# Patient Record
Sex: Male | Born: 1990 | Race: White | Hispanic: No | Marital: Single | Smoking: Former smoker
Health system: Southern US, Community
[De-identification: ages and names within clinical notes are randomized; demographics above are authoritative.]

## PROBLEM LIST (undated history)

## (undated) DIAGNOSIS — F32A Depression, unspecified: Secondary | ICD-10-CM

## (undated) DIAGNOSIS — F1990 Other psychoactive substance use, unspecified, uncomplicated: Secondary | ICD-10-CM

## (undated) DIAGNOSIS — F419 Anxiety disorder, unspecified: Secondary | ICD-10-CM

## (undated) HISTORY — DX: Anxiety disorder, unspecified: F41.9

## (undated) HISTORY — DX: Depression, unspecified: F32.A

## (undated) HISTORY — PX: SHOULDER SURGERY: SHX246

---

## 2018-06-29 ENCOUNTER — Encounter (HOSPITAL_COMMUNITY): Payer: Self-pay | Admitting: Emergency Medicine

## 2018-06-29 ENCOUNTER — Emergency Department (HOSPITAL_COMMUNITY)
Admission: EM | Admit: 2018-06-29 | Discharge: 2018-06-29 | Disposition: A | Discharge status: Home / Self Care | Payer: Self-pay | Attending: Emergency Medicine | Admitting: Emergency Medicine

## 2018-06-29 ENCOUNTER — Emergency Department (HOSPITAL_COMMUNITY): Payer: Self-pay

## 2018-06-29 DIAGNOSIS — R69 Illness, unspecified: Secondary | ICD-10-CM

## 2018-06-29 DIAGNOSIS — M7918 Myalgia, other site: Secondary | ICD-10-CM | POA: Insufficient documentation

## 2018-06-29 DIAGNOSIS — B9789 Other viral agents as the cause of diseases classified elsewhere: Secondary | ICD-10-CM

## 2018-06-29 DIAGNOSIS — R509 Fever, unspecified: Secondary | ICD-10-CM | POA: Insufficient documentation

## 2018-06-29 DIAGNOSIS — J111 Influenza due to unidentified influenza virus with other respiratory manifestations: Secondary | ICD-10-CM | POA: Insufficient documentation

## 2018-06-29 DIAGNOSIS — J069 Acute upper respiratory infection, unspecified: Secondary | ICD-10-CM | POA: Insufficient documentation

## 2018-06-29 MED ORDER — ACETAMINOPHEN 325 MG PO TABS
650.0000 mg | ORAL_TABLET | Freq: Once | ORAL | Status: AC
  Administered 2018-06-29: 650 mg via ORAL
  Filled 2018-06-29: qty 2

## 2018-06-29 MED ORDER — BENZONATATE 100 MG PO CAPS: 100.0000 mg | ORAL_CAPSULE | Freq: Three times a day (TID) | ORAL | 0 refills | Status: DC

## 2018-06-29 NOTE — Discharge Instructions (Signed)
Drink plenty of fluids. Take tylenol or motrin for pain or fever. Take tessalon for cough. Follow up with family doctor if not improving or return if worsening in 3-5 days.

## 2018-06-29 NOTE — ED Triage Notes (Signed)
Pt reports cough x1 week with cold sweats and body aches. Denies any N/V.

## 2018-06-29 NOTE — ED Notes (Signed)
Patient verbalizes understanding of discharge instructions. Opportunity for questioning and answers were provided. Armband removed by staff, pt discharged from ED ambulatory.   

## 2018-06-29 NOTE — ED Provider Notes (Signed)
[facility] Provider Note   CSN: [id number] Arrival date & time: 06/29/18  1429     History   Chief Complaint Chief Complaint  Patient presents with  . Cough    HPI Sheryn BisonGiancarlo Jayme CloudGonzalez is a 27 y.o. male.  HPI Sheryn BisonGiancarlo Jayme CloudGonzalez is a 27 y.o. male presents to emergency department with complaint of cough, body aches, chills.  Patient states that his symptoms began 1 week ago with mild cough.  He states gradually his cough has worsened and now he is having body aches, chills, sweats.  No recent travel.  Did not get his flu shot.  He states he has been taking Robitussin which has not helped.  He states that he does have chest tightness and some shortness of breath.  He states cough is nonproductive.  Denies any nasal congestion or sore throat.  No nausea, vomiting, diarrhea.  History reviewed. No pertinent past medical history.  There are no active problems to display for this patient.   History reviewed. No pertinent surgical history.      Home Medications    Prior to Admission medications   Not on File    Family History No family history on file.  Social History Social History   Tobacco Use  . Smoking status: Not on file  Substance Use Topics  . Alcohol use: Not on file  . Drug use: Not on file     Allergies   Patient has no allergy information on record.   Review of Systems Review of Systems  Constitutional: Positive for chills and fever.  HENT: Negative for congestion, rhinorrhea and sore throat.   Respiratory: Positive for cough and shortness of breath.   Cardiovascular: Negative for chest pain.  Gastrointestinal: Negative for abdominal pain, diarrhea, nausea and vomiting.  Skin: Negative for rash.     Physical Exam Updated Vital Signs BP (!) 147/105 (BP Location: Right Arm)   Pulse 94   Temp 99.9 F (37.7 C) (Oral)   Resp 17   SpO2 100%   Physical Exam Vitals signs and nursing note reviewed.    Constitutional:      General: He is not in acute distress.    Appearance: He is well-developed.  HENT:     Head: Normocephalic and atraumatic.  Eyes:     Conjunctiva/sclera: Conjunctivae normal.  Neck:     Musculoskeletal: Neck supple.  Cardiovascular:     Rate and Rhythm: Normal rate and regular rhythm.     Heart sounds: Normal heart sounds.  Pulmonary:     Effort: Pulmonary effort is normal. No respiratory distress.     Breath sounds: No wheezing or rales.  Abdominal:     General: Bowel sounds are normal. There is no distension.     Palpations: Abdomen is soft.     Tenderness: There is no abdominal tenderness. There is no rebound.  Skin:    General: Skin is warm and dry.  Neurological:     Mental Status: He is alert.      ED Treatments / Results  Labs (all labs ordered are listed, but only abnormal results are displayed) Labs Reviewed - No data to display  EKG None  Radiology No results found.  Procedures Procedures (including critical care time)  Medications Ordered in ED Medications - No data to display   Initial Impression / Assessment and Plan / ED Course  I have reviewed the triage vital signs and the nursing notes.  Pertinent labs & imaging  results that were available during my care of the patient were reviewed by me and considered in my medical decision making (see chart for details).     Patient in emergency department with cough, fever, chills, myalgias.  Did not get his flu shot this year.  I suspect patient may have influenza.  His symptoms have been going on for a week and are getting worse.  At this point in time I will get a chest x-ray to rule out pneumonia.  We will treat his low-grade fever with Tylenol.  3:13 PM Chest x-ray is negative.  Patient has flulike symptoms, discussed possibility of this being influenza versus a viral upper respiratory tract infection.  Discussed treatment as to symptomatic especially because patient is out of the  window for Tamiflu.  Discussed Tylenol and Motrin around-the-clock to keep his fever down.  Encouraged to drink plenty of fluids.  I will give him prescription for some Tessalon to take for cough.  Follow-up with family doctor return in 3 to 5 days.  Vitals:   06/29/18 1435  BP: (!) 147/105  Pulse: 94  Resp: 17  Temp: 99.9 F (37.7 C)  TempSrc: Oral  SpO2: 100%       Final Clinical Impressions(s) / ED Diagnoses   Final diagnoses:  Viral URI with cough  Influenza-like illness    ED Discharge Orders    None       Jaynie CrumbleKirichenko, Damacio Weisgerber, PA-C 06/29/18 1609    Tilden Fossaees, Elizabeth, MD 06/29/18 1729

## 2018-06-29 NOTE — ED Notes (Signed)
Patient transported to X-ray 

## 2018-07-06 HISTORY — PX: SHOULDER SURGERY: SHX246

## 2018-07-23 ENCOUNTER — Other Ambulatory Visit: Payer: Self-pay

## 2018-07-23 ENCOUNTER — Encounter (HOSPITAL_COMMUNITY): Payer: Self-pay

## 2018-07-23 ENCOUNTER — Emergency Department (HOSPITAL_COMMUNITY)
Admission: EM | Admit: 2018-07-23 | Discharge: 2018-07-23 | Disposition: A | Discharge status: Home / Self Care | Payer: Self-pay | Attending: Emergency Medicine | Admitting: Emergency Medicine

## 2018-07-23 DIAGNOSIS — Z23 Encounter for immunization: Secondary | ICD-10-CM | POA: Insufficient documentation

## 2018-07-23 DIAGNOSIS — Y999 Unspecified external cause status: Secondary | ICD-10-CM | POA: Insufficient documentation

## 2018-07-23 DIAGNOSIS — S61411A Laceration without foreign body of right hand, initial encounter: Secondary | ICD-10-CM | POA: Insufficient documentation

## 2018-07-23 DIAGNOSIS — Y93G1 Activity, food preparation and clean up: Secondary | ICD-10-CM | POA: Insufficient documentation

## 2018-07-23 DIAGNOSIS — W260XXA Contact with knife, initial encounter: Secondary | ICD-10-CM | POA: Insufficient documentation

## 2018-07-23 DIAGNOSIS — Y929 Unspecified place or not applicable: Secondary | ICD-10-CM | POA: Insufficient documentation

## 2018-07-23 MED ORDER — LIDOCAINE HCL (PF) 1 % IJ SOLN
10.0000 mL | Freq: Once | INTRAMUSCULAR | Status: AC
  Administered 2018-07-23: 10 mL via INTRADERMAL
  Filled 2018-07-23: qty 30

## 2018-07-23 MED ORDER — HYDROCODONE-ACETAMINOPHEN 5-325 MG PO TABS
1.0000 | ORAL_TABLET | Freq: Once | ORAL | Status: AC
  Administered 2018-07-23: 1 via ORAL
  Filled 2018-07-23: qty 1

## 2018-07-23 MED ORDER — TETANUS-DIPHTH-ACELL PERTUSSIS 5-2.5-18.5 LF-MCG/0.5 IM SUSP
0.5000 mL | Freq: Once | INTRAMUSCULAR | Status: AC
  Administered 2018-07-23: 0.5 mL via INTRAMUSCULAR
  Filled 2018-07-23: qty 0.5

## 2018-07-23 MED ORDER — CEPHALEXIN 500 MG PO CAPS: 500.0000 mg | ORAL_CAPSULE | Freq: Two times a day (BID) | ORAL | 0 refills | Status: AC

## 2018-07-23 MED ORDER — BACITRACIN ZINC 500 UNIT/GM EX OINT
TOPICAL_OINTMENT | Freq: Once | CUTANEOUS | Status: DC
  Filled 2018-07-23: qty 1.8

## 2018-07-23 NOTE — Discharge Instructions (Signed)
Keep the wound clean and dry for the first 24 hours. After that you may gently clean the wound with soap and water. Make sure to pat dry the wound before covering it with any dressing. You can use topical antibiotic ointment and bandage. Ice and elevate for pain relief.  ° °You can take Tylenol or Ibuprofen as directed for pain. You can alternate Tylenol and Ibuprofen every 4 hours for additional pain relief.  ° °Take antibiotics as directed. Please take all of your antibiotics until finished. ° °Return to the Emergency Department, your primary care doctor, or the [facility] in 5-7 days for suture removal.  ° °Monitor closely for any signs of infection. Return to the Emergency Department for any worsening redness/swelling of the area that begins to spread, drainage from the site, worsening pain, fever or any other worsening or concerning symptoms.  ° ° °

## 2018-07-23 NOTE — ED Provider Notes (Signed)
[facility]-EMERGENCY DEPT Provider Note   CSN: [id number] Arrival date & time: 07/23/18  0731     History   Chief Complaint Chief Complaint  Patient presents with  . Extremity Laceration    HPI Jared Jordan is a 28 y.o. male who presents for evaluation of laceration to right wrist that occurred last night at approximately 11 PM.  Patient reports that he was cleaning dishes and was cleaning a kitchen knife when it slipped, causing him to have a laceration noted to the volar aspect of his right wrist.  Patient reports that he does not want know when his last tetanus shot was.  Patient denies any numbness/weakness. Patient denies any SI.    The history is provided by the patient.    History reviewed. No pertinent past medical history.  There are no active problems to display for this patient.   Past Surgical History:  Procedure Laterality Date  . SHOULDER SURGERY          Home Medications    Prior to Admission medications   Medication Sig Start Date End Date Taking? Authorizing Provider  benzonatate (TESSALON) 100 MG capsule Take 1 capsule (100 mg total) by mouth every 8 (eight) hours. 06/29/18   Kirichenko, Tatyana, PA-C  cephALEXin (KEFLEX) 500 MG capsule Take 1 capsule (500 mg total) by mouth 2 (two) times daily for 7 days. 07/23/18 07/30/18  Maxwell Caul, PA-C    Family History History reviewed. No pertinent family history.  Social History Social History   Tobacco Use  . Smoking status: Never Smoker  . Smokeless tobacco: Never Used  Substance Use Topics  . Alcohol use: Yes    Comment: social  . Drug use: Never     Allergies   Patient has no known allergies.   Review of Systems Review of Systems  Skin: Positive for wound.  Neurological: Negative for weakness and numbness.  All other systems reviewed and are negative.    Physical Exam Updated Vital Signs BP (!) 153/95   Pulse 88   Temp 97.9 F (36.6 C) (Oral)    Resp 16   Ht 5\' 9"  (1.753 m)   Wt 86.2 kg   SpO2 98%   BMI 28.06 kg/m   Physical Exam Vitals signs and nursing note reviewed.  Constitutional:      Appearance: He is well-developed.  HENT:     Head: Normocephalic and atraumatic.  Eyes:     General: No scleral icterus.       Right eye: No discharge.        Left eye: No discharge.     Conjunctiva/sclera: Conjunctivae normal.  Cardiovascular:     Pulses:          Radial pulses are 2+ on the right side and 2+ on the left side.  Pulmonary:     Effort: Pulmonary effort is normal.  Musculoskeletal:     Comments: FROM of all 5 digits of right hand without any difficulty.  Skin:    General: Skin is warm and dry.     Capillary Refill: Capillary refill takes less than 2 seconds.     Comments: 2 cm jagged laceration noted to volar aspect of right wrist towards the ulnar aspect. Good distal cap refill. RUE is not dusky in appearance or cool to touch.  Neurological:     Mental Status: He is alert.  Psychiatric:        Speech: Speech normal.  Behavior: Behavior normal.      ED Treatments / Results  Labs (all labs ordered are listed, but only abnormal results are displayed) Labs Reviewed - No data to display  EKG None  Radiology No results found.  Procedures .Marland KitchenLaceration Repair Date/Time: 07/23/2018 8:58 AM Performed by: Maxwell Caul, PA-C Authorized by: Maxwell Caul, PA-C   Consent:    Consent obtained:  Verbal   Consent given by:  Patient   Risks discussed:  Infection, need for additional repair, pain, poor cosmetic result and poor wound healing   Alternatives discussed:  No treatment and delayed treatment Universal protocol:    Procedure explained and questions answered to patient or proxy's satisfaction: yes     Relevant documents present and verified: yes     Test results available and properly labeled: yes     Imaging studies available: yes     Required blood products, implants, devices, and  special equipment available: yes     Site/side marked: yes     Immediately prior to procedure, a time out was called: yes     Patient identity confirmed:  Verbally with patient Anesthesia (see MAR for exact dosages):    Anesthesia method:  Local infiltration   Local anesthetic:  Lidocaine 1% w/o epi Laceration details:    Location:  Hand   Hand location:  R palm   Length (cm):  2 Repair type:    Repair type:  Simple Pre-procedure details:    Preparation:  Patient was prepped and draped in usual sterile fashion Exploration:    Hemostasis achieved with:  Direct pressure   Wound extent: no tendon damage noted   Treatment:    Area cleansed with:  Betadine   Amount of cleaning:  Extensive   Irrigation solution:  Sterile saline   Irrigation method:  Syringe   Visualized foreign bodies/material removed: no   Skin repair:    Repair method:  Sutures   Suture size:  4-0   Suture material:  Nylon   Suture technique:  Simple interrupted   Number of sutures:  4 Post-procedure details:    Dressing:  Antibiotic ointment   Patient tolerance of procedure:  Tolerated well, no immediate complications Comments:     Once the wound was anesthetized, was thoroughly and extensively irrigated with sterile saline.  No evidence of foreign body, tendon damage.    (including critical care time)  Medications Ordered in ED Medications  bacitracin ointment (has no administration in time range)  Tdap (BOOSTRIX) injection 0.5 mL (0.5 mLs Intramuscular Given 07/23/18 0820)  lidocaine (PF) (XYLOCAINE) 1 % injection 10 mL (10 mLs Intradermal Given 07/23/18 0819)  HYDROcodone-acetaminophen (NORCO/VICODIN) 5-325 MG per tablet 1 tablet (1 tablet Oral Given 07/23/18 0819)     Initial Impression / Assessment and Plan / ED Course  I have reviewed the triage vital signs and the nursing notes.  Pertinent labs & imaging results that were available during my care of the patient were reviewed by me and considered in  my medical decision making (see chart for details).     28 year old male who presents for evaluation of laceration to right hand that occurred last night approxi-11 PM.  Patient reports he was washing dishes when the knife slipped.  Tetanus is not up-to-date. Patient is afebrile, non-toxic appearing, sitting comfortably on examination table. Vital signs reviewed and stable.  Patient is neurovascularly intact.  On exam, he has a 2 cm jagged laceration noted to the volar aspect of the  right wrist towards the ulnar aspect.  In for wound care update tetanus.  Laceration repaired as documented above.  Patient tolerated procedure well. Given that the wound has been open for almost 10 hours, will plan to start on abx.  Patient instructed on wound care. Patient had ample opportunity for questions and discussion. All patient's questions were answered with full understanding. Strict return precautions discussed. Patient expresses understanding and agreement to plan.    Portions of this note were generated with Scientist, clinical (histocompatibility and immunogenetics)Dragon dictation software. Dictation errors may occur despite best attempts at proofreading.   Final Clinical Impressions(s) / ED Diagnoses   Final diagnoses:  Laceration of right hand without foreign body, initial encounter    ED Discharge Orders         Ordered    cephALEXin (KEFLEX) 500 MG capsule  2 times daily     07/23/18 0857           Maxwell CaulLayden, Bret Vanessen A, PA-C 07/23/18 1059    Shaune PollackIsaacs, Cameron, MD 07/25/18 1103

## 2018-07-23 NOTE — ED Triage Notes (Signed)
Pt cut wrist last night at 11pm while washing dishes.  Cut on knife.  Continues to throb.  Bleeding controlled at this time.

## 2018-07-23 NOTE — ED Notes (Signed)
Bed: WTR6 Expected date:  Expected time:  Means of arrival:  Comments: 

## 2019-04-22 ENCOUNTER — Emergency Department (HOSPITAL_COMMUNITY)
Admission: EM | Admit: 2019-04-22 | Discharge: 2019-04-22 | Disposition: A | Discharge status: Home / Self Care | Payer: Self-pay | Attending: Emergency Medicine | Admitting: Emergency Medicine

## 2019-04-22 ENCOUNTER — Other Ambulatory Visit: Payer: Self-pay

## 2019-04-22 ENCOUNTER — Encounter (HOSPITAL_COMMUNITY): Payer: Self-pay | Admitting: Emergency Medicine

## 2019-04-22 DIAGNOSIS — Z79899 Other long term (current) drug therapy: Secondary | ICD-10-CM | POA: Insufficient documentation

## 2019-04-22 DIAGNOSIS — R002 Palpitations: Secondary | ICD-10-CM | POA: Insufficient documentation

## 2019-04-22 DIAGNOSIS — F1910 Other psychoactive substance abuse, uncomplicated: Secondary | ICD-10-CM | POA: Insufficient documentation

## 2019-04-22 NOTE — Discharge Instructions (Addendum)
Please return to the ED with any new or worsening symptoms.

## 2019-04-22 NOTE — ED Triage Notes (Addendum)
Patient states he relapsed on Heroin and methamphetamine around 9 pm. Patient states that he started feeling a little funny and took narcan. Patient coming to the ED to see if he is ok.

## 2019-04-22 NOTE — ED Provider Notes (Signed)
[place] DEPT Provider Note   CSN: [id number] Arrival date & time: 04/22/19  0232     History   Chief Complaint Chief Complaint  Patient presents with  . Drug Overdose    HPI Jared Jordan is a 28 y.o. male.     Patient to ED after using heroin and ICE IV around 9:00 pm with symptoms of heart racing, and anxiety. He states he has been using heroin over the last 2-3 days after 6 years of sobriety. Tonight was the first time he used crystal meth. He tried using Narcan after injecting without change in symptoms. He reports his symptoms have improved significantly since onset but he was worried and wanted reassurance he was going to be ok.   The history is provided by the patient. No language interpreter was used.  Drug Overdose Pertinent negatives include no chest pain, no headaches and no shortness of breath.    History reviewed. No pertinent past medical history.  There are no active problems to display for this patient.   Past Surgical History:  Procedure Laterality Date  . SHOULDER SURGERY          Home Medications    Prior to Admission medications   Medication Sig Start Date End Date Taking? Authorizing Provider  benzonatate (TESSALON) 100 MG capsule Take 1 capsule (100 mg total) by mouth every 8 (eight) hours. 06/29/18   Jeannett Senior, PA-C    Family History History reviewed. No pertinent family history.  Social History Social History   Tobacco Use  . Smoking status: Never Smoker  . Smokeless tobacco: Never Used  Substance Use Topics  . Alcohol use: Yes    Comment: social  . Drug use: Never     Allergies   Patient has no known allergies.   Review of Systems Review of Systems  Constitutional: Negative for chills and fever.  HENT: Negative.   Respiratory: Negative.  Negative for shortness of breath.   Cardiovascular: Positive for palpitations. Negative for chest pain.  Gastrointestinal: Positive for  nausea. Negative for vomiting.  Musculoskeletal: Negative.   Skin: Negative.   Neurological: Negative.  Negative for syncope, weakness and headaches.  Psychiatric/Behavioral: The patient is nervous/anxious.      Physical Exam Updated Vital Signs BP (!) 135/94 (BP Location: Left Arm)   Pulse 90   Temp 97.9 F (36.6 C) (Oral)   Resp 18   Ht 5\' 10"  (1.778 m)   Wt 79.4 kg   SpO2 96%   BMI 25.11 kg/m   Physical Exam Constitutional:      Appearance: He is well-developed.  HENT:     Head: Normocephalic.  Eyes:     Comments: Pupils equally dilated bilaterally, normally reactive.   Neck:     Musculoskeletal: Normal range of motion and neck supple.  Cardiovascular:     Rate and Rhythm: Normal rate and regular rhythm.  Pulmonary:     Effort: Pulmonary effort is normal.     Breath sounds: Normal breath sounds.  Abdominal:     General: Bowel sounds are normal.     Palpations: Abdomen is soft.     Tenderness: There is no abdominal tenderness. There is no guarding or rebound.  Musculoskeletal: Normal range of motion.  Skin:    General: Skin is warm and dry.     Findings: No rash.  Neurological:     Mental Status: He is alert and oriented to person, place, and time.  ED Treatments / Results  Labs (all labs ordered are listed, but only abnormal results are displayed) Labs Reviewed - No data to display  EKG None  Radiology No results found.  Procedures Procedures (including critical care time)  Medications Ordered in ED Medications - No data to display   Initial Impression / Assessment and Plan / ED Course  I have reviewed the triage vital signs and the nursing notes.  Pertinent labs & imaging results that were available during my care of the patient were reviewed by me and considered in my medical decision making (see chart for details).        Patient is overall well appearing after injecting methamphetamine around 9:00 pm. Feeling better now - less  anxiety and less palpations.   Injection sites are unremarkable, without evidence infection. Will observe over a one hour period to insure continued improvement.   On re-evaluation the patient continues to feel improved. No further symptoms. He is felt appropriate for discharge home.  Final Clinical Impressions(s) / ED Diagnoses   Final diagnoses:  None   1. polysubstance abuse 2. Palpitations   ED Discharge Orders    None       Elpidio Anis, PA-C 04/22/19 0440    Mesner, Barbara Cower, MD 04/22/19 [phone]

## 2019-04-22 NOTE — ED Notes (Signed)
Pt was verbalized discharge instructions. Pt had no further questions at this time. NAD. 

## 2019-06-14 ENCOUNTER — Emergency Department (HOSPITAL_BASED_OUTPATIENT_CLINIC_OR_DEPARTMENT_OTHER)
Admission: EM | Admit: 2019-06-14 | Discharge: 2019-06-14 | Disposition: A | Discharge status: Home / Self Care | Payer: Self-pay | Attending: Emergency Medicine | Admitting: Emergency Medicine

## 2019-06-14 ENCOUNTER — Other Ambulatory Visit: Payer: Self-pay

## 2019-06-14 ENCOUNTER — Encounter (HOSPITAL_BASED_OUTPATIENT_CLINIC_OR_DEPARTMENT_OTHER): Payer: Self-pay | Admitting: Emergency Medicine

## 2019-06-14 DIAGNOSIS — F1120 Opioid dependence, uncomplicated: Secondary | ICD-10-CM | POA: Insufficient documentation

## 2019-06-14 DIAGNOSIS — F17200 Nicotine dependence, unspecified, uncomplicated: Secondary | ICD-10-CM | POA: Insufficient documentation

## 2019-06-14 HISTORY — DX: Other psychoactive substance use, unspecified, uncomplicated: F19.90

## 2019-06-14 LAB — COMPREHENSIVE METABOLIC PANEL
ALT: 43 U/L (ref 0–44)
AST: 37 U/L (ref 15–41)
Albumin: 4.3 g/dL (ref 3.5–5.0)
Alkaline Phosphatase: 68 U/L (ref 38–126)
Anion gap: 7 (ref 5–15)
BUN: 13 mg/dL (ref 6–20)
CO2: 30 mmol/L (ref 22–32)
Calcium: 9.6 mg/dL (ref 8.9–10.3)
Chloride: 100 mmol/L (ref 98–111)
Creatinine, Ser: 0.79 mg/dL (ref 0.61–1.24)
GFR calc Af Amer: >60 mL/min (ref >60)
GFR calc non Af Amer: >60 mL/min (ref >60)
Glucose, Bld: 115 mg/dL — ABNORMAL HIGH (ref 70–99)
Potassium: 4.1 mmol/L (ref 3.5–5.1)
Sodium: 137 mmol/L (ref 135–145)
Total Bilirubin: 0.9 mg/dL (ref 0.3–1.2)
Total Protein: 7.2 g/dL (ref 6.5–8.1)

## 2019-06-14 LAB — CBC WITH DIFFERENTIAL/PLATELET
Abs Immature Granulocytes: 0.01 10*3/uL (ref 0.00–0.07)
Basophils Absolute: 0 10*3/uL (ref 0.0–0.1)
Basophils Relative: 1 %
Eosinophils Absolute: 0.1 10*3/uL (ref 0.0–0.5)
Eosinophils Relative: 1 %
HCT: 45 % (ref 39.0–52.0)
Hemoglobin: 14.7 g/dL (ref 13.0–17.0)
Immature Granulocytes: 0 %
Lymphocytes Relative: 25 %
Lymphs Abs: 2.1 10*3/uL (ref 0.7–4.0)
MCH: 29.2 pg (ref 26.0–34.0)
MCHC: 32.7 g/dL (ref 30.0–36.0)
MCV: 89.5 fL (ref 80.0–100.0)
Monocytes Absolute: 0.5 10*3/uL (ref 0.1–1.0)
Monocytes Relative: 6 %
Neutro Abs: 5.6 10*3/uL (ref 1.7–7.7)
Neutrophils Relative %: 67 %
Platelets: 217 10*3/uL (ref 150–400)
RBC: 5.03 MIL/uL (ref 4.22–5.81)
RDW: 13.1 % (ref 11.5–15.5)
WBC: 8.3 10*3/uL (ref 4.0–10.5)
nRBC: 0 % (ref 0.0–0.2)

## 2019-06-14 LAB — RAPID URINE DRUG SCREEN, HOSP PERFORMED
Amphetamines: POSITIVE — AB
Barbiturates: NOT DETECTED
Benzodiazepines: NOT DETECTED
Cocaine: NOT DETECTED
Opiates: NOT DETECTED
Tetrahydrocannabinol: NOT DETECTED

## 2019-06-14 LAB — ETHANOL: Alcohol, Ethyl (B): <10 mg/dL (ref <10)

## 2019-06-14 NOTE — ED Triage Notes (Signed)
PT states wants help with detox from IV heroin. Last used midnight.

## 2019-06-14 NOTE — ED Provider Notes (Signed)
[facility] Provider Note: Jared Dell, MD, FACEP  CSN: [id number] MRN: [id number] ARRIVAL: 06/14/19 at 0143 ROOM: [facility]09/[facility]09   CHIEF COMPLAINT  Addiction Problem   HISTORY OF PRESENT ILLNESS  06/14/19 1:57 AM Jared Jordan is a 28 y.o. male with a remote history of heroin abuse.  He started using again 2 months ago after his father died.  He states he is up to about a gram a day of heroin.  He injects IV.  He is here wanting help getting off heroin.  He denies any suicidal or homicidal ideation.  He denies any acute somatic complaints.  He last used 2 hours ago.  He sometimes mixes it with methamphetamine.   Past Medical History:  Diagnosis Date  . Drug use     Past Surgical History:  Procedure Laterality Date  . SHOULDER SURGERY      No family history on file.  Social History   Tobacco Use  . Smoking status: Current Every Day Smoker  . Smokeless tobacco: Never Used  Substance Use Topics  . Alcohol use: Yes    Alcohol/week: 1.0 standard drinks    Types: 1 Cans of beer per week    Comment: social  . Drug use: Yes    Types: IV, Methamphetamines    Comment: using 1gm everyday heroin    Prior to Admission medications   Medication Sig Start Date End Date Taking? Authorizing Provider  benzonatate (TESSALON) 100 MG capsule Take 1 capsule (100 mg total) by mouth every 8 (eight) hours. Patient not taking: Reported on 04/22/2019 06/29/18   Jaynie Crumble, PA-C    Allergies Patient has no known allergies.   REVIEW OF SYSTEMS  Negative except as noted here or in the History of Present Illness.   PHYSICAL EXAMINATION  Initial Vital Signs Blood pressure (!) 145/89, pulse (!) 107, temperature 98.2 F (36.8 C), temperature source Oral, resp. rate (!) 22, height 5\' 10"  (1.778 m), weight 65.8 kg, SpO2 100 %.  Examination General: Well-developed, well-nourished male in no acute distress; appearance consistent with age of record HENT:  normocephalic; atraumatic Eyes: pupils equal, round and reactive to light; extraocular muscles intact Neck: supple Heart: regular rate and rhythm; tachycardia Lungs: clear to auscultation bilaterally Abdomen: soft; nondistended; nontender; bowel sounds present Extremities: No deformity; full range of motion; pulses normal Neurologic: Awake, alert and oriented; motor function intact in all extremities and symmetric; no facial droop Skin: Warm and dry; track marks right antecubital region Psychiatric: Normal mood and affect   RESULTS  Summary of this visit's results, reviewed and interpreted by myself:   EKG Interpretation  Date/Time:  Wednesday June 14 2019 01:53:47 EST Ventricular Rate:  110 PR Interval:    QRS Duration: 79 QT Interval:  318 QTC Calculation: 431 R Axis:   86 Text Interpretation: Sinus tachycardia Right atrial enlargement No previous ECGs available Confirmed by Huxley Shurley (05-22-1997) on 06/14/2019 2:05:15 AM      Laboratory Studies: Results for orders placed or performed during the hospital encounter of 06/14/19 (from the past 24 hour(s))  Ethanol     Status: None   Collection Time: 06/14/19  2:08 AM  Result Value Ref Range   Alcohol, Ethyl (B) <10 <10 mg/dL  Comprehensive metabolic panel     Status: Abnormal   Collection Time: 06/14/19  2:08 AM  Result Value Ref Range   Sodium 137 135 - 145 mmol/L   Potassium 4.1 3.5 - 5.1 mmol/L   Chloride 100 98 -  111 mmol/L   CO2 30 22 - 32 mmol/L   Glucose, Bld 115 (H) 70 - 99 mg/dL   BUN 13 6 - 20 mg/dL   Creatinine, Ser 0.79 0.61 - 1.24 mg/dL   Calcium 9.6 8.9 - 10.3 mg/dL   Total Protein 7.2 6.5 - 8.1 g/dL   Albumin 4.3 3.5 - 5.0 g/dL   AST 37 15 - 41 U/L   ALT 43 0 - 44 U/L   Alkaline Phosphatase 68 38 - 126 U/L   Total Bilirubin 0.9 0.3 - 1.2 mg/dL   GFR calc non Af Amer >60 >60 mL/min   GFR calc Af Amer >60 >60 mL/min   Anion gap 7 5 - 15  CBC with Differential     Status: None   Collection Time:  06/14/19  2:08 AM  Result Value Ref Range   WBC 8.3 4.0 - 10.5 K/uL   RBC 5.03 4.22 - 5.81 MIL/uL   Hemoglobin 14.7 13.0 - 17.0 g/dL   HCT 45.0 39.0 - 52.0 %   MCV 89.5 80.0 - 100.0 fL   MCH 29.2 26.0 - 34.0 pg   MCHC 32.7 30.0 - 36.0 g/dL   RDW 13.1 11.5 - 15.5 %   Platelets 217 150 - 400 K/uL   nRBC 0.0 0.0 - 0.2 %   Neutrophils Relative % 67 %   Neutro Abs 5.6 1.7 - 7.7 K/uL   Lymphocytes Relative 25 %   Lymphs Abs 2.1 0.7 - 4.0 K/uL   Monocytes Relative 6 %   Monocytes Absolute 0.5 0.1 - 1.0 K/uL   Eosinophils Relative 1 %   Eosinophils Absolute 0.1 0.0 - 0.5 K/uL   Basophils Relative 1 %   Basophils Absolute 0.0 0.0 - 0.1 K/uL   Immature Granulocytes 0 %   Abs Immature Granulocytes 0.01 0.00 - 0.07 K/uL  Rapid urine drug screen (hospital performed)     Status: Abnormal   Collection Time: 06/14/19  3:50 AM  Result Value Ref Range   Opiates NONE DETECTED NONE DETECTED   Cocaine NONE DETECTED NONE DETECTED   Benzodiazepines NONE DETECTED NONE DETECTED   Amphetamines POSITIVE (A) NONE DETECTED   Tetrahydrocannabinol NONE DETECTED NONE DETECTED   Barbiturates NONE DETECTED NONE DETECTED   Imaging Studies: No results found.  ED COURSE and MDM  Nursing notes, initial and subsequent vitals signs, including pulse oximetry, reviewed and interpreted by myself.  Vitals:   06/14/19 0153  BP: (!) 145/89  Pulse: (!) 107  Resp: (!) 22  Temp: 98.2 F (36.8 C)  TempSrc: Oral  SpO2: 100%  Weight: 65.8 kg  Height: 5\' 10"  (1.778 m)   Medications - No data to display  We will provide outpatient resources to patient.  PROCEDURES  Procedures   ED DIAGNOSES     ICD-10-CM   1. Heroin dependence ([place])  F11.20        Avika Carbine, Jenny Reichmann, MD 06/14/19 [phone]

## 2019-08-28 ENCOUNTER — Encounter (HOSPITAL_COMMUNITY): Payer: Self-pay | Admitting: Emergency Medicine

## 2019-08-28 ENCOUNTER — Other Ambulatory Visit: Payer: Self-pay

## 2019-08-28 ENCOUNTER — Emergency Department (HOSPITAL_COMMUNITY)
Admission: EM | Admit: 2019-08-28 | Discharge: 2019-08-29 | Disposition: A | Discharge status: Home / Self Care | Payer: Self-pay | Attending: Emergency Medicine | Admitting: Emergency Medicine

## 2019-08-28 DIAGNOSIS — Z20822 Contact with and (suspected) exposure to covid-19: Secondary | ICD-10-CM | POA: Insufficient documentation

## 2019-08-28 DIAGNOSIS — F17200 Nicotine dependence, unspecified, uncomplicated: Secondary | ICD-10-CM | POA: Insufficient documentation

## 2019-08-28 DIAGNOSIS — F1914 Other psychoactive substance abuse with psychoactive substance-induced mood disorder: Secondary | ICD-10-CM | POA: Insufficient documentation

## 2019-08-28 DIAGNOSIS — F1910 Other psychoactive substance abuse, uncomplicated: Secondary | ICD-10-CM

## 2019-08-28 DIAGNOSIS — Z79899 Other long term (current) drug therapy: Secondary | ICD-10-CM | POA: Insufficient documentation

## 2019-08-28 DIAGNOSIS — R45851 Suicidal ideations: Secondary | ICD-10-CM | POA: Insufficient documentation

## 2019-08-28 DIAGNOSIS — F1110 Opioid abuse, uncomplicated: Secondary | ICD-10-CM | POA: Insufficient documentation

## 2019-08-28 LAB — COMPREHENSIVE METABOLIC PANEL
ALT: 57 U/L — ABNORMAL HIGH (ref 0–44)
AST: 62 U/L — ABNORMAL HIGH (ref 15–41)
Albumin: 4 g/dL (ref 3.5–5.0)
Alkaline Phosphatase: 78 U/L (ref 38–126)
Anion gap: 11 (ref 5–15)
BUN: 11 mg/dL (ref 6–20)
CO2: 26 mmol/L (ref 22–32)
Calcium: 9.3 mg/dL (ref 8.9–10.3)
Chloride: 101 mmol/L (ref 98–111)
Creatinine, Ser: 0.93 mg/dL (ref 0.61–1.24)
GFR calc Af Amer: >60 mL/min (ref >60)
GFR calc non Af Amer: >60 mL/min (ref >60)
Glucose, Bld: 120 mg/dL — ABNORMAL HIGH (ref 70–99)
Potassium: 3.9 mmol/L (ref 3.5–5.1)
Sodium: 138 mmol/L (ref 135–145)
Total Bilirubin: 0.8 mg/dL (ref 0.3–1.2)
Total Protein: 7 g/dL (ref 6.5–8.1)

## 2019-08-28 LAB — CBC
HCT: 40.8 % (ref 39.0–52.0)
Hemoglobin: 13.7 g/dL (ref 13.0–17.0)
MCH: 30 pg (ref 26.0–34.0)
MCHC: 33.6 g/dL (ref 30.0–36.0)
MCV: 89.5 fL (ref 80.0–100.0)
Platelets: 231 10*3/uL (ref 150–400)
RBC: 4.56 MIL/uL (ref 4.22–5.81)
RDW: 13 % (ref 11.5–15.5)
WBC: 5.9 10*3/uL (ref 4.0–10.5)
nRBC: 0 % (ref 0.0–0.2)

## 2019-08-28 LAB — ETHANOL: Alcohol, Ethyl (B): <10 mg/dL (ref <10)

## 2019-08-28 LAB — RAPID URINE DRUG SCREEN, HOSP PERFORMED
Amphetamines: POSITIVE — AB
Barbiturates: NOT DETECTED
Benzodiazepines: NOT DETECTED
Cocaine: NOT DETECTED
Opiates: POSITIVE — AB
Tetrahydrocannabinol: NOT DETECTED

## 2019-08-28 LAB — RESPIRATORY PANEL BY RT PCR (FLU A&B, COVID)
Influenza A by PCR: NEGATIVE
Influenza B by PCR: NEGATIVE
SARS Coronavirus 2 by RT PCR: NEGATIVE

## 2019-08-28 LAB — ACETAMINOPHEN LEVEL: Acetaminophen (Tylenol), Serum: <10 ug/mL — ABNORMAL LOW (ref 10–30)

## 2019-08-28 LAB — SALICYLATE LEVEL: Salicylate Lvl: <7 mg/dL — ABNORMAL LOW (ref 7.0–30.0)

## 2019-08-28 MED ORDER — ONDANSETRON 4 MG PO TBDP: 4.0000 mg | ORAL_TABLET | Freq: Four times a day (QID) | ORAL | Status: DC | PRN

## 2019-08-28 MED ORDER — LOPERAMIDE HCL 2 MG PO CAPS: 2.0000 mg | ORAL_CAPSULE | ORAL | Status: DC | PRN

## 2019-08-28 MED ORDER — BUPRENORPHINE HCL-NALOXONE HCL 8-2 MG SL SUBL
1.0000 | SUBLINGUAL_TABLET | Freq: Every day | SUBLINGUAL | Status: DC
  Administered 2019-08-28 – 2019-08-29 (×2): 1 via SUBLINGUAL
  Filled 2019-08-28 (×2): qty 1

## 2019-08-28 MED ORDER — CLONIDINE HCL 0.2 MG PO TABS: 0.1000 mg | ORAL_TABLET | Freq: Every day | ORAL | Status: DC

## 2019-08-28 MED ORDER — CLONIDINE HCL 0.2 MG PO TABS
0.1000 mg | ORAL_TABLET | Freq: Four times a day (QID) | ORAL | Status: DC
  Administered 2019-08-28 – 2019-08-29 (×3): 0.1 mg via ORAL
  Filled 2019-08-28 (×3): qty 1

## 2019-08-28 MED ORDER — HYDROXYZINE HCL 50 MG PO TABS
50.0000 mg | ORAL_TABLET | Freq: Two times a day (BID) | ORAL | Status: DC
  Administered 2019-08-28 – 2019-08-29 (×2): 50 mg via ORAL
  Filled 2019-08-28 (×2): qty 1

## 2019-08-28 MED ORDER — NAPROXEN 250 MG PO TABS: 500.0000 mg | ORAL_TABLET | Freq: Two times a day (BID) | ORAL | Status: DC | PRN

## 2019-08-28 MED ORDER — DICYCLOMINE HCL 20 MG PO TABS: 20.0000 mg | ORAL_TABLET | Freq: Four times a day (QID) | ORAL | Status: DC | PRN

## 2019-08-28 MED ORDER — HYDROXYZINE HCL 25 MG PO TABS
25.0000 mg | ORAL_TABLET | Freq: Four times a day (QID) | ORAL | Status: DC | PRN
  Administered 2019-08-28: 18:00:00 25 mg via ORAL
  Filled 2019-08-28: qty 1

## 2019-08-28 MED ORDER — CLONIDINE HCL 0.2 MG PO TABS: 0.1000 mg | ORAL_TABLET | Freq: Two times a day (BID) | ORAL | Status: DC

## 2019-08-28 MED ORDER — TRAZODONE HCL 50 MG PO TABS
50.0000 mg | ORAL_TABLET | Freq: Every day | ORAL | Status: DC
  Administered 2019-08-28: 50 mg via ORAL
  Filled 2019-08-28: qty 1

## 2019-08-28 MED ORDER — METHOCARBAMOL 500 MG PO TABS: 500.0000 mg | ORAL_TABLET | Freq: Three times a day (TID) | ORAL | Status: DC | PRN

## 2019-08-28 NOTE — ED Provider Notes (Signed)
[facility] EMERGENCY DEPARTMENT Provider Note   CSN: [id number] Arrival date & time: 08/28/19  1418     History Chief Complaint  Patient presents with  . Suicidal    Jared Jordan is a 29 y.o. male.  HPI    Patient presents to the ED for evaluation of depression and suicidal ideation.  Patient states he has had a lot of stressors in his life over the past year.  His father passed away.  He lost his job.  He had a bad break-up.  He also stopped going to school.  Patient has been abusing alcohol.  He also started using heroin.  He has been using a gram a day.  His last use was yesterday.  Patient has had increasing thoughts of suicidality.  Patient needs some help and wants to stop using drugs.  Past Medical History:  Diagnosis Date  . Drug use     There are no problems to display for this patient.   Past Surgical History:  Procedure Laterality Date  . SHOULDER SURGERY         History reviewed. No pertinent family history.  Social History   Tobacco Use  . Smoking status: Current Every Day Smoker  . Smokeless tobacco: Never Used  Substance Use Topics  . Alcohol use: Yes    Alcohol/week: 1.0 standard drinks    Types: 1 Cans of beer per week    Comment: social  . Drug use: Yes    Types: IV, Methamphetamines    Comment: using 1gm everyday heroin    Home Medications Prior to Admission medications   Medication Sig Start Date End Date Taking? Authorizing Provider  buprenorphine-naloxone (SUBOXONE) 8-2 mg SUBL SL tablet Place 1 tablet under the tongue daily.   Yes [provider]  hydrOXYzine (ATARAX/VISTARIL) 50 MG tablet Take 50 mg by mouth in the morning and at bedtime.   Yes [provider]  traZODone (DESYREL) 50 MG tablet Take 50 mg by mouth at bedtime.   Yes [provider]    Allergies    Patient has no known allergies.  Review of Systems   Review of Systems  All other systems reviewed and are  negative.   Physical Exam Updated Vital Signs BP 97/60 (BP Location: Left Arm)   Pulse (!) 54   Temp 97.9 F (36.6 C) (Oral)   Resp 18   Ht 1.778 m (5\' 10" )   Wt 77.1 kg   SpO2 99%   BMI 24.39 kg/m   Physical Exam Vitals and nursing note reviewed.  Constitutional:      General: He is not in acute distress.    Appearance: He is well-developed.  HENT:     Head: Normocephalic and atraumatic.     Right Ear: External ear normal.     Left Ear: External ear normal.  Eyes:     General: No scleral icterus.       Right eye: No discharge.        Left eye: No discharge.     Conjunctiva/sclera: Conjunctivae normal.  Neck:     Trachea: No tracheal deviation.  Cardiovascular:     Rate and Rhythm: Normal rate and regular rhythm.  Pulmonary:     Effort: Pulmonary effort is normal. No respiratory distress.     Breath sounds: Normal breath sounds. No stridor. No wheezing or rales.  Abdominal:     General: Bowel sounds are normal. There is no distension.  Palpations: Abdomen is soft.     Tenderness: There is no abdominal tenderness. There is no guarding or rebound.  Musculoskeletal:        General: No tenderness.     Cervical back: Neck supple.  Skin:    General: Skin is warm and dry.     Findings: No rash.  Neurological:     Mental Status: He is alert.     Cranial Nerves: No cranial nerve deficit (no facial droop, extraocular movements intact, no slurred speech).     Sensory: No sensory deficit.     Motor: No abnormal muscle tone or seizure activity.     Coordination: Coordination normal.  Psychiatric:        Mood and Affect: Mood is depressed.        Speech: Speech is not delayed or tangential.        Behavior: Behavior is withdrawn.        Thought Content: Thought content includes suicidal ideation.     ED Results / Procedures / Treatments   Labs (all labs ordered are listed, but only abnormal results are displayed) Labs Reviewed  COMPREHENSIVE METABOLIC PANEL -  Abnormal; Notable for the following components:      Result Value   Glucose, Bld 120 (*)    AST 62 (*)    ALT 57 (*)    All other components within normal limits  SALICYLATE LEVEL - Abnormal; Notable for the following components:   Salicylate Lvl <5.6 (*)    All other components within normal limits  ACETAMINOPHEN LEVEL - Abnormal; Notable for the following components:   Acetaminophen (Tylenol), Serum <10 (*)    All other components within normal limits  RAPID URINE DRUG SCREEN, HOSP PERFORMED - Abnormal; Notable for the following components:   Opiates POSITIVE (*)    Amphetamines POSITIVE (*)    All other components within normal limits  RESPIRATORY PANEL BY RT PCR (FLU A&B, COVID)  ETHANOL  CBC    EKG None  Radiology No results found.  Procedures Procedures (including critical care time)  Medications Ordered in ED Medications  cloNIDine (CATAPRES) tablet 0.1 mg (0.1 mg Oral Given 08/29/19 0925)    Followed by  cloNIDine (CATAPRES) tablet 0.1 mg (has no administration in time range)    Followed by  cloNIDine (CATAPRES) tablet 0.1 mg (has no administration in time range)  dicyclomine (BENTYL) tablet 20 mg (has no administration in time range)  hydrOXYzine (ATARAX/VISTARIL) tablet 25 mg (25 mg Oral Given 08/28/19 1807)  loperamide (IMODIUM) capsule 2-4 mg (has no administration in time range)  methocarbamol (ROBAXIN) tablet 500 mg (has no administration in time range)  naproxen (NAPROSYN) tablet 500 mg (has no administration in time range)  ondansetron (ZOFRAN-ODT) disintegrating tablet 4 mg (has no administration in time range)  buprenorphine-naloxone (SUBOXONE) 8-2 mg per SL tablet 1 tablet (1 tablet Sublingual Given 08/29/19 0925)  hydrOXYzine (ATARAX/VISTARIL) tablet 50 mg (50 mg Oral Given 08/29/19 0926)  traZODone (DESYREL) tablet 50 mg (50 mg Oral Given 08/28/19 2312)    ED Course  I have reviewed the triage vital signs and the nursing notes.  Pertinent labs &  imaging results that were available during my care of the patient were reviewed by me and considered in my medical decision making (see chart for details).  The patient has been placed in psychiatric observation due to the need to provide a safe environment for the patient while obtaining psychiatric consultation and evaluation, as well as ongoing  medical and medication management to treat the patient's condition.  The patient has not been placed under full IVC at this time. Clinical Course as of Aug 28 1244  Mon Aug 28, 2019  1517 Pt is concerned about possible withdrawal sx.  Catapres detox protocol ordered.   [JK]    Clinical Course User Index [JK] Linwood Dibbles, MD   MDM Rules/Calculators/A&P                      Pt presents for SI, substance abuse.  Medically cleared for psych evaluation. Final Clinical Impression(s) / ED Diagnoses Final diagnoses:  Polysubstance abuse (HCC)  Suicidal thoughts    Rx / DC Orders ED Discharge Orders    None       Linwood Dibbles, MD 08/29/19 1249

## 2019-08-28 NOTE — ED Notes (Signed)
Pt requesting Suboxone. Informed pt pharmacy needs to verify first and then I will be able to administer.

## 2019-08-28 NOTE — ED Notes (Signed)
Pt placed in burgundy scrubs, belongings in 4 bags

## 2019-08-28 NOTE — ED Notes (Signed)
Does admit to 2 weeks clean off drugs with recent relapse.  TTS in room at this time.
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
## 2019-08-28 NOTE — ED Triage Notes (Addendum)
Pt reports suicidal thoughts over the last year. Pt reports loss of job, bad breakup, stopping school as stressful activating events. Reports several attempts to harm self over the last year via jumping off flight of stairs. Denies A/V hallucinations. Calm, cooperative at present. Denies plan at present. VSS. Using heroin. Last use yesterday

## 2019-08-29 NOTE — ED Notes (Signed)
Lunch has arrived ?

## 2019-08-29 NOTE — ED Notes (Signed)
Lunch Tray Ordered @ 1021. 

## 2019-08-29 NOTE — ED Notes (Addendum)
Pt is endorsing SI at the time of discharge. EDP informed. PT's dispo status remains unchanged. RN offered [name] in [place] with uber ride per SW. RN explained that per their intake person, he could only stay 23 hours. He declined stating "it was just a temporary solution and he did not want to be stuck in [place] tomorrow." RN advised he can use that time to get a plan. RN pointed out the resources we have given him and his peer support counselor's card. SW, Okey Regal, at bedside to also reinforce this. He left at this time without resources and states he is heading to [place].

## 2019-08-29 NOTE — Progress Notes (Signed)
Patient ID: Jared Jordan, male   DOB: [birth year], 29 y.o.   MRN: [id number]   Psychiatric reassessment   In brief; Jared Jordan is a single 29 y.o. male who presented voluntarily to [facility]. Pt is reported opioid abuse, & symptoms of depression with suicidal ideation  During this evaluation, he is alert and oriented x4, calm and cooperative. He he reports that he has been struggling with heroin abuse since the age of 89. Stated at one point, he maintained sobriety for 5 years although he recently relapsed after being clean for 3 weeks Reports his last use was the day prior to his ED admission. He added that he uses crystal meth occasionally. He states that he has lost jobs along with relationships due to his poor decisions and drug use. States he is now homeless after he was accused of using drugs at one halfway house, was switched to another one, and decided to leave because it wasn't a good environment. He reports passive SI without plan or intent. He denies HI or AVH. Reports no prior psychiatric hospitalizations, psychotropic medications or counseling. Reports he is prescribed r suboxone & vistaril. Reports multiple suicide attempts int he past with most recent a few days ago where he made an attempt to jump off second story balcony. He is requesting a long-term placement for his mental health, substance abuse, and housing needs.   Disposition: Patient endorses passive SI. He denies current plan or intent.  His main stressors are substance abuse and homelessness and he is requesting long-term placement for his mental health, substance abuse, and housing needs. He does not meet criteria for inpatient psychiatric hospitalization as such, he is psychiatrically cleared. CSW will fax over resources to met the above needs including resources for [employer].   ED updated on current disposition

## 2019-08-29 NOTE — ED Notes (Signed)
Voluntary- SI Breakfast ordered

## 2019-08-29 NOTE — Patient Outreach (Signed)
CPSS met with the patient in order to provide substance use recovery support and provide information for substance use recovery resources. Patient reports a history of daily heroin use. Patient relapsed on Friday 08/25/19 after three weeks of consecutive sobriety. Patient was living in a sober living house, but patient decided to leave this residence due to this recovery house not being helpful for his substance use recovery process. Patient is interested in getting connected to a residential substance use treatment center. CPSS talked to the patient about [facility] and [name] as possible options for residential substance use treatment and provided follow up information for both these residential substance use treatment centers as well as follow up information for other residential substance use treatment centers across [place]. CPSS also provided the patient with information for several additional substance use recovery resources including detox treatment center list, outpatient substance use treatment center list, [place] online/in-person NA meeting list, [place] [facility] vacancy list/flier detailing [facility] services, and CPSS contact information. CPSS strongly encouraged the patient to follow up with CPSS if needed for further help with getting connected to substance use recovery resources or any other help with [facility]PSS substance use recovery outreach services.  

## 2019-08-29 NOTE — Progress Notes (Signed)
CSW received call from RN stating this patient was psychiatrically and medically cleared for discharge. Patient was offered assistance in getting to [facility] in [place] for temporary services at [facility] by his nurse Lanora Manis. CSW spoke with patient at bedside to facilitate discharge to the [facility] but patient refused to go. Patient endorsed suicidal thoughts, EDP was made aware. Patient reported his desire for residential mental health treatment and stated that he was not getting the help he needed at [facility]. Patient stated he wanted to go to the ED in [facility]. CSW arranged for transportation via the Transportation Department to [facility] in [place].  Edwin Dada, MSW, LCSW-A Transitions of Care  Clinical Social Worker  [facility] Emergency Departments  Medical ICU [phone] \

## 2019-08-29 NOTE — Discharge Instructions (Signed)
You can follow-up with the resources for long-term residential options for your substance abuse and homelessness.

## 2019-08-29 NOTE — ED Provider Notes (Signed)
Patient seen by psychiatry this morning.  He does not meet psychiatric arteria for admission.  However he does have stressors including homelessness and ongoing substance abuse.  He was given resources including the [employer].  At this time patient is clear for discharge.   Gwyneth Sprout, MD 08/29/19 1215

## 2019-11-21 ENCOUNTER — Inpatient Hospital Stay (HOSPITAL_COMMUNITY)
Admission: AD | Admit: 2019-11-21 | Discharge: 2019-11-27 | DRG: 897 | Disposition: A | Discharge status: Home / Self Care | Payer: Federal, State, Local not specified - Other | Source: Intra-hospital | Attending: Psychiatry | Admitting: Psychiatry

## 2019-11-21 ENCOUNTER — Emergency Department (HOSPITAL_COMMUNITY)
Admission: EM | Admit: 2019-11-21 | Discharge: 2019-11-21 | Disposition: A | Discharge status: Other Acute Inpatient Hospital | Payer: Self-pay | Attending: Emergency Medicine | Admitting: Emergency Medicine

## 2019-11-21 ENCOUNTER — Other Ambulatory Visit: Payer: Self-pay

## 2019-11-21 ENCOUNTER — Encounter (HOSPITAL_COMMUNITY): Payer: Self-pay | Admitting: Psychiatry

## 2019-11-21 ENCOUNTER — Encounter (HOSPITAL_COMMUNITY): Payer: Self-pay | Admitting: Emergency Medicine

## 2019-11-21 ENCOUNTER — Other Ambulatory Visit: Payer: Self-pay | Admitting: Behavioral Health

## 2019-11-21 DIAGNOSIS — F1020 Alcohol dependence, uncomplicated: Secondary | ICD-10-CM | POA: Diagnosis present

## 2019-11-21 DIAGNOSIS — F1114 Opioid abuse with opioid-induced mood disorder: Principal | ICD-10-CM | POA: Diagnosis present

## 2019-11-21 DIAGNOSIS — F17210 Nicotine dependence, cigarettes, uncomplicated: Secondary | ICD-10-CM | POA: Diagnosis present

## 2019-11-21 DIAGNOSIS — F1994 Other psychoactive substance use, unspecified with psychoactive substance-induced mood disorder: Secondary | ICD-10-CM | POA: Diagnosis present

## 2019-11-21 DIAGNOSIS — F419 Anxiety disorder, unspecified: Secondary | ICD-10-CM | POA: Diagnosis present

## 2019-11-21 DIAGNOSIS — Z79899 Other long term (current) drug therapy: Secondary | ICD-10-CM | POA: Insufficient documentation

## 2019-11-21 DIAGNOSIS — Z915 Personal history of self-harm: Secondary | ICD-10-CM

## 2019-11-21 DIAGNOSIS — F1910 Other psychoactive substance abuse, uncomplicated: Secondary | ICD-10-CM | POA: Insufficient documentation

## 2019-11-21 DIAGNOSIS — R45851 Suicidal ideations: Secondary | ICD-10-CM | POA: Diagnosis present

## 2019-11-21 DIAGNOSIS — G4700 Insomnia, unspecified: Secondary | ICD-10-CM | POA: Diagnosis present

## 2019-11-21 DIAGNOSIS — F332 Major depressive disorder, recurrent severe without psychotic features: Secondary | ICD-10-CM | POA: Insufficient documentation

## 2019-11-21 DIAGNOSIS — F329 Major depressive disorder, single episode, unspecified: Secondary | ICD-10-CM | POA: Diagnosis present

## 2019-11-21 DIAGNOSIS — Z20822 Contact with and (suspected) exposure to covid-19: Secondary | ICD-10-CM | POA: Insufficient documentation

## 2019-11-21 LAB — COMPREHENSIVE METABOLIC PANEL
ALT: 54 U/L — ABNORMAL HIGH (ref 0–44)
AST: 26 U/L (ref 15–41)
Albumin: 4 g/dL (ref 3.5–5.0)
Alkaline Phosphatase: 93 U/L (ref 38–126)
Anion gap: 10 (ref 5–15)
BUN: 8 mg/dL (ref 6–20)
CO2: 27 mmol/L (ref 22–32)
Calcium: 9.8 mg/dL (ref 8.9–10.3)
Chloride: 102 mmol/L (ref 98–111)
Creatinine, Ser: 0.75 mg/dL (ref 0.61–1.24)
GFR calc Af Amer: >60 mL/min (ref >60)
GFR calc non Af Amer: >60 mL/min (ref >60)
Glucose, Bld: 83 mg/dL (ref 70–99)
Potassium: 4.8 mmol/L (ref 3.5–5.1)
Sodium: 139 mmol/L (ref 135–145)
Total Bilirubin: 0.6 mg/dL (ref 0.3–1.2)
Total Protein: 7.3 g/dL (ref 6.5–8.1)

## 2019-11-21 LAB — RAPID URINE DRUG SCREEN, HOSP PERFORMED
Amphetamines: NOT DETECTED
Barbiturates: NOT DETECTED
Benzodiazepines: NOT DETECTED
Cocaine: NOT DETECTED
Opiates: NOT DETECTED
Tetrahydrocannabinol: NOT DETECTED

## 2019-11-21 LAB — CBC
HCT: 47.2 % (ref 39.0–52.0)
Hemoglobin: 15.3 g/dL (ref 13.0–17.0)
MCH: 28.9 pg (ref 26.0–34.0)
MCHC: 32.4 g/dL (ref 30.0–36.0)
MCV: 89.1 fL (ref 80.0–100.0)
Platelets: 285 10*3/uL (ref 150–400)
RBC: 5.3 MIL/uL (ref 4.22–5.81)
RDW: 13.2 % (ref 11.5–15.5)
WBC: 8.4 10*3/uL (ref 4.0–10.5)
nRBC: 0 % (ref 0.0–0.2)

## 2019-11-21 LAB — SALICYLATE LEVEL: Salicylate Lvl: <7 mg/dL — ABNORMAL LOW (ref 7.0–30.0)

## 2019-11-21 LAB — ACETAMINOPHEN LEVEL: Acetaminophen (Tylenol), Serum: <10 ug/mL — ABNORMAL LOW (ref 10–30)

## 2019-11-21 LAB — SARS CORONAVIRUS 2 BY RT PCR (HOSPITAL ORDER, PERFORMED IN ~~LOC~~ HOSPITAL LAB): SARS Coronavirus 2: NEGATIVE

## 2019-11-21 LAB — ETHANOL: Alcohol, Ethyl (B): <10 mg/dL (ref <10)

## 2019-11-21 MED ORDER — ONDANSETRON 4 MG PO TBDP: 4.0000 mg | ORAL_TABLET | Freq: Four times a day (QID) | ORAL | Status: AC | PRN

## 2019-11-21 MED ORDER — NAPROXEN 500 MG PO TABS
500.0000 mg | ORAL_TABLET | Freq: Two times a day (BID) | ORAL | Status: AC | PRN
  Administered 2019-11-24: 500 mg via ORAL
  Filled 2019-11-21: qty 1

## 2019-11-21 MED ORDER — HYDROXYZINE HCL 25 MG PO TABS
25.0000 mg | ORAL_TABLET | Freq: Four times a day (QID) | ORAL | Status: AC | PRN
  Administered 2019-11-21 – 2019-11-25 (×7): 25 mg via ORAL
  Filled 2019-11-21 (×7): qty 1

## 2019-11-21 MED ORDER — CLONIDINE HCL 0.1 MG PO TABS
0.1000 mg | ORAL_TABLET | Freq: Every day | ORAL | Status: AC
  Administered 2019-11-26 – 2019-11-27 (×2): 0.1 mg via ORAL
  Filled 2019-11-21 (×2): qty 1

## 2019-11-21 MED ORDER — LOPERAMIDE HCL 2 MG PO CAPS: 2.0000 mg | ORAL_CAPSULE | ORAL | Status: AC | PRN

## 2019-11-21 MED ORDER — METHOCARBAMOL 500 MG PO TABS: 500.0000 mg | ORAL_TABLET | Freq: Three times a day (TID) | ORAL | Status: AC | PRN

## 2019-11-21 MED ORDER — DICYCLOMINE HCL 20 MG PO TABS: 20.0000 mg | ORAL_TABLET | Freq: Four times a day (QID) | ORAL | Status: AC | PRN

## 2019-11-21 MED ORDER — CLONIDINE HCL 0.1 MG PO TABS
0.1000 mg | ORAL_TABLET | Freq: Four times a day (QID) | ORAL | Status: AC
  Administered 2019-11-21 – 2019-11-23 (×8): 0.1 mg via ORAL
  Filled 2019-11-21 (×12): qty 1

## 2019-11-21 MED ORDER — CLONIDINE HCL 0.1 MG PO TABS
0.1000 mg | ORAL_TABLET | ORAL | Status: AC
  Administered 2019-11-24 – 2019-11-25 (×4): 0.1 mg via ORAL
  Filled 2019-11-21 (×4): qty 1
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
## 2019-11-21 NOTE — Progress Notes (Signed)
Patient in bed awake. Alert and oriented. Reports that he feels depressed and hopeless. Denying suicidal thoughts. Did not attend evening group. Was encouraged get participate in group activities as tolerated. Emotional support provided.  Safety monitored as recommended.
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
## 2019-11-21 NOTE — BH Assessment (Addendum)
Patient accepted to [facility]  

## 2019-11-21 NOTE — ED Provider Notes (Signed)
I received this patient as a handoff at shift change from [facility], [place].  Patient had already been medically cleared and we were simply awaiting TTS consult.  TTS personally evaluated patient and will admit to inpatient psychiatry.  No home medications to reorder.     Lorelee New, PA-C 11/21/19 7062    Geoffery Lyons, MD 11/21/19 1027

## 2019-11-21 NOTE — Plan of Care (Signed)
Nurse discussed anxiety, depression and coping skills with patient.  

## 2019-11-21 NOTE — ED Notes (Signed)
Pt has been cleared by security  °
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
## 2019-11-21 NOTE — ED Triage Notes (Signed)
Pt reports increased depression, including suicidal thoughts.  Was planning on jumping off the parking deck however his Mom convinced him to come to the ED instead.  Denies any substance abuse

## 2019-11-21 NOTE — ED Notes (Signed)
Breakfast tray at bedside 

## 2019-11-21 NOTE — ED Notes (Signed)
TTS in progress 

## 2019-11-21 NOTE — ED Notes (Signed)
Report given to Elizabeth, RN at [facility]. 

## 2019-11-21 NOTE — Tx Team (Signed)
Initial Treatment Plan 11/21/2019 7:13 PM [name] [id number]    PATIENT STRESSORS: Financial difficulties Health problems Marital or family conflict Occupational concerns Substance abuse Traumatic event   PATIENT STRENGTHS: Ability for insight Average or above average intelligence Capable of independent living Communication skills Motivation for treatment/growth Supportive family/friends Work skills   PATIENT IDENTIFIED PROBLEMS: "suicidal thoughts"  "anxiety"  "depression"  "substance abuse"               DISCHARGE CRITERIA:  Ability to meet basic life and health needs Adequate post-discharge living arrangements Improved stabilization in mood, thinking, and/or behavior Medical problems require only outpatient monitoring Motivation to continue treatment in a less acute level of care Need for constant or close observation no longer present Reduction of life-threatening or endangering symptoms to within safe limits Safe-care adequate arrangements made Verbal commitment to aftercare and medication compliance Withdrawal symptoms are absent or subacute and managed without 24-hour nursing intervention  PRELIMINARY DISCHARGE PLAN: Attend aftercare/continuing care group Attend PHP/IOP Attend 12-step recovery group Outpatient therapy  PATIENT/FAMILY INVOLVEMENT: This treatment plan has been presented to and reviewed with the patient, Jared Jordan..  The patient and family have been given the opportunity to ask questions and make suggestions.  Quintella Reichert [place], [place] 11/21/2019, 7:13 PM

## 2019-11-21 NOTE — ED Notes (Signed)
Lunch Tray Ordered @ 1110. 

## 2019-11-21 NOTE — Progress Notes (Signed)
Pt accepted to [facility], bed 307-2  Denzil Magnuson, NP is the accepting provider.    Dr. Jola Babinski is the attending provider.    Call report to [phone]   Becky @ [facility] ED notified.     Pt is voluntary and will be transported by [employer].     Pt is scheduled to arrive at [facility] at 3pm.   Wells Guiles, LCSW, LCAS Disposition CSW [facility]/TTS [phone]

## 2019-11-21 NOTE — ED Notes (Signed)
Print production planner for transportation

## 2019-11-22 DIAGNOSIS — F1994 Other psychoactive substance use, unspecified with psychoactive substance-induced mood disorder: Secondary | ICD-10-CM

## 2019-11-22 MED ORDER — TRAZODONE HCL 50 MG PO TABS
50.0000 mg | ORAL_TABLET | Freq: Every evening | ORAL | Status: DC | PRN
  Administered 2019-11-23 – 2019-11-26 (×5): 50 mg via ORAL
  Filled 2019-11-22 (×4): qty 1
  Filled 2019-11-22: qty 7
  Filled 2019-11-22: qty 1

## 2019-11-22 MED ORDER — THIAMINE HCL 100 MG PO TABS
100.0000 mg | ORAL_TABLET | Freq: Every day | ORAL | Status: DC
  Administered 2019-11-22 – 2019-11-27 (×6): 100 mg via ORAL
  Filled 2019-11-22 (×8): qty 1

## 2019-11-22 MED ORDER — FOLIC ACID 1 MG PO TABS
1.0000 mg | ORAL_TABLET | Freq: Every day | ORAL | Status: DC
  Administered 2019-11-22 – 2019-11-27 (×6): 1 mg via ORAL
  Filled 2019-11-22 (×8): qty 1

## 2019-11-22 MED ORDER — NICOTINE POLACRILEX 2 MG MT GUM: 2.0000 mg | CHEWING_GUM | OROMUCOSAL | Status: DC | PRN

## 2019-11-22 MED ORDER — LORAZEPAM 1 MG PO TABS: 1.0000 mg | ORAL_TABLET | Freq: Four times a day (QID) | ORAL | Status: DC | PRN

## 2019-11-22 MED ORDER — MIRTAZAPINE 15 MG PO TABS
15.0000 mg | ORAL_TABLET | Freq: Every day | ORAL | Status: DC
  Administered 2019-11-22 – 2019-11-26 (×5): 15 mg via ORAL
  Filled 2019-11-22: qty 1
  Filled 2019-11-22: qty 7
  Filled 2019-11-22 (×5): qty 1

## 2019-11-22 NOTE — [facility] Suicide Risk Assessment (Signed)
[facility] Admission Suicide Risk Assessment   Nursing information obtained from:  Patient Demographic factors:  Male, Caucasian, Living alone, Unemployed Current Mental Status:  NA Loss Factors:  Loss of significant relationship Historical Factors:  Prior suicide attempts Risk Reduction Factors:  Sense of responsibility to family, Religious beliefs about death  Total Time spent with patient: 30 minutes Principal Problem: <principal problem not specified> Diagnosis:  Active Problems:   Substance induced mood disorder ([place])  Subjective Data: Patient is seen and examined.  Patient is a 29 year old male with a reported past psychiatric history significant for opiate dependence, alcohol dependence, depression who presented to the [facility] emergency department on 11/21/2019 with suicidal ideation.  The patient stated he went to a bar last night to drink alcohol because he was depressed.  He is thought the alcohol would help.  He then went to a parking garage and was thinking about jumping off.  He decided to go to the emergency department.  The patient stated the most recent stressor was the death of his father sometime last year.  He also claimed significant relationship problems and drug use is a problem.  He denied any previous suicidal admissions, but review of the electronic medical record revealed a hospitalization at [facility] for suicidal ideation at that time.  He was discharged on Suboxone, hydroxyzine, mirtazapine and trazodone.  He was scheduled to follow-up with [name] on 09/07/2019, but apparently he decided to go to [facility].  He then admitted that he had had a hospitalization recently at [facility].  It sounds under similar circumstances.  Review of the PMP database revealed prescriptions for Suboxone on 2/5, 2/8 and 3/2.  He denied having had any Suboxone since the middle of March.  Review of his laboratories revealed a blood alcohol less than  10 and a negative drug screen.  He was admitted to the hospital for evaluation and stabilization.  Continued Clinical Symptoms:  Alcohol Use Disorder Identification Test Final Score (AUDIT): 8 The "Alcohol Use Disorders Identification Test", Guidelines for Use in Primary Care, Second Edition.  World Pharmacologist [facility]). Score between 0-7:  no or low risk or alcohol related problems. Score between 8-15:  moderate risk of alcohol related problems. Score between 16-19:  high risk of alcohol related problems. Score 20 or above:  warrants further diagnostic evaluation for alcohol dependence and treatment.   CLINICAL FACTORS:   Depression:   Anhedonia Comorbid alcohol abuse/dependence Hopelessness Impulsivity Insomnia Alcohol/Substance Abuse/Dependencies   Musculoskeletal: Strength & Muscle Tone: within normal limits Gait & Station: normal Patient leans: N/A  Psychiatric Specialty Exam: Physical Exam  Nursing note and vitals reviewed. Constitutional: He is oriented to person, place, and time. He appears well-developed and well-nourished.  HENT:  Head: Normocephalic and atraumatic.  Respiratory: Effort normal.  Neurological: He is alert and oriented to person, place, and time.    Review of Systems  Blood pressure 119/82, pulse 75, temperature (!) 101.8 F (38.8 C), temperature source Oral, resp. rate 18, SpO2 100 %.There is no height or weight on file to calculate BMI.  General Appearance: Casual  Eye Contact:  Fair  Speech:  Normal Rate  Volume:  Normal  Mood:  Anxious  Affect:  Congruent  Thought Process:  Coherent and Descriptions of Associations: Circumstantial  Orientation:  Full (Time, Place, and Person)  Thought Content:  Logical  Suicidal Thoughts:  No  Homicidal Thoughts:  No  Memory:  Immediate;   Fair Recent;  Fair Remote;   Fair  Judgement:  Impaired  Insight:  Lacking  Psychomotor Activity:  Normal  Concentration:  Concentration: Fair and Attention  Span: Fair  Recall:  [employer] of Knowledge:  Fair  Language:  Good  Akathisia:  Negative  Handed:  Right  AIMS (if indicated):     Assets:  Desire for Improvement Resilience  ADL's:  Intact  Cognition:  WNL  Sleep:  Number of Hours: 6.75      COGNITIVE FEATURES THAT CONTRIBUTE TO RISK:  None    SUICIDE RISK:   Mild:  Suicidal ideation of limited frequency, intensity, duration, and specificity.  There are no identifiable plans, no associated intent, mild dysphoria and related symptoms, good self-control (both objective and subjective assessment), few other risk factors, and identifiable protective factors, including available and accessible social support.  PLAN OF CARE: Patient is seen and examined.  Patient is a 29 year old male with the above-stated past psychiatric history who is admitted secondary to suicidal ideation and potential withdrawal from opiates and alcohol.  He will be admitted to the hospital.  He will be integrated in the milieu.  He will be encouraged to attend groups.  Given my concern for possible opiate withdrawal he has been placed on the opiate detox protocol.  As well because of his alcohol we will place him on lorazepam 1 mg p.o. every 6 hours as needed a CIWA greater than 10.  He does have a history of hepatitis C, and will have to monitor that.  He will also be placed on mirtazapine 15 mg p.o. nightly as he had received at the outside hospital.  We will also continue trazodone as needed for insomnia.  As stated above review of his laboratories really did not show any abnormalities.  His ALT was mildly elevated at 54.  Review of the electronic medical record additionally revealed an ER visit on 04/22/2019 secondary to use of crystal meth.  At that time he stated he had been using heroin, and had relapsed after 6 years of sobriety.  He used crystal meth that night.  He was discharged home.  I certify that inpatient services furnished can reasonably be expected to  improve the patient's condition.   Antonieta Pert, MD 11/22/2019, 11:52 AM

## 2019-11-22 NOTE — Progress Notes (Signed)
At the end of group pt asked writer if he could get sweat pants from the clothes closet. Pt told writer that he has clothes, but that he wants swat pants because they do not want to wear the same clothes everyday. Writer explained to pt what those sweat pants were used for and that they were for our pt who do not have clothing and no resources to receive clothing. Pt began to become upset and told writer thast they were told that we have over 70+ sweat pants in the closet so they did not understand why they could not have some. Writer reiterated what the sweat pants were used for and they replied that they will just continue to ask every staff member until they received them.  Writer was already informed by day shift tech that they already addressed the pt concerning this matter and they received the same response from pt.

## 2019-11-22 NOTE — [facility] Group Notes (Signed)
Adult Psychoeducational Group Note  Date:  11/22/2019 Time:  9:42 PM  Group Topic/Focus:  Wrap-Up Group:   The focus of this group is to help patients review their daily goal of treatment and discuss progress on daily workbooks.  Participation Level:  None  Participation Quality:  Attentive  Affect:  Irritable  Cognitive:  Alert  Insight: None  Engagement in Group:  None  Modes of Intervention:  Discussion and Education  Additional Comments:  Pt attended group, but opted out of participating in wrap up group.   Chrisandra Netters 11/22/2019, 9:42 PM

## 2019-11-22 NOTE — H&P (Signed)
Psychiatric Admission Assessment Adult  Patient Identification: Jared Jordan MRN:  [id number] Date of Evaluation:  11/22/2019 Chief Complaint:  Substance induced mood disorder (HCC) [F19.94] Principal Diagnosis: Substance induced mood disorder (HCC) Diagnosis:  Principal Problem:   Substance induced mood disorder (HCC)  History of Present Illness: Per MD SRA: Patient is a 29 year old male with a reported past psychiatric history significant for opiate dependence, alcohol dependence, depression who presented to the [facility] emergency department on 11/21/2019 with suicidal ideation.  The patient stated he went to a bar last night to drink alcohol because he was depressed.  He is thought the alcohol would help.  He then went to a parking garage and was thinking about jumping off.  He decided to go to the emergency department.  The patient stated the most recent stressor was the death of his father sometime last year.  He also claimed significant relationship problems and drug use is a problem.  He denied any previous suicidal admissions, but review of the electronic medical record revealed a hospitalization at [facility] for suicidal ideation at that time.  He was discharged on Suboxone, hydroxyzine, mirtazapine and trazodone.  He was scheduled to follow-up with [name] on 09/07/2019, but apparently he decided to go to [facility].  He then admitted that he had had a hospitalization recently at [facility].  It sounds under similar circumstances.  Review of the PMP database revealed prescriptions for Suboxone on 2/5, 2/8 and 3/2.  He denied having had any Suboxone since the middle of March.  Review of his laboratories revealed a blood alcohol less than 10 and a negative drug screen.  He was admitted to the hospital for evaluation and stabilization.  Patient is also evaluated by me and details the same story as above.  He also states that he knows that he  is going to be going through withdrawals and feels that he needs some assistance with it.  He is requesting to be started on Suboxone when the patient is informed that he is on a clonidine detox and he states understanding and agreement.  Dr. Jola Babinski was notified of the patient's request.  Patient is continued to remain calm but is lying in his bed resting stating that he did not want to go to group because he was a feeling that good from his withdrawals.  Associated Signs/Symptoms: Depression Symptoms:  depressed mood, insomnia, feelings of worthlessness/guilt, hopelessness, suicidal thoughts with specific plan, loss of energy/fatigue, (Hypo) Manic Symptoms:  Denies Anxiety Symptoms:  Denies Psychotic Symptoms:  Denies PTSD Symptoms: Negative Total Time spent with patient: 45 minutes  Past Psychiatric History: Multiple hospitalizations and ED visits for polysubstance abuse as well as MDD and suicide attempt.  In the last 6 months it appears the patient has had approximately 3 hospitalizations for mental health.  He reports 2 previous suicide attempts.  Is the patient at risk to self? Yes.    Has the patient been a risk to self in the past 6 months? Yes.    Has the patient been a risk to self within the distant past? Yes.    Is the patient a risk to others? No.  Has the patient been a risk to others in the past 6 months? No.  Has the patient been a risk to others within the distant past? No.   Prior Inpatient Therapy:   Prior Outpatient Therapy:    Alcohol Screening: 1. How often do you have a drink  containing alcohol?: 2 to 3 times a week 2. How many drinks containing alcohol do you have on a typical day when you are drinking?: 5 or 6 3. How often do you have six or more drinks on one occasion?: Weekly AUDIT-C Score: 8 4. How often during the last year have you found that you were not able to stop drinking once you had started?: Never 5. How often during the last year have you failed  to do what was normally expected from you because of drinking?: Never 6. How often during the last year have you needed a first drink in the morning to get yourself going after a heavy drinking session?: Never 7. How often during the last year have you had a feeling of guilt of remorse after drinking?: Never 8. How often during the last year have you been unable to remember what happened the night before because you had been drinking?: Never 9. Have you or someone else been injured as a result of your drinking?: No 10. Has a relative or friend or a doctor or another health worker been concerned about your drinking or suggested you cut down?: No Alcohol Use Disorder Identification Test Final Score (AUDIT): 8 Substance Abuse History in the last 12 months:  Yes.   Consequences of Substance Abuse: Medical Consequences:  reviewed Legal Consequences:  reviewed Family Consequences:  reviewed Previous Psychotropic Medications: Yes  Psychological Evaluations: No  Past Medical History:  Past Medical History:  Diagnosis Date  . Drug use     Past Surgical History:  Procedure Laterality Date  . SHOULDER SURGERY     Family History: History reviewed. No pertinent family history. Family Psychiatric  History: None reported Tobacco Screening:   Social History:  Social History   Substance and Sexual Activity  Alcohol Use Yes  . Alcohol/week: 4.0 - 6.0 standard drinks  . Types: 4 - 6 Cans of beer per week   Comment: 2-3 times weekly     Social History   Substance and Sexual Activity  Drug Use Not Currently  . Types: IV, Methamphetamines   Comment: using 1gm everyday heroin    Additional Social History:                           Allergies:  No Known Allergies Lab Results:  Results for orders placed or performed during the hospital encounter of 11/21/19 (from the past 48 hour(s))  Comprehensive metabolic panel     Status: Abnormal   Collection Time: 11/21/19  1:31 AM  Result  Value Ref Range   Sodium 139 135 - 145 mmol/L   Potassium 4.8 3.5 - 5.1 mmol/L   Chloride 102 98 - 111 mmol/L   CO2 27 22 - 32 mmol/L   Glucose, Bld 83 70 - 99 mg/dL    Comment: Glucose reference range applies only to samples taken after fasting for at least 8 hours.   BUN 8 6 - 20 mg/dL   Creatinine, Ser 4.09 0.61 - 1.24 mg/dL   Calcium 9.8 8.9 - 81.1 mg/dL   Total Protein 7.3 6.5 - 8.1 g/dL   Albumin 4.0 3.5 - 5.0 g/dL   AST 26 15 - 41 U/L   ALT 54 (H) 0 - 44 U/L   Alkaline Phosphatase 93 38 - 126 U/L   Total Bilirubin 0.6 0.3 - 1.2 mg/dL   GFR calc non Af Amer >60 >60 mL/min   GFR calc Af Amer >60 >  60 mL/min   Anion gap 10 5 - 15    Comment: Performed at [facility] [address], [place], [place] [address]  Ethanol     Status: None   Collection Time: 11/21/19  1:31 AM  Result Value Ref Range   Alcohol, Ethyl (B) <10 <10 mg/dL    Comment: (NOTE) Lowest detectable limit for serum alcohol is 10 mg/dL. For medical purposes only. Performed at [facility], [place] [address], [place], [place] [address]   Salicylate level     Status: Abnormal   Collection Time: 11/21/19  1:31 AM  Result Value Ref Range   Salicylate Lvl <2.4 (L) 7.0 - 30.0 mg/dL    Comment: Performed at [facility] [address], [place], [place] [address]  Acetaminophen level     Status: Abnormal   Collection Time: 11/21/19  1:31 AM  Result Value Ref Range   Acetaminophen (Tylenol), Serum <10 (L) 10 - 30 ug/mL    Comment: (NOTE) Therapeutic concentrations vary significantly. A range of 10-30 ug/mL  may be an effective concentration for many patients. However, some  are best treated at concentrations outside of this range. Acetaminophen concentrations >150 ug/mL at 4 hours after ingestion  and >50 ug/mL at 12 hours after ingestion are often associated with  toxic reactions. Performed at [facility], [place] [address], [place], [place] [address]   cbc     Status: None   Collection  Time: 11/21/19  1:31 AM  Result Value Ref Range   WBC 8.4 4.0 - 10.5 K/uL   RBC 5.30 4.22 - 5.81 MIL/uL   Hemoglobin 15.3 13.0 - 17.0 g/dL   HCT 47.2 39.0 - 52.0 %   MCV 89.1 80.0 - 100.0 fL   MCH 28.9 26.0 - 34.0 pg   MCHC 32.4 30.0 - 36.0 g/dL   RDW 13.2 11.5 - 15.5 %   Platelets 285 150 - 400 K/uL   nRBC 0.0 0.0 - 0.2 %    Comment: Performed at [facility], [place] [address], [place],  [address]  Rapid urine drug screen (hospital performed)     Status: None   Collection Time: 11/21/19  1:46 AM  Result Value Ref Range   Opiates NONE DETECTED NONE DETECTED   Cocaine NONE DETECTED NONE DETECTED   Benzodiazepines NONE DETECTED NONE DETECTED   Amphetamines NONE DETECTED NONE DETECTED   Tetrahydrocannabinol NONE DETECTED NONE DETECTED   Barbiturates NONE DETECTED NONE DETECTED    Comment: (NOTE) DRUG SCREEN FOR MEDICAL PURPOSES ONLY.  IF CONFIRMATION IS NEEDED FOR ANY PURPOSE, NOTIFY LAB WITHIN 5 DAYS. LOWEST DETECTABLE LIMITS FOR URINE DRUG SCREEN Drug Class                     Cutoff (ng/mL) Amphetamine and metabolites    1000 Barbiturate and metabolites    200 Benzodiazepine                 419 Tricyclics and metabolites     300 Opiates and metabolites        300 Cocaine and metabolites        300 THC                            50 Performed at [facility], [place] [address] [place],  [address]   SARS Coronavirus 2 by RT PCR (hospital order,  performed in [facility] hospital lab) Nasopharyngeal Nasopharyngeal Swab     Status: None   Collection Time: 11/21/19  9:30 AM   Specimen: Nasopharyngeal Swab  Result Value Ref Range   SARS Coronavirus 2 NEGATIVE NEGATIVE    Comment: (NOTE) SARS-CoV-2 target nucleic acids are NOT DETECTED. The SARS-CoV-2 RNA is generally detectable in upper and lower respiratory specimens during the acute phase of infection. The lowest concentration of SARS-CoV-2 viral copies this assay can detect is 250 copies / mL. A  negative result does not preclude SARS-CoV-2 infection and should not be used as the sole basis for treatment or other patient management decisions.  A negative result may occur with improper specimen collection / handling, submission of specimen other than nasopharyngeal swab, presence of viral mutation(s) within the areas targeted by this assay, and inadequate number of viral copies (<250 copies / mL). A negative result must be combined with clinical observations, patient history, and epidemiological information. Fact Sheet for Patients:   BoilerBrush.com.cyhttps://www.fda.gov/media/136312/download Fact Sheet for Healthcare Providers: https://pope.com/https://www.fda.gov/media/136313/download This test is not yet approved or cleared  by the [place] FDA and has been authorized for detection and/or diagnosis of SARS-CoV-2 by FDA under an Emergency Use Authorization (EUA).  This EUA will remain in effect (meaning this test can be used) for the duration of the COVID-19 declaration under Section 564(b)(1) of the Act, 21 U.S.C. section 360bbb-3(b)(1), unless the authorization is terminated or revoked sooner. Performed at [facility], [address] [place], [place] [address]     Blood Alcohol level:  Lab Results  Component Value Date   ETH <10 11/21/2019   ETH <10 08/28/2019    Metabolic Disorder Labs:  No results found for: HGBA1C, MPG No results found for: PROLACTIN No results found for: CHOL, TRIG, HDL, CHOLHDL, VLDL, LDLCALC  Current Medications: Current Facility-Administered Medications  Medication Dose Route Frequency Provider Last Rate Last Admin  . cloNIDine (CATAPRES) tablet 0.1 mg  0.1 mg Oral QID Denzil Magnusonhomas, Lashunda, NP   0.1 mg at 11/22/19 [id number]   Followed by  . [START ON 11/24/2019] cloNIDine (CATAPRES) tablet 0.1 mg  0.1 mg Oral Jamelle HaringBH-qamhs Thomas, Lashunda, NP       Followed by  . [START ON 11/26/2019] cloNIDine (CATAPRES) tablet 0.1 mg  0.1 mg Oral QAC breakfast Denzil Magnusonhomas, Lashunda, NP      .  dicyclomine (BENTYL) tablet 20 mg  20 mg Oral Q6H PRN Denzil Magnusonhomas, Lashunda, NP      . folic acid (FOLVITE) tablet 1 mg  1 mg Oral Daily Antonieta Pertlary, Greg Lawson, MD      . hydrOXYzine (ATARAX/VISTARIL) tablet 25 mg  25 mg Oral Q6H PRN Denzil Magnusonhomas, Lashunda, NP   25 mg at 11/22/19 0908  . loperamide (IMODIUM) capsule 2-4 mg  2-4 mg Oral PRN Denzil Magnusonhomas, Lashunda, NP      . LORazepam (ATIVAN) tablet 1 mg  1 mg Oral Q6H PRN Antonieta Pertlary, Greg Lawson, MD      . methocarbamol (ROBAXIN) tablet 500 mg  500 mg Oral Q8H PRN Denzil Magnusonhomas, Lashunda, NP      . mirtazapine (REMERON) tablet 15 mg  15 mg Oral QHS Antonieta Pertlary, Greg Lawson, MD      . naproxen (NAPROSYN) tablet 500 mg  500 mg Oral BID PRN Denzil Magnusonhomas, Lashunda, NP      . ondansetron (ZOFRAN-ODT) disintegrating tablet 4 mg  4 mg Oral Q6H PRN Denzil Magnusonhomas, Lashunda, NP      . thiamine tablet 100 mg  100 mg Oral  Daily Antonieta Pert, MD      . traZODone (DESYREL) tablet 50 mg  50 mg Oral QHS PRN Antonieta Pert, MD       PTA Medications: No medications prior to admission.    Musculoskeletal: Strength & Muscle Tone: within normal limits Gait & Station: normal Patient leans: N/A  Psychiatric Specialty Exam: Physical Exam  Nursing note and vitals reviewed. Constitutional: He is oriented to person, place, and time. He appears well-developed and well-nourished.  Cardiovascular: Normal rate.  Respiratory: Effort normal.  Musculoskeletal:        General: Normal range of motion.  Neurological: He is alert and oriented to person, place, and time.  Skin: Skin is warm.    Review of Systems  Constitutional: Negative.   HENT: Negative.   Eyes: Negative.   Respiratory: Negative.   Cardiovascular: Negative.   Gastrointestinal: Negative.   Genitourinary: Negative.   Musculoskeletal: Negative.   Skin: Negative.   Neurological: Negative.   Psychiatric/Behavioral: Positive for dysphoric mood.    Blood pressure 122/82, pulse 78, temperature (!) 101.8 F (38.8 C), temperature source  Oral, resp. rate 18, SpO2 100 %.There is no height or weight on file to calculate BMI.   General Appearance: Casual  Eye Contact:  Fair  Speech:  Normal Rate  Volume:  Normal  Mood:  Anxious  Affect:  Congruent  Thought Process:  Coherent and Descriptions of Associations: Circumstantial  Orientation:  Full (Time, Place, and Person)  Thought Content:  Logical  Suicidal Thoughts:  No  Homicidal Thoughts:  No  Memory:  Immediate;   Fair Recent;   Fair Remote;   Fair  Judgement:  Impaired  Insight:  Lacking  Psychomotor Activity:  Normal  Concentration:  Concentration: Fair and Attention Span: Fair  Recall:  [employer] of Knowledge:  Fair  Language:  Good  Akathisia:  Negative  Handed:  Right  AIMS (if indicated):     Assets:  Desire for Improvement Resilience  ADL's:  Intact  Cognition:  WNL  Sleep:  Number of Hours: 6.75   Treatment Plan Summary: Daily contact with patient to assess and evaluate symptoms and progress in treatment and Medication management   29 year old male with the above-stated past psychiatric history who is admitted secondary to suicidal ideation and potential withdrawal from opiates and alcohol.  He will be admitted to the hospital.  He will be integrated in the milieu.  He will be encouraged to attend groups.  Given my concern for possible opiate withdrawal he has been placed on the opiate detox protocol.  As well because of his alcohol we will place him on lorazepam 1 mg p.o. every 6 hours as needed a CIWA greater than 10.  He does have a history of hepatitis C, and will have to monitor that.  He will also be placed on mirtazapine 15 mg p.o. nightly as he had received at the outside hospital.  We will also continue trazodone as needed for insomnia.  As stated above review of his laboratories really did not show any abnormalities.  His ALT was mildly elevated at 54.  Review of the electronic medical record additionally revealed an ER visit on 04/22/2019 secondary  to use of crystal meth.  At that time he stated he had been using heroin, and had relapsed after 6 years of sobriety.  Observation Level/Precautions:  15 minute checks  Laboratory:  Reviewed  Psychotherapy:  Group therapy  Medications:  See [facility]  Consultations:  As  needed  Discharge Concerns:  Relapse  Estimated LOS: 3-5 Days  Other:  Admit to [address]   Physician Treatment Plan for Primary Diagnosis: Substance induced mood disorder (HCC) Long Term Goal(s): Improvement in symptoms so as ready for discharge  Short Term Goals: Ability to identify changes in lifestyle to reduce recurrence of condition will improve, Ability to verbalize feelings will improve, Ability to disclose and discuss suicidal ideas, Ability to demonstrate self-control will improve, Ability to identify and develop effective coping behaviors will improve, Ability to maintain clinical measurements within normal limits will improve, Compliance with prescribed medications will improve and Ability to identify triggers associated with substance abuse/mental health issues will improve  Physician Treatment Plan for Secondary Diagnosis: Principal Problem:   Substance induced mood disorder (HCC)  Long Term Goal(s): Improvement in symptoms so as ready for discharge  Short Term Goals: Ability to identify changes in lifestyle to reduce recurrence of condition will improve, Ability to verbalize feelings will improve, Ability to disclose and discuss suicidal ideas, Ability to demonstrate self-control will improve, Ability to identify and develop effective coping behaviors will improve, Ability to maintain clinical measurements within normal limits will improve, Compliance with prescribed medications will improve and Ability to identify triggers associated with substance abuse/mental health issues will improve  I certify that inpatient services furnished can reasonably be expected to improve the patient's condition.    Maryfrances Bunnell,  FNP 5/19/20211:50 PM

## 2019-11-22 NOTE — Plan of Care (Signed)
Progress note  D: pt found in bed; compliant with medication administration. Pt denies any physical complaints or pain. Pt is minimal but pleasant. Pt denies si/hi/ah/vh and verbally agrees to approach staff if these become apparent or before harming themself/others while at [facility].  A: Pt provided support and encouragement. Pt given medication per protocol and standing orders. Q86m safety checks implemented and continued.  R: Pt safe on the unit. Will continue to monitor.  Pt progressing in the following metrics  Problem: Health Behavior/Discharge Planning: Goal: Ability to make decisions will improve Outcome: Progressing Goal: Compliance with therapeutic regimen will improve Outcome: Progressing   Problem: Safety: Goal: Ability to disclose and discuss suicidal ideas will improve Outcome: Progressing

## 2019-11-22 NOTE — Progress Notes (Signed)
   11/22/19 2300  Psych Admission Type (Psych Patients Only)  Admission Status Voluntary  Psychosocial Assessment  Patient Complaints Anxiety  Eye Contact Fair  Facial Expression Anxious;Pensive  Affect Anxious;Appropriate to circumstance  Speech Logical/coherent  Interaction Assertive  Motor Activity Slow  Appearance/Hygiene Unremarkable  Behavior Characteristics Cooperative  Mood Anxious  Thought Process  Coherency WDL  Content WDL  Delusions None reported or observed  Perception WDL  Hallucination None reported or observed  Judgment Poor  Confusion None  Danger to Self  Current suicidal ideation? Denies  Danger to Others  Danger to Others None reported or observed

## 2019-11-22 NOTE — [facility] Group Notes (Signed)
11/22/2019 8:45am Type of Group and Topic: Psychoeducational Group: Discharge Planning  Participation Level: Did Not Attend  Description of Group Discharge planning group reviews patient's anticipated discharge plans and assists patients to anticipate and address any barriers to wellness/recovery in the community. Suicide prevention education is reviewed with patients in group. Therapeutic Goals 1. Patients will state their anticipated discharge plan and mental health aftercare 2. Patients will identify potential barriers to wellness in the community setting 3. Patients will engage in problem solving, solution focused discussion of ways to anticipate and address barriers to wellness/recovery   Summary of Patient Progress Plan for Discharge/Comments: Invited, chose not to attend.    Therapeutic Modalities: Motivational Interviewing     Baldo Daub, MSW, LCSWA Clinical Social Worker [facility]  Phone: [phone] 11/22/2019 2:28 PM

## 2019-11-22 NOTE — Plan of Care (Signed)
Newly admitted. Stayed in bed and was out for medications. Alert and oriented. Sad and depressed but denying suicidal thoughts. Received medications and went back to bed. Currently in bed sleeping. Safety monitored as recommended.

## 2019-11-23 LAB — LIPID PANEL
Cholesterol: 185 mg/dL (ref 0–200)
HDL: 51 mg/dL (ref >40)
LDL Cholesterol: 116 mg/dL — ABNORMAL HIGH (ref 0–99)
Total CHOL/HDL Ratio: 3.6 RATIO
Triglycerides: 91 mg/dL (ref <150)
VLDL: 18 mg/dL (ref 0–40)

## 2019-11-23 LAB — HEMOGLOBIN A1C
Hgb A1c MFr Bld: 5.4 % (ref 4.8–5.6)
Mean Plasma Glucose: 108.28 mg/dL

## 2019-11-23 LAB — TSH: TSH: 1.616 u[IU]/mL (ref 0.350–4.500)

## 2019-11-23 MED ORDER — VENLAFAXINE HCL ER 37.5 MG PO CP24
37.5000 mg | ORAL_CAPSULE | Freq: Every day | ORAL | Status: DC
  Administered 2019-11-23 – 2019-11-24 (×2): 37.5 mg via ORAL
  Filled 2019-11-23 (×4): qty 1

## 2019-11-23 MED ORDER — GABAPENTIN 100 MG PO CAPS
100.0000 mg | ORAL_CAPSULE | Freq: Three times a day (TID) | ORAL | Status: DC
  Administered 2019-11-23 – 2019-11-24 (×3): 100 mg via ORAL
  Filled 2019-11-23 (×6): qty 1

## 2019-11-23 NOTE — Progress Notes (Signed)
   11/23/19 2300  Psych Admission Type (Psych Patients Only)  Admission Status Voluntary  Psychosocial Assessment  Patient Complaints Anxiety  Eye Contact Fair  Facial Expression Anxious;Pensive  Affect Anxious;Appropriate to circumstance  Speech Logical/coherent  Interaction Assertive  Motor Activity Slow  Appearance/Hygiene Unremarkable  Behavior Characteristics Cooperative  Mood Anxious  Thought Process  Coherency WDL  Content WDL  Delusions None reported or observed  Perception WDL  Hallucination None reported or observed  Judgment Poor  Confusion None  Danger to Self  Current suicidal ideation? Denies  Danger to Others  Danger to Others None reported or observed

## 2019-11-23 NOTE — [facility] Counselor (Signed)
Adult Comprehensive Assessment  Patient ID: Jared Jordan, male   DOB: 1990/08/18, 29 y.o.   MRN: [id number]  Information Source: Information source: Patient  Current Stressors:  Patient states their primary concerns and needs for treatment are:: "I cant make the right decisions anymore" Patient states their goals for this hospitilization and ongoing recovery are:: "I need residential treatment" Educational / Learning stressors: Currently a student at [employer]; Reports he missed the deadline to register for classes due to being in the hospital Employment / Job issues: Unemployed Family Relationships: Reports having a strained relationship with his family due to his ongoing substance use Secretary/administrator / Lack of resources (include bankruptcy): No income; No health insurance Housing / Lack of housing: Currently lives with two roommates in an apartment; Denies any current stressors Physical health (include injuries & life threatening diseases): Denies any current stressors Social relationships: Denies any current stressors Substance abuse: Endorsed using heroin (opiates) on a daily basis; Reports using 1gram a day Bereavement / Loss: Reports he continues to struggle with the death of his father  Living/Environment/Situation:  Living Arrangements: Non-relatives/Friends Living conditions (as described by patient or guardian): "Apartment" Who else lives in the home?: 2 roommates How long has patient lived in current situation?: Since march 2021 What is atmosphere in current home: Comfortable  Family History:  Marital status: Single Are you sexually active?: No What is your sexual orientation?: Heterosexual Has your sexual activity been affected by drugs, alcohol, medication, or emotional stress?: No Does patient have children?: No  Childhood History:  By whom was/is the patient raised?: Both parents Additional childhood history information: Reports his parents were divorced when he was  a child, however both parents were active Description of patient's relationship with caregiver when they were a child: Reports having a close relationship with both parents during his childhood Patient's description of current relationship with people who raised him/her: Reports having a distant relationship with his mother currently. Patient shared his father is currently deceased How were you disciplined when you got in trouble as a child/adolescent?: Spankings; Verbally Does patient have siblings?: Yes Number of Siblings: 3 Description of patient's current relationship with siblings: Reports having an "okay" relationship with his sisters, however he reports having a strained relationship with his brother Did patient suffer any verbal/emotional/physical/sexual abuse as a child?: No Did patient suffer from severe childhood neglect?: No Has patient ever been sexually abused/assaulted/raped as an adolescent or adult?: No Was the patient ever a victim of a crime or a disaster?: No Witnessed domestic violence?: No Has patient been effected by domestic violence as an adult?: No  Education:  Highest grade of school patient has completed: 12th grade; Some college Currently a student?: No Learning disability?: No  Employment/Work Situation:   Employment situation: Unemployed Patient's job has been impacted by current illness: No What is the longest time patient has a held a job?: 6 months 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 Did You Receive Any Psychiatric Treatment/Services While in the [employer]?: No Are There Guns or Other Weapons in [place]?: No  Financial Resources:   Financial resources: No income Does patient have a Programmer, applications or guardian?: No  Alcohol/Substance Abuse:   What has been your use of drugs/alcohol within the last 12 months?: Endorsed using heroin (opiates) on a daily basis; Reports using 1gram a day If  attempted suicide, did drugs/alcohol play a role in this?: No Alcohol/Substance Abuse Treatment Hx: Denies past  history Has alcohol/substance abuse ever caused legal problems?: No  Social Support System:   Forensic psychologist System: None Describe Community Support System: "No one" Type of faith/religion: None How does patient's faith help to cope with current illness?: N/A  Leisure/Recreation:   Leisure and Hobbies: "Nothing currently"  Strengths/Needs:   What is the patient's perception of their strengths?: "Nothing currently" Patient states they can use these personal strengths during their treatment to contribute to their recovery: No Patient states these barriers may affect/interfere with their treatment: No Patient states these barriers may affect their return to the community: N/A Other important information patient would like considered in planning for their treatment: N/A  Discharge Plan:   Currently receiving community mental health services: Yes (From Whom) Patient states concerns and preferences for aftercare planning are: Patient expressed interest in residential treatment services at discharge Patient states they will know when they are safe and ready for discharge when: To be determined Does patient have access to transportation?: No Does patient have financial barriers related to discharge medications?: Yes Patient description of barriers related to discharge medications: No income; No health insurance Plan for no access to transportation at discharge: No Plan for living situation after discharge: Patient expressed interest in residential treatment Will patient be returning to same living situation after discharge?: No  Summary/Recommendations:   Summary and Recommendations (to be completed by the evaluator): Holton is a 29 year old male who is diagnosed with Substance induced mood disorder. He presented to the hospital seeking treatment for opiate  dependence, alcohol dependence and depressive symptoms. During the assessment, Trigger was pleasant and cooperative with providing information. Miroslav reports that he decided to come to the hospital because "this was the last attempt for me to get my life together". Jagdeep states that he is very interested in residential treatment. Ace can benefit from crisis stabilization, medication management, therapeutic milieu and referral services.  Maeola Sarah. 11/23/2019

## 2019-11-23 NOTE — Progress Notes (Signed)
DAR NOTE: Patient presents with anxious affect and mood.  Denies suicidal thoughts, pain, auditory and visual hallucinations.  Rates depression at 7, hopelessness at 7, and anxiety at 6.  Maintained on routine safety checks.  Medications given as prescribed.  Support and encouragement offered as needed.  Attended group and participated.  States goal for today is "feeling better."  Patient observed socializing with peers in the dayroom.  Patient is safe on and off the unit.  Offered no complaint.

## 2019-11-23 NOTE — Progress Notes (Signed)
[facility] MD Progress Note  11/23/2019 11:00 AM Jared Jordan  MRN:  [id number]   Subjective: Follow-up for this 29 year old male diagnosed with substance-induced mood disorder due to opiate abuse.  Patient reports today that as far as withdrawals he is feeling pretty good.  He states that he feels that he may be coming down with a cold but does not feel that it is due to any withdrawals.  He does report that he feels as though he is in a hole that he put himself and that he doesn't know how to take himself out.  He states that he does feel depressed and anxious and wants to find a way to improve things.  He states that he feels fatigued during the day and no motivation to get up and do things.  He feels that some medication can definitely help him with his depression and his motivation.  He also reports having anxiety.  At the current moment he denies any suicidal or homicidal ideations and denies any hallucinations.  He reports when he has time to think about the position he is in his when he has thoughts of wanting to harm himself.  He is potentially interested in going to residential treatment at this time.  Principal Problem: Substance induced mood disorder (HCC) Diagnosis: Principal Problem:   Substance induced mood disorder (HCC)  Total Time spent with patient: 30 minutes  Past Psychiatric History: Multiple hospitalizations and ED visits for polysubstance abuse as well as MDD and suicide attempt.  In the last 6 months it appears the patient has had approximately 3 hospitalizations for mental health.  He reports 2 previous suicide attempts  Past Medical History:  Past Medical History:  Diagnosis Date  . Drug use     Past Surgical History:  Procedure Laterality Date  . SHOULDER SURGERY     Family History: History reviewed. No pertinent family history. Family Psychiatric  History: None reported Social History:  Social History   Substance and Sexual Activity  Alcohol Use Yes  .  Alcohol/week: 4.0 - 6.0 standard drinks  . Types: 4 - 6 Cans of beer per week   Comment: 2-3 times weekly     Social History   Substance and Sexual Activity  Drug Use Not Currently  . Types: IV, Methamphetamines   Comment: using 1gm everyday heroin    Social History   Socioeconomic History  . Marital status: Single    Spouse name: Not on file  . Number of children: Not on file  . Years of education: Not on file  . Highest education level: Not on file  Occupational History  . Not on file  Tobacco Use  . Smoking status: Current Every Day Smoker    Packs/day: 0.25    Years: 1.00    Pack years: 0.25  . Smokeless tobacco: Never Used  Substance and Sexual Activity  . Alcohol use: Yes    Alcohol/week: 4.0 - 6.0 standard drinks    Types: 4 - 6 Cans of beer per week    Comment: 2-3 times weekly  . Drug use: Not Currently    Types: IV, Methamphetamines    Comment: using 1gm everyday heroin  . Sexual activity: Yes  Other Topics Concern  . Not on file  Social History Narrative  . Not on file   Social Determinants of Health   Financial Resource Strain:   . Difficulty of Paying Living Expenses:   Food Insecurity:   . Worried About [employer] of  Food in the Last Year:   . Ran Out of Food in the Last Year:   Transportation Needs:   . Freight forwarder (Medical):   Marland Kitchen Lack of Transportation (Non-Medical):   Physical Activity:   . Days of Exercise per Week:   . Minutes of Exercise per Session:   Stress:   . Feeling of Stress :   Social Connections:   . Frequency of Communication with Friends and Family:   . Frequency of Social Gatherings with Friends and Family:   . Attends Religious Services:   . Active Member of Clubs or Organizations:   . Attends Banker Meetings:   Marland Kitchen Marital Status:    Additional Social History:                         Sleep: Fair  Appetite:  Fair  Current Medications: Current Facility-Administered Medications   Medication Dose Route Frequency Provider Last Rate Last Admin  . cloNIDine (CATAPRES) tablet 0.1 mg  0.1 mg Oral QID Denzil Magnuson, NP   0.1 mg at 11/22/19 2109   Followed by  . [START ON 11/24/2019] cloNIDine (CATAPRES) tablet 0.1 mg  0.1 mg Oral Jamelle Haring, NP       Followed by  . [START ON 11/26/2019] cloNIDine (CATAPRES) tablet 0.1 mg  0.1 mg Oral QAC breakfast Denzil Magnuson, NP      . dicyclomine (BENTYL) tablet 20 mg  20 mg Oral Q6H PRN Denzil Magnuson, NP      . folic acid (FOLVITE) tablet 1 mg  1 mg Oral Daily Antonieta Pert, MD   1 mg at 11/23/19 0950  . gabapentin (NEURONTIN) capsule 100 mg  100 mg Oral TID Ferry Matthis, Gerlene Burdock, FNP      . hydrOXYzine (ATARAX/VISTARIL) tablet 25 mg  25 mg Oral Q6H PRN Denzil Magnuson, NP   25 mg at 11/23/19 0118  . loperamide (IMODIUM) capsule 2-4 mg  2-4 mg Oral PRN Denzil Magnuson, NP      . LORazepam (ATIVAN) tablet 1 mg  1 mg Oral Q6H PRN Antonieta Pert, MD      . methocarbamol (ROBAXIN) tablet 500 mg  500 mg Oral Q8H PRN Denzil Magnuson, NP      . mirtazapine (REMERON) tablet 15 mg  15 mg Oral QHS Antonieta Pert, MD   15 mg at 11/22/19 2109  . naproxen (NAPROSYN) tablet 500 mg  500 mg Oral BID PRN Denzil Magnuson, NP      . nicotine polacrilex (NICORETTE) gum 2 mg  2 mg Oral PRN Anike, Adaku C, NP      . ondansetron (ZOFRAN-ODT) disintegrating tablet 4 mg  4 mg Oral Q6H PRN Denzil Magnuson, NP      . thiamine tablet 100 mg  100 mg Oral Daily Antonieta Pert, MD   100 mg at 11/23/19 0950  . traZODone (DESYREL) tablet 50 mg  50 mg Oral QHS PRN Antonieta Pert, MD   50 mg at 11/23/19 0118  . venlafaxine XR (EFFEXOR-XR) 24 hr capsule 37.5 mg  37.5 mg Oral Q breakfast Basem Yannuzzi, Gerlene Burdock, FNP   37.5 mg at 11/23/19 0945    Lab Results:  Results for orders placed or performed during the hospital encounter of 11/21/19 (from the past 48 hour(s))  Hemoglobin A1c     Status: None   Collection Time: 11/23/19  6:22 AM   Result Value Ref Range   Hgb A1c  MFr Bld 5.4 4.8 - 5.6 %    Comment: (NOTE) Pre diabetes:          5.7%-6.4% Diabetes:              >6.4% Glycemic control for   <7.0% adults with diabetes    Mean Plasma Glucose 108.28 mg/dL    Comment: Performed at [facility], [address], [place], [place] [address]  Lipid panel     Status: Abnormal   Collection Time: 11/23/19  6:22 AM  Result Value Ref Range   Cholesterol 185 0 - 200 mg/dL   Triglycerides 91 <563 mg/dL   HDL 51 >14 mg/dL   Total CHOL/HDL Ratio 3.6 RATIO   VLDL 18 0 - 40 mg/dL   LDL Cholesterol 970 (H) 0 - 99 mg/dL    Comment:        Total Cholesterol/HDL:CHD Risk Coronary Heart Disease Risk Table                     Men   Women  1/2 Average Risk   3.4   3.3  Average Risk       5.0   4.4  2 X Average Risk   9.6   7.1  3 X Average Risk  23.4   11.0        Use the calculated Patient Ratio above and the CHD Risk Table to determine the patient's CHD Risk.        ATP III CLASSIFICATION (LDL):  <100     mg/dL   Optimal  263-785  mg/dL   Near or Above                    Optimal  130-159  mg/dL   Borderline  885-027  mg/dL   High  >741     mg/dL   Very High Performed at [facility], [address], [place], [place] [address]   TSH     Status: None   Collection Time: 11/23/19  6:22 AM  Result Value Ref Range   TSH 1.616 0.350 - 4.500 uIU/mL    Comment: Performed by a 3rd Generation assay with a functional sensitivity of <=0.01 uIU/mL. Performed at [facility], [address] [place], [place] [address]     Blood Alcohol level:  Lab Results  Component Value Date   ETH <10 11/21/2019   ETH <10 08/28/2019    Metabolic Disorder Labs: Lab Results  Component Value Date   HGBA1C 5.4 11/23/2019   MPG 108.28 11/23/2019   No results found for: PROLACTIN Lab Results  Component Value Date   CHOL 185 11/23/2019   TRIG 91 11/23/2019   HDL 51 11/23/2019   CHOLHDL 3.6  11/23/2019   VLDL 18 11/23/2019   LDLCALC 116 (H) 11/23/2019    Physical Findings: AIMS: Facial and Oral Movements Muscles of Facial Expression: None, normal Lips and Perioral Area: None, normal Jaw: None, normal Tongue: None, normal,Extremity Movements Upper (arms, wrists, hands, fingers): None, normal Lower (legs, knees, ankles, toes): None, normal, Trunk Movements Neck, shoulders, hips: None, normal, Overall Severity Severity of abnormal movements (highest score from questions above): None, normal Incapacitation due to abnormal movements: None, normal Patient's awareness of abnormal movements (rate only patient's report): No Awareness, Dental Status Current problems with teeth and/or dentures?: No Does patient usually wear dentures?: No  CIWA:  CIWA-Ar Total: 0 COWS:  COWS Total Score: 0  Musculoskeletal:  Strength & Muscle Tone: within normal limits Gait & Station: normal Patient leans: N/A  Psychiatric Specialty Exam: Physical Exam  Nursing note and vitals reviewed. Constitutional: He is oriented to person, place, and time. He appears well-developed and well-nourished.  Cardiovascular: Normal rate.  Respiratory: Effort normal.  Musculoskeletal:        General: Normal range of motion.  Neurological: He is alert and oriented to person, place, and time.  Skin: Skin is warm.    Review of Systems  Constitutional: Negative.   HENT: Negative.   Eyes: Negative.   Respiratory: Negative.   Cardiovascular: Negative.   Gastrointestinal: Negative.   Genitourinary: Negative.   Musculoskeletal: Negative.   Skin: Negative.   Neurological: Negative.   Psychiatric/Behavioral: Positive for dysphoric mood.    Blood pressure 111/77, pulse 77, temperature (!) 97.5 F (36.4 C), temperature source Oral, resp. rate 16, SpO2 100 %.There is no height or weight on file to calculate BMI.  General Appearance: Casual  Eye Contact:  Good  Speech:  Clear and Coherent and Normal Rate   Volume:  Normal  Mood:  Depressed  Affect:  Congruent  Thought Process:  Coherent and Descriptions of Associations: Intact  Orientation:  Full (Time, Place, and Person)  Thought Content:  WDL  Suicidal Thoughts:  No  Homicidal Thoughts:  No  Memory:  Immediate;   Fair Recent;   Fair Remote;   Fair  Judgement:  Fair  Insight:  Fair  Psychomotor Activity:  Normal  Concentration:  Concentration: Fair  Recall:  [employer] of Knowledge:  Fair  Language:  Fair  Akathisia:  No  Handed:  Right  AIMS (if indicated):     Assets:  Communication Skills Desire for Improvement Physical Health  ADL's:  Intact  Cognition:  WNL  Sleep:  Number of Hours: 4.75   Assessment: Patient presents in his room sitting in the bed and is pleasant.  He is also been staying up and walking around the milieu and interacting with peers and staff appropriately.  Patient continues having a flat affect during the evaluation as well as when he is seen on the milieu.  It was reported that he did refuse his medications this morning including his clonidine stating that he didn't feel that he needed them.  However he is in agreement with starting an antidepressant and medication for his mood stability.  Patient's vital signs have remained WNL.  Labs have been reviewed and his TSH, A1c, and lipid profile are all WNL.  Treatment Plan Summary: Daily contact with patient to assess and evaluate symptoms and progress in treatment and Medication management Continue clonidine detox protocol for opiate use Start Neurontin 100 mg p.o. 3 times daily for anxiety and withdrawal symptoms Continue Vistaril 25 mg p.o. every 6 hours as needed for anxiety Continue Ativan as needed detox protocol Continue Remeron 15 mg p.o. nightly for sleep and mood stability Continue trazodone 50 mg p.o. nightly as needed for insomnia Start Effexor XR 37.5 mg p.o. daily for depression and anxiety and titrate up as needed Encourage group therapy  participation Continue every 15 minute safety checks  Lowry Ram [name], FNP 11/23/2019, 11:00 AM

## 2019-11-23 NOTE — Progress Notes (Signed)
Patient refused all morning meds.  Patient stated the medicines are making him feel worse.  Does not need the medicines. Patient denied SI and HI, contracts for safety.  Denied A/V hallucinations. Safety maintained with 15 minute checks.

## 2019-11-24 MED ORDER — GABAPENTIN 300 MG PO CAPS
300.0000 mg | ORAL_CAPSULE | Freq: Three times a day (TID) | ORAL | Status: DC
  Administered 2019-11-24 – 2019-11-27 (×9): 300 mg via ORAL
  Filled 2019-11-24: qty 1
  Filled 2019-11-24 (×2): qty 21
  Filled 2019-11-24 (×9): qty 1
  Filled 2019-11-24: qty 21
  Filled 2019-11-24 (×2): qty 1

## 2019-11-24 MED ORDER — VENLAFAXINE HCL ER 75 MG PO CP24
75.0000 mg | ORAL_CAPSULE | Freq: Every day | ORAL | Status: DC
  Administered 2019-11-25 – 2019-11-27 (×3): 75 mg via ORAL
  Filled 2019-11-24 (×3): qty 1
  Filled 2019-11-24: qty 7
  Filled 2019-11-24: qty 1

## 2019-11-24 MED ORDER — NICOTINE 21 MG/24HR TD PT24
21.0000 mg | MEDICATED_PATCH | Freq: Every day | TRANSDERMAL | Status: DC
  Administered 2019-11-24 – 2019-11-27 (×4): 21 mg via TRANSDERMAL
  Filled 2019-11-24 (×7): qty 1

## 2019-11-24 NOTE — Progress Notes (Signed)
Recreation Therapy Notes  Date:  5.21.21 Time: 0930 Location: [address] Group Room  Group Topic: Stress Management  Goal Area(s) Addresses:  Patient will identify positive stress management techniques. Patient will identify benefits of using stress management post d/c.  Intervention: Stress Management  Activity:  Meditation.  LRT played a meditation that focused on being resilient in the face of adversity.  Patients were to listen and follow along as meditation played to participate.    Education:  Stress Management, Discharge Planning.   Education Outcome: Acknowledges Education  Clinical Observations/Feedback: Pt did not attend group activity.    Caroll Rancher, LRT/CTRS         Caroll Rancher A 11/24/2019 11:32 AM

## 2019-11-24 NOTE — Tx Team (Signed)
Interdisciplinary Treatment and Diagnostic Plan Update  11/24/2019 Time of Session: 9:00am Jared Jordan MRN: [id number]  Principal Diagnosis: Substance induced mood disorder (HCC)  Secondary Diagnoses: Principal Problem:   Substance induced mood disorder (HCC)   Current Medications:  Current Facility-Administered Medications  Medication Dose Route Frequency Provider Last Rate Last Admin  . cloNIDine (CATAPRES) tablet 0.1 mg  0.1 mg Oral Jamelle Haring, NP   0.1 mg at 11/24/19 0858   Followed by  . [START ON 11/26/2019] cloNIDine (CATAPRES) tablet 0.1 mg  0.1 mg Oral QAC breakfast Denzil Magnuson, NP      . dicyclomine (BENTYL) tablet 20 mg  20 mg Oral Q6H PRN Denzil Magnuson, NP      . folic acid (FOLVITE) tablet 1 mg  1 mg Oral Daily Antonieta Pert, MD   1 mg at 11/24/19 0858  . gabapentin (NEURONTIN) capsule 300 mg  300 mg Oral TID Money, Gerlene Burdock, FNP      . hydrOXYzine (ATARAX/VISTARIL) tablet 25 mg  25 mg Oral Q6H PRN Denzil Magnuson, NP   25 mg at 11/24/19 0904  . loperamide (IMODIUM) capsule 2-4 mg  2-4 mg Oral PRN Denzil Magnuson, NP      . LORazepam (ATIVAN) tablet 1 mg  1 mg Oral Q6H PRN Antonieta Pert, MD      . methocarbamol (ROBAXIN) tablet 500 mg  500 mg Oral Q8H PRN Denzil Magnuson, NP      . mirtazapine (REMERON) tablet 15 mg  15 mg Oral QHS Antonieta Pert, MD   15 mg at 11/23/19 2158  . naproxen (NAPROSYN) tablet 500 mg  500 mg Oral BID PRN Denzil Magnuson, NP   500 mg at 11/24/19 0904  . nicotine (NICODERM CQ - dosed in mg/24 hours) patch 21 mg  21 mg Transdermal Daily Money, Feliz Beam B, FNP   21 mg at 11/24/19 1022  . ondansetron (ZOFRAN-ODT) disintegrating tablet 4 mg  4 mg Oral Q6H PRN Denzil Magnuson, NP      . thiamine tablet 100 mg  100 mg Oral Daily Antonieta Pert, MD   100 mg at 11/24/19 0858  . traZODone (DESYREL) tablet 50 mg  50 mg Oral QHS PRN Antonieta Pert, MD   50 mg at 11/23/19 2158  . [START ON 11/25/2019]  venlafaxine XR (EFFEXOR-XR) 24 hr capsule 75 mg  75 mg Oral Q breakfast Money, Feliz Beam B, FNP       PTA Medications: No medications prior to admission.    Patient Stressors: Financial difficulties Health problems Marital or family conflict Occupational concerns Substance abuse Traumatic event  Patient Strengths: Ability for insight Average or above average intelligence Capable of independent living Communication skills Motivation for treatment/growth Supportive family/friends Work skills  Treatment Modalities: Medication Management, Group therapy, Case management,  1 to 1 session with clinician, Psychoeducation, Recreational therapy.   Physician Treatment Plan for Primary Diagnosis: Substance induced mood disorder (HCC) Long Term Goal(s): Improvement in symptoms so as ready for discharge Improvement in symptoms so as ready for discharge   Short Term Goals: Ability to identify changes in lifestyle to reduce recurrence of condition will improve Ability to verbalize feelings will improve Ability to disclose and discuss suicidal ideas Ability to demonstrate self-control will improve Ability to identify and develop effective coping behaviors will improve Ability to maintain clinical measurements within normal limits will improve Compliance with prescribed medications will improve Ability to identify triggers associated with substance abuse/mental health issues will improve Ability to  identify changes in lifestyle to reduce recurrence of condition will improve Ability to verbalize feelings will improve Ability to disclose and discuss suicidal ideas Ability to demonstrate self-control will improve Ability to identify and develop effective coping behaviors will improve Ability to maintain clinical measurements within normal limits will improve Compliance with prescribed medications will improve Ability to identify triggers associated with substance abuse/mental health issues will  improve  Medication Management: Evaluate patient's response, side effects, and tolerance of medication regimen.  Therapeutic Interventions: 1 to 1 sessions, Unit Group sessions and Medication administration.  Evaluation of Outcomes: Progressing  Physician Treatment Plan for Secondary Diagnosis: Principal Problem:   Substance induced mood disorder ([place])  Long Term Goal(s): Improvement in symptoms so as ready for discharge Improvement in symptoms so as ready for discharge   Short Term Goals: Ability to identify changes in lifestyle to reduce recurrence of condition will improve Ability to verbalize feelings will improve Ability to disclose and discuss suicidal ideas Ability to demonstrate self-control will improve Ability to identify and develop effective coping behaviors will improve Ability to maintain clinical measurements within normal limits will improve Compliance with prescribed medications will improve Ability to identify triggers associated with substance abuse/mental health issues will improve Ability to identify changes in lifestyle to reduce recurrence of condition will improve Ability to verbalize feelings will improve Ability to disclose and discuss suicidal ideas Ability to demonstrate self-control will improve Ability to identify and develop effective coping behaviors will improve Ability to maintain clinical measurements within normal limits will improve Compliance with prescribed medications will improve Ability to identify triggers associated with substance abuse/mental health issues will improve     Medication Management: Evaluate patient's response, side effects, and tolerance of medication regimen.  Therapeutic Interventions: 1 to 1 sessions, Unit Group sessions and Medication administration.  Evaluation of Outcomes: Progressing   RN Treatment Plan for Primary Diagnosis: Substance induced mood disorder ([place]) Long Term Goal(s): Knowledge of disease and  therapeutic regimen to maintain health will improve  Short Term Goals: Ability to verbalize feelings will improve and Ability to identify and develop effective coping behaviors will improve  Medication Management: RN will administer medications as ordered by provider, will assess and evaluate patient's response and provide education to patient for prescribed medication. RN will report any adverse and/or side effects to prescribing provider.  Therapeutic Interventions: 1 on 1 counseling sessions, Psychoeducation, Medication administration, Evaluate responses to treatment, Monitor vital signs and CBGs as ordered, Perform/monitor CIWA, COWS, AIMS and Fall Risk screenings as ordered, Perform wound care treatments as ordered.  Evaluation of Outcomes: Progressing   LCSW Treatment Plan for Primary Diagnosis: Substance induced mood disorder ([place]) Long Term Goal(s): Safe transition to appropriate next level of care at discharge, Engage patient in therapeutic group addressing interpersonal concerns.  Short Term Goals: Engage patient in aftercare planning with referrals and resources, Increase social support, Increase emotional regulation, Facilitate patient progression through stages of change regarding substance use diagnoses and concerns, Identify triggers associated with mental health/substance abuse issues and Increase skills for wellness and recovery  Therapeutic Interventions: Assess for all discharge needs, 1 to 1 time with Social worker, Explore available resources and support systems, Assess for adequacy in community support network, Educate family and significant other(s) on suicide prevention, Complete Psychosocial Assessment, Interpersonal group therapy.  Evaluation of Outcomes: Progressing   Progress in Treatment: Attending groups: Yes. Participating in groups: Yes. Taking medication as prescribed: Yes. Toleration medication: Yes. Family/Significant other contact made: No, will contact:  declined consents. SPE reviewed with patient. Patient understands diagnosis: Yes. Discussing patient identified problems/goals with staff: Yes. Medical problems stabilized or resolved: Yes. Denies suicidal/homicidal ideation: Yes. Issues/concerns per patient self-inventory: Yes.  New problem(s) identified: Yes, Describe:  family stressors, grief and loss.  New Short Term/Long Term Goal(s): detox, medication management for mood stabilization; elimination of SI thoughts; development of comprehensive mental wellness/sobriety plan.  Patient Goals:  "Go to residential treatment."  Discharge Plan or Barriers: Has been [facility] to [facility]  Reason for Continuation of Hospitalization: Anxiety Depression Medication stabilization Withdrawal symptoms  Estimated Length of Stay: 3-5 days  Attendees: Patient: Jared Jordan 11/24/2019 10:25 AM  Physician:  11/24/2019 10:25 AM  Nursing:  11/24/2019 10:25 AM  RN Care Manager: 11/24/2019 10:25 AM  Social Worker: Enid Cutter, Theresia Majors 11/24/2019 10:25 AM  Recreational Therapist:  11/24/2019 10:25 AM  Other: Reola Calkins, NP 11/24/2019 10:25 AM  Other:  11/24/2019 10:25 AM  Other: 11/24/2019 10:25 AM    Scribe for Treatment Team: Darreld Mclean, LCSWA 11/24/2019 10:25 AM

## 2019-11-24 NOTE — Plan of Care (Signed)
Patient has been in the milieu. Alert and oriented. Reports that his day was ok. Calm and cooperative on approach but guarded, not willing to talk much about his feelings. Denying suicidal thoughts. Attended AA group and remained active. Received a snack. No sign of distress. Support and encouragements provided. Safety precautions maintained.

## 2019-11-24 NOTE — Progress Notes (Signed)
[facility] MD Progress Note  11/24/2019 11:05 AM Jared Jordan  MRN:  [id number]   Subjective: Follow-up for this 29 year old male diagnosed with substance-induced mood disorder. Patient states that he is doing okay today. He reports that he just feels a little weird and reports that as feeling some minor agitation, anxiety, and an "foggy headed.". He states that he feels that his thoughts are just not clear today. He denies any suicidal or homicidal ideations and denies any hallucinations. He states that he feels the medications are working for him and states that he tolerated them well and is okay with having them increased. He states that he was hoping to have gotten started on Suboxone when he came here and then stated that he understood that we did not start Suboxone and that he needed to clinic to follow-up with and he feels that maybe he would be better off not being on anything at all. 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 Patient states that he wants to residential treatment and become sober so that he can return to college.  Principal Problem: Substance induced mood disorder (HCC) Diagnosis: Principal Problem:   Substance induced mood disorder ([place])  Total Time spent with patient: 30 minutes  Past Psychiatric History: Multiple hospitalizations and ED visits for polysubstance abuse as well as MDD and suicide attempt. In the last 6 months it appears the patient has had approximately 3 hospitalizations for mental health. He reports 2 previous suicide attempts  Past Medical History:  Past Medical History:  Diagnosis Date  . Drug use     Past Surgical History:  Procedure Laterality Date  . SHOULDER SURGERY     Family History: History reviewed. No pertinent family history. Family Psychiatric  History: None reported Social History:  Social History   Substance and Sexual Activity  Alcohol  Use Yes  . Alcohol/week: 4.0 - 6.0 standard drinks  . Types: 4 - 6 Cans of beer per week   Comment: 2-3 times weekly     Social History   Substance and Sexual Activity  Drug Use Not Currently  . Types: IV, Methamphetamines   Comment: using 1gm everyday heroin    Social History   Socioeconomic History  . Marital status: Single    Spouse name: Not on file  . Number of children: Not on file  . Years of education: Not on file  . Highest education level: Not on file  Occupational History  . Not on file  Tobacco Use  . Smoking status: Current Every Day Smoker    Packs/day: 0.25    Years: 1.00    Pack years: 0.25  . Smokeless tobacco: Never Used  Substance and Sexual Activity  . Alcohol use: Yes    Alcohol/week: 4.0 - 6.0 standard drinks    Types: 4 - 6 Cans of beer per week    Comment: 2-3 times weekly  . Drug use: Not Currently    Types: IV, Methamphetamines    Comment: using 1gm everyday heroin  . Sexual activity: Yes  Other Topics Concern  . Not on file  Social History Narrative  . Not on file   Social Determinants of Health   Financial Resource Strain:   . Difficulty of Paying Living Expenses:   Food Insecurity:   . Worried About Charity fundraiser in the Last Year:   . [employer]  of Food in the Last Year:   Transportation Needs:   . Freight forwarder (Medical):   Marland Kitchen Lack of Transportation (Non-Medical):   Physical Activity:   . Days of Exercise per Week:   . Minutes of Exercise per Session:   Stress:   . Feeling of Stress :   Social Connections:   . Frequency of Communication with Friends and Family:   . Frequency of Social Gatherings with Friends and Family:   . Attends Religious Services:   . Active Member of Clubs or Organizations:   . Attends Banker Meetings:   Marland Kitchen Marital Status:    Additional Social History:                         Sleep: Good  Appetite:  Good  Current Medications: Current Facility-Administered  Medications  Medication Dose Route Frequency Provider Last Rate Last Admin  . cloNIDine (CATAPRES) tablet 0.1 mg  0.1 mg Oral Jamelle Haring, NP   0.1 mg at 11/24/19 0858   Followed by  . [START ON 11/26/2019] cloNIDine (CATAPRES) tablet 0.1 mg  0.1 mg Oral QAC breakfast Denzil Magnuson, NP      . dicyclomine (BENTYL) tablet 20 mg  20 mg Oral Q6H PRN Denzil Magnuson, NP      . folic acid (FOLVITE) tablet 1 mg  1 mg Oral Daily Antonieta Pert, MD   1 mg at 11/24/19 0858  . gabapentin (NEURONTIN) capsule 300 mg  300 mg Oral TID Dniyah Grant, Gerlene Burdock, FNP      . hydrOXYzine (ATARAX/VISTARIL) tablet 25 mg  25 mg Oral Q6H PRN Denzil Magnuson, NP   25 mg at 11/24/19 0904  . loperamide (IMODIUM) capsule 2-4 mg  2-4 mg Oral PRN Denzil Magnuson, NP      . LORazepam (ATIVAN) tablet 1 mg  1 mg Oral Q6H PRN Antonieta Pert, MD      . methocarbamol (ROBAXIN) tablet 500 mg  500 mg Oral Q8H PRN Denzil Magnuson, NP      . mirtazapine (REMERON) tablet 15 mg  15 mg Oral QHS Antonieta Pert, MD   15 mg at 11/23/19 2158  . naproxen (NAPROSYN) tablet 500 mg  500 mg Oral BID PRN Denzil Magnuson, NP   500 mg at 11/24/19 0904  . nicotine (NICODERM CQ - dosed in mg/24 hours) patch 21 mg  21 mg Transdermal Daily Max Nuno, Feliz Beam B, FNP   21 mg at 11/24/19 1022  . ondansetron (ZOFRAN-ODT) disintegrating tablet 4 mg  4 mg Oral Q6H PRN Denzil Magnuson, NP      . thiamine tablet 100 mg  100 mg Oral Daily Antonieta Pert, MD   100 mg at 11/24/19 0858  . traZODone (DESYREL) tablet 50 mg  50 mg Oral QHS PRN Antonieta Pert, MD   50 mg at 11/23/19 2158  . [START ON 11/25/2019] venlafaxine XR (EFFEXOR-XR) 24 hr capsule 75 mg  75 mg Oral Q breakfast Gerlad Pelzel, Gerlene Burdock, FNP        Lab Results:  Results for orders placed or performed during the hospital encounter of 11/21/19 (from the past 48 hour(s))  Hemoglobin A1c     Status: None   Collection Time: 11/23/19  6:22 AM  Result Value Ref Range   Hgb A1c MFr  Bld 5.4 4.8 - 5.6 %    Comment: (NOTE) Pre diabetes:          5.7%-6.4% Diabetes:              >  6.4% Glycemic control for   <7.0% adults with diabetes    Mean Plasma Glucose 108.28 mg/dL    Comment: Performed at [facility], [address], [place], [place] [address]  Lipid panel     Status: Abnormal   Collection Time: 11/23/19  6:22 AM  Result Value Ref Range   Cholesterol 185 0 - 200 mg/dL   Triglycerides 91 <295 mg/dL   HDL 51 >18 mg/dL   Total CHOL/HDL Ratio 3.6 RATIO   VLDL 18 0 - 40 mg/dL   LDL Cholesterol 841 (H) 0 - 99 mg/dL    Comment:        Total Cholesterol/HDL:CHD Risk Coronary Heart Disease Risk Table                     Men   Women  1/2 Average Risk   3.4   3.3  Average Risk       5.0   4.4  2 X Average Risk   9.6   7.1  3 X Average Risk  23.4   11.0        Use the calculated Patient Ratio above and the CHD Risk Table to determine the patient's CHD Risk.        ATP III CLASSIFICATION (LDL):  <100     mg/dL   Optimal  660-630  mg/dL   Near or Above                    Optimal  130-159  mg/dL   Borderline  160-109  mg/dL   High  >323     mg/dL   Very High Performed at [facility], [address] [place], [place] [address]   TSH     Status: None   Collection Time: 11/23/19  6:22 AM  Result Value Ref Range   TSH 1.616 0.350 - 4.500 uIU/mL    Comment: Performed by a 3rd Generation assay with a functional sensitivity of <=0.01 uIU/mL. Performed at [facility], [address], [place], [place] [address]     Blood Alcohol level:  Lab Results  Component Value Date   ETH <10 11/21/2019   ETH <10 08/28/2019    Metabolic Disorder Labs: Lab Results  Component Value Date   HGBA1C 5.4 11/23/2019   MPG 108.28 11/23/2019   No results found for: PROLACTIN Lab Results  Component Value Date   CHOL 185 11/23/2019   TRIG 91 11/23/2019   HDL 51 11/23/2019   CHOLHDL 3.6 11/23/2019   VLDL 18 11/23/2019    LDLCALC 116 (H) 11/23/2019    Physical Findings: AIMS: Facial and Oral Movements Muscles of Facial Expression: None, normal Lips and Perioral Area: None, normal Jaw: None, normal Tongue: None, normal,Extremity Movements Upper (arms, wrists, hands, fingers): None, normal Lower (legs, knees, ankles, toes): None, normal, Trunk Movements Neck, shoulders, hips: None, normal, Overall Severity Severity of abnormal movements (highest score from questions above): None, normal Incapacitation due to abnormal movements: None, normal Patient's awareness of abnormal movements (rate only patient's report): No Awareness, Dental Status Current problems with teeth and/or dentures?: No Does patient usually wear dentures?: No  CIWA:  CIWA-Ar Total: 0 COWS:  COWS Total Score: 0  Musculoskeletal: Strength & Muscle Tone: within normal limits Gait & Station: normal Patient leans: N/A  Psychiatric Specialty Exam: Physical Exam  Nursing note and vitals reviewed. Constitutional: He is oriented to person, place, and time. He appears well-developed  and well-nourished.  Cardiovascular: Normal rate.  Respiratory: Effort normal.  Musculoskeletal:        General: Normal range of motion.  Neurological: He is alert and oriented to person, place, and time.  Skin: Skin is warm.    Review of Systems  Constitutional: Negative.   HENT: Negative.   Eyes: Negative.   Respiratory: Negative.   Cardiovascular: Negative.   Gastrointestinal: Negative.   Genitourinary: Negative.   Musculoskeletal: Negative.   Skin: Negative.   Neurological: Negative.   Psychiatric/Behavioral: Positive for agitation. The patient is nervous/anxious.     Blood pressure 126/79, pulse 89, temperature (!) 97.4 F (36.3 C), temperature source Oral, resp. rate 18, SpO2 100 %.There is no height or weight on file to calculate BMI.  General Appearance: Casual  Eye Contact:  Good  Speech:  Clear and Coherent and Normal Rate  Volume:   Normal  Mood:  Euthymic  Affect:  Congruent  Thought Process:  Coherent and Descriptions of Associations: Intact  Orientation:  Full (Time, Place, and Person)  Thought Content:  WDL  Suicidal Thoughts:  No  Homicidal Thoughts:  No  Memory:  Immediate;   Good Recent;   Good Remote;   Good  Judgement:  Fair  Insight:  Fair  Psychomotor Activity:  Normal  Concentration:  Concentration: Fair  Recall:  Fair  Fund of Knowledge:  Fair  Language:  Good  Akathisia:  No  Handed:  Right  AIMS (if indicated):     Assets:  Communication Skills Desire for Improvement Housing Physical Health Resilience Social Support  ADL's:  Intact  Cognition:  WNL  Sleep:  Number of Hours: 6.75   Assessment: Patient presents in the day room and is pleasant, calm, cooperative. Patient is sitting reading a magazine and does not appear to be anxious or agitated at all. Patient has not shown any signs or symptoms of psychosis. He has been interacting with peers and staff appropriately and there've been no complaints about the patient. He has been compliant with his medications and treatment. Patient has reported toleration of his medications. Will increase his Effexor to 75 mg daily and his Neurontin to 300 mg 3 times daily. CSW is still seeking placement for residential treatment. Patient states that he feels that the medications as well as more time of being off of substances will help clear up his mind to make him feel better.  Treatment Plan Summary: Daily contact with patient to assess and evaluate symptoms and progress in treatment and Medication management Continue clonidine detox protocol Increase Neurontin to 300 mg p.o. 3 times daily for mood stability and withdrawal symptoms Continue Vistaril 25 mg p.o. every 6 hours as needed for anxiety Continue Ativan 1 mg every 6 hours as needed for CIWA greater than 10 Continue Remeron 15 mg p.o. nightly for sleep and mood stability Continue trazodone 50 mg  p.o. nightly as needed for insomnia Increase Effexor XR 75 mg p.o. daily with breakfast for depression and anxiety Therapy participation Continue every 15 minute safety checks  Maryfrances Bunnell, FNP 11/24/2019, 11:05 AM

## 2019-11-24 NOTE — Progress Notes (Signed)
   11/24/19 1021  Psych Admission Type (Psych Patients Only)  Admission Status Voluntary  Psychosocial Assessment  Patient Complaints Anxiety;Irritability;Sleep disturbance  Eye Contact Fair  Facial Expression Anxious;Pensive  Affect Anxious;Appropriate to circumstance  Speech Logical/coherent  Interaction Assertive  Motor Activity Slow  Appearance/Hygiene Unremarkable  Behavior Characteristics Cooperative  Mood Anxious;Irritable  Thought Process  Coherency WDL  Content WDL  Delusions None reported or observed  Perception WDL  Hallucination None reported or observed  Judgment Poor  Confusion None  Danger to Self  Current suicidal ideation? Denies  Danger to Others  Danger to Others None reported or observed   Pt presents anxious and irritable on interactions throughout this shift. Denies SI, HI, AVH and pain when assessed. Refused to attend scheduled groups despite multiple prompts "I don't do that, that is not going to help me. I just need to take my medicines, go to bed and wait for y'all to find me somewhwere to go". Reports he slept fair last night with good appetite, low energy level and poor concentration level. Rates his depression 8/10, hopelessness 7/10 and anxiety 7/10.  Compliant with all medications when offered and tolerates all PO intake well. Denies SI, HI, AVH and pain when assessed.  Emotional support and encouragement offered to pt as needed throughout this shift. Verbal education provided on scheduled and PRN medications on administration and effects monitored. Q 15 minutes safety checks maintained without incident to note at this time.  Pt remains safe on and off unit.

## 2019-11-25 NOTE — Progress Notes (Signed)
[facility] MD Progress Note  11/25/2019 4:17 PM Jared Jordan  MRN:  [id number]   Subjective: Jared Jordan reports, "I'm feeling pretty good today".  Objective: Patient is a 29 year old male with a reported past psychiatric history significant for opiate dependence, alcohol dependence, depression who presented to the [facility] emergency department on 11/21/2019 with suicidal ideation. The patient stated he went to a bar last night to drink alcohol because he was depressed. He is thought the alcohol would help. He then went to a parking garage and was thinking about jumping off. He decided to go to the emergency department.  This is a follow-up care assessment  for this 29 year old male diagnosed with substance-induced mood disorder. Patient states that he is doing pretty good today. He states that he feels that his thoughts are getting clearer on daily basis. He denies any suicidal or homicidal ideations, hallucinations, delusions or paranoia. He states that he feels the medications are working for him, he is tolerating them well. He states that he was hoping to have started on Suboxone when he came here and then stated that he undersands that we did not start Suboxone and that he needed a clinic to follow-up with. He says he feels that maybe he would be better off not being on anything at all.  Patient continues to hope to get into a residential treatment center after discharge. He is in agreement to continue current plan of care as already in progress.  Principal Problem: Substance induced mood disorder (HCC) Diagnosis: Principal Problem:   Substance induced mood disorder ([place])  Total Time spent with patient: 30 minutes  Past Psychiatric History: Multiple hospitalizations and ED visits for polysubstance abuse as well as MDD and suicide attempt. In the last 6 months it appears the patient has had approximately 3 hospitalizations for mental health. He reports 2 previous suicide  attempts  Past Medical History:  Past Medical History:  Diagnosis Date  . Drug use     Past Surgical History:  Procedure Laterality Date  . SHOULDER SURGERY     Family History: History reviewed. No pertinent family history.  Family Psychiatric  History: None reported  Social History:  Social History   Substance and Sexual Activity  Alcohol Use Yes  . Alcohol/week: 4.0 - 6.0 standard drinks  . Types: 4 - 6 Cans of beer per week   Comment: 2-3 times weekly     Social History   Substance and Sexual Activity  Drug Use Not Currently  . Types: IV, Methamphetamines   Comment: using 1gm everyday heroin    Social History   Socioeconomic History  . Marital status: Single    Spouse name: Not on file  . Number of children: Not on file  . Years of education: Not on file  . Highest education level: Not on file  Occupational History  . Not on file  Tobacco Use  . Smoking status: Current Every Day Smoker    Packs/day: 0.25    Years: 1.00    Pack years: 0.25  . Smokeless tobacco: Never Used  Substance and Sexual Activity  . Alcohol use: Yes    Alcohol/week: 4.0 - 6.0 standard drinks    Types: 4 - 6 Cans of beer per week    Comment: 2-3 times weekly  . Drug use: Not Currently    Types: IV, Methamphetamines    Comment: using 1gm everyday heroin  . Sexual activity: Yes  Other Topics Concern  . Not on file  Social History Narrative  . Not on file   Social Determinants of Health   Financial Resource Strain:   . Difficulty of Paying Living Expenses:   Food Insecurity:   . Worried About Programme researcher, broadcasting/film/video in the Last Year:   . Barista in the Last Year:   Transportation Needs:   . Freight forwarder (Medical):   Marland Kitchen Lack of Transportation (Non-Medical):   Physical Activity:   . Days of Exercise per Week:   . Minutes of Exercise per Session:   Stress:   . Feeling of Stress :   Social Connections:   . Frequency of Communication with Friends and Family:    . Frequency of Social Gatherings with Friends and Family:   . Attends Religious Services:   . Active Member of Clubs or Organizations:   . Attends Banker Meetings:   Marland Kitchen Marital Status:    Additional Social History:   Sleep: Good  Appetite:  Good  Current Medications: Current Facility-Administered Medications  Medication Dose Route Frequency Provider Last Rate Last Admin  . cloNIDine (CATAPRES) tablet 0.1 mg  0.1 mg Oral Jamelle Haring, NP   0.1 mg at 11/25/19 0743   Followed by  . [START ON 11/26/2019] cloNIDine (CATAPRES) tablet 0.1 mg  0.1 mg Oral QAC breakfast Denzil Magnuson, NP      . dicyclomine (BENTYL) tablet 20 mg  20 mg Oral Q6H PRN Denzil Magnuson, NP      . folic acid (FOLVITE) tablet 1 mg  1 mg Oral Daily Antonieta Pert, MD   1 mg at 11/25/19 0740  . gabapentin (NEURONTIN) capsule 300 mg  300 mg Oral TID Money, Gerlene Burdock, FNP   300 mg at 11/25/19 1221  . hydrOXYzine (ATARAX/VISTARIL) tablet 25 mg  25 mg Oral Q6H PRN Denzil Magnuson, NP   25 mg at 11/24/19 1609  . loperamide (IMODIUM) capsule 2-4 mg  2-4 mg Oral PRN Denzil Magnuson, NP      . LORazepam (ATIVAN) tablet 1 mg  1 mg Oral Q6H PRN Antonieta Pert, MD      . methocarbamol (ROBAXIN) tablet 500 mg  500 mg Oral Q8H PRN Denzil Magnuson, NP      . mirtazapine (REMERON) tablet 15 mg  15 mg Oral QHS Antonieta Pert, MD   15 mg at 11/24/19 2138  . naproxen (NAPROSYN) tablet 500 mg  500 mg Oral BID PRN Denzil Magnuson, NP   500 mg at 11/24/19 0904  . nicotine (NICODERM CQ - dosed in mg/24 hours) patch 21 mg  21 mg Transdermal Daily Money, Feliz Beam B, FNP   21 mg at 11/25/19 0750  . ondansetron (ZOFRAN-ODT) disintegrating tablet 4 mg  4 mg Oral Q6H PRN Denzil Magnuson, NP      . thiamine tablet 100 mg  100 mg Oral Daily Antonieta Pert, MD   100 mg at 11/25/19 0739  . traZODone (DESYREL) tablet 50 mg  50 mg Oral QHS PRN Antonieta Pert, MD   50 mg at 11/24/19 2138  . venlafaxine  XR (EFFEXOR-XR) 24 hr capsule 75 mg  75 mg Oral Q breakfast Money, Gerlene Burdock, FNP   75 mg at 11/25/19 3762   Lab Results:  No results found for this or any previous visit (from the past 48 hour(s)).  Blood Alcohol level:  Lab Results  Component Value Date   ETH <10 11/21/2019   ETH <10 08/28/2019   Metabolic Disorder  Labs: Lab Results  Component Value Date   HGBA1C 5.4 11/23/2019   MPG 108.28 11/23/2019   No results found for: PROLACTIN Lab Results  Component Value Date   CHOL 185 11/23/2019   TRIG 91 11/23/2019   HDL 51 11/23/2019   CHOLHDL 3.6 11/23/2019   VLDL 18 11/23/2019   LDLCALC 116 (H) 11/23/2019    Physical Findings: AIMS: Facial and Oral Movements Muscles of Facial Expression: None, normal Lips and Perioral Area: None, normal Jaw: None, normal Tongue: None, normal,Extremity Movements Upper (arms, wrists, hands, fingers): None, normal Lower (legs, knees, ankles, toes): None, normal, Trunk Movements Neck, shoulders, hips: None, normal, Overall Severity Severity of abnormal movements (highest score from questions above): None, normal Incapacitation due to abnormal movements: None, normal Patient's awareness of abnormal movements (rate only patient's report): No Awareness, Dental Status Current problems with teeth and/or dentures?: No Does patient usually wear dentures?: No  CIWA:  CIWA-Ar Total: 0 COWS:  COWS Total Score: 1  Musculoskeletal: Strength & Muscle Tone: within normal limits Gait & Station: normal Patient leans: N/A  Psychiatric Specialty Exam: Physical Exam  Nursing note and vitals reviewed. Constitutional: He is oriented to person, place, and time. He appears well-developed and well-nourished.  Cardiovascular: Normal rate.  Respiratory: Effort normal.  Musculoskeletal:        General: Normal range of motion.  Neurological: He is alert and oriented to person, place, and time.  Skin: Skin is warm.    Review of Systems  Constitutional:  Negative.   HENT: Negative.   Eyes: Negative.   Respiratory: Negative.   Cardiovascular: Negative.   Gastrointestinal: Negative.   Genitourinary: Negative.   Musculoskeletal: Negative.   Skin: Negative.   Neurological: Negative.   Psychiatric/Behavioral: Positive for dysphoric mood ( "Improving"). Negative for agitation, behavioral problems, confusion, decreased concentration, hallucinations and self-injury. The patient is not nervous/anxious and is not hyperactive.     Blood pressure (!) 141/96, pulse 76, temperature 98.2 F (36.8 C), temperature source Oral, resp. rate 18, SpO2 100 %.There is no height or weight on file to calculate BMI.  General Appearance: Casual  Eye Contact:  Good  Speech:  Clear and Coherent and Normal Rate  Volume:  Normal  Mood:  Euthymic  Affect:  Congruent  Thought Process:  Coherent and Descriptions of Associations: Intact  Orientation:  Full (Time, Place, and Person)  Thought Content:  WDL  Suicidal Thoughts:  No  Homicidal Thoughts:  No  Memory:  Immediate;   Good Recent;   Good Remote;   Good  Judgement:  Fair  Insight:  Fair  Psychomotor Activity:  Normal  Concentration:  Concentration: Fair  Recall:  Fair  Fund of Knowledge:  Fair  Language:  Good  Akathisia:  No  Handed:  Right  AIMS (if indicated):     Assets:  Communication Skills Desire for Improvement Housing Physical Health Resilience Social Support  ADL's:  Intact  Cognition:  WNL  Sleep:  Number of Hours: 6.5   Assessment: Patient presents in the day room and is pleasant, calm, cooperative. Patient is sitting reading a magazine and does not appear to be anxious or agitated at all. Patient has not shown any signs or symptoms of psychosis. He has been interacting with peers and staff appropriately and there've been no complaints about the patient. He has been compliant with his medications and treatment. Patient has reported toleration of his medications. Will continue Effexor 75  mg daily and Neurontin 300 mg 3 times  daily. CSW is still seeking placement for residential treatment. Patient states that he feels that the medications as well as more time of being off of substances will help clear up his mind to make him feel better.  Treatment Plan Summary: Daily contact with patient to assess and evaluate symptoms and progress in treatment and Medication management.  - Continue inpatient hospitalization. - Will continue today 11/25/2019 plan as below except where it is noted.  Continue clonidine detox protocol Continue  Neurontin 300 mg p.o. 3 times daily for mood stability and withdrawal symptoms Continue Vistaril 25 mg p.o. every 6 hours as needed for anxiety Continue Ativan 1 mg every 6 hours as needed for CIWA greater than 10 Continue Remeron 15 mg p.o. nightly for sleep and mood stability Continue trazodone 50 mg p.o. nightly as needed for insomnia Continue Effexor XR 75 mg p.o. daily with breakfast for depression and anxiety Therapy participation Continue every 15 minute safety checks  Armandina Stammer, NP, PMHNP, FNP-BC. 11/25/2019, 4:17 PMPatient ID: Jared Jordan, male   DOB: 25-Dec-1990, 29 y.o.   MRN: [id number]

## 2019-11-25 NOTE — Progress Notes (Signed)
D. Pt presents as calm, cooperative, although somewhat guarded. Pt observed attending groups and interacting appropriately with peers and staff in the milieu. Pt currently denies SI/HI and AVH A. Labs and vitals monitored. Pt compliant with medications. Pt supported emotionally and encouraged to express concerns and ask questions.   R. Pt remains safe with 15 minute checks. Will continue POC.

## 2019-11-25 NOTE — Progress Notes (Signed)
The patient learned today how to work on his anger. His goal for tomorrow is to try to survive the remaining days in the hospital.
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
## 2019-11-26 NOTE — Progress Notes (Signed)
D:  Patient denied SI and HI, contracts for safety.  Denied A/V hallucinations.   A:  Medications administered per MD orders.  Emotional support and encouragement given patient. R:  Safety maintained with 15 minute checks.  

## 2019-11-26 NOTE — [facility] Group Notes (Signed)
[facility] LCSW Group Therapy Note  11/26/2019    Type of Therapy and Topic:  Group Therapy:  A Hero Worthy of Support  Participation Level:  Did Not Attend   Description of Group:  Patients in this group were introduced to the concept that additional supports including self-support are an essential part of recovery.  Matching needs with supports to help fulfill those needs was explained.  Establishing boundaries that can gradually be increased or decreased was described, with patients giving their own examples of establishing appropriate boundaries in their lives.  A song entitled "My Own Hero" was played and a group discussion ensued in which patients stated it inspired them to help themselves in order to succeed, because other people cannot achieve their goals such as sobriety or stability for them.  A song was played called "I Am Enough" which led to a discussion about being willing to believe we are worth the effort of being a self-support.   Therapeutic Goals: 1)  demonstrate the importance of being a key part of one's own support system 2)  discuss various available supports 3)  encourage patient to use music as part of their self-support and focus on goals 4)  elicit ideas from patients about supports that need to be added   Summary of Patient Progress:  The patient was invited to group and was present in the dayroom before group began, but he did not want to attend so he left the room.  Therapeutic Modalities:   Motivational Interviewing Activity  Lynnell Chad

## 2019-11-26 NOTE — Progress Notes (Signed)
   11/26/19 0117  COVID-19 Daily Checkoff  Have you had a fever (temp > 37.80C/100F)  in the past 24 hours?  No  COVID-19 EXPOSURE  Have you traveled outside the state in the past 14 days? No  Have you been in contact with someone with a confirmed diagnosis of COVID-19 or PUI in the past 14 days without wearing appropriate PPE? No  Have you been living in the same home as a person with confirmed diagnosis of COVID-19 or a PUI (household contact)? No  Have you been diagnosed with COVID-19? No

## 2019-11-26 NOTE — Progress Notes (Signed)
[facility] Group Notes:  (Nursing/MHT/Case Management/Adjunct)  Date:  11/26/2019  Time:  2030  Type of Therapy:  wrap up group  Participation Level:  Active  Participation Quality:  Appropriate, Attentive, Sharing and Supportive  Affect:  Depressed  Cognitive:  Appropriate  Insight:  Improving  Engagement in Group:  Engaged  Modes of Intervention:  Clarification, Education and Support  Summary of Progress/Problems: Positive thinking and positive change were discussed.   Marcille Buffy 11/26/2019, 9:47 PM

## 2019-11-26 NOTE — Progress Notes (Signed)
   11/26/19 2308  Psych Admission Type (Psych Patients Only)  Admission Status Voluntary  Psychosocial Assessment  Patient Complaints None  Eye Contact Fair  Facial Expression Other (Comment) (WDL)  Affect Appropriate to circumstance  Speech Logical/coherent  Interaction Assertive  Motor Activity Other (Comment) (WDL)  Appearance/Hygiene Unremarkable  Behavior Characteristics Appropriate to situation  Mood Depressed  Thought Process  Coherency WDL  Content WDL  Delusions None reported or observed  Perception WDL  Hallucination None reported or observed  Judgment Poor  Confusion None  Danger to Self  Current suicidal ideation? Denies  Danger to Others  Danger to Others None reported or observed

## 2019-11-26 NOTE — Progress Notes (Signed)
   11/26/19 2306  COVID-19 Daily Checkoff  Have you had a fever (temp > 37.80C/100F)  in the past 24 hours?  No  If you have had runny nose, nasal congestion, sneezing in the past 24 hours, has it worsened? No  COVID-19 EXPOSURE  Have you traveled outside the state in the past 14 days? No  Have you been in contact with someone with a confirmed diagnosis of COVID-19 or PUI in the past 14 days without wearing appropriate PPE? No  Have you been living in the same home as a person with confirmed diagnosis of COVID-19 or a PUI (household contact)? No  Have you been diagnosed with COVID-19? No

## 2019-11-26 NOTE — Progress Notes (Signed)
[facility] MD Progress Note  11/26/2019 11:21 AM Jared Jordan  MRN:  [id number]   Subjective: patient reports partial improvement compared to admission. He reports some lingering symptoms such as " feeling tired" and endorses some intermittent cravings. Denies suicidal ideations. Currently denies medication side effects.   Objective: I have reviewed chart notes and have met with patient . 29 year old male, history of opiate use disorder, alcohol use disorder ( identifies opiates as substance of choice ). Presented of 5/18 for depression, suicidal ideations. Stressors include death of father last year. He had been managed on Suboxone prior to admission but had stopped taking in March.   Today patient reports partially improved mood,and denies suicidal ideations, presents future oriented . States " I am starting to feel better". Does endorse some lingering symptoms such as feeling " tired " and has also experienced intermittent cravings . Currently presents calm, in no acute distress, without psychomotor restlessness or agitation. Vitals are stable . Denies medication side effects.  He is visible in day room, interacting appropriately with selected peers. Currently denies suicidal ideations and presents future oriented, expressing interest in going to rehab setting at discharge .   Principal Problem: Substance induced mood disorder (HCC) Diagnosis: Principal Problem:   Substance induced mood disorder ([place])  Total Time spent with patient: 15 minutes  Past Psychiatric History: Multiple hospitalizations and ED visits for polysubstance abuse as well as MDD and suicide attempt. In the last 6 months it appears the patient has had approximately 3 hospitalizations for mental health. He reports 2 previous suicide attempts  Past Medical History:  Past Medical History:  Diagnosis Date  . Drug use     Past Surgical History:  Procedure Laterality Date  . SHOULDER SURGERY     Family History: History  reviewed. No pertinent family history.  Family Psychiatric  History: None reported  Social History:  Social History   Substance and Sexual Activity  Alcohol Use Yes  . Alcohol/week: 4.0 - 6.0 standard drinks  . Types: 4 - 6 Cans of beer per week   Comment: 2-3 times weekly     Social History   Substance and Sexual Activity  Drug Use Not Currently  . Types: IV, Methamphetamines   Comment: using 1gm everyday heroin    Social History   Socioeconomic History  . Marital status: Single    Spouse name: Not on file  . Number of children: Not on file  . Years of education: Not on file  . Highest education level: Not on file  Occupational History  . Not on file  Tobacco Use  . Smoking status: Current Every Day Smoker    Packs/day: 0.25    Years: 1.00    Pack years: 0.25  . Smokeless tobacco: Never Used  Substance and Sexual Activity  . Alcohol use: Yes    Alcohol/week: 4.0 - 6.0 standard drinks    Types: 4 - 6 Cans of beer per week    Comment: 2-3 times weekly  . Drug use: Not Currently    Types: IV, Methamphetamines    Comment: using 1gm everyday heroin  . Sexual activity: Yes  Other Topics Concern  . Not on file  Social History Narrative  . Not on file   Social Determinants of Health   Financial Resource Strain:   . Difficulty of Paying Living Expenses:   Food Insecurity:   . Worried About Charity fundraiser in the Last Year:   . [employer] of  Food in the Last Year:   Transportation Needs:   . Film/video editor (Medical):   Marland Kitchen Lack of Transportation (Non-Medical):   Physical Activity:   . Days of Exercise per Week:   . Minutes of Exercise per Session:   Stress:   . Feeling of Stress :   Social Connections:   . Frequency of Communication with Friends and Family:   . Frequency of Social Gatherings with Friends and Family:   . Attends Religious Services:   . Active Member of Clubs or Organizations:   . Attends Archivist Meetings:   Marland Kitchen Marital  Status:    Additional Social History:   Sleep: improving   Appetite:  Improving   Current Medications: Current Facility-Administered Medications  Medication Dose Route Frequency Provider Last Rate Last Admin  . cloNIDine (CATAPRES) tablet 0.1 mg  0.1 mg Oral QAC breakfast Mordecai Maes, NP   0.1 mg at 11/26/19 0737  . dicyclomine (BENTYL) tablet 20 mg  20 mg Oral Q6H PRN Mordecai Maes, NP      . folic acid (FOLVITE) tablet 1 mg  1 mg Oral Daily Sharma Covert, MD   1 mg at 11/26/19 0737  . gabapentin (NEURONTIN) capsule 300 mg  300 mg Oral TID Money, Lowry Ram, FNP   300 mg at 11/26/19 0737  . hydrOXYzine (ATARAX/VISTARIL) tablet 25 mg  25 mg Oral Q6H PRN Mordecai Maes, NP   25 mg at 11/25/19 1710  . loperamide (IMODIUM) capsule 2-4 mg  2-4 mg Oral PRN Mordecai Maes, NP      . LORazepam (ATIVAN) tablet 1 mg  1 mg Oral Q6H PRN Sharma Covert, MD      . methocarbamol (ROBAXIN) tablet 500 mg  500 mg Oral Q8H PRN Mordecai Maes, NP      . mirtazapine (REMERON) tablet 15 mg  15 mg Oral QHS Sharma Covert, MD   15 mg at 11/25/19 2119  . naproxen (NAPROSYN) tablet 500 mg  500 mg Oral BID PRN Mordecai Maes, NP   500 mg at 11/24/19 0904  . nicotine (NICODERM CQ - dosed in mg/24 hours) patch 21 mg  21 mg Transdermal Daily Money, Lowry Ram, FNP   21 mg at 11/26/19 0738  . ondansetron (ZOFRAN-ODT) disintegrating tablet 4 mg  4 mg Oral Q6H PRN Mordecai Maes, NP      . thiamine tablet 100 mg  100 mg Oral Daily Sharma Covert, MD   100 mg at 11/26/19 0738  . traZODone (DESYREL) tablet 50 mg  50 mg Oral QHS PRN Sharma Covert, MD   50 mg at 11/25/19 2121  . venlafaxine XR (EFFEXOR-XR) 24 hr capsule 75 mg  75 mg Oral Q breakfast Money, Lowry Ram, FNP   75 mg at 11/26/19 6712   Lab Results:  No results found for this or any previous visit (from the past 48 hour(s)).  Blood Alcohol level:  Lab Results  Component Value Date   ETH <10 11/21/2019   ETH <10 45/80/9983    Metabolic Disorder Labs: Lab Results  Component Value Date   HGBA1C 5.4 11/23/2019   MPG 108.28 11/23/2019   No results found for: PROLACTIN Lab Results  Component Value Date   CHOL 185 11/23/2019   TRIG 91 11/23/2019   HDL 51 11/23/2019   CHOLHDL 3.6 11/23/2019   VLDL 18 11/23/2019   LDLCALC 116 (H) 11/23/2019    Physical Findings: AIMS: Facial and Oral Movements Muscles of Facial  Expression: None, normal Lips and Perioral Area: None, normal Jaw: None, normal Tongue: None, normal,Extremity Movements Upper (arms, wrists, hands, fingers): None, normal Lower (legs, knees, ankles, toes): None, normal, Trunk Movements Neck, shoulders, hips: None, normal, Overall Severity Severity of abnormal movements (highest score from questions above): None, normal Incapacitation due to abnormal movements: None, normal Patient's awareness of abnormal movements (rate only patient's report): No Awareness, Dental Status Current problems with teeth and/or dentures?: No Does patient usually wear dentures?: No  CIWA:  CIWA-Ar Total: 0 COWS:  COWS Total Score: 1  Musculoskeletal: Strength & Muscle Tone: within normal limits Gait & Station: normal Patient leans: N/A  Psychiatric Specialty Exam: Physical Exam  Nursing note and vitals reviewed. Constitutional: He is oriented to person, place, and time. He appears well-developed and well-nourished.  Cardiovascular: Normal rate.  Respiratory: Effort normal.  Musculoskeletal:        General: Normal range of motion.  Neurological: He is alert and oriented to person, place, and time.  Skin: Skin is warm.    Review of Systems  Constitutional: Negative.   HENT: Negative.   Eyes: Negative.   Respiratory: Negative.   Cardiovascular: Negative.   Gastrointestinal: Negative.   Genitourinary: Negative.   Musculoskeletal: Negative.   Skin: Negative.   Neurological: Negative.   Psychiatric/Behavioral: Positive for dysphoric mood ( "Improving").  Negative for agitation, behavioral problems, confusion, decreased concentration, hallucinations and self-injury. The patient is not nervous/anxious and is not hyperactive.   denies chest pain or shortness of breath, no cough, no vomiting   Blood pressure 131/90, pulse 75, temperature 97.7 F (36.5 C), temperature source Oral, resp. rate 18, SpO2 100 %.There is no height or weight on file to calculate BMI.  General Appearance: Casual  Eye Contact:  Good  Speech:  Normal Rate  Volume:  Normal  Mood:  improving mood   Affect:  vaguely constricted but improves during session, smiles at times appropriately  Thought Process:  Linear and Descriptions of Associations: Intact  Orientation:  Other:  fully alert and attentive  Thought Content:  no hallucinations, no delusions   Suicidal Thoughts:  No currently denies suicidal or self injurious ideations, denies homicidal or violent ideations  Homicidal Thoughts:  No  Memory:  recent and remote grossly intact   Judgement:  Other:  improving  Insight:  Fair and improving   Psychomotor Activity:  Normal- no psychomotor restlessness or agitation  Concentration:  Concentration: Good and Attention Span: Good  Recall:  Good  Fund of Knowledge:  Good  Language:  Good  Akathisia:  No  Handed:  Right  AIMS (if indicated):     Assets:  Communication Skills Desire for Improvement Housing Physical Health Resilience Social Support  ADL's:  Intact  Cognition:  WNL  Sleep:  Number of Hours: 6   Assessment:  29 year old male, history of opiate use disorder, alcohol use disorder ( identifies opiates as substance of choice ). Presented of 5/18 for depression, suicidal ideations. Stressors include death of father last year. He had been managed on Suboxone prior to admission but had stopped taking in March.  Patient is reporting feeling better than on admission , does not endorse significant WDL symptoms at this time and presents calm, comfortable , in no  acute distress, with stable vital signs . Denies medication side effects ( currently on Remeron,Effexor XR, Neurontin ) .  Interested in going to a rehab setting at discharge.  Treatment Plan Summary: Daily contact with patient to assess  and evaluate symptoms and progress in treatment and Medication management. Treatment Plan reviewed as below today 5/23  Completing Clonidine detox protocol for opiate WDL  Continue  Neurontin 300 mg p.o. 3 times daily for anxiety Continue Vistaril 25 mg p.o. every 6 hours as needed for anxiety Continue Ativan 1 mg every 6 hours as needed for CIWA greater than 10 Continue Remeron 15 mg p.o. nightly for sleep and mood stability D/C Trazodone  Continue Effexor XR 75 mg p.o. daily with breakfast for depression and anxiety Treatment team working on disposition planning options- interested in going to rehab setting after discharge.  Jared Campus, MD 11/26/2019, 11:21 AM   Patient ID: Margit Banda, male   DOB: 23-Aug-1990, 29 y.o.   MRN: [id number]

## 2019-11-26 NOTE — Progress Notes (Signed)
Pt has been pleasant and cooperative with staff. Pt interacts well with his peers and in the milieu environment. Actively participates in groups and is assertive with his needs. He requested his PRN trazadone last night for difficulty sleeping, which was effective. He denies any withdrawal symptoms besides some mild anxiety and a runny nose with some sneezing. Medications were administered as ordered by MD and no adverse effects were noted. He denies AVH and SI/HI. Active listening, reassurance, and support were provided. Q 15 minute safety checks continue. Safety has been maintained.    11/25/19 2025  Psych Admission Type (Psych Patients Only)  Admission Status Voluntary  Psychosocial Assessment  Patient Complaints Sleep disturbance;Anxiety  Eye Contact Fair  Facial Expression Other (Comment) (appropriate, WNL)  Affect Appropriate to circumstance  Speech Logical/coherent  Interaction Assertive  Motor Activity Other (Comment) (WNL)  Appearance/Hygiene Unremarkable  Behavior Characteristics Cooperative;Appropriate to situation  Mood Pleasant  Thought Process  Coherency WDL  Content WDL  Delusions None reported or observed  Perception WDL  Hallucination None reported or observed  Judgment Poor  Confusion None  Danger to Self  Current suicidal ideation? Denies  Danger to Others  Danger to Others None reported or observed

## 2019-11-26 NOTE — [facility] Counselor (Signed)
CSW called and spoke with Admissions at [facility]. Patient is confirmed to be accepted for treatment, but a bed is not available yet.  Patient was also referred to and accepted by [facility], but there was not an available bed as on Friday. CSW team will follow up during the week regarding bed availability.  Enid Cutter, MSW, LCSW-A Clinical Social Worker [facility]

## 2019-11-27 MED ORDER — VENLAFAXINE HCL ER 75 MG PO CP24: 75.0000 mg | ORAL_CAPSULE | Freq: Every day | ORAL | 0 refills | Status: DC

## 2019-11-27 MED ORDER — MIRTAZAPINE 15 MG PO TABS: 15.0000 mg | ORAL_TABLET | Freq: Every day | ORAL | 0 refills | Status: DC

## 2019-11-27 MED ORDER — TRAZODONE HCL 50 MG PO TABS: 50.0000 mg | ORAL_TABLET | Freq: Every evening | ORAL | 0 refills | Status: DC | PRN

## 2019-11-27 MED ORDER — GABAPENTIN 300 MG PO CAPS: 300.0000 mg | ORAL_CAPSULE | Freq: Three times a day (TID) | ORAL | 0 refills | Status: DC

## 2019-11-27 NOTE — Progress Notes (Signed)
Discharge Note:  Patient discharged home with friend.  Suicide prevention information given and discussed with patient who stated he understood and had no questions.  Patient stated he received all his belongings, clothing, toiletries, miscellaneous items, etc.  Patient stated he appreciated all assistance received from [facility] staff.  All required discharge information given to patient at discharge.                     

## 2019-11-27 NOTE — [facility] Suicide Risk Assessment (Signed)
[facility] Discharge Suicide Risk Assessment   Principal Problem: Substance induced mood disorder (HCC) Discharge Diagnoses: Principal Problem:   Substance induced mood disorder (HCC)   Total Time spent with patient: 15 minutes  Musculoskeletal: Strength & Muscle Tone: within normal limits Gait & Station: normal Patient leans: N/A  Psychiatric Specialty Exam: Review of Systems  All other systems reviewed and are negative.   Blood pressure 126/85, pulse 94, temperature (!) 97.4 F (36.3 C), temperature source Oral, resp. rate 16, SpO2 100 %.There is no height or weight on file to calculate BMI.  General Appearance: Casual  Eye Contact::  Good  Speech:  Normal Rate409  Volume:  Normal  Mood:  Euthymic  Affect:  Congruent  Thought Process:  Coherent and Descriptions of Associations: Intact  Orientation:  Full (Time, Place, and Person)  Thought Content:  Logical  Suicidal Thoughts:  No  Homicidal Thoughts:  No  Memory:  Immediate;   Fair Recent;   Fair Remote;   Fair  Judgement:  Intact  Insight:  Fair  Psychomotor Activity:  Normal  Concentration:  Fair  Recall:  [employer] of Knowledge:Good  Language: Good  Akathisia:  Negative  Handed:  Right  AIMS (if indicated):     Assets:  Desire for Improvement Housing Resilience  Sleep:  Number of Hours: 6  Cognition: WNL  ADL's:  Intact   Mental Status Per Nursing Assessment::   On Admission:  NA  Demographic Factors:  Male, Caucasian, Low socioeconomic status, Living alone and Unemployed  Loss Factors: NA  Historical Factors: Impulsivity  Risk Reduction Factors:   Positive coping skills or problem solving skills  Continued Clinical Symptoms:  Depression:   Comorbid alcohol abuse/dependence Impulsivity Alcohol/Substance Abuse/Dependencies  Cognitive Features That Contribute To Risk:  None    Suicide Risk:  Minimal: No identifiable suicidal ideation.  Patients presenting with no risk factors but with morbid  ruminations; may be classified as minimal risk based on the severity of the depressive symptoms  Follow-up Information    [name] Follow up.   Why: Your screening for admission appointment is Tuesday Contact information: [address] [place] [address] [phone]        [facility] Follow up.   Why: For outpatient medication management and therapy services, please go to agency during their walk-in hours. Walk-in hours are Monday-Friday from 8:00am-5:00pm. Be sure to bring any discharge paperwork from this hospitalization, including your list of meds Contact information: [address] [place] [address] [phone]           Plan Of Care/Follow-up recommendations:  Activity:  ad lib  Antonieta Pert, MD 11/27/2019, 9:26 AM
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
## 2019-11-27 NOTE — Progress Notes (Signed)
Recreation Therapy Notes  Date:  5.24.21 Time: 0930 Location: [address] Dayroom  Group Topic: Stress Management  Goal Area(s) Addresses:  Patient will identify positive stress management techniques. Patient will identify benefits of using stress management post d/c.  Intervention: Stress Management  Activity: Mountain Meditation.  LRT played a meditation that focused on taking the characteristics of a mountain into your meditation and life.  Patients were to listen and follow along as meditation played.    Education:  Stress Management, Discharge Planning.   Education Outcome: Acknowledges Education  Clinical Observations/Feedback: Pt did not attend group activity.    Jahi Roza, LRT/CTRS         Nicey Krah A 11/27/2019 11:19 AM 

## 2019-11-27 NOTE — Progress Notes (Signed)
D:  Patient denied SI and HI, contracts for safety.  Denied A/V hallucinations.  Denied pain. A:  Medications administered per MD orders.  Emotional support and encouragement given patient. R:  Safety maintained with 15 minute checks.  

## 2019-11-27 NOTE — Progress Notes (Signed)
  [facility] Adult Case Management Discharge Plan :  Will you be returning to the same living situation after discharge:  Yes,  patient is returning to his apartment  At discharge, do you have transportation home?: Yes,  CSW provided bus passes Do you have the ability to pay for your medications: No.  Release of information consent forms completed and in the chart;  Patient's signature needed at discharge.  Patient to Follow up at: Follow-up Information    [name]. Go on 11/28/2019.   Why: Your screening for admission appointment is Tuesday, 11/28/2019 at 7:45am. Please be sure to bring your discharge paperwork, including presciptions, any belongings (clothing) and any of your medications. Please call agency for any additional questions.  Contact information: [address] [place] [address] [phone]        [facility]. Go to.   Why: Upon completion of residential program, for med management and therapy services, please go to agency during their walk-in hours:Monday-Friday from 8:00am-5:00pm. Be sure to bring any discharge paperwork from this hospitalization, including medications.  Contact information: [address] [place] [address] [phone]           Next level of care provider has access to [facility] Link:yes  Safety Planning and Suicide Prevention discussed: Yes,  with the patient     Has patient been referred to the Quitline?: Patient refused referral  Patient has been referred for addiction treatment: Yes  Maeola Sarah, LCSWA 11/27/2019, 10:49 AM

## 2019-11-27 NOTE — Discharge Summary (Signed)
Physician Discharge Summary Note  Patient:  Jared Jordan is an 29 y.o., male MRN:  [id number] DOB:  05-05-1991 Patient phone:  [phone] (home)  Patient address:   [place] [address],  Total Time spent with patient: 30 minutes  Date of Admission:  11/21/2019 Date of Discharge: 11/27/19  Reason for Admission:  29 year old male with a reported past psychiatric history significant for opiate dependence, alcohol dependence, depression who presented to the [facility] emergency department on 11/21/2019 with suicidal ideation. The patient stated he went to a bar last night to drink alcohol because he was depressed. He is thought the alcohol would help. He then went to a parking garage and was thinking about jumping off.  Principal Problem: Substance induced mood disorder [facility]) Discharge Diagnoses: Principal Problem:   Substance induced mood disorder ([place])   Past Psychiatric History: Multiple hospitalizations and ED visits for polysubstance abuse as well as MDD and suicide attempt.  In the last 6 months it appears the patient has had approximately 3 hospitalizations for mental health.  He reports 2 previous suicide attempts  Past Medical History:  Past Medical History:  Diagnosis Date  . Drug use     Past Surgical History:  Procedure Laterality Date  . SHOULDER SURGERY     Family History: History reviewed. No pertinent family history. Family Psychiatric  History: None reported Social History:  Social History   Substance and Sexual Activity  Alcohol Use Yes  . Alcohol/week: 4.0 - 6.0 standard drinks  . Types: 4 - 6 Cans of beer per week   Comment: 2-3 times weekly     Social History   Substance and Sexual Activity  Drug Use Not Currently  . Types: IV, Methamphetamines   Comment: using 1gm everyday heroin    Social History   Socioeconomic History  . Marital status: Single    Spouse name: Not on file  . Number of children: Not  on file  . Years of education: Not on file  . Highest education level: Not on file  Occupational History  . Not on file  Tobacco Use  . Smoking status: Current Every Day Smoker    Packs/day: 0.25    Years: 1.00    Pack years: 0.25  . Smokeless tobacco: Never Used  Substance and Sexual Activity  . Alcohol use: Yes    Alcohol/week: 4.0 - 6.0 standard drinks    Types: 4 - 6 Cans of beer per week    Comment: 2-3 times weekly  . Drug use: Not Currently    Types: IV, Methamphetamines    Comment: using 1gm everyday heroin  . Sexual activity: Yes  Other Topics Concern  . Not on file  Social History Narrative  . Not on file   Social Determinants of Health   Financial Resource Strain:   . Difficulty of Paying Living Expenses:   Food Insecurity:   . Worried About Charity fundraiser in the Last Year:   . Arboriculturist in the Last Year:   Transportation Needs:   . Film/video editor (Medical):   Marland Kitchen Lack of Transportation (Non-Medical):   Physical Activity:   . Days of Exercise per Week:   . Minutes of Exercise per Session:   Stress:   . Feeling of Stress :   Social Connections:   . Frequency of Communication with Friends and Family:   . Frequency of Social Gatherings with Friends and Family:   .  Attends Religious Services:   . Active Member of Clubs or Organizations:   . Attends Banker Meetings:   Marland Kitchen Marital Status:     Hospital Course:  Patient remained on the [facility] unit for 5 days. The patient stabilized on medication and therapy. Patient was discharged on Neurontin 300 mg TID, Remeron 15 mg QHS, Effexor-XR 75 mg Daily. Patient also completed clonidine and ativan detox protocols during admission. Patient has shown improvement with improved mood, affect, sleep, appetite, and interaction. Patient has attended group and participated. Patient has been seen in the day room interacting with peers and staff appropriately. Patient denies any SI/HI/AVH and contracts for  safety. Patient agrees to follow up at [facility] and or [facility]. Patient is provided with prescriptions for their medications upon discharge.  Physical Findings: AIMS: Facial and Oral Movements Muscles of Facial Expression: None, normal Lips and Perioral Area: None, normal Jaw: None, normal Tongue: None, normal,Extremity Movements Upper (arms, wrists, hands, fingers): None, normal Lower (legs, knees, ankles, toes): None, normal, Trunk Movements Neck, shoulders, hips: None, normal, Overall Severity Severity of abnormal movements (highest score from questions above): None, normal Incapacitation due to abnormal movements: None, normal Patient's awareness of abnormal movements (rate only patient's report): No Awareness, Dental Status Current problems with teeth and/or dentures?: No Does patient usually wear dentures?: No  CIWA:  CIWA-Ar Total: 0 COWS:  COWS Total Score: 1  Musculoskeletal: Strength & Muscle Tone: within normal limits Gait & Station: normal Patient leans: N/A  Psychiatric Specialty Exam: Physical Exam  Nursing note and vitals reviewed. Constitutional: He is oriented to person, place, and time. He appears well-developed and well-nourished.  Cardiovascular: Normal rate.  Respiratory: Effort normal.  Musculoskeletal:        General: Normal range of motion.  Neurological: He is alert and oriented to person, place, and time.  Skin: Skin is warm.    Review of Systems  Constitutional: Negative.   HENT: Negative.   Eyes: Negative.   Respiratory: Negative.   Cardiovascular: Negative.   Gastrointestinal: Negative.   Genitourinary: Negative.   Musculoskeletal: Negative.   Skin: Negative.   Neurological: Negative.   Psychiatric/Behavioral: Negative.     Blood pressure 126/85, pulse 94, temperature (!) 97.4 F (36.3 C), temperature source Oral, resp. rate 16, SpO2 100 %.There is no height or weight on file to calculate BMI.   General Appearance: Casual  Eye  Contact::  Good  Speech:  Normal Rate409  Volume:  Normal  Mood:  Euthymic  Affect:  Congruent  Thought Process:  Coherent and Descriptions of Associations: Intact  Orientation:  Full (Time, Place, and Person)  Thought Content:  Logical  Suicidal Thoughts:  No  Homicidal Thoughts:  No  Memory:  Immediate;   Fair Recent;   Fair Remote;   Fair  Judgement:  Intact  Insight:  Fair  Psychomotor Activity:  Normal  Concentration:  Fair  Recall:  [employer] of Knowledge:Good  Language: Good  Akathisia:  Negative  Handed:  Right  AIMS (if indicated):     Assets:  Desire for Improvement Housing Resilience  Sleep:  Number of Hours: 6  Cognition: WNL  ADL's:  Intact      Has this patient used any form of tobacco in the last 30 days? (Cigarettes, Smokeless Tobacco, Cigars, and/or Pipes) Yes, No  Blood Alcohol level:  Lab Results  Component Value Date   ETH <10 11/21/2019   ETH <10 08/28/2019    Metabolic Disorder Labs:  Lab Results  Component Value Date   HGBA1C 5.4 11/23/2019   MPG 108.28 11/23/2019   No results found for: PROLACTIN Lab Results  Component Value Date   CHOL 185 11/23/2019   TRIG 91 11/23/2019   HDL 51 11/23/2019   CHOLHDL 3.6 11/23/2019   VLDL 18 11/23/2019   LDLCALC 116 (H) 11/23/2019    See Psychiatric Specialty Exam and Suicide Risk Assessment completed by Attending Physician prior to discharge.  Discharge destination:  Home  Is patient on multiple antipsychotic therapies at discharge:  No   Has Patient had three or more failed trials of antipsychotic monotherapy by history:  No  Recommended Plan for Multiple Antipsychotic Therapies: NA   Allergies as of 11/27/2019   No Known Allergies     Medication List    TAKE these medications     Indication  gabapentin 300 MG capsule Commonly known as: NEURONTIN Take 1 capsule (300 mg total) by mouth 3 (three) times daily.  Indication: mood stability   mirtazapine 15 MG tablet Commonly known  as: REMERON Take 1 tablet (15 mg total) by mouth at bedtime.  Indication: Major Depressive Disorder, sleep   traZODone 50 MG tablet Commonly known as: DESYREL Take 1 tablet (50 mg total) by mouth at bedtime as needed for sleep.  Indication: Trouble Sleeping   venlafaxine XR 75 MG 24 hr capsule Commonly known as: EFFEXOR-XR Take 1 capsule (75 mg total) by mouth daily with breakfast. Start taking on: Nov 28, 2019  Indication: Major Depressive Disorder      Follow-up Information    Services, [name]. Go on 11/28/2019.   Why: Your screening for admission appointment is Tuesday, 11/28/2019 at 7:45am. Please be sure to bring your discharge paperwork, including presciptions, any belongings (clothing) and any of your medications. Please call agency for any additional questions.  Contact information: [address] [place] [address] [phone]        [facility]. Go to.   Why: Upon completion of residential program, for med management and therapy services, please go to agency during their walk-in hours:Monday-Friday from 8:00am-5:00pm. Be sure to bring any discharge paperwork from this hospitalization, including medications.  Contact information: [address] [place] [address] [phone]           Follow-up recommendations:  Continue activity as tolerated. Continue diet as recommended by your PCP. Ensure to keep all appointments with outpatient providers.  Comments:  Patient is instructed prior to discharge to: Take all medications as prescribed by his/her mental healthcare provider. Report any adverse effects and or reactions from the medicines to his/her outpatient provider promptly. Patient has been instructed & cautioned: To not engage in alcohol and or illegal drug use while on prescription medicines. In the event of worsening symptoms, patient is instructed to call the crisis hotline, 911 and or go to the nearest ED for appropriate evaluation and  treatment of symptoms. To follow-up with his/her primary care provider for your other medical issues, concerns and or health care needs.    Signed: Gerlene Burdock Torianna Junio, FNP 11/27/2019, 9:43 AM

## 2020-02-26 ENCOUNTER — Emergency Department (HOSPITAL_COMMUNITY)
Admission: EM | Admit: 2020-02-26 | Discharge: 2020-02-27 | Disposition: A | Discharge status: Home / Self Care | Payer: Medicaid Other | Attending: Emergency Medicine | Admitting: Emergency Medicine

## 2020-02-26 ENCOUNTER — Other Ambulatory Visit: Payer: Self-pay

## 2020-02-26 ENCOUNTER — Encounter (HOSPITAL_COMMUNITY): Payer: Self-pay

## 2020-02-26 DIAGNOSIS — R0681 Apnea, not elsewhere classified: Secondary | ICD-10-CM | POA: Insufficient documentation

## 2020-02-26 DIAGNOSIS — Z79899 Other long term (current) drug therapy: Secondary | ICD-10-CM | POA: Insufficient documentation

## 2020-02-26 DIAGNOSIS — T402X1A Poisoning by other opioids, accidental (unintentional), initial encounter: Secondary | ICD-10-CM | POA: Insufficient documentation

## 2020-02-26 DIAGNOSIS — M549 Dorsalgia, unspecified: Secondary | ICD-10-CM | POA: Insufficient documentation

## 2020-02-26 DIAGNOSIS — F17200 Nicotine dependence, unspecified, uncomplicated: Secondary | ICD-10-CM | POA: Insufficient documentation

## 2020-02-26 DIAGNOSIS — T40601A Poisoning by unspecified narcotics, accidental (unintentional), initial encounter: Secondary | ICD-10-CM

## 2020-02-26 NOTE — ED Provider Notes (Signed)
[facility]-EMERGENCY DEPT Provider Note   CSN: [id number] Arrival date & time: 02/26/20  2214     History Chief Complaint  Patient presents with  . Drug Overdose    Jared Jordan is a 29 y.o. male with a history of IVDU, opioid use disorder, methamphetamine use disorder, alcohol use disorder, depression who presents to the emergency department by EMS with a chief complaint of overdose.  EMS reports they were called out to the patient's place of employment and the patient was found to be apneic.  The fire department administered 2 mg of Narcan intranasally with resolution of apnea.  In the ER, the patient is alert and oriented.  Reports that he took 2 tablets of Percocet 10-325 mg by mouth at approximately 16:30 for back pain prior to going into work at 17:00 that were given to him by a friend.  Reports that he was mopping and it was very hot at the facility and cannot recall any details until EMS arrived.  He reports that he has not had any IV drug use since May.  Reports that he has not had any opioid use prior to today since May or June.  No alcohol use today, but he does report social alcohol use.  He is a current, every day tobacco user, but denies other illicit or recreational substance use at this time.   He denies SI, HI, or auditory or visual hallucinations.  He reports that he did not intend to overdose.  He reports that he injured his back at work few weeks ago when he picked up a heavy object.  He reports that back pain has been improving since onset.  Reports that he has not treated his symptoms with any other medications aside from the Percocet earlier today.  He is unable to state why he took pain medication today when he reports back pain has significantly improved.  No fevers, chills, urinary or fecal incontinence, numbness, weakness, abdominal pain, hematuria, urinary frequency or hesitancy, chest pain, or shortness of breath.   The history is  provided by the patient, medical records and the EMS personnel. No language interpreter was used.       Past Medical History:  Diagnosis Date  . Drug use     Patient Active Problem List   Diagnosis Date Noted  . Substance induced mood disorder ([facility]) 11/22/2019    Past Surgical History:  Procedure Laterality Date  . SHOULDER SURGERY         No family history on file.  Social History   Tobacco Use  . Smoking status: Current Every Day Smoker    Packs/day: 0.25    Years: 1.00    Pack years: 0.25  . Smokeless tobacco: Never Used  Vaping Use  . Vaping Use: Some days  . Start date: 11/04/2018  . Devices: Jewel Menthol  Substance Use Topics  . Alcohol use: Yes    Alcohol/week: 4.0 - 6.0 standard drinks    Types: 4 - 6 Cans of beer per week    Comment: 2-3 times weekly  . Drug use: Not Currently    Types: IV, Methamphetamines    Comment: using 1gm everyday heroin    Home Medications Prior to Admission medications   Medication Sig Start Date End Date Taking? Authorizing Provider  gabapentin (NEURONTIN) 300 MG capsule Take 1 capsule (300 mg total) by mouth 3 (three) times daily. 11/27/19   Money, Gerlene Burdock, FNP  mirtazapine (REMERON) 15 MG tablet Take  1 tablet (15 mg total) by mouth at bedtime. 11/27/19   Money, Gerlene Burdock, FNP  traZODone (DESYREL) 50 MG tablet Take 1 tablet (50 mg total) by mouth at bedtime as needed for sleep. 11/27/19   Money, Gerlene Burdock, FNP  venlafaxine XR (EFFEXOR-XR) 75 MG 24 hr capsule Take 1 capsule (75 mg total) by mouth daily with breakfast. 11/28/19   Money, Gerlene Burdock, FNP    Allergies    Patient has no known allergies.  Review of Systems   Review of Systems  Constitutional: Negative for appetite change, chills and fever.  Respiratory: Positive for apnea. Negative for cough, choking, shortness of breath and wheezing.   Cardiovascular: Negative for chest pain.  Gastrointestinal: Negative for abdominal pain, diarrhea, nausea and vomiting.    Genitourinary: Negative for dysuria and hematuria.  Musculoskeletal: Positive for back pain. Negative for myalgias, neck pain and neck stiffness.  Skin: Negative for color change, rash and wound.  Allergic/Immunologic: Negative for immunocompromised state.  Neurological: Negative for seizures, syncope, weakness, numbness and headaches.  Psychiatric/Behavioral: Negative for confusion. The patient is not nervous/anxious.     Physical Exam Updated Vital Signs BP (!) 144/74   Pulse 84   Temp 98.3 F (36.8 C) (Oral)   Resp 18   Ht 5\' 10"  (1.778 m)   Wt 77.1 kg   SpO2 99%   BMI 24.39 kg/m   Physical Exam Vitals and nursing note reviewed.  Constitutional:      General: He is not in acute distress.    Appearance: He is well-developed. He is not ill-appearing, toxic-appearing or diaphoretic.  HENT:     Head: Normocephalic.  Eyes:     Conjunctiva/sclera: Conjunctivae normal.  Cardiovascular:     Rate and Rhythm: Normal rate and regular rhythm.     Pulses: Normal pulses.     Heart sounds: Normal heart sounds. No murmur heard.  No friction rub. No gallop.   Pulmonary:     Effort: Pulmonary effort is normal. No respiratory distress.     Breath sounds: No stridor. No wheezing, rhonchi or rales.  Chest:     Chest wall: No tenderness.  Abdominal:     General: There is no distension.     Palpations: Abdomen is soft. There is no mass.     Tenderness: There is no abdominal tenderness. There is no right CVA tenderness, left CVA tenderness, guarding or rebound.     Hernia: No hernia is present.  Musculoskeletal:     Cervical back: Neck supple.     Comments: No tenderness to the cervical, thoracic, or lumbar bilateral paraspinal muscles or spinous processes.  No crepitus or step-offs.  No overlying erythema, edema, or warmth.  Skin:    General: Skin is warm and dry.  Neurological:     Mental Status: He is alert.     Comments: Alert and oriented x4.  Moves all 4 extremities  spontaneously.  Ambulates without difficulty.  5-5 strength against resistance of the bilateral upper and lower extremities.  Sensation is intact and equal throughout.  Psychiatric:        Behavior: Behavior normal.     ED Results / Procedures / Treatments   Labs (all labs ordered are listed, but only abnormal results are displayed) Labs Reviewed - No data to display  EKG None  Radiology No results found.  Procedures Procedures (including critical care time)  Medications Ordered in ED Medications - No data to display  ED Course  I have  reviewed the triage vital signs and the nursing notes.  Pertinent labs & imaging results that were available during my care of the patient were reviewed by me and considered in my medical decision making (see chart for details).    MDM Rules/Calculators/A&P                          29 year old male with a history of IVDU, opioid use disorder, methamphetamine use disorder, alcohol use disorder, depression brought in by EMS after he was found to be apneic prior to arrival.  He was given intranasal Narcan with significant improvement.  He is alert and oriented without increased work of breathing or neurologic deficits in the ER.  Vital signs are normal.  Reports that he took 20 mg of Percocet at approximately 16:30 that was obtained from a friend.  He adamantly denies any other illicit or recreational substance use despite requiring Narcan administered 5 hours after ingestion.  He denies intentional overdose, SI, HI, or AVN.  Reports this was taken for back pain, but reports this has been significantly improving over the last few weeks after he injured his back at work.  Physical exam is reassuring.  He has no tenderness to the spine and is neurologically intact.  Afebrile.  Doubt epidural abscess, myositis, endocarditis, occult fracture, transverse myelitis, or cauda equina.  Recommended OTC analgesia and follow-up with primary care.  Patient was  observed for more than 2 hours and did not require additional doses of Narcan.  He remained alert with a GCS of 15.  At this time, no further urgent or emergent work-up is indicated.  He had no complaints at discharge.  ER return precautions given.  He is hemodynamically stable and in no acute distress.  Safe for discharge home with outpatient follow-up as needed.  Final Clinical Impression(s) / ED Diagnoses Final diagnoses:  Opiate overdose, accidental or unintentional, initial encounter [facility])    Rx / DC Orders ED Discharge Orders    None       Barkley Boards, PA-C 02/27/20 0339    Mesner, Barbara Cower, MD 02/27/20 [phone]

## 2020-02-26 NOTE — ED Triage Notes (Signed)
Pt reports taking 2 percocet's at home. Per Ems pt apneic at bar and FD administered 2mg  narcan IN with improvement observed.

## 2020-02-27 NOTE — Discharge Instructions (Signed)
Thank you for allowing me to care for you today in the Emergency Department.   You should not take medications that do not belong to.  Opioids should not be taken if you have to drive or work as they can cause you to be impaired.  Opioids should also not be taken with any substances, including muscle relaxers or alcohol, that can cause you to be more drowsy.  Take 650 mg of Tylenol or 600 mg of ibuprofen with food every 6 hours for back pain.  You can alternate between these 2 medications every 3 hours if your pain returns.  For instance, you can take Tylenol at noon, followed by a dose of ibuprofen at 3, followed by second dose of Tylenol and 6.  Follow-up with primary care if back pain does not improve.  If you not have a primary care clinician, call the number on your discharge paperwork to get established with 1.  Return the emergency department if you stop breathing, if you pass out, if you develop significantly worsening back pain with a fever, new numbness or weakness, if you have episodes where you pee or poop on yourself, or develop other new, concerning symptoms.

## 2020-04-10 ENCOUNTER — Emergency Department (HOSPITAL_COMMUNITY)
Admission: EM | Admit: 2020-04-10 | Discharge: 2020-04-10 | Disposition: A | Discharge status: Home / Self Care | Payer: Medicaid Other | Attending: Emergency Medicine | Admitting: Emergency Medicine

## 2020-04-10 ENCOUNTER — Encounter (HOSPITAL_COMMUNITY): Payer: Self-pay | Admitting: *Deleted

## 2020-04-10 ENCOUNTER — Other Ambulatory Visit: Payer: Self-pay

## 2020-04-10 DIAGNOSIS — Z5321 Procedure and treatment not carried out due to patient leaving prior to being seen by health care provider: Secondary | ICD-10-CM | POA: Insufficient documentation

## 2020-04-10 DIAGNOSIS — F17200 Nicotine dependence, unspecified, uncomplicated: Secondary | ICD-10-CM | POA: Insufficient documentation

## 2020-04-10 DIAGNOSIS — R000 Tachycardia, unspecified: Secondary | ICD-10-CM | POA: Insufficient documentation

## 2020-04-10 DIAGNOSIS — T402X1A Poisoning by other opioids, accidental (unintentional), initial encounter: Secondary | ICD-10-CM | POA: Insufficient documentation

## 2020-04-10 DIAGNOSIS — R4020 Unspecified coma: Secondary | ICD-10-CM | POA: Insufficient documentation

## 2020-04-10 DIAGNOSIS — T50904A Poisoning by unspecified drugs, medicaments and biological substances, undetermined, initial encounter: Secondary | ICD-10-CM

## 2020-04-10 NOTE — ED Provider Notes (Cosign Needed)
[facility] EMERGENCY DEPARTMENT Provider Note   CSN: [id number] Arrival date & time: 04/10/20  1643     History Chief Complaint  Patient presents with  . Drug Overdose    Bevan Disney is a 29 y.o. male.  HPI   29 year old male with a history of substance use, who presents the emergency department today for evaluation of an overdose.  Patient was at his place of employment when he was found on the ground unresponsive.  Per EMS they administered 1 of intranasal Narcan and he did not respond.  They administered an additional dose of intranasal Narcan and patient immediately returned to baseline.  He had a normal neuro exam and no evidence of injuries on their arrival.  On my evaluation patient states that he wants to leave.  He states that he took her normal dose of his antidepressant, antianxiety medication and muscle relaxer today.  He also states he thinks he may have accidentally taken a Percocet from when he had a shoulder surgery.  He denies any IV drug use today and is requesting to leave multiple times.  Past Medical History:  Diagnosis Date  . Drug use     Patient Active Problem List   Diagnosis Date Noted  . Substance induced mood disorder (HCC) 11/22/2019    Past Surgical History:  Procedure Laterality Date  . SHOULDER SURGERY    . SHOULDER SURGERY Left 2020       No family history on file.  Social History   Tobacco Use  . Smoking status: Current Every Day Smoker    Packs/day: 0.25    Years: 1.00    Pack years: 0.25  . Smokeless tobacco: Never Used  Vaping Use  . Vaping Use: Some days  . Start date: 11/04/2018  . Devices: Jewel Menthol  Substance Use Topics  . Alcohol use: Yes    Alcohol/week: 4.0 - 6.0 standard drinks    Types: 4 - 6 Cans of beer per week    Comment: 2-3 times weekly  . Drug use: Not Currently    Types: IV, Methamphetamines    Comment: using 1gm everyday heroin    Home Medications Prior to Admission  medications   Medication Sig Start Date End Date Taking? Authorizing Provider  gabapentin (NEURONTIN) 300 MG capsule Take 1 capsule (300 mg total) by mouth 3 (three) times daily. 11/27/19   Money, Gerlene Burdock, FNP  mirtazapine (REMERON) 15 MG tablet Take 1 tablet (15 mg total) by mouth at bedtime. 11/27/19   Money, Gerlene Burdock, FNP  traZODone (DESYREL) 50 MG tablet Take 1 tablet (50 mg total) by mouth at bedtime as needed for sleep. 11/27/19   Money, Gerlene Burdock, FNP  venlafaxine XR (EFFEXOR-XR) 75 MG 24 hr capsule Take 1 capsule (75 mg total) by mouth daily with breakfast. 11/28/19   Money, Gerlene Burdock, FNP    Allergies    Patient has no known allergies.  Review of Systems   Review of Systems  Constitutional: Negative for fever.  HENT: Negative for sore throat.   Eyes: Negative for visual disturbance.  Respiratory: Negative for cough and shortness of breath.   Cardiovascular: Negative for chest pain.  Gastrointestinal: Negative for abdominal pain, nausea and vomiting.  Genitourinary: Negative for dysuria and hematuria.  Musculoskeletal: Negative for back pain and neck pain.  Skin: Negative for color change.  Neurological: Negative for dizziness, weakness, light-headedness, numbness and headaches.  All other systems reviewed and are negative.   Physical Exam  Updated Vital Signs BP (!) 138/91   Pulse (!) 102   Temp 98.5 F (36.9 C) (Oral)   Resp 16   Ht 5\' 11"  (1.803 m)   Wt 81.6 kg   SpO2 90%   BMI 25.10 kg/m   Physical Exam Vitals and nursing note reviewed.  Constitutional:      Appearance: He is well-developed.  HENT:     Head: Normocephalic and atraumatic.  Eyes:     Conjunctiva/sclera: Conjunctivae normal.  Cardiovascular:     Rate and Rhythm: Regular rhythm. Tachycardia present.     Heart sounds: Normal heart sounds. No murmur heard.   Pulmonary:     Effort: Pulmonary effort is normal. No respiratory distress.     Breath sounds: Normal breath sounds. No wheezing, rhonchi or  rales.  Abdominal:     General: Bowel sounds are normal.     Palpations: Abdomen is soft.     Tenderness: There is no abdominal tenderness.  Musculoskeletal:     Cervical back: Neck supple.  Skin:    General: Skin is warm and dry.     Comments: Fresh track marks to the left [facility]  Neurological:     Mental Status: He is alert.     Comments: Mental Status:  Alert, thought content appropriate, able to give a coherent history. Speech fluent without evidence of aphasia. Able to follow 2 step commands without difficulty.  Cranial Nerves:  II:  Peripheral visual fields grossly normal, pupils equal, round, reactive to light III,IV, VI: ptosis not present, extra-ocular motions intact bilaterally  V,VII: smile symmetric, facial light touch sensation equal VIII: hearing grossly normal to voice  X: uvula elevates symmetrically  XI: bilateral shoulder shrug symmetric and strong XII: midline tongue extension without fassiculations Motor:  Normal tone. 5/5 strength of BUE and BLE major muscle groups including strong and equal grip strength and dorsiflexion/plantar flexion Sensory: light touch normal in all extremities.      ED Results / Procedures / Treatments   Labs (all labs ordered are listed, but only abnormal results are displayed) Labs Reviewed  CBG MONITORING, ED    EKG None  Radiology No results found.  Procedures Procedures (including critical care time)  Medications Ordered in ED Medications - No data to display  ED Course  I have reviewed the triage vital signs and the nursing notes.  Pertinent labs & imaging results that were available during my care of the patient were reviewed by me and considered in my medical decision making (see chart for details).    MDM Rules/Calculators/A&P                          29 year old male presenting the emergency department today for evaluation after an overdose.  Per EMS, patient was found down at his place of employment. He  received 2 rounds of intranasal Narcan prior to arrival and became alert/oriented following this.  On my evaluation patient alert, oriented in no acute distress.  He admits to taking his regular psychiatric medications but then further states that he took an old Percocet.  He repeatedly asks to be discharged.  By iterated the importance of a prolonged monitoring.  Due to concern that Narcan can wear off and his symptoms may return and cause him to overdose and potentially die.  Patient voices understanding of this but states he has things to do today and is supposed to go on a date.  He initially agreed  to stay for an EKG, CBG and further monitoring however I was later informed by nursing staff that he eloped while I was in another patient's room.  Final Clinical Impression(s) / ED Diagnoses Final diagnoses:  Drug overdose, undetermined intent, initial encounter    Rx / DC Orders ED Discharge Orders    None       Karrie Meres, PA-C 04/10/20 1723

## 2020-04-10 NOTE — ED Notes (Signed)
Pt left without telling staff.  Left with IV in his hand.  Will notify off duty Emergency planning/management officer.

## 2020-04-10 NOTE — ED Triage Notes (Signed)
Pt here via [facility] from work.  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  He was revived with 2 doses of intranasal narcan.  Pt wants to leave, right now.

## 2020-04-10 NOTE — ED Notes (Signed)
Pt requested repeatedly that he wants to leave.  Notified of risks and requested that pt stay for 2 tests.

## 2020-11-30 ENCOUNTER — Encounter (HOSPITAL_COMMUNITY): Payer: Self-pay | Admitting: Adult Health

## 2020-11-30 ENCOUNTER — Inpatient Hospital Stay (HOSPITAL_COMMUNITY)
Admission: AD | Admit: 2020-11-30 | Discharge: 2020-12-04 | DRG: 885 | Disposition: A | Discharge status: Home / Self Care | Payer: Federal, State, Local not specified - Other | Source: Intra-hospital | Attending: Emergency Medicine | Admitting: Emergency Medicine

## 2020-11-30 ENCOUNTER — Encounter (HOSPITAL_COMMUNITY): Payer: Self-pay | Admitting: Emergency Medicine

## 2020-11-30 ENCOUNTER — Emergency Department (HOSPITAL_COMMUNITY)
Admission: EM | Admit: 2020-11-30 | Discharge: 2020-11-30 | Disposition: A | Discharge status: Other Acute Inpatient Hospital | Payer: Medicaid Other | Attending: Emergency Medicine | Admitting: Emergency Medicine

## 2020-11-30 ENCOUNTER — Other Ambulatory Visit: Payer: Self-pay

## 2020-11-30 DIAGNOSIS — Z79899 Other long term (current) drug therapy: Secondary | ICD-10-CM

## 2020-11-30 DIAGNOSIS — Z9151 Personal history of suicidal behavior: Secondary | ICD-10-CM | POA: Diagnosis not present

## 2020-11-30 DIAGNOSIS — F1110 Opioid abuse, uncomplicated: Secondary | ICD-10-CM | POA: Diagnosis present

## 2020-11-30 DIAGNOSIS — R45851 Suicidal ideations: Secondary | ICD-10-CM | POA: Diagnosis present

## 2020-11-30 DIAGNOSIS — F1994 Other psychoactive substance use, unspecified with psychoactive substance-induced mood disorder: Secondary | ICD-10-CM | POA: Diagnosis present

## 2020-11-30 DIAGNOSIS — Z20822 Contact with and (suspected) exposure to covid-19: Secondary | ICD-10-CM | POA: Insufficient documentation

## 2020-11-30 DIAGNOSIS — F17210 Nicotine dependence, cigarettes, uncomplicated: Secondary | ICD-10-CM | POA: Diagnosis present

## 2020-11-30 DIAGNOSIS — F332 Major depressive disorder, recurrent severe without psychotic features: Principal | ICD-10-CM | POA: Diagnosis present

## 2020-11-30 DIAGNOSIS — F17200 Nicotine dependence, unspecified, uncomplicated: Secondary | ICD-10-CM | POA: Insufficient documentation

## 2020-11-30 DIAGNOSIS — B1920 Unspecified viral hepatitis C without hepatic coma: Secondary | ICD-10-CM | POA: Diagnosis present

## 2020-11-30 DIAGNOSIS — F419 Anxiety disorder, unspecified: Secondary | ICD-10-CM | POA: Diagnosis present

## 2020-11-30 DIAGNOSIS — F1190 Opioid use, unspecified, uncomplicated: Secondary | ICD-10-CM | POA: Insufficient documentation

## 2020-11-30 DIAGNOSIS — G479 Sleep disorder, unspecified: Secondary | ICD-10-CM | POA: Diagnosis present

## 2020-11-30 LAB — RAPID URINE DRUG SCREEN, HOSP PERFORMED
Amphetamines: NOT DETECTED
Barbiturates: NOT DETECTED
Benzodiazepines: NOT DETECTED
Cocaine: NOT DETECTED
Opiates: POSITIVE — AB
Tetrahydrocannabinol: NOT DETECTED

## 2020-11-30 LAB — CBC
HCT: 37.7 % — ABNORMAL LOW (ref 39.0–52.0)
Hemoglobin: 12.5 g/dL — ABNORMAL LOW (ref 13.0–17.0)
MCH: 29.3 pg (ref 26.0–34.0)
MCHC: 33.2 g/dL (ref 30.0–36.0)
MCV: 88.5 fL (ref 80.0–100.0)
Platelets: 220 10*3/uL (ref 150–400)
RBC: 4.26 MIL/uL (ref 4.22–5.81)
RDW: 13.1 % (ref 11.5–15.5)
WBC: 8.6 10*3/uL (ref 4.0–10.5)
nRBC: 0 % (ref 0.0–0.2)

## 2020-11-30 LAB — RESP PANEL BY RT-PCR (FLU A&B, COVID) ARPGX2
Influenza A by PCR: NEGATIVE
Influenza B by PCR: NEGATIVE
SARS Coronavirus 2 by RT PCR: NEGATIVE

## 2020-11-30 LAB — COMPREHENSIVE METABOLIC PANEL
ALT: 58 U/L — ABNORMAL HIGH (ref 0–44)
AST: 34 U/L (ref 15–41)
Albumin: 3.9 g/dL (ref 3.5–5.0)
Alkaline Phosphatase: 64 U/L (ref 38–126)
Anion gap: 9 (ref 5–15)
BUN: 13 mg/dL (ref 6–20)
CO2: 24 mmol/L (ref 22–32)
Calcium: 9.4 mg/dL (ref 8.9–10.3)
Chloride: 100 mmol/L (ref 98–111)
Creatinine, Ser: 0.94 mg/dL (ref 0.61–1.24)
GFR, Estimated: >60 mL/min (ref >60)
Glucose, Bld: 106 mg/dL — ABNORMAL HIGH (ref 70–99)
Potassium: 3.6 mmol/L (ref 3.5–5.1)
Sodium: 133 mmol/L — ABNORMAL LOW (ref 135–145)
Total Bilirubin: 0.5 mg/dL (ref 0.3–1.2)
Total Protein: 6.4 g/dL — ABNORMAL LOW (ref 6.5–8.1)

## 2020-11-30 LAB — SALICYLATE LEVEL: Salicylate Lvl: <7 mg/dL — ABNORMAL LOW (ref 7.0–30.0)

## 2020-11-30 LAB — ACETAMINOPHEN LEVEL: Acetaminophen (Tylenol), Serum: <10 ug/mL — ABNORMAL LOW (ref 10–30)

## 2020-11-30 LAB — ETHANOL: Alcohol, Ethyl (B): <10 mg/dL (ref <10)

## 2020-11-30 MED ORDER — ALUM & MAG HYDROXIDE-SIMETH 200-200-20 MG/5ML PO SUSP: 30.0000 mL | ORAL | Status: DC | PRN

## 2020-11-30 MED ORDER — TRAZODONE HCL 50 MG PO TABS
50.0000 mg | ORAL_TABLET | Freq: Every evening | ORAL | Status: DC | PRN
Start: 2020-11-30 — End: 2020-11-30

## 2020-11-30 MED ORDER — ACETAMINOPHEN 325 MG PO TABS: 650.0000 mg | ORAL_TABLET | Freq: Four times a day (QID) | ORAL | Status: DC | PRN

## 2020-11-30 MED ORDER — VENLAFAXINE HCL 25 MG PO TABS
25.0000 mg | ORAL_TABLET | Freq: Two times a day (BID) | ORAL | Status: DC
Start: 2020-11-30 — End: 2020-11-30
  Administered 2020-11-30: 25 mg via ORAL
  Filled 2020-11-30 (×2): qty 1

## 2020-11-30 MED ORDER — GABAPENTIN 100 MG PO CAPS
200.0000 mg | ORAL_CAPSULE | Freq: Three times a day (TID) | ORAL | Status: DC
  Administered 2020-12-01 – 2020-12-04 (×10): 200 mg via ORAL
  Filled 2020-11-30 (×14): qty 2

## 2020-11-30 MED ORDER — GABAPENTIN 100 MG PO CAPS
200.0000 mg | ORAL_CAPSULE | Freq: Three times a day (TID) | ORAL | Status: DC
  Administered 2020-11-30: 200 mg via ORAL
  Filled 2020-11-30: qty 2

## 2020-11-30 MED ORDER — TRAZODONE HCL 50 MG PO TABS
50.0000 mg | ORAL_TABLET | Freq: Every evening | ORAL | Status: DC | PRN
  Administered 2020-11-30 – 2020-12-03 (×4): 50 mg via ORAL
  Filled 2020-11-30 (×3): qty 1
  Filled 2020-11-30: qty 7
  Filled 2020-11-30: qty 1

## 2020-11-30 MED ORDER — MAGNESIUM HYDROXIDE 400 MG/5ML PO SUSP
30.0000 mL | Freq: Every day | ORAL | Status: DC | PRN
  Administered 2020-12-03: 30 mL via ORAL
  Filled 2020-11-30: qty 30

## 2020-11-30 MED ORDER — VENLAFAXINE HCL 25 MG PO TABS
25.0000 mg | ORAL_TABLET | Freq: Two times a day (BID) | ORAL | Status: DC
  Administered 2020-12-01: 25 mg via ORAL
  Filled 2020-11-30 (×5): qty 1

## 2020-11-30 NOTE — ED Provider Notes (Signed)
[facility] EMERGENCY DEPARTMENT Provider Note   CSN: [id number] Arrival date & time: 11/30/20  0530     History Chief Complaint  Patient presents with  . Suicidal    Jared Jordan is a 30 y.o. male.  Patient presents the emergency department for evaluation of suicidal ideations.  Patient states that there are several stressors in his life that have been "building up" and now he feels very overwhelmed.  He states that he has access to a gun.  He states that a friend took the bullets from him.  He does not want to use the gun but is becoming more desperate.  He reports a previous suicide attempt when he tried to jump off a 3 story building.  He reports drug use in the past but has been staying sober.  He denies any recent medical complaints.  He states that he was urged by several friends and family to come get help today. The onset of this condition was acute. The course is constant. Aggravating factors: none. Alleviating factors: none.          Past Medical History:  Diagnosis Date  . Drug use     Patient Active Problem List   Diagnosis Date Noted  . Substance induced mood disorder (HCC) 11/22/2019    Past Surgical History:  Procedure Laterality Date  . SHOULDER SURGERY    . SHOULDER SURGERY Left 2020       No family history on file.  Social History   Tobacco Use  . Smoking status: Current Every Day Smoker    Packs/day: 0.25    Years: 1.00    Pack years: 0.25  . Smokeless tobacco: Never Used  Vaping Use  . Vaping Use: Some days  . Start date: 11/04/2018  . Devices: Jewel Menthol  Substance Use Topics  . Alcohol use: Yes    Alcohol/week: 4.0 - 6.0 standard drinks    Types: 4 - 6 Cans of beer per week    Comment: 2-3 times weekly  . Drug use: Not Currently    Types: IV, Methamphetamines    Comment: using 1gm everyday heroin    Home Medications Prior to Admission medications   Medication Sig Start Date End Date Taking? Authorizing  Provider  gabapentin (NEURONTIN) 300 MG capsule Take 1 capsule (300 mg total) by mouth 3 (three) times daily. 11/27/19   Money, Gerlene Burdock, FNP  mirtazapine (REMERON) 15 MG tablet Take 1 tablet (15 mg total) by mouth at bedtime. 11/27/19   Money, Gerlene Burdock, FNP  traZODone (DESYREL) 50 MG tablet Take 1 tablet (50 mg total) by mouth at bedtime as needed for sleep. 11/27/19   Money, Gerlene Burdock, FNP  venlafaxine XR (EFFEXOR-XR) 75 MG 24 hr capsule Take 1 capsule (75 mg total) by mouth daily with breakfast. 11/28/19   Money, Gerlene Burdock, FNP    Allergies    Patient has no known allergies.  Review of Systems   Review of Systems  Constitutional: Negative for fever.  HENT: Negative for rhinorrhea and sore throat.   Eyes: Negative for redness.  Respiratory: Negative for cough.   Cardiovascular: Negative for chest pain.  Gastrointestinal: Negative for abdominal pain, diarrhea, nausea and vomiting.  Genitourinary: Negative for dysuria and hematuria.  Musculoskeletal: Negative for myalgias.  Skin: Negative for rash.  Neurological: Negative for headaches.  Psychiatric/Behavioral: Positive for suicidal ideas. The patient is nervous/anxious.     Physical Exam Updated Vital Signs BP (!) 130/96 (BP Location: Left  Arm)   Pulse 74   Temp 98.6 F (37 C) (Oral)   Resp 18   Ht 5\' 11"  (1.803 m)   Wt 85 kg   SpO2 99%   BMI 26.14 kg/m   Physical Exam Vitals and nursing note reviewed.  Constitutional:      Appearance: He is well-developed.  HENT:     Head: Normocephalic and atraumatic.  Eyes:     General:        Right eye: No discharge.        Left eye: No discharge.     Conjunctiva/sclera: Conjunctivae normal.  Cardiovascular:     Rate and Rhythm: Normal rate and regular rhythm.     Heart sounds: Normal heart sounds.  Pulmonary:     Effort: Pulmonary effort is normal.     Breath sounds: Normal breath sounds.  Abdominal:     Palpations: Abdomen is soft.     Tenderness: There is no abdominal  tenderness.  Musculoskeletal:     Cervical back: Normal range of motion and neck supple.  Skin:    General: Skin is warm and dry.  Neurological:     Mental Status: He is alert.  Psychiatric:        Attention and Perception: Attention normal.        Mood and Affect: Mood is depressed. Affect is tearful.        Speech: Speech normal.        Behavior: Behavior normal.        Thought Content: Thought content includes suicidal ideation. Thought content does not include homicidal ideation. Thought content includes suicidal plan. Thought content does not include homicidal plan.     ED Results / Procedures / Treatments   Labs (all labs ordered are listed, but only abnormal results are displayed) Labs Reviewed  COMPREHENSIVE METABOLIC PANEL - Abnormal; Notable for the following components:      Result Value   Sodium 133 (*)    Glucose, Bld 106 (*)    Total Protein 6.4 (*)    ALT 58 (*)    All other components within normal limits  SALICYLATE LEVEL - Abnormal; Notable for the following components:   Salicylate Lvl <7.0 (*)    All other components within normal limits  ACETAMINOPHEN LEVEL - Abnormal; Notable for the following components:   Acetaminophen (Tylenol), Serum <10 (*)    All other components within normal limits  CBC - Abnormal; Notable for the following components:   Hemoglobin 12.5 (*)    HCT 37.7 (*)    All other components within normal limits  RAPID URINE DRUG SCREEN, HOSP PERFORMED - Abnormal; Notable for the following components:   Opiates POSITIVE (*)    All other components within normal limits  RESP PANEL BY RT-PCR (FLU A&B, COVID) ARPGX2  ETHANOL    EKG None  Radiology No results found.  Procedures Procedures   Medications Ordered in ED Medications - No data to display  ED Course  I have reviewed the triage vital signs and the nursing notes.  Pertinent labs & imaging results that were available during my care of the patient were reviewed by me and  considered in my medical decision making (see chart for details).  Patient seen and examined.  Medical clearances labs reviewed and he is medically cleared.  Will request TTS consult.  Currently patient is voluntary.  Vital signs reviewed and are as follows: BP (!) 130/96 (BP Location: Left Arm)   Pulse 74  Temp 98.6 F (37 C) (Oral)   Resp 18   Ht 5\' 11"  (1.803 m)   Wt 85 kg   SpO2 99%   BMI 26.14 kg/m   2:57 PM TTS evaluation completed.  He has been accepted to behavioral health.  COVID-negative.    MDM Rules/Calculators/A&P                          Admit to behavioral health.  Final Clinical Impression(s) / ED Diagnoses Final diagnoses:  Suicidal thoughts    Rx / DC Orders ED Discharge Orders    None       , PA-C 11/30/20 1458    [place], 12/02/20, MD 11/30/20 1537

## 2020-11-30 NOTE — ED Notes (Signed)
Attempted report to [facility]. Pt is accepted to go to [facility]. [facility] states report should be called at 1930. Will notify night shift tonight.
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
## 2020-11-30 NOTE — ED Notes (Signed)
Pt given a phone call  

## 2020-11-30 NOTE — ED Triage Notes (Signed)
Patient requesting psychiatric treatment for his depression and suicidal ideation - plans to shoot himself , denies hallucinations .

## 2020-11-30 NOTE — ED Notes (Signed)
Pt spoke to psych via TTS at this time.

## 2020-11-30 NOTE — ED Provider Notes (Signed)
I was informed by the nurse that the patient has been accepted to [facility] by [employer].  Emtala has been filled out.   Terrilee Files, MD 11/30/20 [phone]

## 2020-11-30 NOTE — Tx Team (Signed)
Initial Treatment Plan 11/30/2020 10:24 PM Jared Jordan [id number]    PATIENT STRESSORS: Financial difficulties Marital or family conflict Occupational concerns Substance abuse Traumatic event   PATIENT STRENGTHS: Average or above average intelligence Communication skills Supportive family/friends Work skills   PATIENT IDENTIFIED PROBLEMS: Depression  Suicidal Ideation  Unresolved grief (Father's death)  Substance abuse               DISCHARGE CRITERIA:  Improved stabilization in mood, thinking, and/or behavior Motivation to continue treatment in a less acute level of care Need for constant or close observation no longer present Verbal commitment to aftercare and medication compliance  PRELIMINARY DISCHARGE PLAN: Attend aftercare/continuing care group Outpatient therapy Return to previous living arrangement  PATIENT/FAMILY INVOLVEMENT: This treatment plan has been presented to and reviewed with the patient, Jared Jordan.  The patient and family have been given the opportunity to ask questions and make suggestions.  Juliann Pares, RN 11/30/2020, 10:24 PM

## 2020-11-30 NOTE — ED Notes (Signed)
[facility] called 

## 2020-11-30 NOTE — ED Notes (Signed)
Provider Marlinda Mike Mills,NP ordered EKG. EKG done and on file. Provider notified through secured chat at this time.

## 2020-11-30 NOTE — Progress Notes (Addendum)
Patient is accepted to [facility] room 300-2. He will need to be transported on next shift. Call nurse-to-nurse report to [phone] after 1930. Also, awaiting results for covid test. Thank you, Vesta Mixer., RN/AC

## 2020-12-01 DIAGNOSIS — R45851 Suicidal ideations: Secondary | ICD-10-CM | POA: Diagnosis not present

## 2020-12-01 LAB — COMPREHENSIVE METABOLIC PANEL
ALT: 93 U/L — ABNORMAL HIGH (ref 0–44)
AST: 59 U/L — ABNORMAL HIGH (ref 15–41)
Albumin: 4.5 g/dL (ref 3.5–5.0)
Alkaline Phosphatase: 77 U/L (ref 38–126)
Anion gap: 10 (ref 5–15)
BUN: 15 mg/dL (ref 6–20)
CO2: 25 mmol/L (ref 22–32)
Calcium: 9.7 mg/dL (ref 8.9–10.3)
Chloride: 102 mmol/L (ref 98–111)
Creatinine, Ser: 0.8 mg/dL (ref 0.61–1.24)
GFR, Estimated: >60 mL/min (ref >60)
Glucose, Bld: 132 mg/dL — ABNORMAL HIGH (ref 70–99)
Potassium: 4.8 mmol/L (ref 3.5–5.1)
Sodium: 137 mmol/L (ref 135–145)
Total Bilirubin: 0.6 mg/dL (ref 0.3–1.2)
Total Protein: 7.9 g/dL (ref 6.5–8.1)

## 2020-12-01 LAB — TSH: TSH: 0.276 u[IU]/mL — ABNORMAL LOW (ref 0.350–4.500)

## 2020-12-01 MED ORDER — HYDROXYZINE HCL 25 MG PO TABS
25.0000 mg | ORAL_TABLET | Freq: Four times a day (QID) | ORAL | Status: DC | PRN
  Administered 2020-12-02 – 2020-12-03 (×4): 25 mg via ORAL
  Filled 2020-12-01 (×4): qty 1
  Filled 2020-12-01: qty 10

## 2020-12-01 MED ORDER — ONDANSETRON 4 MG PO TBDP: 4.0000 mg | ORAL_TABLET | Freq: Four times a day (QID) | ORAL | Status: DC | PRN

## 2020-12-01 MED ORDER — LOPERAMIDE HCL 2 MG PO CAPS
2.0000 mg | ORAL_CAPSULE | ORAL | Status: DC | PRN
Start: 2020-12-01 — End: 2020-12-04

## 2020-12-01 MED ORDER — HYDROXYZINE HCL 25 MG PO TABS: 25.0000 mg | ORAL_TABLET | Freq: Three times a day (TID) | ORAL | Status: DC | PRN

## 2020-12-01 MED ORDER — MIRTAZAPINE 15 MG PO TABS
15.0000 mg | ORAL_TABLET | Freq: Every day | ORAL | Status: DC
  Administered 2020-12-01 – 2020-12-03 (×3): 15 mg via ORAL
  Filled 2020-12-01 (×4): qty 1

## 2020-12-01 MED ORDER — DICYCLOMINE HCL 20 MG PO TABS: 20.0000 mg | ORAL_TABLET | Freq: Four times a day (QID) | ORAL | Status: DC | PRN

## 2020-12-01 MED ORDER — NAPROXEN 500 MG PO TABS: 500.0000 mg | ORAL_TABLET | Freq: Two times a day (BID) | ORAL | Status: DC | PRN

## 2020-12-01 MED ORDER — METHOCARBAMOL 500 MG PO TABS
500.0000 mg | ORAL_TABLET | Freq: Three times a day (TID) | ORAL | Status: DC | PRN
Start: 2020-12-01 — End: 2020-12-04

## 2020-12-01 MED ORDER — NICOTINE 21 MG/24HR TD PT24
21.0000 mg | MEDICATED_PATCH | Freq: Every day | TRANSDERMAL | Status: DC
  Administered 2020-12-01 – 2020-12-04 (×4): 21 mg via TRANSDERMAL
  Filled 2020-12-01 (×6): qty 1

## 2020-12-01 NOTE — Plan of Care (Signed)
Mood d/o Questionnaire completed 12/01/20 is positive screen for possible bipolar spectrum illness.  Jared Crews, MD, Jared Jordan

## 2020-12-01 NOTE — Progress Notes (Signed)
The focus of this group is to help patients review their daily goal of treatment and discuss progress on daily workbooks. Pt attended the evening group session and responded to all discussion prompts from the Writer. Pt shared that today was a good day on the unit, the highlight of which was a good conversation with the Nurse Prac. "I felt like she really heard me and had good things to say back."  Pt told that his goal for the coming week was to attend all meetings, be present for his treatment, and to get better.  Pt rated his day a 7 out of 10 and his affect was appropriate.

## 2020-12-01 NOTE — Plan of Care (Signed)
Nurse discussed anxiety, depression and coping skills with patient.  

## 2020-12-01 NOTE — [facility] Suicide Risk Assessment (Signed)
[facility] Admission Suicide Risk Assessment   Nursing information obtained from:  Patient Demographic factors:  Male,Access to firearms,Unemployed,Living alone Current Mental Status:  Suicidal ideation indicated by patient,Suicidal ideation indicated by others,Self-harm thoughts,Intention to act on suicide plan,Belief that plan would result in death Loss Factors:  Decrease in vocational status,Loss of significant relationship,Financial problems / change in socioeconomic status Historical Factors:  Prior suicide attempts,Impulsivity Risk Reduction Factors:  Sense of responsibility to family,Positive social support  Total Time Spent in Direct Patient Care:  I personally spent 40 minutes on the unit in direct patient care. The direct patient care time included face-to-face time with the patient, reviewing the patient's chart, communicating with other professionals, and coordinating care. Greater than 50% of this time was spent in counseling or coordinating care with the patient regarding goals of hospitalization, psycho-education, and discharge planning needs.  Principal Problem: Major depressive disorder, recurrent severe without psychotic features (HCC) Diagnosis:  Principal Problem:   Major depressive disorder, recurrent severe without psychotic features (HCC) r/o opiate use d/o R/o bipolar II  Subjective Data: The patient is a 30y/o male admitted voluntarily after presenting to [facility] on 11/30/20 for suicidal ideation with thoughts of shooting himself.He states that for the last 2 weeks he has felt depressed with associated issues with insomnia, fair appetite, anhedonia, feelings of guilt about his life, poor focus, and low energy. He states he has had similar depressive episodes "off and on" since the death of his father in 11-06-18. 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 He was  admitted to inpatient psychiatry in May of 2021 at which time he was diagnosed with a substance induced mood d/o and was discharged on Neurontin 300mg  tid, Remeron 15mg  qhs, Effexor XR 75mg , and PRN Trazodone for sleep. He states he took the psychotropic medications until the prescription ran out and did not follow up or get medication refills. He reportedly had a prior suicide attempt of jumping out of a 3rd story window and previously cutting his wrist in the past. He denies previous h/o HI or psychosis. 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 He reports that he used some Darvocet given to him by a co-worker recently "for ankle pain" but denies recent abuse of opiates or current signs of withdrawal. He states he has not used alcohol in the last 2 months. When questioned about previous manic/hypomanic symptoms he is vague. He thinks he has had times in the past when clean and sober where he feels more energetic and productive for several days in a row, is more talkative and "witty," spends in excess, and feels more creative. He does nto think he has ever been diagnosed as bipolar in the past. He also thinks he may be Hep C positive but has never had treatment.  Continued Clinical Symptoms:  Alcohol Use Disorder Identification Test Final Score (AUDIT): 7 The "Alcohol Use Disorders Identification Test", Guidelines for Use in Primary Care, Second Edition.  [facility]). Score between 0-7:  no or low risk or alcohol related problems. Score between 8-15:  moderate risk of alcohol related problems. Score between 16-19:  high risk of alcohol related problems. Score 20 or above:  warrants further diagnostic evaluation for alcohol dependence and treatment.  CLINICAL FACTORS:   Depression:   Impulsivity, insomnia  Recent opiate use  Musculoskeletal: Strength & Muscle Tone: within normal  limits Gait & Station: normal, steady Patient leans: N/A  Psychiatric Specialty Exam: Physical Exam Vitals reviewed.  HENT:     Head: Normocephalic.  Pulmonary:     Effort: Pulmonary effort is normal.  Neurological:     General: No focal deficit present.     Mental Status: He is alert.     Review of Systems - see H&P  Blood pressure 112/84, pulse 98, temperature 98.4 F (36.9 C), temperature source Oral, resp. rate 16, height 5\' 11"  (1.803 m), weight 76.2 kg, SpO2 98 %.Body mass index is 23.43 kg/m.  General Appearance: casually dressed, well groomed  Eye Contact:  Good  Speech:  Clear and Coherent and Normal Rate  Volume:  Normal  Mood:  mildly anxious  Affect:  Congruent  Thought Process:  Goal Directed and Linear  Orientation:  Full (Time, Place, and Person)  Thought Content:  Logical and denies AVH, paranoia, or delusions  Suicidal Thoughts:  Endorsed SI with intent prior to admission; denies current SI  Homicidal Thoughts:  No  Memory:  Recent;   Good  Judgement:  Fair  Insight:  Fair  Psychomotor Activity:  Normal  Concentration:  Concentration: Good and Attention Span: Good  Recall:  Good  Fund of Knowledge:  Good  Language:  Good  Akathisia:  Negative  Assets:  Communication Skills Desire for Improvement Resilience  ADL's:  Intact  Cognition:  WNL  Sleep:  Number of Hours: 6.75   COGNITIVE FEATURES THAT CONTRIBUTE TO RISK:  None    SUICIDE RISK:   Moderate:  Frequent suicidal ideation with limited intensity, and duration, some specificity in terms of plans, no associated intent, good self-control, limited dysphoria/symptomatology, some risk factors present, and identifiable protective factors, including available and accessible social support.  PLAN OF CARE: Patient admitted voluntarily to inpatient psychiatry. Admission labs reviewed: CMP WNL except for Na+ 133, glucose 106, total protein 6.4 and ALT 58; ETOH <10; Salicylate <7; Tylenol <10; WBC 8.6, H/H  12.5/37.7 and platelets 220; UDS positive for opiates; respiratory panel negative; EKG shows NSR 62bpm with QTc 04[birth year]1988. Will recheck CMP for trending of Na+ and ALT and will check TSH and A1c as well as acute hepatitis panel. He declines HIV testing. We will give patient a mood disorder questionnaire for screening for potential bipolar spectrum illness and will place him on COWS monitoring in case he is under-reporting recent opiate use. He has been started on Neurontin 200mg  tid which will help with anxiety and mood stabilization as well as potential withdrawal. He has been started on Remeron 15mg  po qhs for mood and sleep and has PRNs for Trazodone for insomnia and PRN Vistaril for anxiety.   I certify that inpatient services furnished can reasonably be expected to improve the patient's condition.   , MD, FAPA 12/01/2020, 3:51 PM

## 2020-12-01 NOTE — H&P (Signed)
Psychiatric Admission Assessment Adult  Patient Identification: Jared Jordan MRN:  [id number] Date of Evaluation:  12/01/2020 Chief Complaint:  MDD (major depressive disorder), recurrent episode, severe (HCC) [F33.2] Principal Diagnosis: <principal problem not specified> Diagnosis:  Active Problems:   MDD (major depressive disorder), recurrent episode, severe (HCC)  History of Present Illness: Jared Jordan is a 30 year old male with a past psychiatric history significant for MDD, recurrent severe and polysubstance abuse. He has had multiple inpatient psychiatric admissions and emergency room visits for drug overdose and suicidal ideation. He presented to [facility] with suicidal ideation and a plan to shoot himself with his gun. He stated his friend took the bullets from him. He stated his gun is in his bag at his friends house. He stated he has not been managing his life well since his father died 2 years ago. He stated he and his father ere close and this was a good relationship for him, he feels like he has not been okay since then. He reported he is on "thin ice" with his family, he has drifted away from friends, he quit his job 2 weeks ago and is losing his apartment. He stated at one time he had a scholarship to [employer] and was studying business. He had to drop out of school and lost his scholarship. He has had previous suicide attempts by cutting and jumping from a third story window. He stated he is losing his apartment and has been staying with friend but will not be able to stay there when he leaves the hospital. He stated "my family loves me and cares about me but they don't have the means to support me."  Patient describes depressive symptoms as anhedonia, racing thoughts, difficulty falling and staying asleep, feelings of worthlessness, hopelessness and guilt. He stated he is anxious and worries about everything. He listed protective factors as really wanting to live, supportive family,  and a life goal to complete college. He stated he gets $100 per week from his step-mother, this is money his father left for him. He denies a history of physical, sexual or emotional abuse or trauma.   He denies drug and alcohol use. His UDS is positive for opiates, alcohol level is <10. He stated he took Darvocet to get some sleep. At first he stated he got hurt on the job 2 weeks ago and had a prescription for the pain medicine. I explained there is no recent or current prescription for this medication on PDMP he stated he got it from his job, then he said he got it from someone at work. He denies he is actively using opiates again. He reported to the MD that he thinks he has Hep C due to his history of IV drug use.   Patient is alert & oriented. He denies homicidal ideation, paranoia and delusions. He endorses ongoing suicidal ideation. He appears anxious, sad and depressed. He denies auditory and visual hallucinations. He is abe to contract for safety on the unit and agrees to tell staff if he is having thoughts of harming himself. He is agreeable to start medications.   Associated Signs/Symptoms: Depression Symptoms:  depressed mood, anhedonia, fatigue, feelings of worthlessness/guilt, difficulty concentrating, hopelessness, suicidal thoughts with specific plan, anxiety, loss of energy/fatigue, disturbed sleep, Duration of Depression Symptoms: Greater than two weeks  (Hypo) Manic Symptoms:  Patient denies manic questions, no manic symtoms observed Anxiety Symptoms:  Excessive Worry, Psychotic Symptoms:  Hallucinations: None. Patient does not appear to be responding to internal  stimuli PTSD Symptoms: Negative patient denies history of sexual, physical and emotional trauma or abuse Total Time spent with patient: 1 hour  Past Psychiatric History: Multiple hospitalizations and ED visits for polysubstance abuse as well as MDD and suicide attempt.  In the last 6 months it appears the  patient has had multiple hospitalizations for mental health.  He reports 2 previous suicide attempts.   Is the patient at risk to self? Yes.    Has the patient been a risk to self in the past 6 months? No.  Has the patient been a risk to self within the distant past? Yes.    Is the patient a risk to others? No.  Has the patient been a risk to others in the past 6 months? No.  Has the patient been a risk to others within the distant past? No.   Prior Inpatient Therapy:  Yes Prior Outpatient Therapy:  Yes  Alcohol Screening: 1. How often do you have a drink containing alcohol?: Monthly or less 2. How many drinks containing alcohol do you have on a typical day when you are drinking?: 5 or 6 3. How often do you have six or more drinks on one occasion?: Monthly AUDIT-C Score: 5 4. How often during the last year have you found that you were not able to stop drinking once you had started?: Never 5. How often during the last year have you failed to do what was normally expected from you because of drinking?: Never 6. How often during the last year have you needed a first drink in the morning to get yourself going after a heavy drinking session?: Never 7. How often during the last year have you had a feeling of guilt of remorse after drinking?: Less than monthly 8. How often during the last year have you been unable to remember what happened the night before because you had been drinking?: Less than monthly 9. Have you or someone else been injured as a result of your drinking?: No 10. Has a relative or friend or a doctor or another health worker been concerned about your drinking or suggested you cut down?: No Alcohol Use Disorder Identification Test Final Score (AUDIT): 7 Alcohol Brief Interventions/Follow-up: Patient Refused Substance Abuse History in the last 12 months:  Yes.   Consequences of Substance Abuse: Medical Consequences:  frequent hospitalizations for drug overdose Family  Consequences:  Strained family relationships Previous Psychotropic Medications: Yes  Psychological Evaluations: No  Past Medical History:  Past Medical History:  Diagnosis Date  . Drug use     Past Surgical History:  Procedure Laterality Date  . SHOULDER SURGERY    . SHOULDER SURGERY Left 2020   Family History: History reviewed. No pertinent family history. Family Psychiatric  History: Patient declines to answer Tobacco Screening: Have you used any form of tobacco in the last 30 days? (Cigarettes, Smokeless Tobacco, Cigars, and/or Pipes): Yes Tobacco use, Select all that apply: 5 or more cigarettes per day Are you interested in Tobacco Cessation Medications?: Yes, will notify MD for an order Counseled patient on smoking cessation including recognizing danger situations, developing coping skills and basic information about quitting provided: Refused/Declined practical counseling Social History:  Social History   Substance and Sexual Activity  Alcohol Use Yes  . Alcohol/week: 4.0 - 6.0 standard drinks  . Types: 4 - 6 Cans of beer per week   Comment: 2-3 times weekly     Social History   Substance and Sexual Activity  Drug  Use Not Currently  . Types: IV, Methamphetamines   Comment: using 1gm everyday heroin    Additional Social History:  Allergies:  No Known Allergies Lab Results:  Results for orders placed or performed during the hospital encounter of 11/30/20 (from the past 48 hour(s))  Comprehensive metabolic panel     Status: Abnormal   Collection Time: 11/30/20  5:45 AM  Result Value Ref Range   Sodium 133 (L) 135 - 145 mmol/L   Potassium 3.6 3.5 - 5.1 mmol/L   Chloride 100 98 - 111 mmol/L   CO2 24 22 - 32 mmol/L   Glucose, Bld 106 (H) 70 - 99 mg/dL    Comment: Glucose reference range applies only to samples taken after fasting for at least 8 hours.   BUN 13 6 - 20 mg/dL   Creatinine, Ser 7.65 0.61 - 1.24 mg/dL   Calcium 9.4 8.9 - 46.5 mg/dL   Total Protein 6.4  (L) 6.5 - 8.1 g/dL   Albumin 3.9 3.5 - 5.0 g/dL   AST 34 15 - 41 U/L   ALT 58 (H) 0 - 44 U/L   Alkaline Phosphatase 64 38 - 126 U/L   Total Bilirubin 0.5 0.3 - 1.2 mg/dL   GFR, Estimated >03 >54 mL/min    Comment: (NOTE) Calculated using the CKD-EPI Creatinine Equation (2021)    Anion gap 9 5 - 15    Comment: Performed at [facility], [address], [place], [place] [address]  Ethanol     Status: None   Collection Time: 11/30/20  5:45 AM  Result Value Ref Range   Alcohol, Ethyl (B) <10 <10 mg/dL    Comment: (NOTE) Lowest detectable limit for serum alcohol is 10 mg/dL.  For medical purposes only. Performed at [facility], [address], [place], [place] [address]   Salicylate level     Status: Abnormal   Collection Time: 11/30/20  5:45 AM  Result Value Ref Range   Salicylate Lvl <7.0 (L) 7.0 - 30.0 mg/dL    Comment: Performed at [facility], [address] [place], [place] [address]  Acetaminophen level     Status: Abnormal   Collection Time: 11/30/20  5:45 AM  Result Value Ref Range   Acetaminophen (Tylenol), Serum <10 (L) 10 - 30 ug/mL    Comment: (NOTE) Therapeutic concentrations vary significantly. A range of 10-30 ug/mL  may be an effective concentration for many patients. However, some  are best treated at concentrations outside of this range. Acetaminophen concentrations >150 ug/mL at 4 hours after ingestion  and >50 ug/mL at 12 hours after ingestion are often associated with  toxic reactions.  Performed at [facility], [address] [place], [place] [address]   cbc     Status: Abnormal   Collection Time: 11/30/20  5:45 AM  Result Value Ref Range   WBC 8.6 4.0 - 10.5 K/uL   RBC 4.26 4.22 - 5.81 MIL/uL   Hemoglobin 12.5 (L) 13.0 - 17.0 g/dL   HCT 75.9 (L) 16.3 - 84.6 %   MCV 88.5 80.0 - 100.0 fL   MCH 29.3 26.0 - 34.0 pg   MCHC 33.2 30.0 - 36.0 g/dL   RDW 65.9 93.5 - 70.1 %   Platelets 220 150 - 400 K/uL   nRBC 0.0 0.0 - 0.2  %    Comment: Performed at [facility], [address], [place], [place] [address]  Rapid urine drug  screen (hospital performed)     Status: Abnormal   Collection Time: 11/30/20  5:45 AM  Result Value Ref Range   Opiates POSITIVE (A) NONE DETECTED   Cocaine NONE DETECTED NONE DETECTED   Benzodiazepines NONE DETECTED NONE DETECTED   Amphetamines NONE DETECTED NONE DETECTED   Tetrahydrocannabinol NONE DETECTED NONE DETECTED   Barbiturates NONE DETECTED NONE DETECTED    Comment: (NOTE) DRUG SCREEN FOR MEDICAL PURPOSES ONLY.  IF CONFIRMATION IS NEEDED FOR ANY PURPOSE, NOTIFY LAB WITHIN 5 DAYS.  LOWEST DETECTABLE LIMITS FOR URINE DRUG SCREEN Drug Class                     Cutoff (ng/mL) Amphetamine and metabolites    1000 Barbiturate and metabolites    200 Benzodiazepine                 200 Tricyclics and metabolites     300 Opiates and metabolites        300 Cocaine and metabolites        300 THC                            50 Performed at [facility], [address] [place], [place] [address]   Resp Panel by RT-PCR (Flu A&B, Covid) Nasopharyngeal Swab     Status: None   Collection Time: 11/30/20 12:42 PM   Specimen: Nasopharyngeal Swab; Nasopharyngeal(NP) swabs in vial transport medium  Result Value Ref Range   SARS Coronavirus 2 by RT PCR NEGATIVE NEGATIVE    Comment: (NOTE) SARS-CoV-2 target nucleic acids are NOT DETECTED.  The SARS-CoV-2 RNA is generally detectable in upper respiratory specimens during the acute phase of infection. The lowest concentration of SARS-CoV-2 viral copies this assay can detect is 138 copies/mL. A negative result does not preclude SARS-Cov-2 infection and should not be used as the sole basis for treatment or other patient management decisions. A negative result may occur with  improper specimen collection/handling, submission of specimen other than nasopharyngeal swab, presence of viral mutation(s) within the areas targeted by  this assay, and inadequate number of viral copies(<138 copies/mL). A negative result must be combined with clinical observations, patient history, and epidemiological information. The expected result is Negative.  Fact Sheet for Patients:  BloggerCourse.comhttps://www.fda.gov/media/152166/download  Fact Sheet for Healthcare Providers:  SeriousBroker.ithttps://www.fda.gov/media/152162/download  This test is no t yet approved or cleared by the [place] FDA and  has been authorized for detection and/or diagnosis of SARS-CoV-2 by FDA under an Emergency Use Authorization (EUA). This EUA will remain  in effect (meaning this test can be used) for the duration of the COVID-19 declaration under Section 564(b)(1) of the Act, 21 U.S.C.section 360bbb-3(b)(1), unless the authorization is terminated  or revoked sooner.       Influenza A by PCR NEGATIVE NEGATIVE   Influenza B by PCR NEGATIVE NEGATIVE    Comment: (NOTE) The Xpert Xpress SARS-CoV-2/FLU/RSV plus assay is intended as an aid in the diagnosis of influenza from Nasopharyngeal swab specimens and should not be used as a sole basis for treatment. Nasal washings and aspirates are unacceptable for Xpert Xpress SARS-CoV-2/FLU/RSV testing.  Fact Sheet for Patients: BloggerCourse.comhttps://www.fda.gov/media/152166/download  Fact Sheet for Healthcare Providers: SeriousBroker.ithttps://www.fda.gov/media/152162/download  This test is not yet approved or cleared by the [place] FDA and has been authorized for detection and/or diagnosis of SARS-CoV-2 by FDA under an Emergency Use Authorization (EUA). This EUA will remain in  effect (meaning this test can be used) for the duration of the COVID-19 declaration under Section 564(b)(1) of the Act, 21 U.S.C. section 360bbb-3(b)(1), unless the authorization is terminated or revoked.  Performed at [facility], [address] [place], [place] [address]     Blood Alcohol level:  Lab Results  Component Value Date   ETH <10 11/30/2020   ETH  <10 11/21/2019    Metabolic Disorder Labs:  Lab Results  Component Value Date   HGBA1C 5.4 11/23/2019   MPG 108.28 11/23/2019   No results found for: PROLACTIN Lab Results  Component Value Date   CHOL 185 11/23/2019   TRIG 91 11/23/2019   HDL 51 11/23/2019   CHOLHDL 3.6 11/23/2019   VLDL 18 11/23/2019   LDLCALC 116 (H) 11/23/2019    Current Medications: Current Facility-Administered Medications  Medication Dose Route Frequency Provider Last Rate Last Admin  . acetaminophen (TYLENOL) tablet 650 mg  650 mg Oral Q6H PRN Chales Abrahams, NP      . alum & mag hydroxide-simeth (MAALOX/MYLANTA) 200-200-20 MG/5ML suspension 30 mL  30 mL Oral Q4H PRN Ophelia Shoulder E, NP      . gabapentin (NEURONTIN) capsule 200 mg  200 mg Oral TID Ophelia Shoulder E, NP   200 mg at 12/01/20 0858  . magnesium hydroxide (MILK OF MAGNESIA) suspension 30 mL  30 mL Oral Daily PRN Ophelia Shoulder E, NP      . traZODone (DESYREL) tablet 50 mg  50 mg Oral QHS PRN Chales Abrahams, NP   50 mg at 11/30/20 2156  . venlafaxine (EFFEXOR) tablet 25 mg  25 mg Oral BID WC Ophelia Shoulder E, NP   25 mg at 12/01/20 2956   PTA Medications: Medications Prior to Admission  Medication Sig Dispense Refill Last Dose  . gabapentin (NEURONTIN) 300 MG capsule Take 1 capsule (300 mg total) by mouth 3 (three) times daily. 90 capsule 0   . mirtazapine (REMERON) 15 MG tablet Take 1 tablet (15 mg total) by mouth at bedtime. 30 tablet 0   . traZODone (DESYREL) 50 MG tablet Take 1 tablet (50 mg total) by mouth at bedtime as needed for sleep. 30 tablet 0   . venlafaxine XR (EFFEXOR-XR) 75 MG 24 hr capsule Take 1 capsule (75 mg total) by mouth daily with breakfast. 30 capsule 0     Musculoskeletal: Strength & Muscle Tone: within normal limits Gait & Station: normal Patient leans: N/A  Psychiatric Specialty Exam:  Presentation  General Appearance: Appropriate for Environment; Casual; Fairly Groomed  Eye Contact:Fair  Speech:Clear and  Coherent; Normal Rate  Speech Volume:Normal  Handedness:Right  Mood and Affect  Mood:Depressed; Hopeless; Anxious  Affect:Congruent; Depressed  Thought Process  Thought Processes:Coherent; Linear  Duration of Psychotic Symptoms: No data recorded Past Diagnosis of Schizophrenia or Psychoactive disorder: No  Descriptions of Associations:Intact  Orientation:Full (Time, Place and Person)  Thought Content:Logical  Hallucinations:Hallucinations: None  Ideas of Reference:None  Suicidal Thoughts:Suicidal Thoughts: Yes, Active SI Active Intent and/or Plan: With Plan; With Means to Carry Out  Homicidal Thoughts:Homicidal Thoughts: No  Sensorium  Memory:Immediate Fair; Recent Fair; Remote Fair  Judgment:Fair  Insight:Fair  Executive Functions  Concentration:Fair  Attention Span:Fair  Recall:Fair  Fund of Knowledge:Good  Language:Good  Psychomotor Activity  Psychomotor Activity:Psychomotor Activity: Normal  Assets  Assets:Communication Skills; Desire for Improvement; Resilience; Social Support; Leisure Time  Sleep  Sleep:Sleep: Fair Number of Hours of Sleep: 6.5  Physical Exam: Physical Exam Vitals and nursing  note reviewed.  Constitutional:      Appearance: Normal appearance.  HENT:     Head: Normocephalic.  Pulmonary:     Effort: Pulmonary effort is normal.  Musculoskeletal:        General: Normal range of motion.     Cervical back: Normal range of motion.  Neurological:     General: No focal deficit present.     Mental Status: He is alert and oriented to person, place, and time.  Psychiatric:        Attention and Perception: Attention and perception normal. He does not perceive auditory or visual hallucinations.        Mood and Affect: Mood is anxious and depressed.        Speech: Speech normal.        Behavior: Behavior normal. Behavior is cooperative.        Thought Content: Thought content is not paranoid or delusional. Thought content  includes suicidal ideation. Thought content does not include homicidal ideation. Thought content includes suicidal plan. Thought content does not include homicidal plan.        Cognition and Memory: Cognition normal.    Review of Systems  Constitutional: Negative for fever.  HENT: Negative for congestion and sore throat.   Respiratory: Negative for cough and shortness of breath.   Cardiovascular: Negative for chest pain.  Gastrointestinal: Negative.   Genitourinary: Negative.   Musculoskeletal: Negative.   Neurological: Negative.    Blood pressure 112/84, pulse 98, temperature 98.4 F (36.9 C), temperature source Oral, resp. rate 16, height  (1.803 m), weight 76.2 kg, SpO2 98 %. Body mass index is 23.43 kg/m.  Treatment Plan Summary: 1. Admit to 300 hall for crisis management and stabilization, estimated length of stay 3-5 days.    2. Medication management to reduce current symptoms to base line and improve the patient's overall level of functioning: See MAR, Md's SRA & treatment plan.   Observation Level/Precautions:  15 minute checks  Laboratory:  CMP- Sodium 133, ALT 58, Protein 6.4, CBC with Diff - H/H 12.5/37.7, Lipid panel LDL 116, Alcohol <10, Salicylates <7. UDS p[ositive opiates. EKG QTc 410, NSR  Labs ordered: Repeat CMP,  A1c, TSH. Acute Hep C panel  Psychotherapy:  Group therapy  Medications:  See MAR  Consultations:  TBD  Discharge Concerns:  Safety, medication compliance  Estimated LOS: 3-5 days  Other:     Physician Treatment Plan for Primary Diagnosis: Major depressive disorder, recurrent severe without psychotic features (HCC) Long Term Goal(s): Improvement in symptoms so as ready for discharge  Short Term Goals: Ability to identify changes in lifestyle to reduce recurrence of condition will improve, Ability to verbalize feelings will improve, Ability to disclose and discuss suicidal ideas, Ability to identify and develop effective coping behaviors will  improve, Compliance with prescribed medications will improve and Ability to identify triggers associated with substance abuse/mental health issues will improve  Physician Treatment Plan for Secondary Diagnosis: Principal Problem:   Major depressive disorder, recurrent severe without psychotic features (HCC) Active Problems:   Substance induced mood disorder (HCC)  Long Term Goal(s): Improvement in symptoms so as ready for discharge  Short Term Goals: Ability to identify changes in lifestyle to reduce recurrence of condition will improve, Ability to verbalize feelings will improve, Ability to disclose and discuss suicidal ideas, Ability to identify and develop effective coping behaviors will improve, Compliance with prescribed medications will improve and Ability to identify triggers associated with substance abuse/mental health issues will  improve  I certify that inpatient services furnished can reasonably be expected to improve the patient's condition.    Laveda Abbe, NP 5/29/20224:40 PM

## 2020-12-01 NOTE — Progress Notes (Signed)
D:  Patient denied HI.  Denied A/V hallucinations.  SI, no plan, contracts for safety. A:  Medications administered per MD orders.  Emotional support and encouragement given patient. R:  Safety maintained with 15 minute checks.

## 2020-12-01 NOTE — [facility] Group Notes (Signed)
LCSW Group Therapy Note  12/01/2020       Type of Therapy and Topic:  Group Therapy: "My Mental Health"  Participation Level:  Did Not Attend   Description of Group:   In this group, patients were asked four questions in order to generate discussion around the idea of mental illness and/or substance addiction being medical problems: In one sentence describe the current state of your mental health or substance use. How much do you feel similar to or different from others? Do you tend to identify with other people or compare yourself to them?  In a word or sentence, share what you desire your mental health or substance use to be moving forward.  Discussion was held that led to the conclusion that comparing ourselves to others is not healthy, but identifying with the elements of their issues that are similar to ours is helpful.    Therapeutic Goals: Patients will identify their feelings about their current mental health/substance use problems. Patients will describe how they feel similar to or different from others, and whether they tend to identify with or compare themselves to other people with the same issues. Patients will explore the differences in these concepts and how a change of mindset about mental health/substance use can help with reaching recovery goals. Patients will think about and share what their recovery goals are, in terms of mental health and/or substance use.  Summary of Patient Progress:  The patient did not attend.   Therapeutic Modalities:   Processing Motivational Interviewing Psychoeducation  Lynnell Chad, MSW, LCSW

## 2020-12-01 NOTE — [facility] Group Notes (Signed)
Did not attend group 

## 2020-12-01 NOTE — Progress Notes (Signed)
   12/01/20 2237  Psych Admission Type (Psych Patients Only)  Admission Status Voluntary  Psychosocial Assessment  Patient Complaints Anxiety  Eye Contact Fair  Facial Expression Animated  Affect Appropriate to circumstance  Speech Logical/coherent  Interaction Assertive  Motor Activity Other (Comment) (WDL)  Appearance/Hygiene Improved  Behavior Characteristics Appropriate to situation  Mood Pleasant  Thought Process  Coherency WDL  Content WDL  Delusions None reported or observed  Perception WDL  Hallucination None reported or observed  Judgment Impaired  Confusion None  Danger to Self  Current suicidal ideation? Denies  Self-Injurious Behavior No self-injurious ideation or behavior indicators observed or expressed   Agreement Not to Harm Self Yes  Description of Agreement verbal  Danger to Others  Danger to Others None reported or observed

## 2020-12-01 NOTE — Progress Notes (Signed)
   12/01/20 2234  COVID-19 Daily Checkoff  Have you had a fever (temp > 37.80C/100F)  in the past 24 hours?  No  If you have had runny nose, nasal congestion, sneezing in the past 24 hours, has it worsened? No  COVID-19 EXPOSURE  Have you traveled outside the state in the past 14 days? No  Have you been in contact with someone with a confirmed diagnosis of COVID-19 or PUI in the past 14 days without wearing appropriate PPE? No  Have you been living in the same home as a person with confirmed diagnosis of COVID-19 or a PUI (household contact)? No  Have you been diagnosed with COVID-19? No

## 2020-12-02 DIAGNOSIS — F332 Major depressive disorder, recurrent severe without psychotic features: Secondary | ICD-10-CM | POA: Diagnosis not present

## 2020-12-02 DIAGNOSIS — F1994 Other psychoactive substance use, unspecified with psychoactive substance-induced mood disorder: Secondary | ICD-10-CM

## 2020-12-02 MED ORDER — OLANZAPINE 5 MG PO TBDP
5.0000 mg | ORAL_TABLET | Freq: Every day | ORAL | Status: DC
  Administered 2020-12-02 – 2020-12-03 (×2): 5 mg via ORAL
  Filled 2020-12-02 (×4): qty 1

## 2020-12-02 NOTE — Tx Team (Signed)
Interdisciplinary Treatment and Diagnostic Plan Update  12/02/2020 Time of Session: 9:25am Jared Jordan MRN: [id number]  Principal Diagnosis: Major depressive disorder, recurrent severe without psychotic features ([place])  Secondary Diagnoses: Principal Problem:   Major depressive disorder, recurrent severe without psychotic features ([place]) Active Problems:   Substance induced mood disorder ([employer] [employer])   Current Medications:  Current Facility-Administered Medications  Medication Dose Route Frequency Provider Last Rate Last Admin  . alum & mag hydroxide-simeth (MAALOX/MYLANTA) 200-200-20 MG/5ML suspension 30 mL  30 mL Oral Q4H PRN Merlyn Lot E, NP      . dicyclomine (BENTYL) tablet 20 mg  20 mg Oral Q6H PRN Nelda Marseille, Amy E, MD      . gabapentin (NEURONTIN) capsule 200 mg  200 mg Oral TID Merlyn Lot E, NP   200 mg at 12/02/20 0753  . hydrOXYzine (ATARAX/VISTARIL) tablet 25 mg  25 mg Oral Q6H PRN Harlow Asa, MD   25 mg at 12/02/20 0951  . loperamide (IMODIUM) capsule 2-4 mg  2-4 mg Oral PRN Nelda Marseille, Amy E, MD      . magnesium hydroxide (MILK OF MAGNESIA) suspension 30 mL  30 mL Oral Daily PRN Merlyn Lot E, NP      . methocarbamol (ROBAXIN) tablet 500 mg  500 mg Oral Q8H PRN Nelda Marseille, Amy E, MD      . mirtazapine (REMERON) tablet 15 mg  15 mg Oral QHS Ethelene Hal, NP   15 mg at 12/01/20 2135  . naproxen (NAPROSYN) tablet 500 mg  500 mg Oral BID PRN Harlow Asa, MD      . nicotine (NICODERM CQ - dosed in mg/24 hours) patch 21 mg  21 mg Transdermal Daily Harlow Asa, MD   21 mg at 12/02/20 0754  . ondansetron (ZOFRAN-ODT) disintegrating tablet 4 mg  4 mg Oral Q6H PRN Nelda Marseille, Amy E, MD      . traZODone (DESYREL) tablet 50 mg  50 mg Oral QHS PRN Mallie Darting, NP   50 mg at 12/01/20 2135   PTA Medications: Medications Prior to Admission  Medication Sig Dispense Refill Last Dose  . ibuprofen (ADVIL) 400 MG tablet Take 400 mg by mouth every 6 (six) hours  as needed for mild pain or headache.     . gabapentin (NEURONTIN) 300 MG capsule Take 1 capsule (300 mg total) by mouth 3 (three) times daily. (Patient not taking: Reported on 12/01/2020) 90 capsule 0 Not Taking at Unknown time  . mirtazapine (REMERON) 15 MG tablet Take 1 tablet (15 mg total) by mouth at bedtime. (Patient not taking: Reported on 12/01/2020) 30 tablet 0 Not Taking at Unknown time  . traZODone (DESYREL) 50 MG tablet Take 1 tablet (50 mg total) by mouth at bedtime as needed for sleep. (Patient not taking: Reported on 12/01/2020) 30 tablet 0 Not Taking at Unknown time  . venlafaxine XR (EFFEXOR-XR) 75 MG 24 hr capsule Take 1 capsule (75 mg total) by mouth daily with breakfast. (Patient not taking: Reported on 12/01/2020) 30 capsule 0 Not Taking at Unknown time    Patient Stressors: Financial difficulties Marital or family conflict Occupational concerns Substance abuse Traumatic event  Patient Strengths: Average or above average intelligence Communication skills Supportive family/friends Work skills  Treatment Modalities: Medication Management, Group therapy, Case management,  1 to 1 session with clinician, Psychoeducation, Recreational therapy.   Physician Treatment Plan for Primary Diagnosis: Major depressive disorder, recurrent severe without psychotic features ([place]) Long Term Goal(s): Improvement in symptoms so as  ready for discharge Improvement in symptoms so as ready for discharge   Short Term Goals: Ability to identify changes in lifestyle to reduce recurrence of condition will improve Ability to verbalize feelings will improve Ability to disclose and discuss suicidal ideas Ability to identify and develop effective coping behaviors will improve Compliance with prescribed medications will improve Ability to identify triggers associated with substance abuse/mental health issues will improve Ability to identify changes in lifestyle to reduce recurrence of condition will  improve Ability to verbalize feelings will improve Ability to disclose and discuss suicidal ideas Ability to identify and develop effective coping behaviors will improve Compliance with prescribed medications will improve Ability to identify triggers associated with substance abuse/mental health issues will improve  Medication Management: Evaluate patient's response, side effects, and tolerance of medication regimen.  Therapeutic Interventions: 1 to 1 sessions, Unit Group sessions and Medication administration.  Evaluation of Outcomes: Not Met  Physician Treatment Plan for Secondary Diagnosis: Principal Problem:   Major depressive disorder, recurrent severe without psychotic features ([place]) Active Problems:   Substance induced mood disorder ([place])  Long Term Goal(s): Improvement in symptoms so as ready for discharge Improvement in symptoms so as ready for discharge   Short Term Goals: Ability to identify changes in lifestyle to reduce recurrence of condition will improve Ability to verbalize feelings will improve Ability to disclose and discuss suicidal ideas Ability to identify and develop effective coping behaviors will improve Compliance with prescribed medications will improve Ability to identify triggers associated with substance abuse/mental health issues will improve Ability to identify changes in lifestyle to reduce recurrence of condition will improve Ability to verbalize feelings will improve Ability to disclose and discuss suicidal ideas Ability to identify and develop effective coping behaviors will improve Compliance with prescribed medications will improve Ability to identify triggers associated with substance abuse/mental health issues will improve     Medication Management: Evaluate patient's response, side effects, and tolerance of medication regimen.  Therapeutic Interventions: 1 to 1 sessions, Unit Group sessions and Medication administration.  Evaluation of  Outcomes: Not Met   RN Treatment Plan for Primary Diagnosis: Major depressive disorder, recurrent severe without psychotic features ([place]) Long Term Goal(s): Knowledge of disease and therapeutic regimen to maintain health will improve  Short Term Goals: Ability to remain free from injury will improve, Ability to verbalize frustration and anger appropriately will improve, Ability to demonstrate self-control, Ability to identify and develop effective coping behaviors will improve and Compliance with prescribed medications will improve  Medication Management: RN will administer medications as ordered by provider, will assess and evaluate patient's response and provide education to patient for prescribed medication. RN will report any adverse and/or side effects to prescribing provider.  Therapeutic Interventions: 1 on 1 counseling sessions, Psychoeducation, Medication administration, Evaluate responses to treatment, Monitor vital signs and CBGs as ordered, Perform/monitor CIWA, COWS, AIMS and Fall Risk screenings as ordered, Perform wound care treatments as ordered.  Evaluation of Outcomes: Not Met   LCSW Treatment Plan for Primary Diagnosis: Major depressive disorder, recurrent severe without psychotic features ([place]) Long Term Goal(s): Safe transition to appropriate next level of care at discharge, Engage patient in therapeutic group addressing interpersonal concerns.  Short Term Goals: Engage patient in aftercare planning with referrals and resources, Increase social support, Increase ability to appropriately verbalize feelings, Identify triggers associated with mental health/substance abuse issues and Increase skills for wellness and recovery  Therapeutic Interventions: Assess for all discharge needs, 1 to 1 time with Social worker, Explore available  resources and support systems, Assess for adequacy in community support network, Educate family and significant other(s) on suicide prevention,  Complete Psychosocial Assessment, Interpersonal group therapy.  Evaluation of Outcomes: Not Met   Progress in Treatment: Attending groups: No. Participating in groups: No. Taking medication as prescribed: Yes. Toleration medication: Yes. Family/Significant other contact made: No, will contact:  if consent is provided Patient understands diagnosis: Yes. Discussing patient identified problems/goals with staff: Yes. Medical problems stabilized or resolved: Yes. Denies suicidal/homicidal ideation: Yes. Issues/concerns per patient self-inventory: No.   New problem(s) identified: No, Describe:  none  New Short Term/Long Term Goal(s): medication stabilization, elimination of SI thoughts, development of comprehensive mental wellness plan.   Patient Goals:  Did not attend  Discharge Plan or Barriers: Patient recently admitted. CSW will continue to follow and assess for appropriate referrals and possible discharge planning.    Reason for Continuation of Hospitalization: Depression Medication stabilization Suicidal ideation  Estimated Length of Jordan: 3-5 days  Attendees: Patient: Did not attend 12/02/2020   Physician:  12/02/2020   Nursing:  12/02/2020   RN Care Manager: 12/02/2020   Social Worker: Darletta Moll, LCSW 12/02/2020   Recreational Therapist:  12/02/2020   Other:  12/02/2020   Other:  12/02/2020  Other: 12/02/2020       Scribe for Treatment Team: Vassie Moselle, [place] 12/02/2020 10:05 AM

## 2020-12-02 NOTE — Plan of Care (Signed)
Spoke to patient about his positive Hep C Ab and upward trend in LFTs. Advised that he avoid Tylenol and advised that we are checking Hep C viral load as well as trending his LFTs. He was advised that if he has active Hep C, there is treatment but he will need to be clean and sober from substances prior to treatment. Time given for questions.   Jared Crews, MD, Celene Skeen

## 2020-12-02 NOTE — Progress Notes (Signed)
   12/02/20 2105  Psych Admission Type (Psych Patients Only)  Admission Status Voluntary  Psychosocial Assessment  Patient Complaints Anxiety  Eye Contact Fair  Facial Expression Animated  Affect Appropriate to circumstance  Speech Logical/coherent  Interaction Assertive  Motor Activity Other (Comment) (WDL)  Appearance/Hygiene Improved  Behavior Characteristics Cooperative;Appropriate to situation  Mood Pleasant  Thought Process  Coherency WDL  Content WDL  Delusions None reported or observed  Perception WDL  Hallucination None reported or observed  Judgment Impaired  Confusion None  Danger to Self  Current suicidal ideation? Denies  Self-Injurious Behavior No self-injurious ideation or behavior indicators observed or expressed   Agreement Not to Harm Self Yes  Description of Agreement verbal contract  Danger to Others  Danger to Others None reported or observed

## 2020-12-02 NOTE — Progress Notes (Signed)
  Nursing Note: 0700-1900  D:  Pt reports passive thoughts of SI are "less than yesterday."  He does not have a plan and will come to staff if thoughts worsen.  States that he slept well last night, appetite is fair and is tolerating prescribed medication."     A:  Encouraged to verbalize needs and concerns, active listening and support provided.  Continued Q 15 minute safety checks.    R:  Pt. is pleasant and cooperative.  Denies A/V hallucinations and is able to verbally contract for safety.    12/02/20 0800  Psych Admission Type (Psych Patients Only)  Admission Status Voluntary  Psychosocial Assessment  Patient Complaints Anxiety  Eye Contact Fair  Facial Expression Animated  Affect Appropriate to circumstance  Speech Logical/coherent  Interaction Assertive  Motor Activity Other (Comment) (WDL)  Appearance/Hygiene Improved  Behavior Characteristics Appropriate to situation  Mood Anxious;Pleasant  Thought Process  Coherency WDL  Content WDL  Delusions None reported or observed  Perception WDL  Hallucination None reported or observed  Judgment Impaired  Confusion None  Danger to Self  Current suicidal ideation? Denies  Self-Injurious Behavior No self-injurious ideation or behavior indicators observed or expressed   Agreement Not to Harm Self Yes  Description of Agreement verbal  Danger to Others  Danger to Others None reported or observed  [place] NOVEL CORONAVIRUS (COVID-19) DAILY CHECK-OFF SYMPTOMS - answer yes or no to each - every day NO YES  Have you had a fever in the past 24 hours?  . Fever (Temp > 37.80C / 100F) X   Have you had any of these symptoms in the past 24 hours? . New Cough .  Sore Throat  .  Shortness of Breath .  Difficulty Breathing .  Unexplained Body Aches   X   Have you had any one of these symptoms in the past 24 hours not related to allergies?   . Runny Nose .  Nasal Congestion .  Sneezing   X   If you have had runny nose, nasal  congestion, sneezing in the past 24 hours, has it worsened?  X   EXPOSURES - check yes or no X   Have you traveled outside the state in the past 14 days?  X   Have you been in contact with someone with a confirmed diagnosis of COVID-19 or PUI in the past 14 days without wearing appropriate PPE?  X   Have you been living in the same home as a person with confirmed diagnosis of COVID-19 or a PUI (household contact)?    X   Have you been diagnosed with COVID-19?    X              What to do next: Answered NO to all: Answered YES to anything:   Proceed with unit schedule Follow the [facility] Inpatient Flowsheet.

## 2020-12-02 NOTE — [facility] Suicide Risk Assessment (Signed)
[facility] INPATIENT:  Family/Significant Other Suicide Prevention Education  Suicide Prevention Education:  Patient Refusal for Family/Significant Other Suicide Prevention Education: The patient Jared Jordan has refused to provide written consent for family/significant other to be provided Family/Significant Other Suicide Prevention Education during admission and/or prior to discharge.  Physician notified.  Felizardo Hoffmann 12/02/2020, 11:54 AM

## 2020-12-02 NOTE — Progress Notes (Signed)
[facility]  12/02/2020 11:38 AM Jared Jordan  MRN:  [id number] Subjective:  "I am doing okay today. I slept pretty well but I am anxious. I think the Remeron was giving me restless legs last night though."   Objective: Jared LankGiancarlo Helm is a 30 year old male with a past psychiatric history significant for MDD, recurrent severe and polysubstance abuse. He has had multiple inpatient psychiatric admissions and emergency room visits for drug overdose and suicidal ideation. [facility] ideation and a plan to shoot himself with his gun. He stated his friend took the bullets from him. He stated his gun is in his bag at his friends house. Patient was seen on the unit today. Chart reviewed and case discussed with the treatment team. Patient stated he slept pretty well last night. No sleep times were recorded by 300 hall staff this morning. He reported his appetite is good. He stated he feels the Remeron gave him restless legs last night and he stated he feels anxious today. He did receive Vistaril this morning. He stated that the Dr told him he may be Bipolar. He stated his brother has Bipolar. He describes feeling like he can't slow his mind down. He wants to try Remeron again tonight, he is in agreement to add a low dose antipsychotic to see if this helps his racing thoughts. Will add Zyprexa at bedtime and reassess tomorrow. Patient denies suicidal ideation or plan today. He stated "I just need to get it together because I don't want to use again." He denies withdrawal symptoms, his COWS score was 4 this morning with 1 for restlessness, 2 for tremor, and 1 for anxiety. He denies HI/AVH, paranoia and delusions. He stated he does not know where he is going to go from the hospital and will talk to social work to see what his options are. He is calm and cooperative. He maintains good eye contact, his hygiene is good. He is attending group therapy and interacting with staff and  peers appropriately. Will continue to monitor for safety. Encouragement and support provided.   Labs reviewed today: HCV Ab is reactive. Hep A is pending, Hep B surface and Core are negative. TSH 0.276. LFT's AST 59/ALT 93. A1c pending.  Will order Lipid panel since starting an antipsychotic and HCV RNA quant  Principal Problem: Major depressive disorder, recurrent severe without psychotic features (HCC) Diagnosis: Principal Problem:   Major depressive disorder, recurrent severe without psychotic features (HCC) Active Problems:   Substance induced mood disorder (HCC)  Total Time spent with patient: 25 minutes  Past Psychiatric History: See H&P  Past Medical History:  Past Medical History:  Diagnosis Date  . Drug use     Past Surgical History:  Procedure Laterality Date  . SHOULDER SURGERY    . SHOULDER SURGERY Left 2020   Family History: History reviewed. No pertinent family history. Family Psychiatric  History: See H&P Social History:  Social History   Substance and Sexual Activity  Alcohol Use Yes  . Alcohol/week: 4.0 - 6.0 standard drinks  . Types: 4 - 6 Cans of beer per week   Comment: 2-3 times weekly     Social History   Substance and Sexual Activity  Drug Use Not Currently  . Types: IV, Methamphetamines   Comment: using 1gm everyday heroin    Social History   Socioeconomic History  . Marital status: Single    Spouse name: Not on file  . Number of children:  Not on file  . Years of education: Not on file  . Highest education level: Not on file  Occupational History  . Not on file  Tobacco Use  . Smoking status: Current Every Day Smoker    Packs/day: 0.25    Years: 1.00    Pack years: 0.25  . Smokeless tobacco: Never Used  Vaping Use  . Vaping Use: Some days  . Start date: 11/04/2018  . Devices: Jewel Menthol  Substance and Sexual Activity  . Alcohol use: Yes    Alcohol/week: 4.0 - 6.0 standard drinks    Types: 4 - 6 Cans of beer per week     Comment: 2-3 times weekly  . Drug use: Not Currently    Types: IV, Methamphetamines    Comment: using 1gm everyday heroin  . Sexual activity: Yes  Other Topics Concern  . Not on file  Social History Narrative  . Not on file   Social Determinants of Health   Financial Resource Strain: Not on file  Food Insecurity: Not on file  Transportation Needs: Not on file  Physical Activity: Not on file  Stress: Not on file  Social Connections: Not on file   Additional Social History:   Sleep: Good  Appetite:  Good  Current Medications: Current Facility-Administered Medications  Medication Dose Route Frequency Provider Last Rate Last Admin  . alum & mag hydroxide-simeth (MAALOX/MYLANTA) 200-200-20 MG/5ML suspension 30 mL  30 mL Oral Q4H PRN Ophelia Shoulder E, NP      . dicyclomine (BENTYL) tablet 20 mg  20 mg Oral Q6H PRN Mason Jim, Amy E, MD      . gabapentin (NEURONTIN) capsule 200 mg  200 mg Oral TID Ophelia Shoulder E, NP   200 mg at 12/02/20 0753  . hydrOXYzine (ATARAX/VISTARIL) tablet 25 mg  25 mg Oral Q6H PRN Comer Locket, MD   25 mg at 12/02/20 0951  . loperamide (IMODIUM) capsule 2-4 mg  2-4 mg Oral PRN Mason Jim, Amy E, MD      . magnesium hydroxide (MILK OF MAGNESIA) suspension 30 mL  30 mL Oral Daily PRN Ophelia Shoulder E, NP      . methocarbamol (ROBAXIN) tablet 500 mg  500 mg Oral Q8H PRN Mason Jim, Amy E, MD      . mirtazapine (REMERON) tablet 15 mg  15 mg Oral QHS Laveda Abbe, NP   15 mg at 12/01/20 2135  . naproxen (NAPROSYN) tablet 500 mg  500 mg Oral BID PRN Comer Locket, MD      . nicotine (NICODERM CQ - dosed in mg/24 hours) patch 21 mg  21 mg Transdermal Daily Comer Locket, MD   21 mg at 12/02/20 0754  . ondansetron (ZOFRAN-ODT) disintegrating tablet 4 mg  4 mg Oral Q6H PRN Comer Locket, MD      . traZODone (DESYREL) tablet 50 mg  50 mg Oral QHS PRN Chales Abrahams, NP   50 mg at 12/01/20 2135    Lab Results:  Results for orders placed or  performed during the hospital encounter of 11/30/20 (from the past 48 hour(s))  Comprehensive metabolic panel     Status: Abnormal   Collection Time: 12/01/20  6:10 PM  Result Value Ref Range   Sodium 137 135 - 145 mmol/L   Potassium 4.8 3.5 - 5.1 mmol/L   Chloride 102 98 - 111 mmol/L   CO2 25 22 - 32 mmol/L   Glucose, Bld 132 (H) 70 - 99 mg/dL  Comment: Glucose reference range applies only to samples taken after fasting for at least 8 hours.   BUN 15 6 - 20 mg/dL   Creatinine, Ser 3.66 0.61 - 1.24 mg/dL   Calcium 9.7 8.9 - 44.0 mg/dL   Total Protein 7.9 6.5 - 8.1 g/dL   Albumin 4.5 3.5 - 5.0 g/dL   AST 59 (H) 15 - 41 U/L   ALT 93 (H) 0 - 44 U/L   Alkaline Phosphatase 77 38 - 126 U/L   Total Bilirubin 0.6 0.3 - 1.2 mg/dL   GFR, Estimated >34 >74 mL/min    Comment: (NOTE) Calculated using the CKD-EPI Creatinine Equation (2021)    Anion gap 10 5 - 15    Comment: Performed at [facility], [address] [place], [place] [address]  TSH     Status: Abnormal   Collection Time: 12/01/20  6:10 PM  Result Value Ref Range   TSH 0.276 (L) 0.350 - 4.500 uIU/mL    Comment: Performed by a 3rd Generation assay with a functional sensitivity of <=0.01 uIU/mL. Performed at [facility], [address], [place], [place] [address]     Blood Alcohol level:  Lab Results  Component Value Date   ETH <10 11/30/2020   ETH <10 11/21/2019    Metabolic Disorder Labs: Lab Results  Component Value Date   HGBA1C 5.4 11/23/2019   MPG 108.28 11/23/2019   No results found for: PROLACTIN Lab Results  Component Value Date   CHOL 185 11/23/2019   TRIG 91 11/23/2019   HDL 51 11/23/2019   CHOLHDL 3.6 11/23/2019   VLDL 18 11/23/2019   LDLCALC 116 (H) 11/23/2019    Physical Findings: AIMS: Facial and Oral Movements Muscles of Facial Expression: None, normal Lips and Perioral Area: None, normal Jaw: None, normal Tongue: None, normal,Extremity  Movements Upper (arms, wrists, hands, fingers): None, normal Lower (legs, knees, ankles, toes): None, normal, Trunk Movements Neck, shoulders, hips: None, normal, Overall Severity Severity of abnormal movements (highest score from questions above): None, normal Incapacitation due to abnormal movements: None, normal Patient's awareness of abnormal movements (rate only patient's report): No Awareness, Dental Status Current problems with teeth and/or dentures?: No Does patient usually wear dentures?: No  CIWA:  CIWA-Ar Total: 3 COWS:  COWS Total Score: 4  Musculoskeletal: Strength & Muscle Tone: within normal limits Gait & Station: normal Patient leans: N/A  Psychiatric Specialty Exam:  Presentation  General Appearance: Appropriate for Environment; Casual; Fairly Groomed  Eye Contact:Fair  Speech:Clear and Coherent; Normal Rate  Speech Volume:Normal  Handedness:Right   Mood and Affect  Mood:Depressed; Hopeless; Anxious  Affect:Congruent; Depressed   Thought Process  Thought Processes:Coherent; Linear  Descriptions of Associations:Intact  Orientation:Full (Time, Place and Person)  Thought Content:Logical  History of Schizophrenia/Schizoaffective disorder:No  Duration of Psychotic Symptoms:No data recorded Hallucinations:Hallucinations: None  Ideas of Reference:None  Suicidal Thoughts:Suicidal Thoughts: Yes, Active SI Active Intent and/or Plan: With Plan; With Means to Carry Out  Homicidal Thoughts:Homicidal Thoughts: No  Sensorium  Memory:Immediate Fair; Recent Fair; Remote Fair  Judgment:Fair  Insight:Fair  Executive Functions  Concentration:Fair  Attention Span:Fair  Recall:Fair  Fund of Knowledge:Good  Language:Good  Psychomotor Activity  Psychomotor Activity:Psychomotor Activity: Normal  Assets  Assets:Communication Skills; Desire for Improvement; Resilience; Social Support; Leisure Time  Sleep  Sleep:Sleep: Fair Number of Hours of  Sleep: 6.5  Physical Exam: Physical Exam Vitals and nursing note reviewed.  Constitutional:      Appearance: Normal appearance.  HENT:     Head: Normocephalic.  Pulmonary:     Effort: Pulmonary effort is normal.  Musculoskeletal:        General: Normal range of motion.     Cervical back: Normal range of motion.  Neurological:     Mental Status: He is alert and oriented to person, place, and time.  Psychiatric:        Attention and Perception: Attention normal. He does not perceive auditory or visual hallucinations.        Mood and Affect: Mood normal.        Speech: Speech normal.        Behavior: Behavior normal. Behavior is cooperative.        Thought Content: Thought content normal. Thought content is not paranoid or delusional. Thought content does not include homicidal or suicidal ideation. Thought content does not include homicidal or suicidal plan.        Cognition and Memory: Cognition normal.    Review of Systems  Constitutional: Negative for fever.  HENT: Negative for congestion and sore throat.   Respiratory: Negative for cough and shortness of breath.   Cardiovascular: Negative for chest pain.  Gastrointestinal: Negative.   Genitourinary: Negative.   Musculoskeletal: Negative.   Neurological: Negative.    Blood pressure 112/84, pulse 98, temperature 98.4 F (36.9 C), temperature source Oral, resp. rate 16, height 5\' 11"  (1.803 m), weight 76.2 kg, SpO2 98 %. Body mass index is 23.43 kg/m.   Treatment Plan Summary: Daily contact with patient to assess and evaluate symptoms and progress in treatment and Medication management   Depression: -Continue Remeron 15 mg PO at bedtime -Initiate Zyprexa 5 mg PO at bedtime   Insomnia:  -Continue Trazodone 50 mg PO at bedtime PRN  Anxiety: -Continue Vistaril 25 mg PO TID PRN  Continue COWS protocol for opiate withdrawal. COWS score at 0800 today was 4. Patient denies withdrawal symptoms.    ,  NP 12/02/2020, 11:38 AM

## 2020-12-02 NOTE — Progress Notes (Signed)
Adult Psychoeducational Group Note  Date:  12/02/2020 Time:  8:45 AM  Group Topic/Focus:  Goals Group:   The focus of this group is to help patients establish daily goals to achieve during treatment and discuss how the patient can incorporate goal setting into their daily lives to aide in recovery.  Participation Level:  Active  Participation Quality:  Appropriate  Affect:  Appropriate  Cognitive:  Alert and Appropriate  Insight: Appropriate, Good and Improving  Engagement in Group:  Engaged  Modes of Intervention:  Discussion  Additional Comments:  Pt goal for the day is to speak to the dr and to try to have a good day.  Jared Jordan 12/02/2020, 8:45 AM

## 2020-12-02 NOTE — Progress Notes (Signed)
Recreation Therapy Notes  Date:  5.30.22 Time: 0930 Location: [address] Dayroom  Group Topic: Stress Management  Goal Area(s) Addresses:  Patient will identify positive stress management techniques. Patient will identify benefits of using stress management post d/c.  Behavioral Response: Engaged  Intervention: Stress Management  Activity :  Meditation.  LRT played a meditation that focused on being persistent in not only meditation but in life as well.  Patients were to listen and follow along as meditation played to fully engage in activity.  Education:  Stress Management, Discharge Planning.   Education Outcome: Acknowledges Education  Clinical Observations/Feedback: Pt attended and participated.  Pt expressed no concerns or had any questions.    Caroll Rancher, LRT/CTRS         Caroll Rancher A 12/02/2020 11:56 AM

## 2020-12-02 NOTE — B[facility] Counselor (Signed)
CSW gave pt information on [employer] and [employer]. CSW explained both programs and their length.   Fredirick Lathe, LCSWA Clinicial Social Worker [employer]

## 2020-12-02 NOTE — B[facility] Counselor (Signed)
Adult Comprehensive Assessment  Patient ID: Jared Jordan, male   DOB: 1990/12/31, 30 y.o.   MRN: [id number]  Information Source: Information source: Patient  Current Stressors:  Patient states their primary concerns and needs for treatment are:: "I lost my job and apartment so I was on the verge of suicide. My room mate took my gun and told me that I needed to go to the hospital and get help/ " Patient states their goals for this hospitilization and ongoing recovery are:: "Get better and get on the right medications" Educational / Learning stressors: None reported Employment / Job issues: Reports that he recently lost job Family Relationships: "They love me but I push them to their limits and they don't have the resources to help me." Financial / Lack of resources (include bankruptcy): No income; No health insurance Housing / Lack of housing: Reports that he recently lost his apartment  Physical health (include injuries & life threatening diseases): Denies any current stressors Social relationships: Denies any current stressors; "I have no friends" Substance abuse: Endorsed using heroin (opiates) and alcohol "every few days"; reports he recently relapsed but he has not had any withdrawal symptoms. Bereavement / Loss: Reports he continues to struggle with the death of his father  Living/Environment/Situation:  Living Arrangements: Non-relatives/Friends Living conditions (as described by patient or guardian): "reports that he recently lost his apartment" Who else lives in the home?: 2 roommates How long has patient lived in current situation?: Since march 2021 What is atmosphere in current home: Comfortable  Family History:  Marital status: Single Are you sexually active?: No What is your sexual orientation?: Heterosexual Has your sexual activity been affected by drugs, alcohol, medication, or emotional stress?: No Does patient have children?: No  Childhood History:  By whom  was/is the patient raised?: Both parents Additional childhood history information: Reports his parents were divorced when he was a child, however both parents were active Description of patient's relationship with caregiver when they were a child: Reports having a close relationship with both parents during his childhood Patient's description of current relationship with people who raised him/her: Reports having a distant relationship with his mother currently. Patient shared his father is currently deceased How were you disciplined when you got in trouble as a child/adolescent?: Spankings; Verbally Does patient have siblings?: Yes Number of Siblings: 3 Description of patient's current relationship with siblings: Reports having an "okay" relationship with his sisters, however he reports having a strained relationship with his brother Did patient suffer any verbal/emotional/physical/sexual abuse as a child?: No Did patient suffer from severe childhood neglect?: No Has patient ever been sexually abused/assaulted/raped as an adolescent or adult?: No Was the patient ever a victim of a crime or a disaster?: No Witnessed domestic violence?: No Has patient been effected by domestic violence as an adult?: No  Education:  Highest grade of school patient has completed: 12th grade; Some college Currently a student?: No Learning disability?: No  Employment/Work Situation:   Employment situation: Unemployed Patient's job has been impacted by current illness: No What is the longest time patient has a held a job?: 6 months 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?: No Are There Guns or Other Weapons in Your Home?: No  Financial Resources:   Financial resources: No income Does patient have a Lawyer or guardian?: No  Alcohol/Substance Abuse:   What has been  your use  of drugs/alcohol within the last 12 months?: reports using 1/2 gram of heroin "every few days" and drinking alcohol "till I black out" "every few days If attempted suicide, did drugs/alcohol play a role in this?: Yes, reports multiple overdoses Alcohol/Substance Abuse Treatment Hx: Denies past history Has alcohol/substance abuse ever caused legal problems?: No  Social Support System:   Forensic psychologist System: None Describe Community Support System: "No one" Type of faith/religion: None How does patient's faith help to cope with current illness?: N/A  Leisure/Recreation:   Leisure and Hobbies: "Nothing currently"  Strengths/Needs:   What is the patient's perception of their strengths?: "Nothing currently" Patient states they can use these personal strengths during their treatment to contribute to their recovery: No Patient states these barriers may affect/interfere with their treatment: No Patient states these barriers may affect their return to the community: N/A Other important information patient would like considered in planning for their treatment: N/A  Discharge Plan:   Currently receiving community mental health services: Yes (From Whom) Patient states concerns and preferences for aftercare planning are: Patient expressed interest in residential treatment services at discharge Patient states they will know when they are safe and ready for discharge when: To be determined Does patient have access to transportation?: No Does patient have financial barriers related to discharge medications?: Yes Patient description of barriers related to discharge medications: No income; No health insurance Plan for no access to transportation at discharge: No Plan for living situation after discharge: Patient expressed interest in residential treatment Will patient be returning to same living situation after discharge?: No  Summary/Recommendations:   Summary and  Recommendations (to be completed by the evaluator): Jared Jordan is a 30 year old male who is diagnosed with Substance induced mood disorder. He presented to the hospital seeking treatment for opiate dependence, alcohol dependence and depressive symptoms. During the assessment, Jared Jordan was pleasant and cooperative with providing information. Jared Jordan reports that he decided to come to the hospital because "this was the last attempt for me to get my life together". Jared Jordan states that he is very interested in residential treatment. Jared Jordan can benefit from crisis stabilization, medication management, therapeutic milieu and referral services.    Felizardo Hoffmann. 12/02/2020

## 2020-12-03 DIAGNOSIS — F1994 Other psychoactive substance use, unspecified with psychoactive substance-induced mood disorder: Secondary | ICD-10-CM | POA: Diagnosis not present

## 2020-12-03 DIAGNOSIS — F332 Major depressive disorder, recurrent severe without psychotic features: Secondary | ICD-10-CM | POA: Diagnosis not present

## 2020-12-03 LAB — LIPID PANEL
Cholesterol: 174 mg/dL (ref 0–200)
HDL: 61 mg/dL (ref >40)
LDL Cholesterol: 96 mg/dL (ref 0–99)
Total CHOL/HDL Ratio: 2.9 RATIO
Triglycerides: 84 mg/dL (ref <150)
VLDL: 17 mg/dL (ref 0–40)

## 2020-12-03 LAB — HEPATIC FUNCTION PANEL
ALT: 89 U/L — ABNORMAL HIGH (ref 0–44)
AST: 49 U/L — ABNORMAL HIGH (ref 15–41)
Albumin: 4.3 g/dL (ref 3.5–5.0)
Alkaline Phosphatase: 68 U/L (ref 38–126)
Bilirubin, Direct: <0.1 mg/dL (ref 0.0–0.2)
Total Bilirubin: 0.5 mg/dL (ref 0.3–1.2)
Total Protein: 7.1 g/dL (ref 6.5–8.1)

## 2020-12-03 LAB — T4, FREE: Free T4: 0.76 ng/dL (ref 0.61–1.12)

## 2020-12-03 NOTE — Progress Notes (Signed)
Recreation Therapy Notes  Animal-Assisted Activity (AAA) Program Checklist/Progress Notes Patient Eligibility Criteria Checklist & Daily Group note for Rec Tx Intervention  Date: 5.31.22 Time: 1430 Location: [address]   AAA/T Program Assumption of Risk Form signed by Engineer, production or Parent Legal Guardian YES   Patient is free of allergies or severe asthma  YES  Patient reports no fear of animals  YES  Patient reports no history of cruelty to animals YES   Patient understands his/her participation is voluntary YES  Patient washes hands before animal contact  YES   Patient washes hands after animal contact  YES  Behavioral Response: Engaged  Education: Charity fundraiser, Appropriate Animal Interaction   Education Outcome: Acknowledges understanding/In group clarification offered/Needs additional education.   Clinical Observations/Feedback:  Pt attended and participated in group activity.    Caroll Rancher, LRT/CTRS     Caroll Rancher A 12/03/2020 3:39 PM

## 2020-12-03 NOTE — Progress Notes (Signed)
Pt did not attend goals group. 

## 2020-12-03 NOTE — Progress Notes (Signed)
[facility] Group Notes:  (Nursing/MHT/Case Management/Adjunct)  Date:  12/03/2020  Time:  2000 Type of Therapy:  wrap up group  Participation Level:  Active  Participation Quality:  Appropriate, Attentive, Sharing and Supportive  Affect:  Appropriate and Irritable  Cognitive:  Appropriate  Insight:  Improving  Engagement in Group:  Engaged  Modes of Intervention:  Clarification, Education and Support  Summary of Progress/Problems: Positive thinking and positive change were discussed.   Marcille Buffy 12/03/2020, 8:50 PM

## 2020-12-03 NOTE — Progress Notes (Signed)
D: Patient presents with pleasant affect at time of assessment and report mild anxiety. Patient denies SI/HI at this time. Patient also denies AH/VH at this time. Patient contracts for safety.  A: Provided positive reinforcement and encouragement.  R: Patient cooperative and receptive to efforts. Patient remains safe on the unit.   12/03/20 2103  Psych Admission Type (Psych Patients Only)  Admission Status Voluntary  Psychosocial Assessment  Patient Complaints Anxiety  Eye Contact Fair  Facial Expression Animated;Anxious  Affect Appropriate to circumstance  Speech Logical/coherent  Interaction Assertive  Motor Activity Other (Comment) (WDL)  Appearance/Hygiene Improved  Behavior Characteristics Cooperative;Appropriate to situation  Mood Anxious;Pleasant  Thought Process  Coherency WDL  Content WDL  Delusions None reported or observed  Perception WDL  Hallucination None reported or observed  Judgment Impaired  Confusion None  Danger to Self  Current suicidal ideation? Denies  Self-Injurious Behavior No self-injurious ideation or behavior indicators observed or expressed   Agreement Not to Harm Self Yes  Description of Agreement Verbal contract  Danger to Others  Danger to Others None reported or observed

## 2020-12-03 NOTE — Progress Notes (Signed)
   12/03/20 0900  Psych Admission Type (Psych Patients Only)  Admission Status Voluntary  Psychosocial Assessment  Patient Complaints Anxiety  Eye Contact Fair  Facial Expression Animated  Affect Appropriate to circumstance  Speech Logical/coherent  Interaction Assertive  Motor Activity Other (Comment) (WDL)  Appearance/Hygiene Improved  Behavior Characteristics Cooperative  Mood Irritable  Thought Process  Coherency WDL  Content WDL  Delusions None reported or observed  Perception WDL  Hallucination None reported or observed  Judgment Impaired  Confusion None  Danger to Self  Current suicidal ideation? Denies  Self-Injurious Behavior No self-injurious ideation or behavior indicators observed or expressed   Agreement Not to Harm Self Yes  Description of Agreement verbal contract  Danger to Others  Danger to Others None reported or observed

## 2020-12-03 NOTE — [facility] Counselor (Signed)
CSW gave pt a packet of homelessness resources.   Tennile Styles, LCSWA Clinicial Social Worker [facility] 

## 2020-12-03 NOTE — Progress Notes (Signed)
Pt participated in a fun activity in group. 

## 2020-12-03 NOTE — Progress Notes (Addendum)
[facility] MD Progress Note  12/03/2020 11:05 AM Jared Jordan  MRN:  [id number] Subjective:  "I am doing okay today. I slept pretty well but I am anxious. I think the Remeron was giving me restless legs last night though."   Objective: Jared Jordan is a 30 year old male with a past psychiatric history significant for MDD, recurrent severe and polysubstance abuse. He has had multiple inpatient psychiatric admissions and emergency room visits for drug overdose and suicidal ideation. He presented to [facility] with suicidal ideation and a plan to shoot himself with his gun. He stated his friend took the bullets from him. He stated his gun is in his bag at his friends house.  Patient was seen on the unit today. Chart reviewed and case discussed with the treatment team. Patient initially told CSW he wanted to go to a long term substance abuse treatment like [facility] or  [facility]. CSW gave him an application for [facility] and when he saw it was also a work program he stated he wasn't sure this was the right place for him. Patient appears anxious and stated he doesn't know what he wants to do. Patient becomes hesitant when the subject of "work" programs come up, he will not make a commitment. This Clinical research associate and CSW have explained to patient that he needs to figure out where he is going when he is discharged. He stated "you all are throwing a lot at me this morning." Patient then started requesting to get a phone number from his cell phone. When told no, he became agitated, got his belongings from his room and said he was leaving. He then went to his room and did not attend group. There is a strong suspicion of malingering since he reports anxiety and depression however when observed he is not acting depressed or anxious. He also will not make a commitment or plan for discharge. He is currently upset that he is not leaving today and intentionally getting other patients on the 300 hall upset and  requesting to leave. He is also inconsistent with his symptoms of anxiety and depression from one provider to the next.     Patient is taking his medications and mentions no issues with them. Patient stated he slept pretty well last night, he slept 6.75 hours last night. He reported his appetite is good. He did receive Vistaril this morning.  Patient denied suicidal ideation or plan today. He denied HI/AVH, paranoia and delusions. He has along history of opiate abuse and overdose. His UDS was positive for opiates.  He denies withdrawal symptoms, his COWS score was 0, will discontinue COWS today, he has not needed any COWS PRN medications. Vital signs are stable; BP 117/88, pulse 98, temp 98.1 O2 100%. He has not had any PRN medication for anxiety this morning. Will continue to monitor for safety. Encouragement and support provided.    Patient has Hep C and was made aware of this today, he stated his doctor has told him he will need a liver transplant in a year. Record review reveals no information about this, unclear what doctor  His TSH is low and he was made aware he needs to follow up with a PCP after discharge. I explained that his liver enzymes are only slightly elevated and there is treatment for Hep C but it is up to him to follow through.  Labs reviewed today: HCV Ab is reactive. Hep A is pending, Hep B surface and Core are negative.  LFT's AST 49/ALT 89.  Lipid panel normal. HCV RNA quant and A1c pending.    Principal Problem: Major depressive disorder, recurrent severe without psychotic features (HCC) Diagnosis: Principal Problem:   Major depressive disorder, recurrent severe without psychotic features (HCC) Active Problems:   Substance induced mood disorder (HCC)  Total Time spent with patient: 25 minutes  Past Psychiatric History: See H&P  Past Medical History:  Past Medical History:  Diagnosis Date  . Drug use     Past Surgical History:  Procedure Laterality Date  . SHOULDER  SURGERY    . SHOULDER SURGERY Left 2020   Family History: History reviewed. No pertinent family history. Family Psychiatric  History: See H&P Social History:  Social History   Substance and Sexual Activity  Alcohol Use Yes  . Alcohol/week: 4.0 - 6.0 standard drinks  . Types: 4 - 6 Cans of beer per week   Comment: 2-3 times weekly     Social History   Substance and Sexual Activity  Drug Use Not Currently  . Types: IV, Methamphetamines   Comment: using 1gm everyday heroin    Social History   Socioeconomic History  . Marital status: Single    Spouse name: Not on file  . Number of children: Not on file  . Years of education: Not on file  . Highest education level: Not on file  Occupational History  . Not on file  Tobacco Use  . Smoking status: Current Every Day Smoker    Packs/day: 0.25    Years: 1.00    Pack years: 0.25  . Smokeless tobacco: Never Used  Vaping Use  . Vaping Use: Some days  . Start date: 11/04/2018  . Devices: Jewel Menthol  Substance and Sexual Activity  . Alcohol use: Yes    Alcohol/week: 4.0 - 6.0 standard drinks    Types: 4 - 6 Cans of beer per week    Comment: 2-3 times weekly  . Drug use: Not Currently    Types: IV, Methamphetamines    Comment: using 1gm everyday heroin  . Sexual activity: Yes  Other Topics Concern  . Not on file  Social History Narrative  . Not on file   Social Determinants of Health   Financial Resource Strain: Not on file  Food Insecurity: Not on file  Transportation Needs: Not on file  Physical Activity: Not on file  Stress: Not on file  Social Connections: Not on file   Additional Social History:   Sleep: Good  Appetite:  Good  Current Medications: Current Facility-Administered Medications  Medication Dose Route Frequency Provider Last Rate Last Admin  . alum & mag hydroxide-simeth (MAALOX/MYLANTA) 200-200-20 MG/5ML suspension 30 mL  30 mL Oral Q4H PRN Ophelia Shoulder E, NP      . dicyclomine (BENTYL)  tablet 20 mg  20 mg Oral Q6H PRN Mason Jim, Amy E, MD      . gabapentin (NEURONTIN) capsule 200 mg  200 mg Oral TID Ophelia Shoulder E, NP   200 mg at 12/03/20 0913  . hydrOXYzine (ATARAX/VISTARIL) tablet 25 mg  25 mg Oral Q6H PRN Comer Locket, MD   25 mg at 12/02/20 2105  . loperamide (IMODIUM) capsule 2-4 mg  2-4 mg Oral PRN Mason Jim, Amy E, MD      . magnesium hydroxide (MILK OF MAGNESIA) suspension 30 mL  30 mL Oral Daily PRN Ophelia Shoulder E, NP      . methocarbamol (ROBAXIN) tablet 500 mg  500 mg Oral Q8H PRN  Comer Locket, MD      . mirtazapine (REMERON) tablet 15 mg  15 mg Oral QHS Laveda Abbe, NP   15 mg at 12/02/20 2105  . naproxen (NAPROSYN) tablet 500 mg  500 mg Oral BID PRN Comer Locket, MD      . nicotine (NICODERM CQ - dosed in mg/24 hours) patch 21 mg  21 mg Transdermal Daily Comer Locket, MD   21 mg at 12/03/20 0913  . OLANZapine zydis (ZYPREXA) disintegrating tablet 5 mg  5 mg Oral QHS Laveda Abbe, NP   5 mg at 12/02/20 2105  . ondansetron (ZOFRAN-ODT) disintegrating tablet 4 mg  4 mg Oral Q6H PRN Comer Locket, MD      . traZODone (DESYREL) tablet 50 mg  50 mg Oral QHS PRN Chales Abrahams, NP   50 mg at 12/02/20 2105    Lab Results:  Results for orders placed or performed during the hospital encounter of 11/30/20 (from the past 48 hour(s))  Hepatitis panel, acute     Status: Abnormal (Preliminary result)   Collection Time: 12/01/20  6:10 PM  Result Value Ref Range   Hepatitis B Surface Ag NON REACTIVE NON REACTIVE   HCV Ab Reactive (A) NON REACTIVE    Comment: (NOTE) The CDC recommends that a Reactive HCV antibody result be followed up  with a HCV Nucleic Acid Amplification test.     Hep A IgM PENDING NON REACTIVE   Hep B C IgM NON REACTIVE NON REACTIVE    Comment: Performed at [facility], [address], [place], [place] [address]  Comprehensive metabolic panel     Status: Abnormal   Collection Time: 12/01/20  6:10 PM   Result Value Ref Range   Sodium 137 135 - 145 mmol/L   Potassium 4.8 3.5 - 5.1 mmol/L   Chloride 102 98 - 111 mmol/L   CO2 25 22 - 32 mmol/L   Glucose, Bld 132 (H) 70 - 99 mg/dL    Comment: Glucose reference range applies only to samples taken after fasting for at least 8 hours.   BUN 15 6 - 20 mg/dL   Creatinine, Ser 3.66 0.61 - 1.24 mg/dL   Calcium 9.7 8.9 - 29.4 mg/dL   Total Protein 7.9 6.5 - 8.1 g/dL   Albumin 4.5 3.5 - 5.0 g/dL   AST 59 (H) 15 - 41 U/L   ALT 93 (H) 0 - 44 U/L   Alkaline Phosphatase 77 38 - 126 U/L   Total Bilirubin 0.6 0.3 - 1.2 mg/dL   GFR, Estimated >76 >54 mL/min    Comment: (NOTE) Calculated using the CKD-EPI Creatinine Equation (2021)    Anion gap 10 5 - 15    Comment: Performed at [facility], [address] [place], [place] [address]  TSH     Status: Abnormal   Collection Time: 12/01/20  6:10 PM  Result Value Ref Range   TSH 0.276 (L) 0.350 - 4.500 uIU/mL    Comment: Performed by a 3rd Generation assay with a functional sensitivity of <=0.01 uIU/mL. Performed at [facility], [address] [place], [place] [address]   T4, free     Status: None   Collection Time: 12/03/20  6:44 AM  Result Value Ref Range   Free T4 0.76 0.61 - 1.12 ng/dL    Comment: (NOTE) Biotin ingestion may interfere with free T4 tests. If the results are inconsistent with the  TSH level, previous test results, or the clinical presentation, then consider biotin interference. If needed, order repeat testing after stopping biotin. Performed at [facility], [address] [place], [place] [address]   Lipid panel     Status: None   Collection Time: 12/03/20  6:44 AM  Result Value Ref Range   Cholesterol 174 0 - 200 mg/dL   Triglycerides 84 <604<150 mg/dL   HDL 61 >54>40 mg/dL   Total CHOL/HDL Ratio 2.9 RATIO   VLDL 17 0 - 40 mg/dL   LDL Cholesterol 96 0 - 99 mg/dL    Comment:        Total Cholesterol/HDL:CHD Risk Coronary Heart  Disease Risk Table                     Men   Women  1/2 Average Risk   3.4   3.3  Average Risk       5.0   4.4  2 X Average Risk   9.6   7.1  3 X Average Risk  23.4   11.0        Use the calculated Patient Ratio above and the CHD Risk Table to determine the patient's CHD Risk.        ATP III CLASSIFICATION (LDL):  <100     mg/dL   Optimal  [id number]  mg/dL   Near or Above                    Optimal  130-159  mg/dL   Borderline  [id number]  mg/dL   High  >562>190     mg/dL   Very High Performed at [facility], [address] [place], [place] [address]   Hepatic function panel     Status: Abnormal   Collection Time: 12/03/20  6:44 AM  Result Value Ref Range   Total Protein 7.1 6.5 - 8.1 g/dL   Albumin 4.3 3.5 - 5.0 g/dL   AST 49 (H) 15 - 41 U/L   ALT 89 (H) 0 - 44 U/L   Alkaline Phosphatase 68 38 - 126 U/L   Total Bilirubin 0.5 0.3 - 1.2 mg/dL   Bilirubin, Direct <5.7<0.1 0.0 - 0.2 mg/dL   Indirect Bilirubin NOT CALCULATED 0.3 - 0.9 mg/dL    Comment: Performed at [facility], [address] [place], [place] [address]    Blood Alcohol level:  Lab Results  Component Value Date   ETH <10 11/30/2020   ETH <10 11/21/2019    Metabolic Disorder Labs: Lab Results  Component Value Date   HGBA1C 5.4 11/23/2019   MPG 108.28 11/23/2019   No results found for: PROLACTIN Lab Results  Component Value Date   CHOL 174 12/03/2020   TRIG 84 12/03/2020   HDL 61 12/03/2020   CHOLHDL 2.9 12/03/2020   VLDL 17 12/03/2020   LDLCALC 96 12/03/2020   LDLCALC 116 (H) 11/23/2019    Physical Findings: AIMS: Facial and Oral Movements Muscles of Facial Expression: None, normal Lips and Perioral Area: None, normal Jaw: None, normal Tongue: None, normal,Extremity Movements Upper (arms, wrists, hands, fingers): None, normal Lower (legs, knees, ankles, toes): None, normal, Trunk Movements Neck, shoulders, hips: None, normal, Overall Severity Severity of  abnormal movements (highest score from questions above): None, normal Incapacitation due to abnormal movements: None, normal Patient's awareness of abnormal movements (rate only patient's report): No Awareness, Dental Status Current problems with teeth and/or dentures?: No Does  patient usually wear dentures?: No  CIWA:  CIWA-Ar Total: 3 COWS:  COWS Total Score: 4  Musculoskeletal: Strength & Muscle Tone: within normal limits Gait & Station: normal Patient leans: N/A  Psychiatric Specialty Exam:  Presentation  General Appearance: Appropriate for Environment; Casual; Fairly Groomed  Eye Contact:Fair  Speech:Clear and Coherent; Normal Rate  Speech Volume:Normal  Handedness:Right   Mood and Affect  Mood:Depressed; Hopeless; Anxious  Affect:Congruent; Depressed   Thought Process  Thought Processes:Coherent; Linear  Descriptions of Associations:Intact  Orientation:Full (Time, Place and Person)  Thought Content:Logical  History of Schizophrenia/Schizoaffective disorder:No  Duration of Psychotic Symptoms:No data recorded Hallucinations:No data recorded  Ideas of Reference:None  Suicidal Thoughts:No data recorded  Homicidal Thoughts:No data recorded  Sensorium  Memory:Immediate Fair; Recent Fair; Remote Fair  Judgment:Fair  Insight:Fair  Executive Functions  Concentration:Fair  Attention Span:Fair  Recall:Fair  Fund of Knowledge:Good  Language:Good  Psychomotor Activity  Psychomotor Activity:No data recorded  Assets  Assets:Communication Skills; Desire for Improvement; Resilience; Social Support; Leisure Time  Sleep  Sleep:No data recorded  Physical Exam: Physical Exam Vitals and nursing note reviewed.  Constitutional:      Appearance: Normal appearance.  HENT:     Head: Normocephalic.  Pulmonary:     Effort: Pulmonary effort is normal.  Musculoskeletal:        General: Normal range of motion.     Cervical back: Normal range of motion.   Neurological:     Mental Status: He is alert and oriented to person, place, and time.  Psychiatric:        Attention and Perception: Attention normal. He does not perceive auditory or visual hallucinations.        Mood and Affect: Mood normal.        Speech: Speech normal.        Behavior: Behavior normal. Behavior is cooperative.        Thought Content: Thought content normal. Thought content is not paranoid or delusional. Thought content does not include homicidal or suicidal ideation. Thought content does not include homicidal or suicidal plan.        Cognition and Memory: Cognition normal.    Review of Systems  Constitutional: Negative for fever.  HENT: Negative for congestion and sore throat.   Respiratory: Negative for cough and shortness of breath.   Cardiovascular: Negative for chest pain.  Gastrointestinal: Negative.   Genitourinary: Negative.   Musculoskeletal: Negative.   Neurological: Negative.    Blood pressure 117/88, pulse 98, temperature 98.1 F (36.7 C), temperature source Oral, resp. rate 16, height  (1.803 m), weight 76.2 kg, SpO2 100 %. Body mass index is 23.43 kg/m.   Treatment Plan Summary: Daily contact with patient to assess and evaluate symptoms and progress in treatment and Medication management   No new labs today No medication changes today.   Depression: -Continue Remeron 15 mg PO at bedtime -Initiate Zyprexa 5 mg PO at bedtime   Insomnia:  -Continue Trazodone 50 mg PO at bedtime PRN  Anxiety: -Continue Vistaril 25 mg PO TID PRN  Discontinue COWS protocol for opiate withdrawal. Patient has not required PRN medications, last score was 0.    Laveda Abbe, NP 12/03/2020, 11:47 AM

## 2020-12-03 NOTE — BHH Counselor (Signed)
CSW spoke with pt about [employer] and [facility] to see if he had made a decision. Pt was concerned because he stated "both are work programs and not drug rehabs". CSW explained that working is apart of both programs to pay for room and board but that both follow a 12 step program structure and provide substance use treatment. Pt stated he would go to the [employer] and then asked for another copy of the application. CSW brought pt another application and he then stated, "I don't know. I will call them." and return to socialize with other patients. CSW reminded pt that the it is possible that he may be discharged today or tomorrow so it is important that he goes ahead and make a decision to prepare for discharge. MHT Caren then shared with CSW that pt wanted to get a phone number out of his phone. CSW reminded MHT Caren that pt could do that at discharge as we do not allow this during admission. Pt became upset, grabbing his items stating he wanted to leave now. He was informed that he needed to first speak with the doctor. Pt stated "I ain't speaking to no fucking doctor.".   Fredirick Lathe, LCSWA Clinicial Social Worker [facility]

## 2020-12-04 DIAGNOSIS — F1994 Other psychoactive substance use, unspecified with psychoactive substance-induced mood disorder: Secondary | ICD-10-CM | POA: Diagnosis not present

## 2020-12-04 DIAGNOSIS — F332 Major depressive disorder, recurrent severe without psychotic features: Secondary | ICD-10-CM | POA: Diagnosis not present

## 2020-12-04 LAB — HCV RNA QUANT
HCV Quantitative Log: 4.838 log10 IU/mL (ref >1.70)
HCV Quantitative: 68900 IU/mL (ref >50)

## 2020-12-04 LAB — HEMOGLOBIN A1C
Hgb A1c MFr Bld: 5.3 % (ref 4.8–5.6)
Mean Plasma Glucose: 105 mg/dL

## 2020-12-04 LAB — T3, FREE: T3, Free: 2.5 pg/mL (ref 2.0–4.4)

## 2020-12-04 MED ORDER — MIRTAZAPINE 15 MG PO TABS: 15.0000 mg | ORAL_TABLET | Freq: Every day | ORAL | 0 refills | Status: DC

## 2020-12-04 MED ORDER — GABAPENTIN 100 MG PO CAPS: 200.0000 mg | ORAL_CAPSULE | Freq: Three times a day (TID) | ORAL | 0 refills | Status: DC

## 2020-12-04 MED ORDER — OLANZAPINE 5 MG PO TBDP: 5.0000 mg | ORAL_TABLET | Freq: Every day | ORAL | 0 refills | Status: DC

## 2020-12-04 NOTE — BHH Suicide Risk Assessment (Signed)
[facility] Discharge Suicide Risk Assessment   Principal Problem: Major depressive disorder, recurrent severe without psychotic features (HCC) Discharge Diagnoses: Principal Problem:   Major depressive disorder, recurrent severe without psychotic features (HCC) Active Problems:   Substance induced mood disorder (HCC)   Total Time spent with patient: 20 minutes  Musculoskeletal: Strength & Muscle Tone: within normal limits Gait & Station: normal Patient leans: N/A  Psychiatric Specialty Exam  Presentation  General Appearance: Fairly Groomed  Eye Contact:Good  Speech:Clear and Coherent; Normal Rate  Speech Volume:Increased  Handedness:Right   Mood and Affect  Mood:Anxious  Duration of Depression Symptoms: Greater than two weeks  Affect:Appropriate   Thought Process  Thought Processes:Coherent  Descriptions of Associations:Circumstantial  Orientation:Full (Time, Place and Person)  Thought Content:Logical  History of Schizophrenia/Schizoaffective disorder:No  Duration of Psychotic Symptoms:No data recorded Hallucinations:Hallucinations: None  Ideas of Reference:None  Suicidal Thoughts:Suicidal Thoughts: No  Homicidal Thoughts:Homicidal Thoughts: No   Sensorium  Memory:Immediate Fair; Recent Fair; Remote Fair  Judgment:Fair  Insight:Fair   Executive Functions  Concentration:Good  Attention Span:Good  Recall:Fair  Fund of Knowledge:Good  Language:Good   Psychomotor Activity  Psychomotor Activity:Psychomotor Activity: Normal   Assets  Assets:Desire for Improvement; Resilience   Sleep  Sleep:Sleep: Good Number of Hours of Sleep: 6.5   Physical Exam: Physical Exam Vitals and nursing note reviewed.  Constitutional:      Appearance: Normal appearance.  HENT:     Head: Normocephalic and atraumatic.  Pulmonary:     Effort: Pulmonary effort is normal.  Neurological:     General: No focal deficit present.     Mental Status: He is alert  and oriented to person, place, and time.    Review of Systems  All other systems reviewed and are negative.  Blood pressure 132/90, pulse 98, temperature (!) 97.4 F (36.3 C), temperature source Oral, resp. rate 16, height 5\' 11"  (1.803 m), weight 76.2 kg, SpO2 99 %. Body mass index is 23.43 kg/m.  Mental Status Per Nursing Assessment::   On Admission:  Suicidal ideation indicated by patient,Suicidal ideation indicated by others,Self-harm thoughts,Intention to act on suicide plan,Belief that plan would result in death  Demographic Factors:  Male, Caucasian, Low socioeconomic status, Living alone and Unemployed  Loss Factors: Decrease in vocational status and Financial problems/change in socioeconomic status  Historical Factors: Impulsivity  Risk Reduction Factors:   Positive coping skills or problem solving skills  Continued Clinical Symptoms:  Depression:   Comorbid alcohol abuse/dependence Impulsivity Alcohol/Substance Abuse/Dependencies  Cognitive Features That Contribute To Risk:  None    Suicide Risk:  Minimal: No identifiable suicidal ideation.  Patients presenting with no risk factors but with morbid ruminations; may be classified as minimal risk based on the severity of the depressive symptoms   Follow-up Information    [facility] Follow up.   Why: Please call and make an appointment for follow up for thyroid and Hep C.  Contact information: [phone] [place] [phone]       [facility] Follow up.   Why: You have been given the application for this facility to complete. You can go there Mon.-Fri. 7am-1pm for intake. Please bring your photo ID and social security card.  Contact information: [address] [place], [place] [phone]       [facility] Follow up.   Why: You can go to this facility at any time to be admitted for substance use treatment. Please  bring  your photo ID. Contact information: [address], [place], [place] [address]       [facility] Follow up.   Specialty: Behavioral Health Why: You have an appointment for medication management and therapy services on 12/09/20 at 7:45am. These appointments will be held in person. You may request outpatient substance use services here.   Contact information: [address] [place] [address] [phone]              Plan Of Care/Follow-up recommendations:  Activity:  ad lib  Antonieta Pert, MD 12/04/2020, 9:27 AM

## 2020-12-04 NOTE — Progress Notes (Signed)
Discharge Note:  Patient discharged home. Patient denied SI and HI. Denied A/V hallucinations.  Suicide prevention information given and discussed with patient who stated he understood and had no questions.  Patient stated he received all his belongings, clothing, toiletries, misc items, etc.  Patient stated he appreciated all assistance received from BHH staff.  All required discharge information given to patient.  

## 2020-12-04 NOTE — BHH Counselor (Signed)
2 [employer] bus passes and 2 [employer] passes placed in chart.   Jared Jordan, LCSWA Clinicial Social Worker [employer]

## 2020-12-04 NOTE — Discharge Summary (Signed)
Physician Discharge Summary Note  Patient:  Jared Jordan is an 30 y.o., male MRN:  [id number] DOB:  21-Aug-1990 Patient phone:  [phone] (home)  Patient address:   [address] [place] [address],  Total Time spent with patient: 30 minutes  Date of Admission:  11/30/2020 Date of Discharge: 12/04/2020  Reason for Admission:  (From MD's admission note): Jared Jordan is a 30 year old male with a past psychiatric history significant for MDD, recurrent severe and polysubstance abuse. He has had multiple inpatient psychiatric admissions and emergency room visits for drug overdose and suicidal ideation. He presented to [facility] with suicidal ideation and a plan to shoot himself with his gun. He stated his friend took the bullets from him. He stated his gun is in his bag at his friends house. He stated he has not been managing his life well since his father died 2 years ago. He stated he and his father ere close and this was a good relationship for him, he feels like he has not been okay since then. He reported he is on "thin ice" with his family, he has drifted away from friends, he quit his job 2 weeks ago and is losing his apartment. He stated at one time he had a scholarship to [employer] and was studying business. He had to drop out of school and lost his scholarship. He has had previous suicide attempts by cutting and jumping from a third story window. He stated he is losing his apartment and has been staying with friend but will not be able to stay there when he leaves the hospital. He stated "my family loves me and cares about me but they don't have the means to support me."  Patient describes depressive symptoms as anhedonia, racing thoughts, difficulty falling and staying asleep, feelings of worthlessness, hopelessness and guilt. He stated he is anxious and worries about everything. He listed protective factors as really wanting to live, supportive family, and a life goal to complete college.  He stated he gets $100 per week from his step-mother, this is money his father left for him. He denies a history of physical, sexual or emotional abuse or trauma.   He denies drug and alcohol use. His UDS is positive for opiates, alcohol level is <10. He stated he took Darvocet to get some sleep. At first he stated he got hurt on the job 2 weeks ago and had a prescription for the pain medicine. I explained there is no recent or current prescription for this medication on PDMP he stated he got it from his job, then he said he got it from someone at work. He denies he is actively using opiates again. He reported to the MD that he thinks he has Hep C due to his history of IV drug use.   Patient is alert & oriented. He denies homicidal ideation, paranoia and delusions. He endorses ongoing suicidal ideation. He appears anxious, sad and depressed. He denies auditory and visual hallucinations. He is abe to contract for safety on the unit and agrees to tell staff if he is having thoughts of harming himself. He is agreeable to start medications.   Evaluation on the unit: Patient is seen and evaluated. Patient is taking his medications and has had no issues with them. He stated his depression and anxiety are better today, he did not rate them. He reported good sleep, record shows he slept 6.5 hours. He reported a fair appetite. Patient is attending group therapy and interacts  appropriately with peers. He is sometimes angry with staff and has unreal expectations of them. He denies SI/HI/AVH, paranoia and delusions. He does not appear to be responding to internal stimuli. He admitted yesterday that he does not have a gun or access to firearms. He stated he said that to get into the hospital.  He has been provided with resources and has failed to make phone calls to check on beds or commit to any program. He has not taken any initiative to help himself. He told CSW that he has a friend he can stay with, however he would  not give permission for [place] to speak with his friend for safety planning. He appears to be trying to sabotage his discharge by placing multiple conditions on where he will go, then constantly changing his mind. Today, patient has stated he wants to go to the [facility]. He was provided with bus passes and his prescriptions.He stated he has money to pay for his medications.  Vital signs are stable; BP 132/90, pulse 98, temp 97.4, O2 99%.  Patient is stable for discharge today.   Principal Problem: Major depressive disorder, recurrent severe without psychotic features [facility]) Discharge Diagnoses: Principal Problem:   Major depressive disorder, recurrent severe without psychotic features (HCC) Active Problems:   Substance induced mood disorder (HCC)   Past Psychiatric History: See H&P  Past Medical History:  Past Medical History:  Diagnosis Date  . Drug use     Past Surgical History:  Procedure Laterality Date  . SHOULDER SURGERY    . SHOULDER SURGERY Left 2020   Family History: History reviewed. No pertinent family history. Family Psychiatric  History: See H&P Social History:  Social History   Substance and Sexual Activity  Alcohol Use Yes  . Alcohol/week: 4.0 - 6.0 standard drinks  . Types: 4 - 6 Cans of beer per week   Comment: 2-3 times weekly     Social History   Substance and Sexual Activity  Drug Use Not Currently  . Types: IV, Methamphetamines   Comment: using 1gm everyday heroin    Social History   Socioeconomic History  . Marital status: Single    Spouse name: Not on file  . Number of children: Not on file  . Years of education: Not on file  . Highest education level: Not on file  Occupational History  . Not on file  Tobacco Use  . Smoking status: Current Every Day Smoker    Packs/day: 0.25    Years: 1.00    Pack years: 0.25  . Smokeless tobacco: Never Used  Vaping Use  . Vaping Use: Some days  . Start date: 11/04/2018  . Devices: Jewel Menthol  Substance and  Sexual Activity  . Alcohol use: Yes    Alcohol/week: 4.0 - 6.0 standard drinks    Types: 4 - 6 Cans of beer per week    Comment: 2-3 times weekly  . Drug use: Not Currently    Types: IV, Methamphetamines    Comment: using 1gm everyday heroin  . Sexual activity: Yes  Other Topics Concern  . Not on file  Social History Narrative  . Not on file   Social Determinants of Health   Financial Resource Strain: Not on file  Food Insecurity: Not on file  Transportation Needs: Not on file  Physical Activity: Not on file  Stress: Not on file  Social Connections: Not on file    Hospital Course:  After the above admission evaluation, Giancarlos presenting symptoms  were noted. He was recommended for mood stabilization treatments. The medication regimen targeting those presenting symptoms were discussed with him & initiated with his consent. He was restarted on Remeron and low dose Zyprexa was added to help his mood. His UDS on arrival to the ED was positive for opiates, BAL was negative. He has a history of opiate/heroin abuse but denied using heroin again. He did not develop any opiate withdrawal symptoms & did not receive any opiate detoxification treatments. He was however medicated, stabilized & discharged on the medications as listed on his discharge medication lists below. Besides the mood stabilization treatments, was also enrolled & participated in the group counseling sessions being offered & held on this unit. He/She learned coping skills. He presented no other significant pre-existing medical issues that required treatment. He tolerated his treatment regimen without any adverse effects or reactions reported.   During the course of his hospitalization, the 15-minute checks were adequate to ensure patient's safety. Denis did not display any dangerous, violent or suicidal behavior on the unit.  He interacted with patients & staff appropriately, participated appropriately in the group  sessions/therapies. His medications were addressed & adjusted to meet his needs. He was recommended for outpatient follow-up care & medication management upon discharge to assure continuity of care & mood stability.  At the time of discharge patient is not reporting any acute suicidal/homicidal ideations. He feels more confident about his self-care & in managing his mental health. He currently denies any new issues or concerns. Education and supportive counseling provided throughout his/her hospital stay & upon discharge.   Today upon his discharge evaluation with the attending psychiatrist, Arty shares he is doing well. He denies any other specific concerns. He is sleeping well. His appetite is good. He denies other physical complaints. He denies AH/VH, delusional thoughts or paranoia. He does not appear to be responding to any internal stimuli. He feels that his medications have been helpful & is in agreement to continue his current treatment regimen as recommended. He was able to engage in safety planning including plan to return to [facility] or contact emergency services if he feels unable to maintain his own safety or the safety of others. Pt had no further questions, comments, or concerns. He left [facility] with all personal belongings in no apparent distress. Transportation per [employer].   Physical Findings: AIMS: Facial and Oral Movements Muscles of Facial Expression: None, normal Lips and Perioral Area: None, normal Jaw: None, normal Tongue: None, normal,Extremity Movements Upper (arms, wrists, hands, fingers): None, normal Lower (legs, knees, ankles, toes): None, normal, Trunk Movements Neck, shoulders, hips: None, normal, Overall Severity Severity of abnormal movements (highest score from questions above): None, normal Incapacitation due to abnormal movements: None, normal Patient's awareness of abnormal movements (rate only patient's report): No Awareness, Dental Status Current  problems with teeth and/or dentures?: No Does patient usually wear dentures?: No  CIWA:  CIWA-Ar Total: 3 COWS:  COWS Total Score: 0  Musculoskeletal: Strength & Muscle Tone: within normal limits Gait & Station: normal Patient leans: N/A   Psychiatric Specialty Exam:  Presentation  General Appearance: Fairly Groomed  Eye Contact:Good  Speech:Clear and Coherent; Normal Rate  Speech Volume:Increased  Handedness:Right  Mood and Affect  Mood:Anxious  Affect:Appropriate  Thought Process  Thought Processes:Coherent  Descriptions of Associations:Circumstantial  Orientation:Full (Time, Place and Person)  Thought Content:Logical  History of Schizophrenia/Schizoaffective disorder:No  Duration of Psychotic Symptoms:No data recorded Hallucinations:Hallucinations: None  Ideas of Reference:None  Suicidal Thoughts:Suicidal Thoughts:  No  Homicidal Thoughts:Homicidal Thoughts: No  Sensorium  Memory:Immediate Fair; Recent Fair; Remote Fair  Judgment:Fair  Insight:Fair  Executive Functions  Concentration:Good  Attention Span:Good  Recall:Fair  Fund of Knowledge:Good  Language:Good  Psychomotor Activity  Psychomotor Activity:Psychomotor Activity: Normal  Assets  Assets:Desire for Improvement; Resilience  Sleep  Sleep:Sleep: Good Number of Hours of Sleep: 6.5  Physical Exam: Physical Exam Vitals and nursing note reviewed.  Constitutional:      Appearance: Normal appearance.  HENT:     Head: Normocephalic.  Pulmonary:     Effort: Pulmonary effort is normal.  Musculoskeletal:        General: Normal range of motion.     Cervical back: Normal range of motion.  Neurological:     General: No focal deficit present.     Mental Status: He is alert and oriented to person, place, and time.  Psychiatric:        Attention and Perception: He does not perceive auditory or visual hallucinations.        Mood and Affect: Mood normal.        Speech: Speech  normal.        Behavior: Behavior normal. Behavior is cooperative.        Thought Content: Thought content is not paranoid or delusional. Thought content does not include homicidal or suicidal ideation. Thought content does not include homicidal or suicidal plan.    Review of Systems  Constitutional: Negative for fever.  HENT: Negative for congestion, sinus pain and sore throat.   Respiratory: Negative for cough and shortness of breath.   Cardiovascular: Negative for chest pain.  Gastrointestinal: Negative.   Genitourinary: Negative.   Musculoskeletal: Negative.   Neurological: Negative.    Blood pressure 132/90, pulse 98, temperature (!) 97.4 F (36.3 C), temperature source Oral, resp. rate 16, height 5\' 11"  (1.803 m), weight 76.2 kg, SpO2 99 %. Body mass index is 23.43 kg/m.   Have you used any form of tobacco in the last 30 days? (Cigarettes, Smokeless Tobacco, Cigars, and/or Pipes): Yes  Has this patient used any form of tobacco in the last 30 days? (Cigarettes, Smokeless Tobacco, Cigars, and/or Pipes) Yes, Yes, A prescription for an FDA-approved tobacco cessation medication was offered at discharge and the patient refused  Blood Alcohol level:  Lab Results  Component Value Date   [facility] <10 11/30/2020   ETH <10 11/21/2019    Metabolic Disorder Labs:  Lab Results  Component Value Date   HGBA1C 5.3 12/03/2020   MPG 105 12/03/2020   MPG 108.28 11/23/2019   No results found for: PROLACTIN Lab Results  Component Value Date   CHOL 174 12/03/2020   TRIG 84 12/03/2020   HDL 61 12/03/2020   CHOLHDL 2.9 12/03/2020   VLDL 17 12/03/2020   LDLCALC 96 12/03/2020   LDLCALC 116 (H) 11/23/2019    See Psychiatric Specialty Exam and Suicide Risk Assessment completed by Attending Physician prior to discharge.  Discharge destination:  Other:  IRC  Is patient on multiple antipsychotic therapies at discharge:  No   Has Patient had three or more failed trials of antipsychotic  monotherapy by history:  No  Recommended Plan for Multiple Antipsychotic Therapies: NA   Allergies as of 12/04/2020   Not on File     Medication List    STOP taking these medications   ibuprofen 400 MG tablet Commonly known as: ADVIL   traZODone 50 MG tablet Commonly known as: DESYREL   venlafaxine XR 75 MG  24 hr capsule Commonly known as: EFFEXOR-XR     TAKE these medications     Indication  gabapentin 100 MG capsule Commonly known as: NEURONTIN Take 2 capsules (200 mg total) by mouth 3 (three) times daily. What changed:   medication strength  how much to take  Indication: mood stability   mirtazapine 15 MG tablet Commonly known as: REMERON Take 1 tablet (15 mg total) by mouth at bedtime.  Indication: Major Depressive Disorder   OLANZapine zydis 5 MG disintegrating tablet Commonly known as: ZYPREXA Take 1 tablet (5 mg total) by mouth at bedtime.  Indication: Major Depressive Disorder       Follow-up Information    [facility] Follow up.   Why: Please call and make an appointment for follow up for thyroid and Hep C.  Contact information: [address] [place] [id number] [phone]       [facility] Follow up.   Why: You have been given the application for this facility to complete. You can go there Mon.-Fri. 7am-1pm for intake. Please bring your photo ID and social security card.  Contact information: [address] [place], [place] [id number] [phone]       [facility] Follow up.   Why: You can go to this facility at any time to be admitted for substance use treatment. Please bring your photo ID. Contact information: [address] [place], [place] [id number]       [facility] Follow [facility].   Specialty: Behavioral Health Why: You have an appointment for medication management and therapy services on 12/09/20 at 7:45am. These appointments will be held in  person. You may request outpatient substance use services here.   Contact information: [address] [phone]              Follow-up recommendations:  Activity:  as tolerated Diet:  heart healthy  Comments:  Prescriptions given at discharge.  Patient agreeable to plan.  Given opportunity to ask questions.  Appears to feel comfortable with discharge denies any current suicidal or homicidal thoughts.   Patient is instructed prior to discharge to: Take all medications as prescribed by his mental healthcare provider. Report any adverse effects and or reactions from the medicines to his outpatient provider promptly. Patient has been instructed & cautioned: To not engage in alcohol and or illegal drug use while on prescription medicines. In the event of worsening symptoms, patient is instructed to call the crisis hotline, 911 and or go to the nearest ED for appropriate evaluation and treatment of symptoms. To follow-up with his primary care provider for your other medical issues, concerns and or health care needs.   Signed: Laveda AbbeLaurie Britton Velmer Woelfel, NP 12/04/2020, 1:20 PM

## 2020-12-04 NOTE — BHH Counselor (Signed)
CSW met with pt to discuss his discharge plans. Pt still refused to sign consent for his "friend" that he states he can stay with when his discharges. Pt stated he does not want to go to [facility] or [facility] because "they are not for me". Pt stated that he was interested in [facility] in [place]. CSW informed patient once again that he needs to call and check if they have any open beds and that he would not be able to go straight there as there is an application process. CSW offered for pt to go to the [facility] via [facility] and he declined. At the time of this note, pt has still not called [facility] and did not yesterday either.   Toney Reil, [place] Worker [employer]

## 2020-12-04 NOTE — BHH Group Notes (Signed)
The focus of this group is to help patients establish daily goals to achieve during treatment and discuss how the patient can incorporate goal setting into their daily lives to aide in recovery.  Pt attended and particpated in group

## 2020-12-04 NOTE — BHH Counselor (Signed)
Pt approached CSW and stated that he would like to apologize for his outburst earlier this morning. Pt stated that he is just trying to make the right choices because he has made so many wrong ones in the past. Pt stated that he is interested in [facility] for rehab due to not having to work. CSW encouraged pt to go ahead and call to see if there are open beds as well as see if there is an application or interview process. Pt also stated that he could stay with "a friend" until a bed opened up if needed. CSW informed pt that he would need to sign a consent so that CSW could verify that this is a safe place and that there are no weapons in the friend's home. Pt declined to sign consent for the friend. Pt stated, "You wanna know a secret? There really never was a gun.". CSW thanked him for his honesty but explained that she still needed to verify this with the friend before he can go stay there at discharge. Pt stated he understood and returned to the dayroom.   Fredirick Lathe, LCSWA Clinicial Social Worker [employer]

## 2020-12-04 NOTE — Progress Notes (Signed)
  [facility] Adult Case Management Discharge Plan :  Will you be returning to the same living situation after discharge:  No. Was discharged with [facility] At discharge, do you have transportation home?: No. Given Bus Passes Do you have the ability to pay for your medications: Yes,  has income  Release of information consent forms completed and in the chart;  Patient's signature needed at discharge.  Patient to Follow up at:  Follow-up Information    [facility] Follow up.   Why: Please call and make an appointment for follow up for thyroid and Hep C.  Contact information: [address] [place] [address] [phone]       [facility] Follow up.   Why: You have been given the application for this facility to complete. You can go there Mon.-Fri. 7am-1pm for intake. Please bring your photo ID and social security card.  Contact information: [address] [place], [place] [address] [phone]       [facility] Follow up.   Why: You can go to this facility at any time to be admitted for substance use treatment. Please bring your photo ID. Contact information: [address], [place], [place] [address]       [facility] Follow up.   Specialty: Behavioral Health Why: You have an appointment for medication management and therapy services on 12/09/20 at 7:45am. These appointments will be held in person. You may request outpatient substance use services here.   Contact information: [address] [place] [address] [phone]              Next level of care provider has access to [facility] Link:yes  Safety Planning and Suicide Prevention discussed: Yes,  w/ pt  Have you used any form of tobacco in the last 30 days? (Cigarettes, Smokeless Tobacco, Cigars, and/or Pipes): Yes  Has patient been referred to the Quitline?: Patient refused referral  Patient has been  referred for addiction treatment: Yes, but pt declined options given due to being work programs  Felizardo Hoffmann, LCSWA 12/04/2020, 9:27 AM

## 2020-12-05 LAB — HCV RT-PCR, QUANT (NON-GRAPH)
HCV log10: 4.441 log10 IU/mL
Hepatitis C Quantitation: 27600 IU/mL

## 2020-12-06 LAB — HEPATITIS PANEL, ACUTE
HCV Ab: REACTIVE — AB
Hep B C IgM: NONREACTIVE
Hepatitis B Surface Ag: NONREACTIVE

## 2020-12-30 NOTE — Plan of Care (Signed)
I have attempted to call patient on 12/30/20 to notify him on his positive Hep C viral load and to encourage outpatient GI f/u for Hep C management and cannot get an answer. Will ask staff to continue to try to reach patient.   Jared Crews, MD, [facility]

## 2021-01-09 NOTE — Plan of Care (Signed)
In my presence, Nursing [facility] left a HIPPA appropriate VM on the emergency phone contact number provided in patient's chart ([phone]) on 01/09/21 asking patient to call back at [phone] to discuss his lab results. Nursing [facility] then also contacted [facility] on 01/09/21 requesting that if the patient is at their facility that he be asked to contact us to discuss his lab results.   Bartholomew Crews, MD, [facility]

## 2021-01-27 ENCOUNTER — Emergency Department (HOSPITAL_COMMUNITY): Payer: Self-pay

## 2021-01-27 ENCOUNTER — Other Ambulatory Visit: Payer: Self-pay

## 2021-01-27 ENCOUNTER — Emergency Department (HOSPITAL_COMMUNITY)
Admission: EM | Admit: 2021-01-27 | Discharge: 2021-01-27 | Disposition: A | Discharge status: Home / Self Care | Payer: Self-pay | Attending: Emergency Medicine | Admitting: Emergency Medicine

## 2021-01-27 DIAGNOSIS — Y9301 Activity, walking, marching and hiking: Secondary | ICD-10-CM | POA: Insufficient documentation

## 2021-01-27 DIAGNOSIS — S90821A Blister (nonthermal), right foot, initial encounter: Secondary | ICD-10-CM | POA: Insufficient documentation

## 2021-01-27 DIAGNOSIS — X509XXA Other and unspecified overexertion or strenuous movements or postures, initial encounter: Secondary | ICD-10-CM | POA: Insufficient documentation

## 2021-01-27 DIAGNOSIS — S90822A Blister (nonthermal), left foot, initial encounter: Secondary | ICD-10-CM | POA: Insufficient documentation

## 2021-01-27 DIAGNOSIS — M79671 Pain in right foot: Secondary | ICD-10-CM

## 2021-01-27 DIAGNOSIS — F17210 Nicotine dependence, cigarettes, uncomplicated: Secondary | ICD-10-CM | POA: Insufficient documentation

## 2021-01-27 NOTE — ED Triage Notes (Signed)
Pt BIB [facility] due to bystandard calling because pt was asleep on the sidewalk. Pt has c/o of bilateral foot pain and blisters due to starting a new job and doing a lot of walking. Pt said his been using "street drugs to help the pain." Pt endorses using heroin today. Pt is A&Ox4.

## 2021-01-27 NOTE — ED Notes (Signed)
Patient requesting food/drink. RN asked Mia, PA, who said to wait until scans are read and then patient can have them.

## 2021-01-27 NOTE — ED Provider Notes (Signed)
[facility]-EMERGENCY DEPT Provider Note   CSN: [id number] Arrival date & time: 01/27/21  0013     History Chief Complaint  Patient presents with   Foot Pain    Jared Jordan is a 30 y.o. male with a history of major depressive disorder, methamphetamine use disorder, opioid use disorder who presents to the emergency department by EMS with a chief complaint of bilateral foot pain.  EMS reports that bystanders called 911 after they found the patient asleep on the sidewalk.  Patient reported to EMS that he has been working long hours, walking long course, and has been having worsening foot pain.  The patient reports that he started a job working in [employer] 1 month ago.  He purchased a new pair of shoes when he began his job and reports gradually worsening pain to the bilateral feet over the last month.  He reports that he had been clean from using opioids after going to [facility], but due to the worsening pain relapsed over the last week.  He denies IV drug use or snorting.  He reports that earlier tonight that it took him 4 to 5 hours to walk a very short distance due to the pain in his bilateral feet.  Pain is improved with nonweightbearing.  Worse with bearing weight.  No other known aggravating or alleviating factors.  He denies fever, chills, numbness, weakness.  No other treatment prior to arrival.  The history is provided by the patient and medical records. No language interpreter was used.      Past Medical History:  Diagnosis Date   Drug use     Patient Active Problem List   Diagnosis Date Noted   MDD (major depressive disorder), recurrent episode, severe (HCC) 11/30/2020   Major depressive disorder, recurrent severe without psychotic features (HCC)    Substance induced mood disorder (HCC) 11/22/2019    Past Surgical History:  Procedure Laterality Date   SHOULDER SURGERY     SHOULDER SURGERY Left 2020       No family history on  file.  Social History   Tobacco Use   Smoking status: Every Day    Packs/day: 0.25    Years: 1.00    Pack years: 0.25    Types: Cigarettes   Smokeless tobacco: Never  Vaping Use   Vaping Use: Some days   Start date: 11/04/2018   Devices: Jewel Menthol  Substance Use Topics   Alcohol use: Yes    Alcohol/week: 4.0 - 6.0 standard drinks    Types: 4 - 6 Cans of beer per week    Comment: 2-3 times weekly   Drug use: Not Currently    Types: IV, Methamphetamines    Comment: using 1gm everyday heroin    Home Medications Prior to Admission medications   Not on File    Allergies    Patient has no known allergies.  Review of Systems   Review of Systems  Constitutional:  Negative for appetite change, chills and fever.  Respiratory:  Negative for shortness of breath.   Cardiovascular:  Negative for chest pain.  Gastrointestinal:  Negative for abdominal pain, diarrhea, nausea and vomiting.  Genitourinary:  Negative for dysuria.  Musculoskeletal:  Positive for arthralgias, gait problem and myalgias. Negative for back pain, joint swelling, neck pain and neck stiffness.  Skin:  Positive for color change and wound. Negative for rash.  Allergic/Immunologic: Negative for immunocompromised state.  Neurological:  Negative for seizures, syncope, weakness, numbness and headaches.  Psychiatric/Behavioral:  Negative for confusion.    Physical Exam Updated Vital Signs BP 113/77   Pulse 70   Temp 98.2 F (36.8 C) (Oral)   Resp 18   Ht 5\' 10"  (1.778 m)   Wt 74.8 kg   SpO2 98%   BMI 23.68 kg/m   Physical Exam Vitals and nursing note reviewed.  Constitutional:      Appearance: He is well-developed.  HENT:     Head: Normocephalic.  Eyes:     Conjunctiva/sclera: Conjunctivae normal.  Cardiovascular:     Rate and Rhythm: Normal rate and regular rhythm.     Heart sounds: No murmur heard. Pulmonary:     Effort: Pulmonary effort is normal.  Abdominal:     General: There is no  distension.     Palpations: Abdomen is soft.  Musculoskeletal:     Cervical back: Neck supple.     Comments: Please see photos below.  Left medial foot with a large bulla.  There is macerated skin between the fourth and the fifth digit on the left.  No erythema, edema, or warmth.  No red streaking.    Right foot with macerated skin noted to the sole of the foot.  There is a bulla noted to the lateral aspect of the second digit.  There is skin ulceration created near the medial heel of the foot.  No purulent drainage.  There appears to be developing granuloma tissue formation under the ulceration.  Neurovascularly intact to the bilateral lower extremities.  Skin:    General: Skin is warm and dry.  Neurological:     Mental Status: He is alert.  Psychiatric:        Behavior: Behavior normal.            ED Results / Procedures / Treatments   Labs (all labs ordered are listed, but only abnormal results are displayed) Labs Reviewed - No data to display  EKG None  Radiology DG Foot Complete Left  Result Date: 01/27/2021 CLINICAL DATA:  Left foot pain and wounds. EXAM: LEFT FOOT - COMPLETE 3+ VIEW COMPARISON:  None. FINDINGS: There is no evidence of fracture or dislocation. There is no evidence of arthropathy or other focal bone abnormality. Soft tissues are unremarkable. IMPRESSION: Negative. Electronically Signed   By: 01/29/2021 M.D.   On: 01/27/2021 07:29   DG Foot Complete Right  Result Date: 01/27/2021 CLINICAL DATA:  Foot pain and wounds. EXAM: RIGHT FOOT COMPLETE - 3+ VIEW COMPARISON:  None. FINDINGS: There is no evidence of fracture or dislocation. There is no evidence of arthropathy or other focal bone abnormality. Soft tissues are unremarkable. IMPRESSION: Negative. Electronically Signed   By: 01/29/2021 M.D.   On: 01/27/2021 07:30    Procedures Procedures   Medications Ordered in ED Medications - No data to display  ED Course  I have reviewed the triage  vital signs and the nursing notes.  Pertinent labs & imaging results that were available during my care of the patient were reviewed by me and considered in my medical decision making (see chart for details).    MDM Rules/Calculators/A&P                           30 year old male with a history of major depressive disorder, methamphetamine use disorder, opioid use disorder who presents emergency department by EMS for bilateral foot pain.  EMS reports that bystanders called 911 after they found the patient asleep  on the sidewalk.  Vital signs stable.  No evidence of acute intoxication.  On exam, he does have multiple wounds noted to the bilateral feet.  Please see photos above.  Imaging has been reviewed and independently interpreted by me.  No evidence of osteomyelitis or bony destruction noted on x-ray.  Wounds were irrigated and dressings applied.  He was given postop shoes to reduce friction blisters.  Patient was counseled on home care.  He will be given a referral to podiatry.  No indication for antibiotics at this time.  OTC analgesia recommended for continued pain.  At this time, he is hemodynamically stable and in no acute distress.  Safe for discharge home with outpatient follow-up to podiatry and primary care.   Final Clinical Impression(s) / ED Diagnoses Final diagnoses:  Bilateral foot pain  Blister of left foot, initial encounter  Blister of right foot, initial encounter    Rx / DC Orders ED Discharge Orders     None        Barkley Boards, PA-C 01/27/21 0836    Molpus, Jonny Ruiz, MD 01/28/21 1636

## 2021-01-27 NOTE — Discharge Instructions (Addendum)
Clean theThank you for allowing me to care for you today in the Emergency Department.   Keep the feet clean and dry.  Clean them daily with warm water and soap.  Apply a gauze dressing to any areas with blisters.  You can apply a topical antibiotic, such as Neosporin to the areas that he did not have blisters and then apply an overlying dressing.  Most likely these are wounds from pressure from your shoes.  Make sure that you are wearing thick socks with your work shoes.   You can follow-up with podiatry for recheck.  Return to the emergency department if you develop red streaking going up your legs, thick, mucus-like drainage from the feet, severe swelling, weakness, or other new, concerning symptoms.

## 2021-01-27 NOTE — ED Notes (Signed)
Wounds cleaned and dressed.

## 2021-02-08 ENCOUNTER — Other Ambulatory Visit: Payer: Self-pay

## 2021-02-08 ENCOUNTER — Other Ambulatory Visit (HOSPITAL_COMMUNITY)
Admission: EM | Admit: 2021-02-08 | Discharge: 2021-02-10 | Disposition: A | Discharge status: Home / Self Care | Payer: Federal, State, Local not specified - Other | Attending: Psychiatry | Admitting: Psychiatry

## 2021-02-08 DIAGNOSIS — F332 Major depressive disorder, recurrent severe without psychotic features: Secondary | ICD-10-CM | POA: Diagnosis present

## 2021-02-08 DIAGNOSIS — Z634 Disappearance and death of family member: Secondary | ICD-10-CM | POA: Insufficient documentation

## 2021-02-08 DIAGNOSIS — F17210 Nicotine dependence, cigarettes, uncomplicated: Secondary | ICD-10-CM | POA: Insufficient documentation

## 2021-02-08 DIAGNOSIS — R21 Rash and other nonspecific skin eruption: Secondary | ICD-10-CM | POA: Insufficient documentation

## 2021-02-08 DIAGNOSIS — Z20822 Contact with and (suspected) exposure to covid-19: Secondary | ICD-10-CM | POA: Insufficient documentation

## 2021-02-08 DIAGNOSIS — R45851 Suicidal ideations: Secondary | ICD-10-CM | POA: Insufficient documentation

## 2021-02-08 LAB — POC SARS CORONAVIRUS 2 AG
SARSCOV2ONAVIRUS 2 AG: NEGATIVE
SARSCOV2ONAVIRUS 2 AG: NEGATIVE

## 2021-02-08 LAB — CBC WITH DIFFERENTIAL/PLATELET
Abs Immature Granulocytes: 0.05 10*3/uL (ref 0.00–0.07)
Basophils Absolute: 0 10*3/uL (ref 0.0–0.1)
Basophils Relative: 0 %
Eosinophils Absolute: 0.2 10*3/uL (ref 0.0–0.5)
Eosinophils Relative: 3 %
HCT: 44.9 % (ref 39.0–52.0)
Hemoglobin: 14.9 g/dL (ref 13.0–17.0)
Immature Granulocytes: 1 %
Lymphocytes Relative: 20 %
Lymphs Abs: 1.3 10*3/uL (ref 0.7–4.0)
MCH: 28.9 pg (ref 26.0–34.0)
MCHC: 33.2 g/dL (ref 30.0–36.0)
MCV: 87.2 fL (ref 80.0–100.0)
Monocytes Absolute: 0.4 10*3/uL (ref 0.1–1.0)
Monocytes Relative: 7 %
Neutro Abs: 4.5 10*3/uL (ref 1.7–7.7)
Neutrophils Relative %: 69 %
Platelets: 330 10*3/uL (ref 150–400)
RBC: 5.15 MIL/uL (ref 4.22–5.81)
RDW: 13 % (ref 11.5–15.5)
WBC: 6.5 10*3/uL (ref 4.0–10.5)
nRBC: 0 % (ref 0.0–0.2)

## 2021-02-08 LAB — COMPREHENSIVE METABOLIC PANEL
ALT: 19 U/L (ref 0–44)
AST: 19 U/L (ref 15–41)
Albumin: 4 g/dL (ref 3.5–5.0)
Alkaline Phosphatase: 87 U/L (ref 38–126)
Anion gap: 10 (ref 5–15)
BUN: 12 mg/dL (ref 6–20)
CO2: 26 mmol/L (ref 22–32)
Calcium: 9.9 mg/dL (ref 8.9–10.3)
Chloride: 101 mmol/L (ref 98–111)
Creatinine, Ser: 0.83 mg/dL (ref 0.61–1.24)
GFR, Estimated: >60 mL/min (ref >60)
Glucose, Bld: 75 mg/dL (ref 70–99)
Potassium: 4.4 mmol/L (ref 3.5–5.1)
Sodium: 137 mmol/L (ref 135–145)
Total Bilirubin: 0.4 mg/dL (ref 0.3–1.2)
Total Protein: 6.9 g/dL (ref 6.5–8.1)

## 2021-02-08 LAB — POCT URINE DRUG SCREEN - MANUAL ENTRY (I-SCREEN)
POC Amphetamine UR: NOT DETECTED
POC Buprenorphine (BUP): NOT DETECTED
POC Cocaine UR: NOT DETECTED
POC Marijuana UR: NOT DETECTED
POC Methadone UR: NOT DETECTED
POC Methamphetamine UR: NOT DETECTED
POC Morphine: NOT DETECTED
POC Oxazepam (BZO): NOT DETECTED
POC Oxycodone UR: NOT DETECTED
POC Secobarbital (BAR): NOT DETECTED

## 2021-02-08 LAB — POC SARS CORONAVIRUS 2 AG -  ED: SARS Coronavirus 2 Ag: NEGATIVE

## 2021-02-08 LAB — ETHANOL: Alcohol, Ethyl (B): <10 mg/dL (ref <10)

## 2021-02-08 LAB — RESP PANEL BY RT-PCR (FLU A&B, COVID) ARPGX2
Influenza A by PCR: NEGATIVE
Influenza B by PCR: NEGATIVE
SARS Coronavirus 2 by RT PCR: NEGATIVE

## 2021-02-08 MED ORDER — ALUM & MAG HYDROXIDE-SIMETH 200-200-20 MG/5ML PO SUSP: 30.0000 mL | ORAL | Status: DC | PRN

## 2021-02-08 MED ORDER — MAGNESIUM HYDROXIDE 400 MG/5ML PO SUSP: 30.0000 mL | Freq: Every day | ORAL | Status: DC | PRN

## 2021-02-08 MED ORDER — HYDROXYZINE HCL 25 MG PO TABS: 25.0000 mg | ORAL_TABLET | Freq: Three times a day (TID) | ORAL | Status: DC | PRN

## 2021-02-08 MED ORDER — ACETAMINOPHEN 325 MG PO TABS: 650.0000 mg | ORAL_TABLET | Freq: Four times a day (QID) | ORAL | Status: DC | PRN

## 2021-02-08 MED ORDER — VENLAFAXINE HCL ER 37.5 MG PO CP24
37.5000 mg | ORAL_CAPSULE | Freq: Every day | ORAL | Status: DC
  Administered 2021-02-08 – 2021-02-10 (×3): 37.5 mg via ORAL
  Filled 2021-02-08 (×3): qty 1

## 2021-02-08 MED ORDER — TRAZODONE HCL 50 MG PO TABS
50.0000 mg | ORAL_TABLET | Freq: Every evening | ORAL | Status: DC | PRN
  Administered 2021-02-09: 50 mg via ORAL
  Filled 2021-02-08: qty 1

## 2021-02-08 MED ORDER — GABAPENTIN 100 MG PO CAPS
100.0000 mg | ORAL_CAPSULE | Freq: Three times a day (TID) | ORAL | Status: DC
  Administered 2021-02-08 – 2021-02-10 (×5): 100 mg via ORAL
  Filled 2021-02-08 (×6): qty 1

## 2021-02-08 NOTE — ED Provider Notes (Addendum)
Behavioral Health Admission H&P [facility] & OBS)  Date: 02/08/21 Patient Name: Jared Jordan MRN: [id number] Chief Complaint: No chief complaint on file.     Diagnoses:  Final diagnoses:  MDD (major depressive disorder), recurrent severe, without psychosis (HCC)    HPI: Jared Jordan is a 30 y/o male with psychiatric history of depression, suicidal attempt, and opiate use disorder. Patient present to [facility] voluntarily via EMS for evaluation of worsening depression and suicidal ideation. Patient reported that he contacted EMS at the encouragement of his mom to seek mental health assistance.   He says he is depressed and struggling to cope with the death of his father (died 24yr ago) as well as life in general. He reports that he feels "emotionally exhausted" and has been neglecting his personal hygiene (no shower in the past 4-5 days), not eating, feeling irritable, isolating, worthless, hopeless, sleeping 2 hours/night, and feeling restless.   He reports he was suicidal tonight with a plan to jump off a parking garage building in [place]. He reports he sent his mother a text to "say goodbye and to let her know she is a good mom" and she talked him out of going through with his plan to commit suicide.    Patient seen face to face and chart review by this NP.  He was admitted inpatient at [facility] from 11/2820-12/04/20. Patient was also evaluated at [facility] on 12/04/20 and 12/19/20 for similar complaint.  On approach patient is alert, orient x4, calm and cooperative. His speech is clear and coherent, he is speaking at normal rate and tone, and  maintained fair eye contact. His thought process is coherent; his mood is depressed with congruent affect. There is no indication that he is currently responding to internal/external stimuli or experiencing delusional thought content. He is noted with generalized rash to his arms and legs as well as what appears  to be track marks. He reports rash is from mosquito bites. He reports that rash is itchy at times. He adamantly denied iv drug use as well as all other substance abuse. He denies all medical complaint.   He is endorsing ongoing suicidal ideation. He denies HI/AVH/paranoia. 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    PHQ 2-9:  Flowsheet Row ED from 11/30/2020 in [facility] EMERGENCY DEPARTMENT  Thoughts that you would be better off dead, or of hurting yourself in some way Nearly every day  PHQ-9 Total Score 26       Flowsheet Row ED from 02/08/2021 in [facility] ED from 01/27/2021 in [facility] Ad[facility] (Dis[facility]  C-SSRS RISK CATEGORY High Risk No Risk High Risk        Total Time spent with patient: 20 minutes  Musculoskeletal  Strength & Muscle Tone: within normal limits Gait & Station: normal Patient leans: Right  Psychiatric Specialty Exam  Presentation General Appearance: Appropriate for Environment  Eye Contact:Good  Speech:Clear and Coherent  Speech Volume:Normal  Handedness:Right   Mood and Affect  Mood:Depressed  Affect:Congruent   Thought Process  Thought Processes:Goal Directed  Descriptions of Associations:Intact  Orientation:Full (Time, Place and Person)  Thought Content:WDL  Diagnosis of Schizophrenia or Schizoaffective disorder in past: No   Hallucinations:Hallucinations: None  Ideas of Reference:None  Suicidal Thoughts:Suicidal Thoughts: Yes, Active (jumping off parking garage building in [place] [employer])  SI Active Intent and/or Plan: With Plan  Homicidal Thoughts:Homicidal Thoughts: No   Sensorium  Memory:Immediate Good; Recent Good; Remote Good  Judgment:Fair  Insight:Good   Executive Functions  Concentration:Fair  Attention  Span:Fair  Recall:Fair  Fund of Knowledge:Good  Language:Good   Psychomotor Activity  Psychomotor Activity:Psychomotor Activity: Normal   Assets  Assets:Desire for Improvement; Manufacturing systems engineer; Social Support   Sleep  Sleep:Sleep: Poor Number of Hours of Sleep: 2   No data recorded  Physical Exam Vitals and nursing note reviewed.  Constitutional:      Appearance: He is well-developed.  HENT:     Head: Normocephalic and atraumatic.  Eyes:     Conjunctiva/sclera: Conjunctivae normal.  Cardiovascular:     Rate and Rhythm: Normal rate.     Heart sounds: No murmur heard. Pulmonary:     Effort: Pulmonary effort is normal. No respiratory distress.     Breath sounds: Normal breath sounds.  Abdominal:     Palpations: Abdomen is soft.     Tenderness: There is no abdominal tenderness.  Musculoskeletal:        General: Normal range of motion.     Cervical back: Neck supple.  Skin:    General: Skin is warm and dry.  Neurological:     Mental Status: He is alert and oriented to person, place, and time.  Psychiatric:        Attention and Perception: Attention normal. He does not perceive auditory or visual hallucinations.        Mood and Affect: Mood is anxious and depressed.        Speech: Speech normal.        Behavior: Behavior normal. Behavior is cooperative.        Thought Content: Thought content is not paranoid or delusional. Thought content includes suicidal ideation. Thought content does not include homicidal ideation. Thought content includes suicidal plan. Thought content does not include homicidal plan.        Cognition and Memory: Cognition normal.   Review of Systems  Constitutional: Negative.   HENT: Negative.    Eyes: Negative.   Respiratory: Negative.    Cardiovascular: Negative.   Gastrointestinal: Negative.   Genitourinary: Negative.   Musculoskeletal: Negative.   Skin: Negative.   Neurological: Negative.   Endo/Heme/Allergies: Negative.    Psychiatric/Behavioral:  Positive for depression and suicidal ideas. Negative for hallucinations. The patient is nervous/anxious and has insomnia.    Blood pressure 140/89, pulse 82, temperature (!) 97.4 F (36.3 C), temperature source Oral, resp. rate 18, SpO2 98 %. There is no height or weight on file to calculate BMI.  Past Psychiatric History:    Is the patient at risk to self? Yes  Has the patient been a risk to self in the past 6 months? Yes .    Has the patient been a risk to self within the distant past? Yes   Is the patient a risk to others? No   Has the patient been a risk to others in the past 6 months? No   Has the patient been a risk to others within the distant past? No   Past Medical History:  Past Medical History:  Diagnosis Date   Drug use     Past Surgical History:  Procedure Laterality Date   SHOULDER SURGERY     SHOULDER SURGERY Left 2020    Family History: No family history on file.  Social History:  Social History   Socioeconomic History   Marital status:  Single    Spouse name: Not on file   Number of children: Not on file   Years of education: Not on file   Highest education level: Not on file  Occupational History   Not on file  Tobacco Use   Smoking status: Every Day    Packs/day: 0.25    Years: 1.00    Pack years: 0.25    Types: Cigarettes   Smokeless tobacco: Never  Vaping Use   Vaping Use: Some days   Start date: 11/04/2018   Devices: Jewel Menthol  Substance and Sexual Activity   Alcohol use: Yes    Alcohol/week: 4.0 - 6.0 standard drinks    Types: 4 - 6 Cans of beer per week    Comment: 2-3 times weekly   Drug use: Not Currently    Types: IV, Methamphetamines    Comment: using 1gm everyday heroin   Sexual activity: Yes  Other Topics Concern   Not on file  Social History Narrative   Not on file   Social Determinants of Health   Financial Resource Strain: Not on file  Food Insecurity: Not on file  Transportation Needs: Not  on file  Physical Activity: Not on file  Stress: Not on file  Social Connections: Not on file  Intimate Partner Violence: Not on file    SDOH:  SDOH Screenings   Alcohol Screen: Low Risk    Last Alcohol Screening Score (AUDIT): 7  Depression (PHQ2-9): Medium Risk   PHQ-2 Score: 26  Financial Resource Strain: Not on file  Food Insecurity: Not on file  Housing: Not on file  Physical Activity: Not on file  Social Connections: Not on file  Stress: Not on file  Tobacco Use: High Risk   Smoking Tobacco Use: Every Day   Smokeless Tobacco Use: Never  Transportation Needs: Not on file    Last Labs:  Admission on 11/30/2020, Discharged on 12/04/2020  Component Date Value Ref Range Status   Hepatitis B Surface Ag 12/01/2020 NON REACTIVE  NON REACTIVE Final   HCV Ab 12/01/2020 Reactive (A) NON REACTIVE Final   Comment: (NOTE) The CDC recommends that a Reactive HCV antibody result be followed up  with a HCV Nucleic Acid Amplification test.     Hep A IgM 12/01/2020 See Scanned report in [facility] (A) NON REACTIVE Final   Performed at [facility]   Hep B C IgM 12/01/2020 NON REACTIVE  NON REACTIVE Final   Performed at [facility], [address], [place], [place] [address]   Sodium 12/01/2020 137  135 - 145 mmol/L Final   Potassium 12/01/2020 4.8  3.5 - 5.1 mmol/L Final   Chloride 12/01/2020 102  98 - 111 mmol/L Final   CO2 12/01/2020 25  22 - 32 mmol/L Final   Glucose, Bld 12/01/2020 132 (A) 70 - 99 mg/dL Final   Glucose reference range applies only to samples taken after fasting for at least 8 hours.   BUN 12/01/2020 15  6 - 20 mg/dL Final   Creatinine, Ser 12/01/2020 0.80  0.61 - 1.24 mg/dL Final   Calcium 60/45/4098 9.7  8.9 - 10.3 mg/dL Final   Total Protein 11/91/4782 7.9  6.5 - 8.1 g/dL Final   Albumin 95/62/1308 4.5  3.5 - 5.0 g/dL Final   AST 65/78/4696 59 (A) 15 - 41 U/L Final   ALT 12/01/2020 93 (A) 0 - 44 U/L Final   Alkaline Phosphatase  12/01/2020 77  38 - 126  U/L Final   Total Bilirubin 12/01/2020 0.6  0.3 - 1.2 mg/dL Final   GFR, Estimated 12/01/2020 >60  >60 mL/min Final   Comment: (NOTE) Calculated using the CKD-EPI Creatinine Equation (2021)    Anion gap 12/01/2020 10  5 - 15 Final   Performed at [facility], [address], [place], [place] [address]   TSH 12/01/2020 0.276 (A) 0.350 - 4.500 uIU/mL Final   Comment: Performed by a 3rd Generation assay with a functional sensitivity of <=0.01 uIU/mL. Performed at [facility], [address] [place], [place] [address]    Free T4 12/03/2020 0.76  0.61 - 1.12 ng/dL Final   Comment: (NOTE) Biotin ingestion may interfere with free T4 tests. If the results are inconsistent with the TSH level, previous test results, or the clinical presentation, then consider biotin interference. If needed, order repeat testing after stopping biotin. Performed at [facility], [address] [place], [place] [address]    T3, Free 12/03/2020 2.5  2.0 - 4.4 pg/mL Final   Comment: (NOTE) Performed At: [facility] [address] [place], [place] [id number] Jolene Schimke MD IO:[id number]    Cholesterol 12/03/2020 174  0 - 200 mg/dL Final   Triglycerides 84/53/6468 84  <150 mg/dL Final   HDL 09/24/2246 61  >40 mg/dL Final   Total CHOL/HDL Ratio 12/03/2020 2.9  RATIO Final   VLDL 12/03/2020 17  0 - 40 mg/dL Final   LDL Cholesterol 12/03/2020 96  0 - 99 mg/dL Final   Comment:        Total Cholesterol/HDL:CHD Risk Coronary Heart Disease Risk Table                     Men   Women  1/2 Average Risk   3.4   3.3  Average Risk       5.0   4.4  2 X Average Risk   9.6   7.1  3 X Average Risk  23.4   11.0        Use the calculated Patient Ratio above and the CHD Risk Table to determine the patient's CHD Risk.        ATP III CLASSIFICATION (LDL):  <100     mg/dL   Optimal  250-037  mg/dL   Near or Above                    Optimal   130-159  mg/dL   Borderline  048-889  mg/dL   High  >169     mg/dL   Very High Performed at [facility], [address], [place], [place] [address]    HCV Quantitative 12/03/2020 68,900  >50 IU/mL Final   HCV Quantitative Log 12/03/2020 4.838  >1.70 log10 IU/mL Final   Test Information 12/03/2020 Comment   Final   Comment: (NOTE) The quantitative range of this assay is 15 IU/mL to 100 million IU/mL. Performed At: [facility] [address] [place], [place] [id number] Jolene Schimke MD ZP:[id number]    Total Protein 12/03/2020 7.1  6.5 - 8.1 g/dL Final   Albumin 80/16/5537 4.3  3.5 - 5.0 g/dL Final   AST 48/27/0786 49 (A) 15 - 41 U/L Final   ALT 12/03/2020 89 (A) 0 - 44 U/L Final   Alkaline Phosphatase 12/03/2020 68  38 - 126 U/L Final   Total Bilirubin 12/03/2020 0.5  0.3 - 1.2 mg/dL Final   Bilirubin, Direct  12/03/2020 <0.1  0.0 - 0.2 mg/dL Final   Indirect Bilirubin 12/03/2020 NOT CALCULATED  0.3 - 0.9 mg/dL Final   Performed at [facility], [address] [place], [place] [address]   Hgb A1c MFr Bld 12/03/2020 5.3  4.8 - 5.6 % Final   Comment: (NOTE)         Prediabetes: 5.7 - 6.4         Diabetes: >6.4         Glycemic control for adults with diabetes: <7.0    Mean Plasma Glucose 12/03/2020 105  mg/dL Final   Comment: (NOTE) Performed At: [facility] [address] [place], [place] [id number] Jolene SchimkeNagendra Sanjai MD XB:[id number]:[phone]    Hepatitis C Quantitation 12/01/2020 27,600  IU/mL Final   HCV log10 12/01/2020 4.441  log10 IU/mL Final   Test Information (HCV): 12/01/2020 Comment   Final   Comment: (NOTE) The quantitative range of this assay is 15 IU/mL to 100 million IU/mL.    Interpretation (HCV): 12/01/2020 Comment   Final   Comment: (NOTE) Positive HCV antibody screen with the presence of HCV RNA is consistent with active infection. Performed At: [facility] [address] [place], [place]  [id number] Jolene SchimkeNagendra Sanjai MD NG:[id number]:[phone]   Admission on 11/30/2020, Discharged on 11/30/2020  Component Date Value Ref Range Status   Sodium 11/30/2020 133 (A) 135 - 145 mmol/L Final   Potassium 11/30/2020 3.6  3.5 - 5.1 mmol/L Final   Chloride 11/30/2020 100  98 - 111 mmol/L Final   CO2 11/30/2020 24  22 - 32 mmol/L Final   Glucose, Bld 11/30/2020 106 (A) 70 - 99 mg/dL Final   Glucose reference range applies only to samples taken after fasting for at least 8 hours.   BUN 11/30/2020 13  6 - 20 mg/dL Final   Creatinine, Ser 11/30/2020 0.94  0.61 - 1.24 mg/dL Final   Calcium 40/10/272505/28/2022 9.4  8.9 - 10.3 mg/dL Final   Total Protein 36/64/403405/28/2022 6.4 (A) 6.5 - 8.1 g/dL Final   Albumin 74/25/956305/28/2022 3.9  3.5 - 5.0 g/dL Final   AST 87/56/433205/28/2022 34  15 - 41 U/L Final   ALT 11/30/2020 58 (A) 0 - 44 U/L Final   Alkaline Phosphatase 11/30/2020 64  38 - 126 U/L Final   Total Bilirubin 11/30/2020 0.5  0.3 - 1.2 mg/dL Final   GFR, Estimated 11/30/2020 >60  >60 mL/min Final   Comment: (NOTE) Calculated using the CKD-EPI Creatinine Equation (2021)    Anion gap 11/30/2020 9  5 - 15 Final   Performed at [facility], [address] [place], [place] [address]   Alcohol, Ethyl (B) 11/30/2020 <10  <10 mg/dL Final   Comment: (NOTE) Lowest detectable limit for serum alcohol is 10 mg/dL.  For medical purposes only. Performed at [facility], [address] [place], [place] [address]    Salicylate Lvl 11/30/2020 <7.0 (A) 7.0 - 30.0 mg/dL Final   Performed at [facility], [address] [place], [place] [address]   Acetaminophen (Tylenol), Serum 11/30/2020 <10 (A) 10 - 30 ug/mL Final   Comment: (NOTE) Therapeutic concentrations vary significantly. A range of 10-30 ug/mL  may be an effective concentration for many patients. However, some  are best treated at concentrations outside of this range. Acetaminophen concentrations >150 ug/mL at 4 hours after ingestion  and >50 ug/mL at 12  hours after ingestion are often associated with  toxic reactions.  Performed at [facility], [address], [place], [place] [address]    WBC 11/30/2020 8.6  4.0 - 10.5 K/uL Final   RBC 11/30/2020 4.26  4.22 - 5.81 MIL/uL Final   Hemoglobin 11/30/2020 12.5 (A) 13.0 - 17.0 g/dL Final   HCT 46/96/2952 37.7 (A) 39.0 - 52.0 % Final   MCV 11/30/2020 88.5  80.0 - 100.0 fL Final   MCH 11/30/2020 29.3  26.0 - 34.0 pg Final   MCHC 11/30/2020 33.2  30.0 - 36.0 g/dL Final   RDW 84/13/2440 13.1  11.5 - 15.5 % Final   Platelets 11/30/2020 220  150 - 400 K/uL Final   nRBC 11/30/2020 0.0  0.0 - 0.2 % Final   Performed at [facility], [address] [place], [place] [address]   Opiates 11/30/2020 POSITIVE (A) NONE DETECTED Final   Cocaine 11/30/2020 NONE DETECTED  NONE DETECTED Final   Benzodiazepines 11/30/2020 NONE DETECTED  NONE DETECTED Final   Amphetamines 11/30/2020 NONE DETECTED  NONE DETECTED Final   Tetrahydrocannabinol 11/30/2020 NONE DETECTED  NONE DETECTED Final   Barbiturates 11/30/2020 NONE DETECTED  NONE DETECTED Final   Comment: (NOTE) DRUG SCREEN FOR MEDICAL PURPOSES ONLY.  IF CONFIRMATION IS NEEDED FOR ANY PURPOSE, NOTIFY LAB WITHIN 5 DAYS.  LOWEST DETECTABLE LIMITS FOR URINE DRUG SCREEN Drug Class                     Cutoff (ng/mL) Amphetamine and metabolites    1000 Barbiturate and metabolites    200 Benzodiazepine                 200 Tricyclics and metabolites     300 Opiates and metabolites        300 Cocaine and metabolites        300 THC                            50 Performed at [facility], [address], [place], [place] [address]    SARS Coronavirus 2 by RT PCR 11/30/2020 NEGATIVE  NEGATIVE Final   Comment: (NOTE) SARS-CoV-2 target nucleic acids are NOT DETECTED.  The SARS-CoV-2 RNA is generally detectable in upper respiratory specimens during the acute phase of infection. The lowest concentration of SARS-CoV-2 viral copies this  assay can detect is 138 copies/mL. A negative result does not preclude SARS-Cov-2 infection and should not be used as the sole basis for treatment or other patient management decisions. A negative result may occur with  improper specimen collection/handling, submission of specimen other than nasopharyngeal swab, presence of viral mutation(s) within the areas targeted by this assay, and inadequate number of viral copies(<138 copies/mL). A negative result must be combined with clinical observations, patient history, and epidemiological information. The expected result is Negative.  Fact Sheet for Patients:  BloggerCourse.com  Fact Sheet for Healthcare Providers:  SeriousBroker.it  This test is no                          t yet approved or cleared by the [place] FDA and  has been authorized for detection and/or diagnosis of SARS-CoV-2 by FDA under an Emergency Use Authorization (EUA). This EUA will remain  in effect (meaning this test can be used) for the duration of the COVID-19 declaration under Section 564(b)(1) of the Act, 21 U.S.C.section 360bbb-3(b)(1), unless the authorization is terminated  or revoked  sooner.       Influenza A by PCR 11/30/2020 NEGATIVE  NEGATIVE Final   Influenza B by PCR 11/30/2020 NEGATIVE  NEGATIVE Final   Comment: (NOTE) The Xpert Xpress SARS-CoV-2/FLU/RSV plus assay is intended as an aid in the diagnosis of influenza from Nasopharyngeal swab specimens and should not be used as a sole basis for treatment. Nasal washings and aspirates are unacceptable for Xpert Xpress SARS-CoV-2/FLU/RSV testing.  Fact Sheet for Patients: BloggerCourse.com  Fact Sheet for Healthcare Providers: SeriousBroker.it  This test is not yet approved or cleared by the [place] FDA and has been authorized for detection and/or diagnosis of SARS-CoV-2 by FDA under an  Emergency Use Authorization (EUA). This EUA will remain in effect (meaning this test can be used) for the duration of the COVID-19 declaration under Section 564(b)(1) of the Act, 21 U.S.C. section 360bbb-3(b)(1), unless the authorization is terminated or revoked.  Performed at [facility], [address], [place], [place] [address]     Allergies: Patient has no known allergies.  PTA Medications: (Not in a hospital admission)   Medical Decision Making  Patient unable to contract for safety; patient will be admitted to [facility] for stabilization.  -    Recommendations  Based on my evaluation the patient does not appear to have an emergency medical condition.  Maricela Bo, NP 02/08/21  7:07 AM

## 2021-02-08 NOTE — ED Notes (Signed)
GIVEN LUNCH 

## 2021-02-08 NOTE — ED Notes (Signed)
SNACK

## 2021-02-08 NOTE — Group Therapy Note (Signed)
Personal Development Wednesday - Personal Development (Establishing Goals/Identifying Values)  Date: 02/08/21  Type of Therapy/Therapeutic Modalities: Group, Solution-Focused, CBT, Motivational Interviewing   Participation Level: Active  Objective: To assist patients in identifying personal values that influence the goals they want to accomplish throughout their lives. Facilitators will guide discussion surrounding the framework of cognitive behavioral therapy - the concept our thoughts, feelings and behavioral responses are connected. Patients will reflect on how their values and goals will motivate them to a road of recovery.   Therapeutic Goals:  1. Patient will review the cognitive triangle to understand how thoughts, influence feelings and our feelings influence our behavioral responses.  2. Patient will identify personal values and discuss how they reflect in the patient's life currently.  3. Patient will identify current goals and how they can develop a strategy for identifying solutions.  4. Patient will participate in group discussion to reflect and share experiences for additional support.   Summary of Patient's Progress: The focus of this wrap up group is to help patients review their daily goal of treatment and discuss progress on daily workbooks. Pt stated that his goal for today was to gain more information about impatient hospital stay or rehab treatment. Pt shared that one positive outcome that happened for him today is that he had a moment to speak with the doctor about his current needs. Tomorrow, pt has a goal to make an effort to attend and engage in more groups. Pt rated his day a 5/10.

## 2021-02-08 NOTE — ED Notes (Signed)
Pt ambulated per self on unit. No s/s pain, discomfort, or acute distress noted. Endorsing SI and states he sees "things that are different things" when he has not slept well. No HI or AH reported. Oriented to staff and unit. Will continue to monitor for safety

## 2021-02-08 NOTE — ED Notes (Signed)
Pt refused snack, but did have milk (2).

## 2021-02-08 NOTE — ED Notes (Signed)
Pt resting in bed. A&O x4, calm and cooperative. Reports SI with no plan. Verbally contracts for safety. Denies HI/AVH. No signs of acute distress noted. Will continue to monitor for safety.

## 2021-02-08 NOTE — ED Notes (Signed)
Pt refused Gabapentin. [name], NP made aware. Pt denies any immediate needs at this time.

## 2021-02-08 NOTE — Progress Notes (Signed)
Pt is asleep. Respirations are even and unlabored. No signs of acute distress noted.  Pt's safety is maintained. 

## 2021-02-08 NOTE — ED Notes (Signed)
Pt asleep in bed. Respirations even and unlabored. Will continue to monitor for safety. ?

## 2021-02-08 NOTE — Group Therapy Note (Signed)
Personal Development Wednesday - Personal Development (Establishing Goals/Identifying Values)  Date: 02/08/21  Type of Therapy/Therapeutic Modalities: Group, Solution-Focused, CBT, Motivational Interviewing   Participation Level: Did Not Attend  Objective: To assist patients in identifying personal values that influence the goals they want to accomplish throughout their lives. Facilitators will guide discussion surrounding the framework of cognitive behavioral therapy - the concept our thoughts, feelings and behavioral responses are connected. Patients will reflect on how their values and goals will motivate them to a road of recovery.   Therapeutic Goals:  1. Patient will review the cognitive triangle to understand how thoughts, influence feelings and our feelings influence our behavioral responses.  2. Patient will identify personal values and discuss how they reflect in the patient's life currently.  3. Patient will identify current goals and how they can develop a strategy for identifying solutions.  4. Patient will participate in group discussion to reflect and share experiences for additional support.   Summary of Patient's Progress: Informed patient that group is beneficial. Made patient aware of program goals. Patient stated personal goal is to get rest and not over-think.

## 2021-02-08 NOTE — Progress Notes (Signed)
This provider discussed with the patient current and past psychotropics. He states that he is not currently taking any medications. He was unable to recall past psychotropics but when named he states that he did take Neurontin, Effexor and Trazodone. Discussed re-starting the stated medications:  -Effexor 37.5 mg po daily for depression. -Neurontin 100 mg po TID for anxiety. -Trazodone 50 mg po at bedtime as needed for sleep.     Patient denies having any medication allergies.
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
## 2021-02-08 NOTE — Progress Notes (Signed)
Given dinner

## 2021-02-08 NOTE — Progress Notes (Signed)
Pt is admitted to [facility] bed 157 due to SI with no plan. Pt verbally contracts for safety on the unit. Pt is alert and oriented with flat affect. Pt was cooperative with assessment. Generalized rash was noted on bilateral arms and legs which pt reports are bug bites. Pt is ambulatory and is oriented to staff and unit. Pt denies HI/AVH at this time. 15 minutes check for safety initiated. Staff will monitor for pt's safety.

## 2021-02-09 NOTE — ED Notes (Signed)
Pt offered lunch; declined. 

## 2021-02-09 NOTE — Group Therapy Note (Signed)
Personal DevelopmentEstablishing Goals/Identifying Values - Wrap-up  Date: 02/09/21  Type of Therapy/Therapeutic Modalities: Group, Solution-Focused, CBT, Motivational Interviewing   Participation Level: Did Not Attend  Objective: To assist patients in identifying personal values that influence the goals they want to accomplish throughout their lives. Facilitators will guide discussion surrounding the framework of cognitive behavioral therapy - the concept our thoughts, feelings and behavioral responses are connected. Patients will reflect on how their values and goals will motivate them to a road of recovery.   Summary of Patient's Progress: Did not attend

## 2021-02-09 NOTE — ED Notes (Signed)
Pt offered dinner; declined 

## 2021-02-09 NOTE — ED Notes (Signed)
Spoke with pt about why he did not attend group. Pt stated he did not feel up to it. Pt was given a goal setting worksheet that he completed and he discussed his goal with this Clinical research associate. Pt tearful but cooperative.

## 2021-02-09 NOTE — ED Notes (Signed)
Pt given snack; cheez-its, peaches, apple sauce, nutri-grain bar.

## 2021-02-09 NOTE — ED Provider Notes (Signed)
Behavioral Health Progress Note  Date and Time: 02/09/2021 12:59 PM Name: Jared Jordan MRN:  [id number]  Subjective: Patient seen and examined face to face by this provider and chart reviewed. On approach, the patient is alert and oriented x4. His thought process is logical and speech is coherent at a decreased volume. His mood is depressed and affect is congruent. He denies suicidal ideations. He denies homicidal ideations. He denies auditory and visual hallucinations. He does not appear to be responding to internal or external stimuli. He reports poor sleep last night due to having racing thoughts. He states that he slept about 2 hours last night. When asked if he had taken the trazodone for sleep last night, he states that he did not know he could. He was advised to ask the nursing staff tonight for the trazodone if needed for sleep. He reports having a fair appetite and states that he ate breakfast this morning. He continues to endorse depressive symptoms, decreased energy, poor sleep, isolating, hopelessness and worthlessness. He states that he has not received his morning medications but will take his morning medications when he wakes up. He states that when he wakes up he will attend groups later. He has spent most of the morning and afternoon in bed. We discussed discharge plans, he states that he is interested in outpatient psychiatry, therapy, and substance abuse treatment. He states that he was using Roxicet 15 mg every 2 days for 1 month and his last use was one week ago. He denies any opiate withdrawal symptoms.  Diagnosis:  Final diagnoses:  MDD (major depressive disorder), recurrent severe, without psychosis (HCC)    Total Time spent with patient: 30 minutes  Past Psychiatric History: Polysubstance, MDD Past Medical History:  Past Medical History:  Diagnosis Date   Drug use     Past Surgical History:  Procedure Laterality Date   SHOULDER SURGERY     SHOULDER SURGERY Left  2020   Family History: No family history on file. Family Psychiatric  History: Unknown  Social History:  Social History   Substance and Sexual Activity  Alcohol Use Yes   Alcohol/week: 4.0 - 6.0 standard drinks   Types: 4 - 6 Cans of beer per week   Comment: 2-3 times weekly     Social History   Substance and Sexual Activity  Drug Use Not Currently   Types: IV, Methamphetamines   Comment: using 1gm everyday heroin    Social History   Socioeconomic History   Marital status: Single    Spouse name: Not on file   Number of children: Not on file   Years of education: Not on file   Highest education level: Not on file  Occupational History   Not on file  Tobacco Use   Smoking status: Every Day    Packs/day: 0.25    Years: 1.00    Pack years: 0.25    Types: Cigarettes   Smokeless tobacco: Never  Vaping Use   Vaping Use: Some days   Start date: 11/04/2018   Devices: Jewel Menthol  Substance and Sexual Activity   Alcohol use: Yes    Alcohol/week: 4.0 - 6.0 standard drinks    Types: 4 - 6 Cans of beer per week    Comment: 2-3 times weekly   Drug use: Not Currently    Types: IV, Methamphetamines    Comment: using 1gm everyday heroin   Sexual activity: Yes  Other Topics Concern   Not on file  Social History  Narrative   Not on file   Social Determinants of Health   Financial Resource Strain: Not on file  Food Insecurity: Not on file  Transportation Needs: Not on file  Physical Activity: Not on file  Stress: Not on file  Social Connections: Not on file   SDOH:  SDOH Screenings   Alcohol Screen: Low Risk    Last Alcohol Screening Score (AUDIT): 7  Depression (PHQ2-9): Medium Risk   PHQ-2 Score: 26  Financial Resource Strain: Not on file  Food Insecurity: Not on file  Housing: Not on file  Physical Activity: Not on file  Social Connections: Not on file  Stress: Not on file  Tobacco Use: High Risk   Smoking Tobacco Use: Every Day   Smokeless Tobacco Use:  Never  Transportation Needs: Not on file   Additional Social History:    Pain Medications: None Prescriptions: None Over the Counter: None History of alcohol / drug use?: No history of alcohol / drug abuse (No current use of substances.)     Sleep: Poor  Appetite:  Good  Current Medications:  Current Facility-Administered Medications  Medication Dose Route Frequency Provider Last Rate Last Admin   acetaminophen (TYLENOL) tablet 650 mg  650 mg Oral Q6H PRN Ajibola, Ene A, NP       alum & mag hydroxide-simeth (MAALOX/MYLANTA) 200-200-20 MG/5ML suspension 30 mL  30 mL Oral Q4H PRN Ajibola, Ene A, NP       gabapentin (NEURONTIN) capsule 100 mg  100 mg Oral TID Oryan Winterton L, NP   100 mg at 02/09/21 1012   hydrOXYzine (ATARAX/VISTARIL) tablet 25 mg  25 mg Oral TID PRN Ajibola, Ene A, NP       magnesium hydroxide (MILK OF MAGNESIA) suspension 30 mL  30 mL Oral Daily PRN Ajibola, Ene A, NP       traZODone (DESYREL) tablet 50 mg  50 mg Oral QHS PRN Ajibola, Ene A, NP       venlafaxine XR (EFFEXOR-XR) 24 hr capsule 37.5 mg  37.5 mg Oral Daily Darryll Raju L, NP   37.5 mg at 02/09/21 1013   Current Outpatient Medications  Medication Sig Dispense Refill   ibuprofen (ADVIL) 400 MG tablet Take 400 mg by mouth every 6 (six) hours as needed for mild pain or headache.      Labs  Lab Results:  Admission on 02/08/2021  Component Date Value Ref Range Status   SARS Coronavirus 2 by RT PCR 02/08/2021 NEGATIVE  NEGATIVE Final   Comment: (NOTE) SARS-CoV-2 target nucleic acids are NOT DETECTED.  The SARS-CoV-2 RNA is generally detectable in upper respiratory specimens during the acute phase of infection. The lowest concentration of SARS-CoV-2 viral copies this assay can detect is 138 copies/mL. A negative result does not preclude SARS-Cov-2 infection and should not be used as the sole basis for treatment or other patient management decisions. A negative result may occur with  improper  specimen collection/handling, submission of specimen other than nasopharyngeal swab, presence of viral mutation(s) within the areas targeted by this assay, and inadequate number of viral copies(<138 copies/mL). A negative result must be combined with clinical observations, patient history, and epidemiological information. The expected result is Negative.  Fact Sheet for Patients:  BloggerCourse.comhttps://www.fda.gov/media/152166/download  Fact Sheet for Healthcare Providers:  SeriousBroker.ithttps://www.fda.gov/media/152162/download  This test is no                          t yet approved or  cleared by the [place] and  has been authorized for detection and/or diagnosis of SARS-CoV-2 by FDA under an Emergency Use Authorization (EUA). This EUA will remain  in effect (meaning this test can be used) for the duration of the COVID-19 declaration under Section 564(b)(1) of the Act, 21 U.S.C.section 360bbb-3(b)(1), unless the authorization is terminated  or revoked sooner.       Influenza A by PCR 02/08/2021 NEGATIVE  NEGATIVE Final   Influenza B by PCR 02/08/2021 NEGATIVE  NEGATIVE Final   Comment: (NOTE) The Xpert Xpress SARS-CoV-2/FLU/RSV plus assay is intended as an aid in the diagnosis of influenza from Nasopharyngeal swab specimens and should not be used as a sole basis for treatment. Nasal washings and aspirates are unacceptable for Xpert Xpress SARS-CoV-2/FLU/RSV testing.  Fact Sheet for Patients: BloggerCourse.com  Fact Sheet for Healthcare Providers: SeriousBroker.it  This test is not yet approved or cleared by the [place] FDA and has been authorized for detection and/or diagnosis of SARS-CoV-2 by FDA under an Emergency Use Authorization (EUA). This EUA will remain in effect (meaning this test can be used) for the duration of the COVID-19 declaration under Section 564(b)(1) of the Act, 21 U.S.C. section 360bbb-3(b)(1), unless the  authorization is terminated or revoked.  Performed at [facility], [address], [place], [place] [address]    WBC 02/08/2021 6.5  4.0 - 10.5 K/uL Final   RBC 02/08/2021 5.15  4.22 - 5.81 MIL/uL Final   Hemoglobin 02/08/2021 14.9  13.0 - 17.0 g/dL Final   HCT 90/24/0973 44.9  39.0 - 52.0 % Final   MCV 02/08/2021 87.2  80.0 - 100.0 fL Final   MCH 02/08/2021 28.9  26.0 - 34.0 pg Final   MCHC 02/08/2021 33.2  30.0 - 36.0 g/dL Final   RDW 53/29/9242 13.0  11.5 - 15.5 % Final   Platelets 02/08/2021 330  150 - 400 K/uL Final   nRBC 02/08/2021 0.0  0.0 - 0.2 % Final   Neutrophils Relative % 02/08/2021 69  % Final   Neutro Abs 02/08/2021 4.5  1.7 - 7.7 K/uL Final   Lymphocytes Relative 02/08/2021 20  % Final   Lymphs Abs 02/08/2021 1.3  0.7 - 4.0 K/uL Final   Monocytes Relative 02/08/2021 7  % Final   Monocytes Absolute 02/08/2021 0.4  0.1 - 1.0 K/uL Final   Eosinophils Relative 02/08/2021 3  % Final   Eosinophils Absolute 02/08/2021 0.2  0.0 - 0.5 K/uL Final   Basophils Relative 02/08/2021 0  % Final   Basophils Absolute 02/08/2021 0.0  0.0 - 0.1 K/uL Final   Immature Granulocytes 02/08/2021 1  % Final   Abs Immature Granulocytes 02/08/2021 0.05  0.00 - 0.07 K/uL Final   Performed at [facility], [address], [place], [place] [address]   Sodium 02/08/2021 137  135 - 145 mmol/L Final   Potassium 02/08/2021 4.4  3.5 - 5.1 mmol/L Final   Chloride 02/08/2021 101  98 - 111 mmol/L Final   CO2 02/08/2021 26  22 - 32 mmol/L Final   Glucose, Bld 02/08/2021 75  70 - 99 mg/dL Final   Glucose reference range applies only to samples taken after fasting for at least 8 hours.   BUN 02/08/2021 12  6 - 20 mg/dL Final   Creatinine, Ser 02/08/2021 0.83  0.61 - 1.24 mg/dL Final   Calcium 96/22/2979 9.9  8.9 - 10.3 mg/dL Final   Total Protein 89/21/1941 6.9  6.5 -  8.1 g/dL Final   Albumin 16/04/9603 4.0  3.5 - 5.0 g/dL Final   AST 54/03/8118 19  15 - 41 U/L Final   ALT 02/08/2021 19   0 - 44 U/L Final   Alkaline Phosphatase 02/08/2021 87  38 - 126 U/L Final   Total Bilirubin 02/08/2021 0.4  0.3 - 1.2 mg/dL Final   GFR, Estimated 02/08/2021 >60  >60 mL/min Final   Comment: (NOTE) Calculated using the CKD-EPI Creatinine Equation (2021)    Anion gap 02/08/2021 10  5 - 15 Final   Performed at [facility], [address] [place], [place] [address]   Alcohol, Ethyl (B) 02/08/2021 <10  <10 mg/dL Final   Comment: (NOTE) Lowest detectable limit for serum alcohol is 10 mg/dL.  For medical purposes only. Performed at [facility], [address] [place], [place] [address]    POC Amphetamine UR 02/08/2021 None Detected  NONE DETECTED (Cut Off Level 1000 ng/mL) Final   POC Secobarbital (BAR) 02/08/2021 None Detected  NONE DETECTED (Cut Off Level 300 ng/mL) Final   POC Buprenorphine (BUP) 02/08/2021 None Detected  NONE DETECTED (Cut Off Level 10 ng/mL) Final   POC Oxazepam (BZO) 02/08/2021 None Detected  NONE DETECTED (Cut Off Level 300 ng/mL) Final   POC Cocaine UR 02/08/2021 None Detected  NONE DETECTED (Cut Off Level 300 ng/mL) Final   POC Methamphetamine UR 02/08/2021 None Detected  NONE DETECTED (Cut Off Level 1000 ng/mL) Final   POC Morphine 02/08/2021 None Detected  NONE DETECTED (Cut Off Level 300 ng/mL) Final   POC Oxycodone UR 02/08/2021 None Detected  NONE DETECTED (Cut Off Level 100 ng/mL) Final   POC Methadone UR 02/08/2021 None Detected  NONE DETECTED (Cut Off Level 300 ng/mL) Final   POC Marijuana UR 02/08/2021 None Detected  NONE DETECTED (Cut Off Level 50 ng/mL) Final   SARSCOV2ONAVIRUS 2 AG 02/08/2021 NEGATIVE  NEGATIVE Final   Comment: (NOTE) SARS-CoV-2 antigen NOT DETECTED.   Negative results are presumptive.  Negative results do not preclude SARS-CoV-2 infection and should not be used as the sole basis for treatment or other patient management decisions, including infection  control decisions, particularly in the presence of clinical signs  and  symptoms consistent with COVID-19, or in those who have been in contact with the virus.  Negative results must be combined with clinical observations, patient history, and epidemiological information. The expected result is Negative.  Fact Sheet for Patients: https://www.jennings-kim.com/  Fact Sheet for Healthcare Providers: https://alexander-rogers.biz/  This test is not yet approved or cleared by the [place] FDA and  has been authorized for detection and/or diagnosis of SARS-CoV-2 by FDA under an Emergency Use Authorization (EUA).  This EUA will remain in effect (meaning this test can be used) for the duration of  the COV                          ID-19 declaration under Section 564(b)(1) of the Act, 21 U.S.C. section 360bbb-3(b)(1), unless the authorization is terminated or revoked sooner.     SARS Coronavirus 2 Ag 02/08/2021 Negative  Negative Final   SARSCOV2ONAVIRUS 2 AG 02/08/2021 NEGATIVE  NEGATIVE Final   Comment: (NOTE) SARS-CoV-2 antigen NOT DETECTED.   Negative results are presumptive.  Negative results do not preclude SARS-CoV-2 infection and should not be used as the sole basis for treatment or other patient management decisions, including infection  control decisions, particularly in the presence of  clinical signs and  symptoms consistent with COVID-19, or in those who have been in contact with the virus.  Negative results must be combined with clinical observations, patient history, and epidemiological information. The expected result is Negative.  Fact Sheet for Patients: https://www.jennings-kim.com/  Fact Sheet for Healthcare Providers: https://alexander-rogers.biz/  This test is not yet approved or cleared by the [place] FDA and  has been authorized for detection and/or diagnosis of SARS-CoV-2 by FDA under an Emergency Use Authorization (EUA).  This EUA will remain in effect (meaning this  test can be used) for the duration of  the COV                          ID-19 declaration under Section 564(b)(1) of the Act, 21 U.S.C. section 360bbb-3(b)(1), unless the authorization is terminated or revoked sooner.    Admission on 11/30/2020, Discharged on 12/04/2020  Component Date Value Ref Range Status   Hepatitis B Surface Ag 12/01/2020 NON REACTIVE  NON REACTIVE Final   HCV Ab 12/01/2020 Reactive (A) NON REACTIVE Final   Comment: (NOTE) The CDC recommends that a Reactive HCV antibody result be followed up  with a HCV Nucleic Acid Amplification test.     Hep A IgM 12/01/2020 See Scanned report in [place] [facility] (A) NON REACTIVE Final   Performed at [facility]   Hep B C IgM 12/01/2020 NON REACTIVE  NON REACTIVE Final   Performed at [facility], [address], [place], [place] [address]   Sodium 12/01/2020 137  135 - 145 mmol/L Final   Potassium 12/01/2020 4.8  3.5 - 5.1 mmol/L Final   Chloride 12/01/2020 102  98 - 111 mmol/L Final   CO2 12/01/2020 25  22 - 32 mmol/L Final   Glucose, Bld 12/01/2020 132 (A) 70 - 99 mg/dL Final   Glucose reference range applies only to samples taken after fasting for at least 8 hours.   BUN 12/01/2020 15  6 - 20 mg/dL Final   Creatinine, Ser 12/01/2020 0.80  0.61 - 1.24 mg/dL Final   Calcium 60/45/4098 9.7  8.9 - 10.3 mg/dL Final   Total Protein 11/91/4782 7.9  6.5 - 8.1 g/dL Final   Albumin 95/62/1308 4.5  3.5 - 5.0 g/dL Final   AST 65/78/4696 59 (A) 15 - 41 U/L Final   ALT 12/01/2020 93 (A) 0 - 44 U/L Final   Alkaline Phosphatase 12/01/2020 77  38 - 126 U/L Final   Total Bilirubin 12/01/2020 0.6  0.3 - 1.2 mg/dL Final   GFR, Estimated 12/01/2020 >60  >60 mL/min Final   Comment: (NOTE) Calculated using the CKD-EPI Creatinine Equation (2021)    Anion gap 12/01/2020 10  5 - 15 Final   Performed at [facility], [address], [place], [place] [address]   TSH 12/01/2020 0.276 (A) 0.350 - 4.500 uIU/mL  Final   Comment: Performed by a 3rd Generation assay with a functional sensitivity of <=0.01 uIU/mL. Performed at [facility], [address] [place], [place] [address]    Free T4 12/03/2020 0.76  0.61 - 1.12 ng/dL Final   Comment: (NOTE) Biotin ingestion may interfere with free T4 tests. If the results are inconsistent with the TSH level, previous test results, or the clinical presentation, then consider biotin interference. If needed, order repeat testing after stopping biotin. Performed at [facility], [address], [place], [place] [address]  T3, Free 12/03/2020 2.5  2.0 - 4.4 pg/mL Final   Comment: (NOTE) Performed At: [facility] [address] [place], [place] [id number] Jolene Schimke MD [id number]    Cholesterol 12/03/2020 174  0 - 200 mg/dL Final   Triglycerides 22/29/7989 84  <150 mg/dL Final   HDL 21/19/4174 61  >40 mg/dL Final   Total CHOL/HDL Ratio 12/03/2020 2.9  RATIO Final   VLDL 12/03/2020 17  0 - 40 mg/dL Final   LDL Cholesterol 12/03/2020 96  0 - 99 mg/dL Final   Comment:        Total Cholesterol/HDL:CHD Risk Coronary Heart Disease Risk Table                     Men   Women  1/2 Average Risk   3.4   3.3  Average Risk       5.0   4.4  2 X Average Risk   9.6   7.1  3 X Average Risk  23.4   11.0        Use the calculated Patient Ratio above and the CHD Risk Table to determine the patient's CHD Risk.        ATP III CLASSIFICATION (LDL):  <100     mg/dL   Optimal  081-448  mg/dL   Near or Above                    Optimal  130-159  mg/dL   Borderline  185-631  mg/dL   High  >497     mg/dL   Very High Performed at [facility], [address], [place], [place] [address]    HCV Quantitative 12/03/2020 68,900  >50 IU/mL Final   HCV Quantitative Log 12/03/2020 4.838  >1.70 log10 IU/mL Final   Test Information 12/03/2020 Comment   Final   Comment: (NOTE) The quantitative range of this assay is  15 IU/mL to 100 million IU/mL. Performed At: [facility] [address] [place], [place] [id number] Jolene Schimke MD [id number]    Total Protein 12/03/2020 7.1  6.5 - 8.1 g/dL Final   Albumin 94/70/9628 4.3  3.5 - 5.0 g/dL Final   AST 36/62/9476 49 (A) 15 - 41 U/L Final   ALT 12/03/2020 89 (A) 0 - 44 U/L Final   Alkaline Phosphatase 12/03/2020 68  38 - 126 U/L Final   Total Bilirubin 12/03/2020 0.5  0.3 - 1.2 mg/dL Final   Bilirubin, Direct 12/03/2020 <0.1  0.0 - 0.2 mg/dL Final   Indirect Bilirubin 12/03/2020 NOT CALCULATED  0.3 - 0.9 mg/dL Final   Performed at [facility], [address], [place], [place] [address]   Hgb A1c MFr Bld 12/03/2020 5.3  4.8 - 5.6 % Final   Comment: (NOTE)         Prediabetes: 5.7 - 6.4         Diabetes: >6.4         Glycemic control for adults with diabetes: <7.0    Mean Plasma Glucose 12/03/2020 105  mg/dL Final   Comment: (NOTE) Performed At: [facility] [address] [place], [place] [id number] Jolene Schimke MD [id number]    Hepatitis C Quantitation 12/01/2020 27,600  IU/mL Final   HCV log10 12/01/2020 4.441  log10 IU/mL Final   Test Information (HCV): 12/01/2020 Comment   Final   Comment: (NOTE) The quantitative range of this assay is 15 IU/mL to 100 million IU/mL.  Interpretation (HCV): 12/01/2020 Comment   Final   Comment: (NOTE) Positive HCV antibody screen with the presence of HCV RNA is consistent with active infection. Performed At: [facility] [address] [place], [place] [id number] Jolene Schimke MD [id number]   Admission on 11/30/2020, Discharged on 11/30/2020  Component Date Value Ref Range Status   Sodium 11/30/2020 133 (A) 135 - 145 mmol/L Final   Potassium 11/30/2020 3.6  3.5 - 5.1 mmol/L Final   Chloride 11/30/2020 100  98 - 111 mmol/L Final   CO2 11/30/2020 24  22 - 32 mmol/L Final   Glucose, Bld 11/30/2020 106 (A) 70 - 99 mg/dL Final   Glucose reference  range applies only to samples taken after fasting for at least 8 hours.   BUN 11/30/2020 13  6 - 20 mg/dL Final   Creatinine, Ser 11/30/2020 0.94  0.61 - 1.24 mg/dL Final   Calcium 57/84/6962 9.4  8.9 - 10.3 mg/dL Final   Total Protein 95/28/4132 6.4 (A) 6.5 - 8.1 g/dL Final   Albumin 44/07/270 3.9  3.5 - 5.0 g/dL Final   AST 53/66/4403 34  15 - 41 U/L Final   ALT 11/30/2020 58 (A) 0 - 44 U/L Final   Alkaline Phosphatase 11/30/2020 64  38 - 126 U/L Final   Total Bilirubin 11/30/2020 0.5  0.3 - 1.2 mg/dL Final   GFR, Estimated 11/30/2020 >60  >60 mL/min Final   Comment: (NOTE) Calculated using the CKD-EPI Creatinine Equation (2021)    Anion gap 11/30/2020 9  5 - 15 Final   Performed at [facility], [address], [place], [place] [address]   Alcohol, Ethyl (B) 11/30/2020 <10  <10 mg/dL Final   Comment: (NOTE) Lowest detectable limit for serum alcohol is 10 mg/dL.  For medical purposes only. Performed at [facility], [address] [place], [place] [address]    Salicylate Lvl 11/30/2020 <7.0 (A) 7.0 - 30.0 mg/dL Final   Performed at [facility], [address], [place], [place] [address]   Acetaminophen (Tylenol), Serum 11/30/2020 <10 (A) 10 - 30 ug/mL Final   Comment: (NOTE) Therapeutic concentrations vary significantly. A range of 10-30 ug/mL  may be an effective concentration for many patients. However, some  are best treated at concentrations outside of this range. Acetaminophen concentrations >150 ug/mL at 4 hours after ingestion  and >50 ug/mL at 12 hours after ingestion are often associated with  toxic reactions.  Performed at [facility], [address] [place], [place] [address]    WBC 11/30/2020 8.6  4.0 - 10.5 K/uL Final   RBC 11/30/2020 4.26  4.22 - 5.81 MIL/uL Final   Hemoglobin 11/30/2020 12.5 (A) 13.0 - 17.0 g/dL Final   HCT 88/41/6606 37.7 (A) 39.0 - 52.0 % Final   MCV 11/30/2020 88.5  80.0 - 100.0 fL Final   MCH 11/30/2020 29.3   26.0 - 34.0 pg Final   MCHC 11/30/2020 33.2  30.0 - 36.0 g/dL Final   RDW 30/16/0109 13.1  11.5 - 15.5 % Final   Platelets 11/30/2020 220  150 - 400 K/uL Final   nRBC 11/30/2020 0.0  0.0 - 0.2 % Final   Performed at [facility], [address] [place], [place] [address]   Opiates 11/30/2020 POSITIVE (A) NONE DETECTED Final   Cocaine 11/30/2020 NONE DETECTED  NONE DETECTED Final   Benzodiazepines 11/30/2020 NONE DETECTED  NONE DETECTED Final   Amphetamines 11/30/2020 NONE DETECTED  NONE DETECTED Final   Tetrahydrocannabinol 11/30/2020 NONE DETECTED  NONE DETECTED Final   Barbiturates 11/30/2020 NONE DETECTED  NONE DETECTED Final   Comment: (NOTE) DRUG SCREEN FOR MEDICAL PURPOSES ONLY.  IF CONFIRMATION IS NEEDED FOR ANY PURPOSE, NOTIFY LAB WITHIN 5 DAYS.  LOWEST DETECTABLE LIMITS FOR URINE DRUG SCREEN Drug Class                     Cutoff (ng/mL) Amphetamine and metabolites    1000 Barbiturate and metabolites    200 Benzodiazepine                 200 Tricyclics and metabolites     300 Opiates and metabolites        300 Cocaine and metabolites        300 THC                            50 Performed at [facility], [address] [place], [place] [address]    SARS Coronavirus 2 by RT PCR 11/30/2020 NEGATIVE  NEGATIVE Final   Comment: (NOTE) SARS-CoV-2 target nucleic acids are NOT DETECTED.  The SARS-CoV-2 RNA is generally detectable in upper respiratory specimens during the acute phase of infection. The lowest concentration of SARS-CoV-2 viral copies this assay can detect is 138 copies/mL. A negative result does not preclude SARS-Cov-2 infection and should not be used as the sole basis for treatment or other patient management decisions. A negative result may occur with  improper specimen collection/handling, submission of specimen other than nasopharyngeal swab, presence of viral mutation(s) within the areas targeted by this assay, and inadequate number of  viral copies(<138 copies/mL). A negative result must be combined with clinical observations, patient history, and epidemiological information. The expected result is Negative.  Fact Sheet for Patients:  BloggerCourse.com  Fact Sheet for Healthcare Providers:  SeriousBroker.it  This test is no                          t yet approved or cleared by the [place] FDA and  has been authorized for detection and/or diagnosis of SARS-CoV-2 by FDA under an Emergency Use Authorization (EUA). This EUA will remain  in effect (meaning this test can be used) for the duration of the COVID-19 declaration under Section 564(b)(1) of the Act, 21 U.S.C.section 360bbb-3(b)(1), unless the authorization is terminated  or revoked sooner.       Influenza A by PCR 11/30/2020 NEGATIVE  NEGATIVE Final   Influenza B by PCR 11/30/2020 NEGATIVE  NEGATIVE Final   Comment: (NOTE) The Xpert Xpress SARS-CoV-2/FLU/RSV plus assay is intended as an aid in the diagnosis of influenza from Nasopharyngeal swab specimens and should not be used as a sole basis for treatment. Nasal washings and aspirates are unacceptable for Xpert Xpress SARS-CoV-2/FLU/RSV testing.  Fact Sheet for Patients: BloggerCourse.com  Fact Sheet for Healthcare Providers: SeriousBroker.it  This test is not yet approved or cleared by the [place] FDA and has been authorized for detection and/or diagnosis of SARS-CoV-2 by FDA under an Emergency Use Authorization (EUA). This EUA will remain in effect (meaning this test can be used) for the duration of the COVID-19 declaration under Section 564(b)(1) of the Act, 21 U.S.C. section 360bbb-3(b)(1), unless the authorization is terminated or revoked.  Performed at [facility], [address]  [place], [place] [address]  Blood Alcohol level:  Lab Results  Component Value Date    ETH <10 02/08/2021   ETH <10 11/30/2020    Metabolic Disorder Labs: Lab Results  Component Value Date   HGBA1C 5.3 12/03/2020   MPG 105 12/03/2020   MPG 108.28 11/23/2019   No results found for: PROLACTIN Lab Results  Component Value Date   CHOL 174 12/03/2020   TRIG 84 12/03/2020   HDL 61 12/03/2020   CHOLHDL 2.9 12/03/2020   VLDL 17 12/03/2020   LDLCALC 96 12/03/2020   LDLCALC 116 (H) 11/23/2019    Therapeutic Lab Levels: No results found for: LITHIUM No results found for: VALPROATE No components found for:  CBMZ  Physical Findings   AIMS    Flowsheet Row [facility]) [facility]  AIMS Total Score 0 0      AUDIT    Flowsheet Row [facility]) [facility] Ad[facility] ([facility]  Alcohol Use Disorder Identification Test Final Score (AUDIT) 7 8      PHQ2-9    Flowsheet Row ED from 11/30/2020 in [facility] EM[facility]PART[facility]  PHQ-2 Total Score 5  PHQ-9 Total Score 26      Flowsheet Row ED from 02/08/2021 in [facility] ED from 01/27/2021 in [facility] Ad[facility]  C-SSRS RISK CATEGORY Low Risk No Risk High Risk        Musculoskeletal  Strength & Muscle Tone: within normal limits Gait & Station: normal Patient leans: N/A  Psychiatric Specialty Exam  Presentation  General Appearance: Appropriate for Environment  Eye Contact:Fair  Speech:Clear and Coherent  Speech Volume:Normal  Handedness:Right   Mood and Affect  Mood:Depressed  Affect:Congruent   Thought Process  Thought Processes:Coherent; Goal  Directed  Descriptions of Associations:Intact  Orientation:Full (Time, Place and Person)  Thought Content:WDL  Diagnosis of Schizophrenia or Schizoaffective disorder in past: No    Hallucinations:Hallucinations: None  Ideas of Reference:None  Suicidal Thoughts:Suicidal Thoughts: No SI Active Intent and/or Plan: With Plan  Homicidal Thoughts:Homicidal Thoughts: No   Sensorium  Memory:Immediate Fair; Remote Fair; Recent Fair  Judgment:Fair  Insight:Fair   Executive Functions  Concentration:Fair  Attention Span:Fair  Recall:Fair  Fund of Knowledge:Fair  Language:Fair   Psychomotor Activity  Psychomotor Activity:Psychomotor Activity: Normal   Assets  Assets:Communication Skills; Desire for Improvement; Housing; Leisure Time; Physical Health; Social Support   Sleep  Sleep:Sleep: Poor Number of Hours of Sleep: 2   No data recorded  Physical Exam  Physical Exam HENT:     Head: Normocephalic.  Eyes:     Conjunctiva/sclera: Conjunctivae normal.  Cardiovascular:     Rate and Rhythm: Normal rate.  Pulmonary:     Effort: Pulmonary effort is normal.  Musculoskeletal:        General: Normal range of motion.     Cervical back: Normal range of motion.  Neurological:     Mental Status: He is alert and oriented to person, place, and time.   Review of Systems  Constitutional: Negative.   HENT: Negative.    Eyes: Negative.   Respiratory: Negative.    Cardiovascular: Negative.   Skin: Negative.   Neurological: Negative.   Endo/Heme/Allergies: Negative.   Psychiatric/Behavioral:  Positive for depression. Negative for  substance abuse and suicidal ideas. The patient has insomnia.   Blood pressure (!) 145/92, pulse 60, temperature 99 F (37.2 C), temperature source Oral, resp. rate 18, height 5\' 10"  (1.778 m), weight 165 lb (74.8 kg), SpO2 100 %. Body mass index is 23.68 kg/m.  Treatment Plan Summary: Daily contact with patient to assess and evaluate  symptoms and progress in treatment and Medication management -Effexor 37.5 mg po daily for depression. -Neurontin 100 mg po TID for anxiety. -Trazodone 50 mg po at bedtime as needed for sleep. -Psychotherapy   , NP 02/09/2021 12:59 PM

## 2021-02-09 NOTE — ED Notes (Signed)
Pt offered breakfast;declined. 

## 2021-02-09 NOTE — ED Notes (Signed)
Pt asleep in bed. Respirations even and unlabored. Will continue to monitor for safety. ?

## 2021-02-09 NOTE — Group Therapy Note (Signed)
Patient did not participate in this group therapy session. Pt was encouraged to come to group and pt verbalized that he would be willing to participate in the next group. Will continue to check in on patient. Safety maintained.

## 2021-02-09 NOTE — Progress Notes (Signed)
Received Moris this AM asleep in his room, he got up to received his medications and returned to his bed. He has been mostly isolated throughout the day. His affect is flat and he endorsed feeling depressed.

## 2021-02-09 NOTE — ED Notes (Addendum)
Pt resting in bed. A&O x4, calm and cooperative. When asked if pt is having SI pt states, "Not so much, I'm feeling better today, just tired." Pt denies HI/AVH. Pt denies any immediate needs. No signs of acute distress noted. Will continue to monitor for safety.

## 2021-02-10 MED ORDER — HYDROXYZINE HCL 25 MG PO TABS: 25.0000 mg | ORAL_TABLET | Freq: Three times a day (TID) | ORAL | 0 refills | Status: DC | PRN

## 2021-02-10 MED ORDER — ONDANSETRON 4 MG PO TBDP: 4.0000 mg | ORAL_TABLET | Freq: Three times a day (TID) | ORAL | Status: DC | PRN

## 2021-02-10 MED ORDER — VENLAFAXINE HCL ER 37.5 MG PO CP24: 37.5000 mg | ORAL_CAPSULE | Freq: Every day | ORAL | 0 refills | Status: DC

## 2021-02-10 NOTE — ED Provider Notes (Signed)
[facility] Discharge Suicide Risk Assessment   Principal Problem: <principal problem not specified> Discharge Diagnoses: Active Problems:   MDD (major depressive disorder), recurrent severe, without psychosis (HCC)   Total Time spent with patient: 15 minutes  Musculoskeletal: Strength & Muscle Tone: within normal limits Gait & Station: normal Patient leans: N/A  Psychiatric Specialty Exam  Presentation  General Appearance: Appropriate for Environment; Casual  Eye Contact:Fair  Speech:Clear and Coherent; Normal Rate  Speech Volume:Normal  Handedness:Right   Mood and Affect  Mood:Depressed  Duration of Depression Symptoms: Greater than two weeks  Affect:Congruent   Thought Process  Thought Processes:Goal Directed; Coherent  Descriptions of Associations:Intact  Orientation:Full (Time, Place and Person)  Thought Content:Logical  History of Schizophrenia/Schizoaffective disorder:No  Duration of Psychotic Symptoms:No data recorded Hallucinations:Hallucinations: None  Ideas of Reference:None  Suicidal Thoughts:Suicidal Thoughts: No  Homicidal Thoughts:Homicidal Thoughts: No   Sensorium  Memory:Immediate Good; Recent Good; Remote Good  Judgment:Fair  Insight:Fair   Executive Functions  Concentration:Good  Attention Span:Good  Recall:Good  Fund of Knowledge:Good  Language:Good   Psychomotor Activity  Psychomotor Activity:Psychomotor Activity: Normal   Assets  Assets:Communication Skills; Desire for Improvement; Housing; Physical Health; Social Support; Resilience   Sleep  Sleep:Sleep: Fair Number of Hours of Sleep: 6   Physical Exam: Physical Exam Constitutional:      Appearance: Normal appearance. He is normal weight.  HENT:     Head: Normocephalic and atraumatic.  Eyes:     Extraocular Movements: Extraocular movements intact.  Pulmonary:     Effort: Pulmonary effort is normal.  Neurological:     Mental Status: He is alert.    Review of Systems  Constitutional:  Negative for chills and fever.  HENT:  Negative for hearing loss.   Eyes:  Negative for discharge and redness.  Respiratory:  Negative for cough.   Cardiovascular:  Negative for chest pain.  Gastrointestinal:  Negative for abdominal pain.  Musculoskeletal:  Negative for myalgias.  Neurological:  Negative for headaches.  Psychiatric/Behavioral:  Positive for substance abuse. Negative for hallucinations and suicidal ideas.   Blood pressure (!) 123/97, pulse 100, temperature 97.7 F (36.5 C), temperature source Oral, resp. rate 18, height 5\' 10"  (1.778 m), weight 74.8 kg, SpO2 99 %. Body mass index is 23.68 kg/m.  Mental Status Per Nursing Assessment::   On Admission:     Demographic Factors:  Male and Caucasian  Loss Factors: NA  Historical Factors: Prior suicide attempts and Impulsivity  Risk Reduction Factors:   Positive social support and future orientated-he is focused on attending school; states that classes start next week and that he needs to register for class next week  Continued Clinical Symptoms:  Alcohol/Substance Abuse/Dependencies Previous Psychiatric Diagnoses and Treatments  Cognitive Features That Contribute To Risk:  None    Suicide Risk:  Minimal: No identifiable suicidal ideation.  Patients presenting with no risk factors but with morbid ruminations; may be classified as minimal risk based on the severity of the depressive symptoms   Follow-up Information     [facility] Follow up.   Specialty: Addiction Medicine Why: Referral made Contact information: [address] [place] [phone]         [name] Follow up.   Why: Referral made Contact information: [phone] [place] [phone]         [facility] Follow up on 02/10/2021.   Specialty: Behavioral Health Contact  information: [phone] [place] [address] [phone]                Plan Of Care/Follow-up recommendations:  Activity:  as tolerated Diet:  regular Other:    Patient is instructed prior to discharge to: Take all medications as prescribed by his/her mental healthcare provider. Report any adverse effects and or reactions from the medicines to his/her outpatient provider promptly. Patient has been instructed & cautioned: To not engage in alcohol and or illegal drug use while on prescription medicines. In the event of worsening symptoms, patient is instructed to call the crisis hotline, 911 and or go to the nearest ED for appropriate evaluation and treatment of symptoms. To follow-up with his/her primary care provider for your other medical issues, concerns and or health care needs.   Patient was initially interested in residential substance use treatment although today he reports he feels ready for discharge and will follow up with referrals outpatient.  Estella Husk, MD 02/10/2021, 12:33 PM

## 2021-02-10 NOTE — ED Provider Notes (Signed)
Behavioral Health Progress Note  Date and Time: 02/10/2021 10:21 AM Name: Jared Jordan MRN:  [id number]  Subjective:  Jared Jordan, 30 y.o., male patient seen face to face by this provider, chart reviewed and discussed with Dr. [name] on 02/10/21.  On evaluation Jared Jordan states, "I need long term treatment for my depression and drug use".  Patient calm/cooperative, alert/oriented x 4, and does not appear to be responding to internal/external stimuli. He makes good eye contact. His speech is clear, coherent, normal rate and volume. Patient's feelings and progress discussed. States he feels like his depression has improved a little, states "but I am not there yet, I still need help". Patient reports decreased appetite and broken sleep. States his broken sleep is due to his racing thoughts. He is tolerating medications without adverse reactions. He has declined attending/participating in group sessions. Patient denied suicidal/homicidal/self-harm ideation and paranoia. Denies auditory and visual hallucinations. Denies access to weapons/firearms. Patient contracts for safety. States he would like long term treatment for his substance use. States he uses opiates, Roxicet 30-50 mg every other day and heroin 1/2 gm every other day. States he had not used for a week before he was admitted. Denies any withdrawal symptoms. Reassurance, support, and encouragement provided.  States he ate a fruit cup that upset his stomach. Patient is requested medication for nausea.Zofran  PO Q8H PRN added.   Diagnosis:  Final diagnoses:  MDD (major depressive disorder), recurrent severe, without psychosis (HCC)    Total Time spent with patient: 30 minutes  Past Psychiatric History: Polysubstance, MDD Past Medical History:  Past Medical History:  Diagnosis Date   Drug use     Past Surgical History:  Procedure Laterality Date   SHOULDER SURGERY     SHOULDER SURGERY Left 2020   Family  History: No family history on file. Family Psychiatric  History: unknown Social History:  Social History   Substance and Sexual Activity  Alcohol Use Yes   Alcohol/week: 4.0 - 6.0 standard drinks   Types: 4 - 6 Cans of beer per week   Comment: 2-3 times weekly     Social History   Substance and Sexual Activity  Drug Use Not Currently   Types: IV, Methamphetamines   Comment: using 1gm everyday heroin    Social History   Socioeconomic History   Marital status: Single    Spouse name: Not on file   Number of children: Not on file   Years of education: Not on file   Highest education level: Not on file  Occupational History   Not on file  Tobacco Use   Smoking status: Every Day    Packs/day: 0.25    Years: 1.00    Pack years: 0.25    Types: Cigarettes   Smokeless tobacco: Never  Vaping Use   Vaping Use: Some days   Start date: 11/04/2018   Devices: Jewel Menthol  Substance and Sexual Activity   Alcohol use: Yes    Alcohol/week: 4.0 - 6.0 standard drinks    Types: 4 - 6 Cans of beer per week    Comment: 2-3 times weekly   Drug use: Not Currently    Types: IV, Methamphetamines    Comment: using 1gm everyday heroin   Sexual activity: Yes  Other Topics Concern   Not on file  Social History Narrative   Not on file   Social Determinants of Health   Financial Resource Strain: Not on file  Food Insecurity: Not on file  Transportation  Needs: Not on file  Physical Activity: Not on file  Stress: Not on file  Social Connections: Not on file   SDOH:  SDOH Screenings   Alcohol Screen: Low Risk    Last Alcohol Screening Score (AUDIT): 7  Depression (PHQ2-9): Medium Risk   PHQ-2 Score: 26  Financial Resource Strain: Not on file  Food Insecurity: Not on file  Housing: Not on file  Physical Activity: Not on file  Social Connections: Not on file  Stress: Not on file  Tobacco Use: High Risk   Smoking Tobacco Use: Every Day   Smokeless Tobacco Use: Never   Transportation Needs: Not on file   Additional Social History:    Pain Medications: None Prescriptions: None Over the Counter: None History of alcohol / drug use?: No history of alcohol / drug abuse (No current use of substances.)      Sleep: Fair  Appetite:  Fair  Current Medications:  Current Facility-Administered Medications  Medication Dose Route Frequency Provider Last Rate Last Admin   acetaminophen (TYLENOL) tablet 650 mg  650 mg Oral Q6H PRN Ajibola, Ene A, NP       alum & mag hydroxide-simeth (MAALOX/MYLANTA) 200-200-20 MG/5ML suspension 30 mL  30 mL Oral Q4H PRN Ajibola, Ene A, NP       gabapentin (NEURONTIN) capsule 100 mg  100 mg Oral TID White, Patrice L, NP   100 mg at 02/10/21 0934   hydrOXYzine (ATARAX/VISTARIL) tablet 25 mg  25 mg Oral TID PRN Ajibola, Ene A, NP       magnesium hydroxide (MILK OF MAGNESIA) suspension 30 mL  30 mL Oral Daily PRN Ajibola, Ene A, NP       ondansetron (ZOFRAN-ODT) disintegrating tablet 4 mg  4 mg Oral Q8H PRN Ardis Hughs, NP       traZODone (DESYREL) tablet 50 mg  50 mg Oral QHS PRN Ajibola, Ene A, NP   50 mg at 02/09/21 2055   venlafaxine XR (EFFEXOR-XR) 24 hr capsule 37.5 mg  37.5 mg Oral Daily White, Patrice L, NP   37.5 mg at 02/10/21 1829   Current Outpatient Medications  Medication Sig Dispense Refill   ibuprofen (ADVIL) 400 MG tablet Take 400 mg by mouth every 6 (six) hours as needed for mild pain or headache.      Labs  Lab Results:  Admission on 02/08/2021  Component Date Value Ref Range Status   SARS Coronavirus 2 by RT PCR 02/08/2021 NEGATIVE  NEGATIVE Final   Comment: (NOTE) SARS-CoV-2 target nucleic acids are NOT DETECTED.  The SARS-CoV-2 RNA is generally detectable in upper respiratory specimens during the acute phase of infection. The lowest concentration of SARS-CoV-2 viral copies this assay can detect is 138 copies/mL. A negative result does not preclude SARS-Cov-2 infection and should not be used as  the sole basis for treatment or other patient management decisions. A negative result may occur with  improper specimen collection/handling, submission of specimen other than nasopharyngeal swab, presence of viral mutation(s) within the areas targeted by this assay, and inadequate number of viral copies(<138 copies/mL). A negative result must be combined with clinical observations, patient history, and epidemiological information. The expected result is Negative.  Fact Sheet for Patients:  BloggerCourse.com  Fact Sheet for Healthcare Providers:  SeriousBroker.it  This test is no                          t yet approved or cleared by  the [employer] and  has been authorized for detection and/or diagnosis of SARS-CoV-2 by FDA under an Emergency Use Authorization (EUA). This EUA will remain  in effect (meaning this test can be used) for the duration of the COVID-19 declaration under Section 564(b)(1) of the Act, 21 U.S.C.section 360bbb-3(b)(1), unless the authorization is terminated  or revoked sooner.       Influenza A by PCR 02/08/2021 NEGATIVE  NEGATIVE Final   Influenza B by PCR 02/08/2021 NEGATIVE  NEGATIVE Final   Comment: (NOTE) The Xpert Xpress SARS-CoV-2/FLU/RSV plus assay is intended as an aid in the diagnosis of influenza from Nasopharyngeal swab specimens and should not be used as a sole basis for treatment. Nasal washings and aspirates are unacceptable for Xpert Xpress SARS-CoV-2/FLU/RSV testing.  Fact Sheet for Patients: BloggerCourse.com  Fact Sheet for Healthcare Providers: SeriousBroker.it  This test is not yet approved or cleared by the [place] FDA and has been authorized for detection and/or diagnosis of SARS-CoV-2 by FDA under an Emergency Use Authorization (EUA). This EUA will remain in effect (meaning this test can be used) for the duration of  the COVID-19 declaration under Section 564(b)(1) of the Act, 21 U.S.C. section 360bbb-3(b)(1), unless the authorization is terminated or revoked.  Performed at [facility], [address], [place], [place] [address]    WBC 02/08/2021 6.5  4.0 - 10.5 K/uL Final   RBC 02/08/2021 5.15  4.22 - 5.81 MIL/uL Final   Hemoglobin 02/08/2021 14.9  13.0 - 17.0 g/dL Final   HCT 46/96/2952 44.9  39.0 - 52.0 % Final   MCV 02/08/2021 87.2  80.0 - 100.0 fL Final   MCH 02/08/2021 28.9  26.0 - 34.0 pg Final   MCHC 02/08/2021 33.2  30.0 - 36.0 g/dL Final   RDW 84/13/2440 13.0  11.5 - 15.5 % Final   Platelets 02/08/2021 330  150 - 400 K/uL Final   nRBC 02/08/2021 0.0  0.0 - 0.2 % Final   Neutrophils Relative % 02/08/2021 69  % Final   Neutro Abs 02/08/2021 4.5  1.7 - 7.7 K/uL Final   Lymphocytes Relative 02/08/2021 20  % Final   Lymphs Abs 02/08/2021 1.3  0.7 - 4.0 K/uL Final   Monocytes Relative 02/08/2021 7  % Final   Monocytes Absolute 02/08/2021 0.4  0.1 - 1.0 K/uL Final   Eosinophils Relative 02/08/2021 3  % Final   Eosinophils Absolute 02/08/2021 0.2  0.0 - 0.5 K/uL Final   Basophils Relative 02/08/2021 0  % Final   Basophils Absolute 02/08/2021 0.0  0.0 - 0.1 K/uL Final   Immature Granulocytes 02/08/2021 1  % Final   Abs Immature Granulocytes 02/08/2021 0.05  0.00 - 0.07 K/uL Final   Performed at [facility], [address], [place], [place] [address]   Sodium 02/08/2021 137  135 - 145 mmol/L Final   Potassium 02/08/2021 4.4  3.5 - 5.1 mmol/L Final   Chloride 02/08/2021 101  98 - 111 mmol/L Final   CO2 02/08/2021 26  22 - 32 mmol/L Final   Glucose, Bld 02/08/2021 75  70 - 99 mg/dL Final   Glucose reference range applies only to samples taken after fasting for at least 8 hours.   BUN 02/08/2021 12  6 - 20 mg/dL Final   Creatinine, Ser 02/08/2021 0.83  0.61 - 1.24 mg/dL Final   Calcium 53/66/4403 9.9  8.9 - 10.3 mg/dL Final   Total Protein 47/42/5956 6.9  6.5 - 8.1  g/dL Final    Albumin 16/04/9603 4.0  3.5 - 5.0 g/dL Final   AST 54/03/8118 19  15 - 41 U/L Final   ALT 02/08/2021 19  0 - 44 U/L Final   Alkaline Phosphatase 02/08/2021 87  38 - 126 U/L Final   Total Bilirubin 02/08/2021 0.4  0.3 - 1.2 mg/dL Final   GFR, Estimated 02/08/2021 >60  >60 mL/min Final   Comment: (NOTE) Calculated using the CKD-EPI Creatinine Equation (2021)    Anion gap 02/08/2021 10  5 - 15 Final   Performed at [facility], [address], [place], [place] [address]   Alcohol, Ethyl (B) 02/08/2021 <10  <10 mg/dL Final   Comment: (NOTE) Lowest detectable limit for serum alcohol is 10 mg/dL.  For medical purposes only. Performed at [facility], [address], [place], [place] [address]    POC Amphetamine UR 02/08/2021 None Detected  NONE DETECTED (Cut Off Level 1000 ng/mL) Final   POC Secobarbital (BAR) 02/08/2021 None Detected  NONE DETECTED (Cut Off Level 300 ng/mL) Final   POC Buprenorphine (BUP) 02/08/2021 None Detected  NONE DETECTED (Cut Off Level 10 ng/mL) Final   POC Oxazepam (BZO) 02/08/2021 None Detected  NONE DETECTED (Cut Off Level 300 ng/mL) Final   POC Cocaine UR 02/08/2021 None Detected  NONE DETECTED (Cut Off Level 300 ng/mL) Final   POC Methamphetamine UR 02/08/2021 None Detected  NONE DETECTED (Cut Off Level 1000 ng/mL) Final   POC Morphine 02/08/2021 None Detected  NONE DETECTED (Cut Off Level 300 ng/mL) Final   POC Oxycodone UR 02/08/2021 None Detected  NONE DETECTED (Cut Off Level 100 ng/mL) Final   POC Methadone UR 02/08/2021 None Detected  NONE DETECTED (Cut Off Level 300 ng/mL) Final   POC Marijuana UR 02/08/2021 None Detected  NONE DETECTED (Cut Off Level 50 ng/mL) Final   SARSCOV2ONAVIRUS 2 AG 02/08/2021 NEGATIVE  NEGATIVE Final   Comment: (NOTE) SARS-CoV-2 antigen NOT DETECTED.   Negative results are presumptive.  Negative results do not preclude SARS-CoV-2 infection and should not be used as the sole basis for treatment or other patient  management decisions, including infection  control decisions, particularly in the presence of clinical signs and  symptoms consistent with COVID-19, or in those who have been in contact with the virus.  Negative results must be combined with clinical observations, patient history, and epidemiological information. The expected result is Negative.  Fact Sheet for Patients: https://www.jennings-kim.com/  Fact Sheet for Healthcare Providers: https://alexander-rogers.biz/  This test is not yet approved or cleared by the [place] FDA and  has been authorized for detection and/or diagnosis of SARS-CoV-2 by FDA under an Emergency Use Authorization (EUA).  This EUA will remain in effect (meaning this test can be used) for the duration of  the COV                          ID-19 declaration under Section 564(b)(1) of the Act, 21 U.S.C. section 360bbb-3(b)(1), unless the authorization is terminated or revoked sooner.     SARS Coronavirus 2 Ag 02/08/2021 Negative  Negative Final   SARSCOV2ONAVIRUS 2 AG 02/08/2021 NEGATIVE  NEGATIVE Final   Comment: (NOTE) SARS-CoV-2 antigen NOT DETECTED.   Negative results are presumptive.  Negative results do not preclude SARS-CoV-2 infection and should not be used as the sole basis for treatment or other patient management decisions, including infection  control decisions, particularly in the presence of clinical  signs and  symptoms consistent with COVID-19, or in those who have been in contact with the virus.  Negative results must be combined with clinical observations, patient history, and epidemiological information. The expected result is Negative.  Fact Sheet for Patients: https://www.jennings-kim.com/  Fact Sheet for Healthcare Providers: https://alexander-rogers.biz/  This test is not yet approved or cleared by the [place] FDA and  has been authorized for detection and/or diagnosis of  SARS-CoV-2 by FDA under an Emergency Use Authorization (EUA).  This EUA will remain in effect (meaning this test can be used) for the duration of  the COV                          ID-19 declaration under Section 564(b)(1) of the Act, 21 U.S.C. section 360bbb-3(b)(1), unless the authorization is terminated or revoked sooner.    Admission on 11/30/2020, Discharged on 12/04/2020  Component Date Value Ref Range Status   Hepatitis B Surface Ag 12/01/2020 NON REACTIVE  NON REACTIVE Final   HCV Ab 12/01/2020 Reactive (A) NON REACTIVE Final   Comment: (NOTE) The CDC recommends that a Reactive HCV antibody result be followed up  with a HCV Nucleic Acid Amplification test.     Hep A IgM 12/01/2020 See Scanned report in [place] Link (A) NON REACTIVE Final   Performed at [facility]   Hep B C IgM 12/01/2020 NON REACTIVE  NON REACTIVE Final   Performed at [facility], [address], [place], [place] [address]   Sodium 12/01/2020 137  135 - 145 mmol/L Final   Potassium 12/01/2020 4.8  3.5 - 5.1 mmol/L Final   Chloride 12/01/2020 102  98 - 111 mmol/L Final   CO2 12/01/2020 25  22 - 32 mmol/L Final   Glucose, Bld 12/01/2020 132 (A) 70 - 99 mg/dL Final   Glucose reference range applies only to samples taken after fasting for at least 8 hours.   BUN 12/01/2020 15  6 - 20 mg/dL Final   Creatinine, Ser 12/01/2020 0.80  0.61 - 1.24 mg/dL Final   Calcium 60/45/4098 9.7  8.9 - 10.3 mg/dL Final   Total Protein 11/91/4782 7.9  6.5 - 8.1 g/dL Final   Albumin 95/62/1308 4.5  3.5 - 5.0 g/dL Final   AST 65/78/4696 59 (A) 15 - 41 U/L Final   ALT 12/01/2020 93 (A) 0 - 44 U/L Final   Alkaline Phosphatase 12/01/2020 77  38 - 126 U/L Final   Total Bilirubin 12/01/2020 0.6  0.3 - 1.2 mg/dL Final   GFR, Estimated 12/01/2020 >60  >60 mL/min Final   Comment: (NOTE) Calculated using the CKD-EPI Creatinine Equation (2021)    Anion gap 12/01/2020 10  5 - 15 Final   Performed at [facility], [address], [place], [place] [address]   TSH 12/01/2020 0.276 (A) 0.350 - 4.500 uIU/mL Final   Comment: Performed by a 3rd Generation assay with a functional sensitivity of <=0.01 uIU/mL. Performed at [facility], [address] [place], [place] [address]    Free T4 12/03/2020 0.76  0.61 - 1.12 ng/dL Final   Comment: (NOTE) Biotin ingestion may interfere with free T4 tests. If the results are inconsistent with the TSH level, previous test results, or the clinical presentation, then consider biotin interference. If needed, order repeat testing after stopping biotin. Performed at [facility], [address], [place], [place] [address]    T3,  Free 12/03/2020 2.5  2.0 - 4.4 pg/mL Final   Comment: (NOTE) Performed At: [facility] [address] [place], [place] [id number] Jolene SchimkeNagendra Sanjai MD XL:[id number]:[phone]    Cholesterol 12/03/2020 174  0 - 200 mg/dL Final   Triglycerides 36/64/403405/31/2022 84  <150 mg/dL Final   HDL 74/25/956305/31/2022 61  >40 mg/dL Final   Total CHOL/HDL Ratio 12/03/2020 2.9  RATIO Final   VLDL 12/03/2020 17  0 - 40 mg/dL Final   LDL Cholesterol 12/03/2020 96  0 - 99 mg/dL Final   Comment:        Total Cholesterol/HDL:CHD Risk Coronary Heart Disease Risk Table                     Men   Women  1/2 Average Risk   3.4   3.3  Average Risk       5.0   4.4  2 X Average Risk   9.6   7.1  3 X Average Risk  23.4   11.0        Use the calculated Patient Ratio above and the CHD Risk Table to determine the patient's CHD Risk.        ATP III CLASSIFICATION (LDL):  <100     mg/dL   Optimal  [phone]  mg/dL   Near or Above                    Optimal  130-159  mg/dL   Borderline  [phone]  mg/dL   High  >841>190     mg/dL   Very High Performed at [facility], [address] [place], [place] [address]    HCV Quantitative 12/03/2020 68,900  >50 IU/mL Final   HCV Quantitative Log 12/03/2020 4.838  >1.70 log10 IU/mL  Final   Test Information 12/03/2020 Comment   Final   Comment: (NOTE) The quantitative range of this assay is 15 IU/mL to 100 million IU/mL. Performed At: [facility] [address] [place], [place] [id number] Jolene SchimkeNagendra Sanjai MD TF:[id number]:[phone]    Total Protein 12/03/2020 7.1  6.5 - 8.1 g/dL Final   Albumin 70/62/376205/31/2022 4.3  3.5 - 5.0 g/dL Final   AST 83/15/176105/31/2022 49 (A) 15 - 41 U/L Final   ALT 12/03/2020 89 (A) 0 - 44 U/L Final   Alkaline Phosphatase 12/03/2020 68  38 - 126 U/L Final   Total Bilirubin 12/03/2020 0.5  0.3 - 1.2 mg/dL Final   Bilirubin, Direct 12/03/2020 <0.1  0.0 - 0.2 mg/dL Final   Indirect Bilirubin 12/03/2020 NOT CALCULATED  0.3 - 0.9 mg/dL Final   Performed at [facility], [address] [place], [place] [address]   Hgb A1c MFr Bld 12/03/2020 5.3  4.8 - 5.6 % Final   Comment: (NOTE)         Prediabetes: 5.7 - 6.4         Diabetes: >6.4         Glycemic control for adults with diabetes: <7.0    Mean Plasma Glucose 12/03/2020 105  mg/dL Final   Comment: (NOTE) Performed At: [facility] [address] [place], [place] [id number] Jolene SchimkeNagendra Sanjai MD IO:[id number]:[phone]    Hepatitis C Quantitation 12/01/2020 27,600  IU/mL Final   HCV log10 12/01/2020 4.441  log10 IU/mL Final   Test Information (HCV): 12/01/2020 Comment   Final   Comment: (NOTE) The quantitative range of this assay is 15 IU/mL to 100 million IU/mL.  Interpretation (HCV): 12/01/2020 Comment   Final   Comment: (NOTE) Positive HCV antibody screen with the presence of HCV RNA is consistent with active infection. Performed At: [facility] [address] [place], [place] [id number] Jolene Schimke MD GY:[id number]   Admission on 11/30/2020, Discharged on 11/30/2020  Component Date Value Ref Range Status   Sodium 11/30/2020 133 (A) 135 - 145 mmol/L Final   Potassium 11/30/2020 3.6  3.5 - 5.1 mmol/L Final   Chloride 11/30/2020 100  98 - 111 mmol/L Final   CO2  11/30/2020 24  22 - 32 mmol/L Final   Glucose, Bld 11/30/2020 106 (A) 70 - 99 mg/dL Final   Glucose reference range applies only to samples taken after fasting for at least 8 hours.   BUN 11/30/2020 13  6 - 20 mg/dL Final   Creatinine, Ser 11/30/2020 0.94  0.61 - 1.24 mg/dL Final   Calcium 91/63/8466 9.4  8.9 - 10.3 mg/dL Final   Total Protein 59/93/5701 6.4 (A) 6.5 - 8.1 g/dL Final   Albumin 77/93/9030 3.9  3.5 - 5.0 g/dL Final   AST 03/28/3006 34  15 - 41 U/L Final   ALT 11/30/2020 58 (A) 0 - 44 U/L Final   Alkaline Phosphatase 11/30/2020 64  38 - 126 U/L Final   Total Bilirubin 11/30/2020 0.5  0.3 - 1.2 mg/dL Final   GFR, Estimated 11/30/2020 >60  >60 mL/min Final   Comment: (NOTE) Calculated using the CKD-EPI Creatinine Equation (2021)    Anion gap 11/30/2020 9  5 - 15 Final   Performed at [facility], [address], [place], [place] [address]   Alcohol, Ethyl (B) 11/30/2020 <10  <10 mg/dL Final   Comment: (NOTE) Lowest detectable limit for serum alcohol is 10 mg/dL.  For medical purposes only. Performed at [facility], [address] [place], [place] [address]    Salicylate Lvl 11/30/2020 <7.0 (A) 7.0 - 30.0 mg/dL Final   Performed at [facility], [address], [place], [place] [address]   Acetaminophen (Tylenol), Serum 11/30/2020 <10 (A) 10 - 30 ug/mL Final   Comment: (NOTE) Therapeutic concentrations vary significantly. A range of 10-30 ug/mL  may be an effective concentration for many patients. However, some  are best treated at concentrations outside of this range. Acetaminophen concentrations >150 ug/mL at 4 hours after ingestion  and >50 ug/mL at 12 hours after ingestion are often associated with  toxic reactions.  Performed at [facility], [address] [place], [place] [address]    WBC 11/30/2020 8.6  4.0 - 10.5 K/uL Final   RBC 11/30/2020 4.26  4.22 - 5.81 MIL/uL Final   Hemoglobin 11/30/2020 12.5 (A) 13.0 - 17.0 g/dL Final    HCT 42/87/6811 37.7 (A) 39.0 - 52.0 % Final   MCV 11/30/2020 88.5  80.0 - 100.0 fL Final   MCH 11/30/2020 29.3  26.0 - 34.0 pg Final   MCHC 11/30/2020 33.2  30.0 - 36.0 g/dL Final   RDW 57/26/2035 13.1  11.5 - 15.5 % Final   Platelets 11/30/2020 220  150 - 400 K/uL Final   nRBC 11/30/2020 0.0  0.0 - 0.2 % Final   Performed at [facility], [address], [place], [place] [address]   Opiates 11/30/2020 POSITIVE (A) NONE DETECTED Final   Cocaine 11/30/2020 NONE DETECTED  NONE DETECTED Final   Benzodiazepines 11/30/2020 NONE DETECTED  NONE DETECTED Final   Amphetamines 11/30/2020 NONE DETECTED  NONE DETECTED Final   Tetrahydrocannabinol 11/30/2020 NONE DETECTED  NONE DETECTED Final   Barbiturates 11/30/2020 NONE DETECTED  NONE DETECTED Final   Comment: (NOTE) DRUG SCREEN FOR MEDICAL PURPOSES ONLY.  IF CONFIRMATION IS NEEDED FOR ANY PURPOSE, NOTIFY LAB WITHIN 5 DAYS.  LOWEST DETECTABLE LIMITS FOR URINE DRUG SCREEN Drug Class                     Cutoff (ng/mL) Amphetamine and metabolites    1000 Barbiturate and metabolites    200 Benzodiazepine                 200 Tricyclics and metabolites     300 Opiates and metabolites        300 Cocaine and metabolites        300 THC                            50 Performed at [facility], [address] [place], [place] [address]    SARS Coronavirus 2 by RT PCR 11/30/2020 NEGATIVE  NEGATIVE Final   Comment: (NOTE) SARS-CoV-2 target nucleic acids are NOT DETECTED.  The SARS-CoV-2 RNA is generally detectable in upper respiratory specimens during the acute phase of infection. The lowest concentration of SARS-CoV-2 viral copies this assay can detect is 138 copies/mL. A negative result does not preclude SARS-Cov-2 infection and should not be used as the sole basis for treatment or other patient management decisions. A negative result may occur with  improper specimen collection/handling, submission of specimen other than  nasopharyngeal swab, presence of viral mutation(s) within the areas targeted by this assay, and inadequate number of viral copies(<138 copies/mL). A negative result must be combined with clinical observations, patient history, and epidemiological information. The expected result is Negative.  Fact Sheet for Patients:  BloggerCourse.com  Fact Sheet for Healthcare Providers:  SeriousBroker.it  This test is no                          t yet approved or cleared by the [place] FDA and  has been authorized for detection and/or diagnosis of SARS-CoV-2 by FDA under an Emergency Use Authorization (EUA). This EUA will remain  in effect (meaning this test can be used) for the duration of the COVID-19 declaration under Section 564(b)(1) of the Act, 21 U.S.C.section 360bbb-3(b)(1), unless the authorization is terminated  or revoked sooner.       Influenza A by PCR 11/30/2020 NEGATIVE  NEGATIVE Final   Influenza B by PCR 11/30/2020 NEGATIVE  NEGATIVE Final   Comment: (NOTE) The Xpert Xpress SARS-CoV-2/FLU/RSV plus assay is intended as an aid in the diagnosis of influenza from Nasopharyngeal swab specimens and should not be used as a sole basis for treatment. Nasal washings and aspirates are unacceptable for Xpert Xpress SARS-CoV-2/FLU/RSV testing.  Fact Sheet for Patients: BloggerCourse.com  Fact Sheet for Healthcare Providers: SeriousBroker.it  This test is not yet approved or cleared by the [place] FDA and has been authorized for detection and/or diagnosis of SARS-CoV-2 by FDA under an Emergency Use Authorization (EUA). This EUA will remain in effect (meaning this test can be used) for the duration of the COVID-19 declaration under Section 564(b)(1) of the Act, 21 U.S.C. section 360bbb-3(b)(1), unless the authorization is terminated or revoked.  Performed at [facility], [address], [place], [place] [address]  Blood Alcohol level:  Lab Results  Component Value Date   ETH <10 02/08/2021   ETH <10 11/30/2020    Metabolic Disorder Labs: Lab Results  Component Value Date   HGBA1C 5.3 12/03/2020   MPG 105 12/03/2020   MPG 108.28 11/23/2019   No results found for: PROLACTIN Lab Results  Component Value Date   CHOL 174 12/03/2020   TRIG 84 12/03/2020   HDL 61 12/03/2020   CHOLHDL 2.9 12/03/2020   VLDL 17 12/03/2020   LDLCALC 96 12/03/2020   LDLCALC 116 (H) 11/23/2019    Therapeutic Lab Levels: No results found for: LITHIUM No results found for: VALPROATE No components found for:  CBMZ  Physical Findings   AIMS    Flowsheet Row [facility]) [facility] Ad[facility]  AIMS Total Score 0 0      AUDIT    Flowsheet Row [facility]) [facility] Ad[facility] (Discharged[facility]  Alcohol Use Disorder Identification Test Final Score (AUDIT) 7 8      PHQ2-9    Flowsheet Row ED from 11/30/2020 in [facility] EMERGENCY DEPARTMENT  PHQ-2 Total Score 5  PHQ-9 Total Score 26      Flowsheet Row ED from 02/08/2021 in [facility] ED from 01/27/2021 in [facility] Ad[facility] ([facility]  C-SSRS RISK CATEGORY Low Risk No Risk High Risk        Musculoskeletal  Strength & Muscle Tone: within normal limits Gait & Station: normal Patient leans: N/A  Psychiatric Specialty Exam  Presentation  General Appearance: Appropriate for Environment; Casual  Eye Contact:Fair  Speech:Clear and Coherent; Normal Rate  Speech  Volume:Normal  Handedness:Right   Mood and Affect  Mood:Depressed  Affect:Congruent   Thought Process  Thought Processes:Goal Directed; Coherent  Descriptions of Associations:Intact  Orientation:Full (Time, Place and Person)  Thought Content:Logical  Diagnosis of Schizophrenia or Schizoaffective disorder in past: No    Hallucinations:Hallucinations: None  Ideas of Reference:None  Suicidal Thoughts:Suicidal Thoughts: No  Homicidal Thoughts:Homicidal Thoughts: No   Sensorium  Memory:Immediate Good; Recent Good; Remote Good  Judgment:Fair  Insight:Fair   Executive Functions  Concentration:Good  Attention Span:Good  Recall:Good  Fund of Knowledge:Good  Language:Good   Psychomotor Activity  Psychomotor Activity:Psychomotor Activity: Normal   Assets  Assets:Communication Skills; Desire for Improvement; Housing; Physical Health; Social Support; Resilience   Sleep  Sleep:Sleep: Fair Number of Hours of Sleep: 6   No data recorded  Physical Exam  Physical Exam Vitals and nursing note reviewed.  Constitutional:      Appearance: Normal appearance. He is well-developed.  HENT:     Head: Normocephalic and atraumatic.     Nose: No congestion or rhinorrhea.  Eyes:     General:        Right eye: No discharge.        Left eye: No discharge.     Conjunctiva/sclera: Conjunctivae normal.  Cardiovascular:     Rate and Rhythm: Normal rate and regular rhythm.  Pulmonary:     Effort: Pulmonary effort is normal. No respiratory distress.  Abdominal:     Tenderness: There is no abdominal tenderness.  Musculoskeletal:        General: Normal range of motion.     Cervical back:  Normal range of motion.  Skin:    Coloration: Skin is not jaundiced or pale.  Neurological:     Mental Status: He is alert and oriented to person, place, and time.  Psychiatric:        Attention and Perception: Attention and perception normal.        Mood and Affect: Mood is  depressed.        Speech: Speech normal.        Behavior: Behavior normal. Behavior is cooperative.        Thought Content: Thought content normal.        Cognition and Memory: Cognition normal.        Judgment: Judgment is impulsive.   Review of Systems  Constitutional: Negative.  Negative for chills and fever.  HENT: Negative.  Negative for hearing loss.   Eyes: Negative.   Respiratory: Negative.  Negative for cough.   Cardiovascular: Negative.  Negative for chest pain.  Gastrointestinal:  Positive for nausea.  Musculoskeletal: Negative.   Neurological: Negative.   Psychiatric/Behavioral:  Positive for depression.   Blood pressure (!) 145/92, pulse 60, temperature 99 F (37.2 C), temperature source Oral, resp. rate 18, height  (1.778 m), weight 165 lb (74.8 kg), SpO2 100 %. Body mass index is 23.68 kg/m.  Treatment Plan Summary: Daily contact with patient to assess and evaluate symptoms and progress in treatment and Medication management. Zofran 4 mg PO Q8H PRN added Case consult with LCSW Baldo Daub - patient requesting residential treatment. States if any beds open up it maybe tomorrow if he is accepted.   Ardis Hughs, NP 02/10/2021 10:21 AM

## 2021-02-10 NOTE — Discharge Summary (Signed)
Jared Jordan to be D/C'd Home per NP order. Discussed with the patient and all questions fully answered. An After Visit Summary was printed and given to the patient. Patient escorted out and D/C home via safe transport. [name]  02/10/2021 2:35 PM

## 2021-02-10 NOTE — Progress Notes (Addendum)
This Clinical research associate was notified by [facility] that Jared Jordan vomited. RN assessed Jared Jordan and he confirmed that he did vomit. Vernard Gambles, NP was notified and PRN Zofran was ordered. Jared Jordan refused Zofran when offered and stated that he is "fine now." Jared Jordan presently asleep. No signs of acute distress noted. Will continue to monitor.

## 2021-02-10 NOTE — ED Provider Notes (Signed)
[name]/OBS ASAP Discharge Summary  Date and Time: 02/10/2021 1:05 PM  Name: Jared Jordan  MRN:  [id number]   Discharge Diagnoses:  Final diagnoses:  MDD (major depressive disorder), recurrent severe, without psychosis (HCC)    Subjective: Jared Jordan, 30 y.o., male patient seen face to face by this provider, chart reviewed and discussed with Dr. [name] on 02/10/21.  On initial evaluation Jared Jordan states, "I need long term treatment for my depression and drug use". After assessesment patient asked to speak to this writer again. States, "I want to be discharged".  Patient states he will follow up with out patient resources for substance abuse. Request prescription for Effexor and Hydroxyzine be sent to his pharmacy.   Patient calm/cooperative, alert/oriented x 4, and does not appear to be responding to internal/external stimuli. He makes good eye contact. His speech is clear, coherent, normal rate and volume. States at this time, "my depression is stable enough for me to manage out side the hospital, I am ready to go home". Patient continue to deny suicidal/homicidal/self-harm ideation and paranoia. Denies auditory and visual hallucinations. Denies access to weapons/firearms. Patient contracts for safety.  States he uses opiates, Roxicet 30-50 mg every other day and heroin 1/2 gm every other day. However, patients UDS was negative. States he had not used for a week before he was admitted. Denies any withdrawal symptoms at this time.   Reports he is ready to be discharged, states, " I need to register for school and get my life back on track".  Denies provided a name or number for collateral. States he lives alone and there is no one to call.   The suicide prevention education provided includes the following: Suicide risk factors Suicide prevention and interventions National Suicide Hotline telephone number, [facility] assessment telephone  number [facility] Emergency Assistance 911 [facility] and/or Residential Mobile Crisis Unit telephone number.   Stay Summary:   Jared Jordan was admitted to [facility] for suicidal ideations, worsening depression and crisis management.  He was treated with the following medications; Effexor 37.5 mg QD and Hydroxyzine 25 mg TID PRN, which were tolerated with no adverse reactions.   Jared Jordan was discharged with current medication and was instructed on how to take medications as prescribed. His mental and emotional status was monitored by staff including his report of symptom reduction.   Jared Jordan was evaluated for stability and plans for continued recovery upon discharge.  Jared Jordan motivation was an integral factor for scheduling further treatment. He was offered further treatment options upon discharge including but not limited to Residential, Intensive Outpatient, Outpatient treatment, and Rehabilitation services. Agastya Meister will follow up with the services/resources provided to him. They were discussed and placed in his AVS. Patient states he would like to be discharged and he will follow up with resources on an out patient basis.     Upon completion of this admission the Jared Jordan was both mentally and medically stable for discharge denying suicidal/homicidal ideation, auditory/visual/tactile hallucinations, delusional thoughts and paranoia.     Total Time spent with patient: 30 minutes  Past Psychiatric History: polysubstance, MDD Past Medical History:  Past Medical History:  Diagnosis Date   Drug use     Past Surgical History:  Procedure Laterality Date   SHOULDER SURGERY     SHOULDER SURGERY Left 2020   Family History: No family history on file. Family Psychiatric History: unknown Social History:  Social History   Substance and Sexual Activity  Alcohol Use Yes   Alcohol/week: 4.0 - 6.0 standard drinks   Types: 4 - 6 Cans of beer per  week   Comment: 2-3 times weekly     Social History   Substance and Sexual Activity  Drug Use Not Currently   Types: IV, Methamphetamines   Comment: using 1gm everyday heroin    Social History   Socioeconomic History   Marital status: Single    Spouse name: Not on file   Number of children: Not on file   Years of education: Not on file   Highest education level: Not on file  Occupational History   Not on file  Tobacco Use   Smoking status: Every Day    Packs/day: 0.25    Years: 1.00    Pack years: 0.25    Types: Cigarettes   Smokeless tobacco: Never  Vaping Use   Vaping Use: Some days   Start date: 11/04/2018   Devices: Jewel Menthol  Substance and Sexual Activity   Alcohol use: Yes    Alcohol/week: 4.0 - 6.0 standard drinks    Types: 4 - 6 Cans of beer per week    Comment: 2-3 times weekly   Drug use: Not Currently    Types: IV, Methamphetamines    Comment: using 1gm everyday heroin   Sexual activity: Yes  Other Topics Concern   Not on file  Social History Narrative   Not on file   Social Determinants of Health   Financial Resource Strain: Not on file  Food Insecurity: Not on file  Transportation Needs: Not on file  Physical Activity: Not on file  Stress: Not on file  Social Connections: Not on file   SDOH:  SDOH Screenings   Alcohol Screen: Low Risk    Last Alcohol Screening Score (AUDIT): 7  Depression (PHQ2-9): Medium Risk   PHQ-2 Score: 26  Financial Resource Strain: Not on file  Food Insecurity: Not on file  Housing: Not on file  Physical Activity: Not on file  Social Connections: Not on file  Stress: Not on file  Tobacco Use: High Risk   Smoking Tobacco Use: Every Day   Smokeless Tobacco Use: Never  Transportation Needs: Not on file    Tobacco Cessation:  N/A, patient does not currently use tobacco products  Current Medications:  Current Facility-Administered Medications  Medication Dose Route Frequency Provider Last Rate Last Admin    acetaminophen (TYLENOL) tablet 650 mg  650 mg Oral Q6H PRN Ajibola, Ene A, NP       alum & mag hydroxide-simeth (MAALOX/MYLANTA) 200-200-20 MG/5ML suspension 30 mL  30 mL Oral Q4H PRN Ajibola, Ene A, NP       gabapentin (NEURONTIN) capsule 100 mg  100 mg Oral TID White, Patrice L, NP   100 mg at 02/10/21 0934   hydrOXYzine (ATARAX/VISTARIL) tablet 25 mg  25 mg Oral TID PRN Ajibola, Ene A, NP       magnesium hydroxide (MILK OF MAGNESIA) suspension 30 mL  30 mL Oral Daily PRN Ajibola, Ene A, NP       ondansetron (ZOFRAN-ODT) disintegrating tablet 4 mg  4 mg Oral Q8H PRN Ardis Hughsoleman, Dorothe Elmore H, NP       traZODone (DESYREL) tablet 50 mg  50 mg Oral QHS PRN Ajibola, Ene A, NP   50 mg at 02/09/21 2055   venlafaxine XR (EFFEXOR-XR) 24 hr capsule 37.5 mg  37.5 mg Oral Daily White, Patrice L, NP   37.5 mg at 02/10/21 [phone]  Current Outpatient Medications  Medication Sig Dispense Refill   ibuprofen (ADVIL) 400 MG tablet Take 400 mg by mouth every 6 (six) hours as needed for mild pain or headache.     hydrOXYzine (ATARAX/VISTARIL) 25 MG tablet Take 1 tablet (25 mg total) by mouth 3 (three) times daily as needed for anxiety. 30 tablet 0   [START ON 02/11/2021] venlafaxine XR (EFFEXOR-XR) 37.5 MG 24 hr capsule Take 1 capsule (37.5 mg total) by mouth daily. 30 capsule 0    PTA Medications: (Not in a hospital admission)   Musculoskeletal  Strength & Muscle Tone: within normal limits Gait & Station: normal Patient leans: N/A  Psychiatric Specialty Exam  Presentation  General Appearance: Appropriate for Environment; Casual  Eye Contact:Fair  Speech:Clear and Coherent; Normal Rate  Speech Volume:Normal  Handedness:Right   Mood and Affect  Mood:Depressed  Affect:Congruent   Thought Process  Thought Processes:Goal Directed; Coherent  Descriptions of Associations:Intact  Orientation:Full (Time, Place and Person)  Thought Content:Logical  Diagnosis of Schizophrenia or Schizoaffective disorder  in past: No    Hallucinations:Hallucinations: None  Ideas of Reference:None  Suicidal Thoughts:Suicidal Thoughts: No  Homicidal Thoughts:Homicidal Thoughts: No   Sensorium  Memory:Immediate Good; Recent Good; Remote Good  Judgment:Fair  Insight:Fair   Executive Functions  Concentration:Good  Attention Span:Good  Recall:Good  Fund of Knowledge:Good  Language:Good   Psychomotor Activity  Psychomotor Activity:Psychomotor Activity: Normal   Assets  Assets:Communication Skills; Desire for Improvement; Housing; Physical Health; Social Support; Resilience   Sleep  Sleep:Sleep: Fair Number of Hours of Sleep: 6   No data recorded  Physical Exam  Physical Exam Vitals and nursing note reviewed.  Constitutional:      Appearance: He is well-developed.  HENT:     Head: Normocephalic and atraumatic.     Nose: No congestion or rhinorrhea.  Eyes:     General:        Right eye: No discharge.        Left eye: No discharge.     Conjunctiva/sclera: Conjunctivae normal.  Cardiovascular:     Rate and Rhythm: Normal rate and regular rhythm.  Pulmonary:     Effort: Pulmonary effort is normal. No respiratory distress.  Musculoskeletal:        General: Normal range of motion.     Cervical back: Normal range of motion.  Skin:    Coloration: Skin is not jaundiced or pale.  Neurological:     Mental Status: He is alert and oriented to person, place, and time.  Psychiatric:        Attention and Perception: Attention and perception normal.        Mood and Affect: Mood is depressed.        Speech: Speech normal.        Behavior: Behavior normal. Behavior is cooperative.        Thought Content: Thought content normal.        Cognition and Memory: Cognition normal.        Judgment: Judgment is impulsive.   Review of Systems  Constitutional: Negative.  Negative for chills and fever.  HENT: Negative.  Negative for hearing loss.   Eyes: Negative.   Respiratory: Negative.   Negative for cough.   Cardiovascular: Negative.  Negative for chest pain.  Gastrointestinal:  Negative for nausea.  Musculoskeletal: Negative.   Skin: Negative.   Neurological: Negative.   Psychiatric/Behavioral:  Positive for depression.   Blood pressure (!) 123/97, pulse 100, temperature 97.7 F (36.5 C), temperature  source Oral, resp. rate 18, height 5\' 10"  (1.778 m), weight 165 lb (74.8 kg), SpO2 99 %. Body mass index is 23.68 kg/m.  Demographic Factors:  Male, Caucasian, and Living alone  Loss Factors: Decrease in vocational status  Historical Factors: Impulsivity  Risk Reduction Factors:   Positive coping skills or problem solving skills  Continued Clinical Symptoms:  Severe Anxiety and/or Agitation Depression:   Comorbid alcohol abuse/dependence Hopelessness Impulsivity Alcohol/Substance Abuse/Dependencies  Cognitive Features That Contribute To Risk:  None    Suicide Risk:  Minimal: No identifiable suicidal ideation.  Patients presenting with no risk factors but with morbid ruminations; may be classified as minimal risk based on the severity of the depressive symptoms  Plan Of Care/Follow-up recommendations:  Activity:  as tolerated  Diet:  regular  Disposition:   Discharge- via safe transport to home.  Effexor 37.5 mg PO QD and Hydoxyzine 25 mg TID PRN sent to [facility] [address] [place]    Follow up with the following resources.    [facility] Follow up.   Specialty: Addiction Medicine Why: Referral made Contact information: [address] [place] [phone]              Services, [name] Follow up.   Why: Referral made Contact information: [phone] [place] [phone]              [facility] Follow up on 02/10/2021.   Specialty: Behavioral Health Contact information: [address] [place]  [place] [phone]      No evidence of imminent risk to self or others at present.    Patient does not meet criteria for psychiatric inpatient admission. Discussed crisis plan, support from social network, calling 911, coming to the Emergency Department, and calling Suicide Hotline.   [phone], NP 02/10/2021, 1:05 PM

## 2021-02-10 NOTE — ED Notes (Signed)
Pt asleep in bed. Respirations even and unlabored. Will continue to monitor for safety. ?

## 2021-02-10 NOTE — Clinical Social Work Psych Note (Signed)
CSW DISCHARGE NOTE  CSW met with the patient for introduction and to discuss potential discharge planning. CSW received report from NP and MD that the patient was requesting to discharge and has been cleared.   Patient reports he lives alone and plans to return home upon discharge. Patient requested transportation assistance at discharge.    Upon admission to [facility], patient requested referrals for potential residential treatment services. CSW made referrals on the patient's behalf. Patient has been informed that he will need to follow up with these agencies to determine when a bed will become available. Patient expressed understanding.    CSW also charted [facility] walk-in hours for potential medication management and therapy services. Patient has been instructed to go during walk-in hours or to call office to establish outpatient services.    Patient reports he does not have any additional questions or concerns at this time.    CSW will continue to follow until discharge.     Radonna Ricker, MSW, LCSW Clinical Education officer, museum ([place]) [facility]

## 2021-02-10 NOTE — ED Notes (Signed)
Pt eating breakfast; 2 bowls of cereal

## 2021-02-10 NOTE — ED Notes (Signed)
Pt offered breakfast;declined. 

## 2021-02-10 NOTE — ED Notes (Signed)
Pt eating dinner; baked ziti, chicken and vegetables and water.

## 2021-02-10 NOTE — ED Notes (Signed)
This Clinical research associate noticed a wet substance and wet paper bag on the pt's floor. Pt stated that he vomited in a paper bag and that he wasn't feeling well. Pt was given a emesis bag.RN notified and came to assess pt. Will continue to monitor.

## 2021-02-12 ENCOUNTER — Other Ambulatory Visit (HOSPITAL_COMMUNITY)
Admission: EM | Admit: 2021-02-12 | Discharge: 2021-02-18 | Disposition: A | Discharge status: Home / Self Care | Payer: No Payment, Other | Attending: Psychiatry | Admitting: Psychiatry

## 2021-02-12 ENCOUNTER — Emergency Department (HOSPITAL_COMMUNITY)
Admission: EM | Admit: 2021-02-12 | Discharge: 2021-02-12 | Disposition: A | Discharge status: Other Acute Inpatient Hospital | Payer: Medicaid Other | Attending: Emergency Medicine | Admitting: Emergency Medicine

## 2021-02-12 ENCOUNTER — Encounter (HOSPITAL_COMMUNITY): Payer: Self-pay

## 2021-02-12 ENCOUNTER — Other Ambulatory Visit: Payer: Self-pay

## 2021-02-12 ENCOUNTER — Encounter (HOSPITAL_COMMUNITY): Payer: Self-pay | Admitting: Psychiatry

## 2021-02-12 DIAGNOSIS — F332 Major depressive disorder, recurrent severe without psychotic features: Secondary | ICD-10-CM | POA: Insufficient documentation

## 2021-02-12 DIAGNOSIS — F17210 Nicotine dependence, cigarettes, uncomplicated: Secondary | ICD-10-CM | POA: Insufficient documentation

## 2021-02-12 DIAGNOSIS — F1994 Other psychoactive substance use, unspecified with psychoactive substance-induced mood disorder: Secondary | ICD-10-CM | POA: Diagnosis present

## 2021-02-12 DIAGNOSIS — F1910 Other psychoactive substance abuse, uncomplicated: Secondary | ICD-10-CM | POA: Insufficient documentation

## 2021-02-12 DIAGNOSIS — F32A Depression, unspecified: Secondary | ICD-10-CM

## 2021-02-12 DIAGNOSIS — R45851 Suicidal ideations: Secondary | ICD-10-CM | POA: Insufficient documentation

## 2021-02-12 DIAGNOSIS — Z20822 Contact with and (suspected) exposure to covid-19: Secondary | ICD-10-CM | POA: Insufficient documentation

## 2021-02-12 LAB — POCT URINE DRUG SCREEN - MANUAL ENTRY (I-SCREEN)
POC Amphetamine UR: NOT DETECTED
POC Buprenorphine (BUP): NOT DETECTED
POC Cocaine UR: NOT DETECTED
POC Marijuana UR: POSITIVE — AB
POC Methadone UR: NOT DETECTED
POC Methamphetamine UR: NOT DETECTED
POC Morphine: POSITIVE — AB
POC Oxazepam (BZO): NOT DETECTED
POC Oxycodone UR: NOT DETECTED
POC Secobarbital (BAR): NOT DETECTED

## 2021-02-12 LAB — COMPREHENSIVE METABOLIC PANEL
ALT: 15 U/L (ref 0–44)
AST: 19 U/L (ref 15–41)
Albumin: 3.9 g/dL (ref 3.5–5.0)
Alkaline Phosphatase: 75 U/L (ref 38–126)
Anion gap: 5 (ref 5–15)
BUN: 13 mg/dL (ref 6–20)
CO2: 28 mmol/L (ref 22–32)
Calcium: 8.9 mg/dL (ref 8.9–10.3)
Chloride: 102 mmol/L (ref 98–111)
Creatinine, Ser: 0.56 mg/dL — ABNORMAL LOW (ref 0.61–1.24)
GFR, Estimated: >60 mL/min (ref >60)
Glucose, Bld: 92 mg/dL (ref 70–99)
Potassium: 4.1 mmol/L (ref 3.5–5.1)
Sodium: 135 mmol/L (ref 135–145)
Total Bilirubin: 0.6 mg/dL (ref 0.3–1.2)
Total Protein: 6.7 g/dL (ref 6.5–8.1)

## 2021-02-12 LAB — CBC WITH DIFFERENTIAL/PLATELET
Abs Immature Granulocytes: 0.03 10*3/uL (ref 0.00–0.07)
Basophils Absolute: 0.1 10*3/uL (ref 0.0–0.1)
Basophils Relative: 1 %
Eosinophils Absolute: 0.2 10*3/uL (ref 0.0–0.5)
Eosinophils Relative: 3 %
HCT: 40.7 % (ref 39.0–52.0)
Hemoglobin: 13.4 g/dL (ref 13.0–17.0)
Immature Granulocytes: 0 %
Lymphocytes Relative: 18 %
Lymphs Abs: 1.3 10*3/uL (ref 0.7–4.0)
MCH: 28.6 pg (ref 26.0–34.0)
MCHC: 32.9 g/dL (ref 30.0–36.0)
MCV: 87 fL (ref 80.0–100.0)
Monocytes Absolute: 0.4 10*3/uL (ref 0.1–1.0)
Monocytes Relative: 5 %
Neutro Abs: 5.2 10*3/uL (ref 1.7–7.7)
Neutrophils Relative %: 73 %
Platelets: 263 10*3/uL (ref 150–400)
RBC: 4.68 MIL/uL (ref 4.22–5.81)
RDW: 13 % (ref 11.5–15.5)
WBC: 7.1 10*3/uL (ref 4.0–10.5)
nRBC: 0 % (ref 0.0–0.2)

## 2021-02-12 LAB — RESP PANEL BY RT-PCR (FLU A&B, COVID) ARPGX2
Influenza A by PCR: NEGATIVE
Influenza B by PCR: NEGATIVE
SARS Coronavirus 2 by RT PCR: NEGATIVE

## 2021-02-12 LAB — ACETAMINOPHEN LEVEL: Acetaminophen (Tylenol), Serum: <10 ug/mL — ABNORMAL LOW (ref 10–30)

## 2021-02-12 LAB — SALICYLATE LEVEL: Salicylate Lvl: <7 mg/dL — ABNORMAL LOW (ref 7.0–30.0)

## 2021-02-12 LAB — ETHANOL: Alcohol, Ethyl (B): <10 mg/dL (ref <10)

## 2021-02-12 MED ORDER — TRAZODONE HCL 50 MG PO TABS
50.0000 mg | ORAL_TABLET | Freq: Every evening | ORAL | Status: DC | PRN
  Administered 2021-02-13: 50 mg via ORAL
  Filled 2021-02-12: qty 14
  Filled 2021-02-12 (×2): qty 1

## 2021-02-12 MED ORDER — HYDROXYZINE HCL 25 MG PO TABS
25.0000 mg | ORAL_TABLET | Freq: Three times a day (TID) | ORAL | Status: DC | PRN
  Administered 2021-02-13 – 2021-02-17 (×4): 25 mg via ORAL
  Filled 2021-02-12 (×4): qty 1
  Filled 2021-02-12: qty 20
  Filled 2021-02-12: qty 1

## 2021-02-12 MED ORDER — MAGNESIUM HYDROXIDE 400 MG/5ML PO SUSP: 30.0000 mL | Freq: Every day | ORAL | Status: DC | PRN

## 2021-02-12 MED ORDER — ACETAMINOPHEN 325 MG PO TABS: 650.0000 mg | ORAL_TABLET | Freq: Four times a day (QID) | ORAL | Status: DC | PRN

## 2021-02-12 MED ORDER — ALUM & MAG HYDROXIDE-SIMETH 200-200-20 MG/5ML PO SUSP: 30.0000 mL | ORAL | Status: DC | PRN

## 2021-02-12 MED ORDER — VENLAFAXINE HCL ER 37.5 MG PO CP24
37.5000 mg | ORAL_CAPSULE | Freq: Every day | ORAL | Status: DC
  Administered 2021-02-12 – 2021-02-13 (×2): 37.5 mg via ORAL
  Filled 2021-02-12 (×2): qty 1

## 2021-02-12 NOTE — ED Notes (Signed)
30 yo male pt from [facility] transfer to [facility] d/t pt endorsing SI with a plan to jump off of parking deck in [place]. Pt states, "I'm doing more harm than good. It's not easey being me. I'm disappointing my family and myself. It all started when my father died last year. Every since then, everything I attempt to do backfires on me". Pt tearful during interview process. Pt arrived by safe transport. Pt reports being unable to contract for safety if discharged to home. Pt states, "It's my fault this is happening to me but I'm ready for a change. I will do whatever I have to do this time. I have to, for myself and my family". Sad, depressed but cooperative throughout interview process. Skin assessment completed. Oriented to unit. Meal and drink offered. At currrent, pt continue to endorse SI. Denies HI/AVH. Informed pt to notify staff with any needs, concerns or urges/impuses to harm self prior to acting on impulses. Pt verbalize agreement. Will monitor for safety.

## 2021-02-12 NOTE — BH Assessment (Addendum)
Requested nursing Molli Hazard, RN and Whitney Post, RN) to place TTS machine in patient's room for his initial assessment.

## 2021-02-12 NOTE — ED Notes (Signed)
Pt A&Ox4, sleeping at present, no distress noted. Calm & cooperative.  Remains SI, contracts for safety.  Monitoring for safety.

## 2021-02-12 NOTE — ED Notes (Addendum)
Report called to Vernona Rieger at [facility]

## 2021-02-12 NOTE — ED Notes (Signed)
Patient wanded and 2 patient belonging bags placed at triage nurses station

## 2021-02-12 NOTE — ED Triage Notes (Addendum)
Patient arrived to the ED reporting SI with onset 2 days ago. Patient reports he was downtown last night on top of a parking garage thinking about hurting himself but his mom called and talked him down. Patient reports his father died last year and they share the same birthday. Patient also reports a lot of stressors in his life right now.   A/oX4 Ambulatory in triage  Denies drug use  Reports he has somewhere to live.  Calm and cooperative at this time.

## 2021-02-12 NOTE — ED Provider Notes (Signed)
[facility]-EMERGENCY DEPT Provider Note   CSN: [id number] Arrival date & time: 02/12/21  4650     History Chief Complaint  Patient presents with   Psychiatric Evaluation   Suicidal    Jery Younglove is a 30 y.o. male.  Patient is a 30 year old male with past medical history of major depressive disorder and substance disorder presenting for complaints of suicidal ideation.  Patient states over the last 2 days he has had increased suicidal ideation without a plan.  He states his father died on 08-18-2022 of this year and that they share the same birthday.  He states this is triggering him to be more depressed.  Denies any suicidal plans at this time.  Denies any homicidal ideations.  Denies auditory or visual hallucinations.  Denies alcohol or active drug use.  The history is provided by the patient. No language interpreter was used.  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  Social History   Tobacco Use   Smoking status: Every Day    Packs/day: 0.25    Years: 1.00    Pack years: 0.25    Types: Cigarettes   Smokeless tobacco: Never  Vaping Use   Vaping Use: Some days   Start date: 11/04/2018    Devices: Jewel Menthol  Substance Use Topics   Alcohol use: Yes    Alcohol/week: 4.0 - 6.0 standard drinks    Types: 4 - 6 Cans of beer per week    Comment: 2-3 times weekly   Drug use: Not Currently    Types: IV, Methamphetamines    Comment: using 1gm everyday heroin    Home Medications Prior to Admission medications   Medication Sig Start Date End Date Taking? Authorizing Provider  hydrOXYzine (ATARAX/VISTARIL) 25 MG tablet Take 1 tablet (25 mg total) by mouth 3 (three) times daily as needed for anxiety. 02/10/21   Ardis Hughs, NP  venlafaxine XR (EFFEXOR-XR) 37.5 MG 24 hr capsule Take 1 capsule (37.5 mg total) by mouth daily. 02/11/21   Ardis Hughs, NP    Allergies    Patient has no known allergies.  Review of Systems   Review of Systems  Constitutional:  Negative for chills and fever.  HENT:  Negative for ear pain and sore throat.   Eyes:  Negative for pain and visual disturbance.  Respiratory:  Negative for cough and shortness of breath.   Cardiovascular:  Negative for chest pain and palpitations.  Gastrointestinal:  Negative for abdominal pain and vomiting.  Genitourinary:  Negative for dysuria and hematuria.  Musculoskeletal:  Negative for arthralgias and back pain.  Skin:  Negative for color change and rash.  Neurological:  Negative for seizures and syncope.  Psychiatric/Behavioral:  Positive for suicidal ideas.   All other systems reviewed and are negative.  Physical Exam Updated Vital Signs BP 134/84   Pulse 70   Temp 98.3 F (36.8 C)   Resp 18   SpO2 100%   Physical Exam Vitals and nursing note reviewed.  Constitutional:      Appearance: He is well-developed.  HENT:     Head: Normocephalic and atraumatic.  Eyes:     Conjunctiva/sclera: Conjunctivae normal.  Cardiovascular:     Rate and Rhythm: Normal rate and regular rhythm.     Heart sounds: No murmur heard. Pulmonary:     Effort: Pulmonary effort is normal. No respiratory distress.      Breath sounds: Normal breath sounds.  Abdominal:     Palpations: Abdomen is soft.     Tenderness: There is no abdominal tenderness.  Musculoskeletal:     Cervical back: Neck supple.  Skin:    General: Skin is warm and dry.  Neurological:     Mental Status: He is alert.     GCS: GCS eye subscore is 4. GCS verbal subscore is 5. GCS motor subscore is 6.  Psychiatric:        Attention and Perception: He does not perceive auditory or visual hallucinations.        Mood and Affect: Mood is depressed. Affect is tearful.        Speech: Speech normal.        Behavior: Behavior normal.        Thought Content: Thought content includes suicidal ideation. Thought content does not include homicidal ideation. Thought content does not include homicidal or suicidal plan.    ED Results / Procedures / Treatments   Labs (all labs ordered are listed, but only abnormal results are displayed) Labs Reviewed  COMPREHENSIVE METABOLIC PANEL - Abnormal; Notable for the following components:      Result Value   Creatinine, Ser 0.56 (*)    All other components within normal limits  SALICYLATE LEVEL - Abnormal; Notable for the following components:   Salicylate Lvl <7.0 (*)    All other components within normal limits  ACETAMINOPHEN LEVEL - Abnormal; Notable for the following components:   Acetaminophen (Tylenol), Serum <10 (*)    All other components within normal limits  RESP PANEL BY RT-PCR (FLU A&B, COVID) ARPGX2  ETHANOL  CBC WITH DIFFERENTIAL/PLATELET    EKG None  Radiology No results found.  Procedures Procedures   Medications Ordered in ED Medications - No data to display  ED Course  I have reviewed the triage vital signs and the nursing notes.  Pertinent labs & imaging results that were available during my care of the patient were reviewed by me and considered in my medical decision making (see chart for details).    MDM Rules/Calculators/A&P                           30 year old  male with past medical history of major depressive disorder and substance disorder presenting for complaints of suicidal ideation.  Suicide precautions placed Patient awaiting evaluation by TTS Medically cleared at this time  10:19 AM Patient evaluated for TTS and recommendations are as follows:  Inpatient placement. Patient is accepted to the [facility] if medically cleared and COVID results are negative. the accepting provider is Dr. Myra Gianotti. Please call nurse report prior to sending patient #[phone] (Other #'s include [phone] or [phone]). I  will notify the Disposition Counselor Doylene Canning) as well. He may need patient to sign a VoluntaryAdmission form prior to transfer.   [place] completed by myself.  Patient awaiting transport.    Final Clinical Impression(s) / ED Diagnoses Final diagnoses:  Suicidal ideation  Depression, unspecified depression type    Rx / DC Orders ED Discharge Orders     None        Franne Forts, DO 02/13/21 1019

## 2021-02-12 NOTE — ED Provider Notes (Addendum)
Behavioral Health Admission [facility]&P [facility] & OBS)  Date: 02/12/21 Patient Name: Jemarcus Dougal MRN: [id number] Chief Complaint: No chief complaint on file.     Diagnoses:  Final diagnoses:  Major depressive disorder, recurrent severe without psychotic features (HCC)    HPI:   Per [facility] provider Patient is a 30 year old male with past medical history of major depressive disorder and substance disorder presenting for complaints of suicidal ideation.  Patient states over the last 2 days had increased suicidal ideation without a plan.  He states his father died on July 25, 2022 of this year and that they share the same birthday.  He states this is triggering him to be more depressed.  Any suicidal plan at this time.  Denies any homicidal ideations.  Denies auditory or visual hallucinations.  Denies alcohol or active drug use.  Patient was assessed by TTS and found to meet criteria for [facility]  Per [facility] MD Patient seen and chart reviewed. On chart review,  was recently admitted to the [facility] from 8/6-8/8. At that time he had requested to leave. He was discharged with effexor 37.5 and vistaril 25 mg TID PRN. On 8/7 He states that he was using Roxicet 15 mg every 2 days for 1 month and his last use was one week ago.    Pt states that upon discharge on 8/8, he went home, called mom and realized that he didn't get the help he needed and should have stayed longer at the [facility] instead of requesting discharge. Pt is intermittently tearful throughout assessment. "I want so bad to get back on track". Says he has not felt "on track" for ~1 yr. ~1 year ago his father died, he withdrew from school, lost his job and apartment and was also having some relationship difficulties. Pt states that his father died "out of nowhere"--worked for [employer] [place] [employer]. Did go to group therapy setting for awhile but wasn't ready to seek therapy and ultimately stopped attending. Patient states that pon discharge 2 days ago he did not register for  classes because he realized that he was not ready to go back. 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   He states sthat he was drinking on top of parking garage last night, thought about what people would think if they had heard he died. He states that his sister who recently had a baby called him while he was on the parking garage and urged him to not harm himself as she wants him to meet his niece. He describes hs sister as supportive and states that she is aware of his struggle with depression. He describes his mood as depressed, reports poor sleep, anhedonia (used to enjoy basketball, reading, going out with friends), guilt,  hopelessness, poor energy, concentration difficulties. Overall he reports poor appetite but states that it has improved somewhat although has lost ~15 lbs over the past several months. R reports SI without plan or intent and denies HI/AVH. He is tearful throughout assessment.  Pt expressed that he would like to continue effexor and reports compliance since discharge. He reports multiple hospitalizations but states that he does not attend follow up appointments and normally discontinues medications when the prescription runs out.  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 Denies recent use  and denies other illicit substance use Use of Alcohol: denies Tobacco Use: cigarrtte 1-2 day Rehab History: denies H/O Complicated Withdrawal: n.a   Legal History: Past Charges/Incarcerations: no Pending charges: no   Family Psychiatric History: denies     PHQ 2-9:  Flowsheet Row ED from 11/30/2020 in [facility] EMERGENCY DEPARTMENT  Thoughts that you would be better off dead, or of hurting yourself in some way Nearly every day  PHQ-9 Total Score 26       Flowsheet Row ED from 02/12/2021 in [facility] Most recent reading at 02/12/2021  3:51 PM ED from 02/12/2021 in [facility] Most recent reading at 02/12/2021  3:35 PM ED from 02/08/2021 in [facility] Most recent reading at 02/08/2021 10:01 AM  C-SSRS RISK CATEGORY High Risk High Risk Low Risk        Total Time spent with patient: 45 minutes  Musculoskeletal  Strength & Muscle Tone: within normal limits Gait & Station: normal Patient leans: N/A  Psychiatric Specialty Exam  Presentation General Appearance: Appropriate for Environment; Casual  Eye Contact:Fair  Speech:Clear and Coherent; Normal Rate  Speech Volume:Normal  Handedness:Right   Mood and Affect  Mood:Dysphoric; Depressed  Affect:Appropriate; Congruent; Constricted; Other (comment) (dysphoric)   Thought Process  Thought Processes:Coherent; Goal Directed; Linear  Descriptions of Associations:Intact  Orientation:Full (Time, Place and Person)  Thought Content:Logical; WDL  Diagnosis of Schizophrenia or Schizoaffective disorder in past: No   Hallucinations:Hallucinations: None Ideas of Reference:None  Suicidal Thoughts:Suicidal Thoughts: Yes, Active SI Active Intent and/or Plan: Without Intent; Without Plan Homicidal Thoughts:Homicidal Thoughts: No  Sensorium  Memory:Immediate Good; Recent Good; Remote  Good  Judgment:Fair  Insight:Fair   Executive Functions  Concentration:Fair  Attention Span:Good  Recall:Good  Fund of Knowledge:Good  Language:Good   Psychomotor Activity  Psychomotor Activity:Psychomotor Activity: Normal  Assets  Assets:Communication Skills; Desire for Improvement; Housing; Social Support; Resilience; Physical Health   Sleep  Sleep:Sleep: Poor  Nutritional Assessment (For OBS and [name] admissions only) Has the patient had a weight loss or gain of 10 pounds or more in the last 3 months?: Yes Has the patient had a decrease in food intake/or appetite?: No Does the patient have dental problems?: No Does the patient have eating habits or behaviors that may be indicators of an eating disorder including binging or inducing vomiting?: No Has the patient recently lost weight without trying?: Yes, 2-13 lbs. Has the patient been eating poorly because of a decreased appetite?: No Malnutrition Screening Tool Score: 1   Physical Exam Constitutional:      Appearance: Normal appearance. He is normal weight.  HENT:     Head: Normocephalic and atraumatic.  Eyes:     Extraocular Movements: Extraocular movements intact.  Pulmonary:     Effort: Pulmonary effort is normal.  Neurological:     General: No focal deficit present.     Mental Status: He is alert and oriented to person, place, and time.  Psychiatric:        Attention and Perception: Attention and perception normal.  Speech: Speech normal.        Behavior: Behavior normal. Behavior is cooperative.   Review of Systems  Constitutional:  Negative for chills and fever.  HENT:  Negative for hearing loss.   Eyes:  Negative for discharge and redness.  Respiratory:  Negative for cough.   Cardiovascular:  Negative for chest pain.  Gastrointestinal:  Negative for abdominal pain.  Musculoskeletal:  Negative for myalgias.  Neurological:  Negative for headaches.  Psychiatric/Behavioral:  Positive for  depression and suicidal ideas. Negative for hallucinations.    There were no vitals taken for this visit. There is no height or weight on file to calculate BMI.    Is the patient at risk to self? Yes  Has the patient been a risk to self in the past 6 months? Yes .    Has the patient been a risk to self within the distant past? Yes   Is the patient a risk to others? No   Has the patient been a risk to others in the past 6 months? No   Has the patient been a risk to others within the distant past? No   Past Medical History:  Past Medical History:  Diagnosis Date   Drug use     Past Surgical History:  Procedure Laterality Date   SHOULDER SURGERY     SHOULDER SURGERY Left 2020    Family History: No family history on file.  Social History:  Social History   Socioeconomic History   Marital status: Single    Spouse name: Not on file   Number of children: Not on file   Years of education: Not on file   Highest education level: Not on file  Occupational History   Not on file  Tobacco Use   Smoking status: Every Day    Packs/day: 0.25    Years: 1.00    Pack years: 0.25    Types: Cigarettes   Smokeless tobacco: Never  Vaping Use   Vaping Use: Some days   Start date: 11/04/2018   Devices: Jewel Menthol  Substance and Sexual Activity   Alcohol use: Yes    Alcohol/week: 4.0 - 6.0 standard drinks    Types: 4 - 6 Cans of beer per week    Comment: 2-3 times weekly   Drug use: Not Currently    Types: IV, Methamphetamines    Comment: using 1gm everyday heroin   Sexual activity: Yes  Other Topics Concern   Not on file  Social History Narrative   Not on file   Social Determinants of Health   Financial Resource Strain: Not on file  Food Insecurity: Not on file  Transportation Needs: Not on file  Physical Activity: Not on file  Stress: Not on file  Social Connections: Not on file  Intimate Partner Violence: Not on file    SDOH:  SDOH Screenings   Alcohol Screen: Low  Risk    Last Alcohol Screening Score (AUDIT): 7  Depression (PHQ2-9): Medium Risk   PHQ-2 Score: 26  Financial Resource Strain: Not on file  Food Insecurity: Not on file  Housing: Not on file  Physical Activity: Not on file  Social Connections: Not on file  Stress: Not on file  Tobacco Use: High Risk   Smoking Tobacco Use: Every Day   Smokeless Tobacco Use: Never  Transportation Needs: Not on file    Last Labs:  Admission on 02/12/2021, Discharged on 02/12/2021  Component Date Value Ref Range Status   SARS  Coronavirus 2 by RT PCR 02/12/2021 NEGATIVE  NEGATIVE Final   Comment: (NOTE) SARS-CoV-2 target nucleic acids are NOT DETECTED.  The SARS-CoV-2 RNA is generally detectable in upper respiratory specimens during the acute phase of infection. The lowest concentration of SARS-CoV-2 viral copies this assay can detect is 138 copies/mL. A negative result does not preclude SARS-Cov-2 infection and should not be used as the sole basis for treatment or other patient management decisions. A negative result may occur with  improper specimen collection/handling, submission of specimen other than nasopharyngeal swab, presence of viral mutation(s) within the areas targeted by this assay, and inadequate number of viral copies(<138 copies/mL). A negative result must be combined with clinical observations, patient history, and epidemiological information. The expected result is Negative.  Fact Sheet for Patients:  BloggerCourse.com  Fact Sheet for Healthcare Providers:  SeriousBroker.it  This test is no                          t yet approved or cleared by the [place] FDA and  has been authorized for detection and/or diagnosis of SARS-CoV-2 by FDA under an Emergency Use Authorization (EUA). This EUA will remain  in effect (meaning this test can be used) for the duration of the COVID-19 declaration under Section 564(b)(1) of the  Act, 21 U.S.C.section 360bbb-3(b)(1), unless the authorization is terminated  or revoked sooner.       Influenza A by PCR 02/12/2021 NEGATIVE  NEGATIVE Final   Influenza B by PCR 02/12/2021 NEGATIVE  NEGATIVE Final   Comment: (NOTE) The Xpert Xpress SARS-CoV-2/FLU/RSV plus assay is intended as an aid in the diagnosis of influenza from Nasopharyngeal swab specimens and should not be used as a sole basis for treatment. Nasal washings and aspirates are unacceptable for Xpert Xpress SARS-CoV-2/FLU/RSV testing.  Fact Sheet for Patients: BloggerCourse.com  Fact Sheet for Healthcare Providers: SeriousBroker.it  This test is not yet approved or cleared by the [place] FDA and has been authorized for detection and/or diagnosis of SARS-CoV-2 by FDA under an Emergency Use Authorization (EUA). This EUA will remain in effect (meaning this test can be used) for the duration of the COVID-19 declaration under Section 564(b)(1) of the Act, 21 U.S.C. section 360bbb-3(b)(1), unless the authorization is terminated or revoked.  Performed at [facility], [address] [place], [place] [address]    Sodium 02/12/2021 135  135 - 145 mmol/L Final   Potassium 02/12/2021 4.1  3.5 - 5.1 mmol/L Final   Chloride 02/12/2021 102  98 - 111 mmol/L Final   CO2 02/12/2021 28  22 - 32 mmol/L Final   Glucose, Bld 02/12/2021 92  70 - 99 mg/dL Final   Glucose reference range applies only to samples taken after fasting for at least 8 hours.   BUN 02/12/2021 13  6 - 20 mg/dL Final   Creatinine, Ser 02/12/2021 0.56 (A) 0.61 - 1.24 mg/dL Final   Calcium 19/14/7829 8.9  8.9 - 10.3 mg/dL Final   Total Protein 56/21/3086 6.7  6.5 - 8.1 g/dL Final   Albumin 57/84/6962 3.9  3.5 - 5.0 g/dL Final   AST 95/28/4132 19  15 - 41 U/L Final   ALT 02/12/2021 15  0 - 44 U/L Final   Alkaline Phosphatase 02/12/2021 75  38 - 126 U/L Final   Total Bilirubin  02/12/2021 0.6  0.3 - 1.2 mg/dL Final   GFR, Estimated 02/12/2021 >60  >60 mL/min Final  Comment: (NOTE) Calculated using the CKD-EPI Creatinine Equation (2021)    Anion gap 02/12/2021 5  5 - 15 Final   Performed at [facility], [address], [place], [place] [address]   Alcohol, Ethyl (B) 02/12/2021 <10  <10 mg/dL Final   Comment: (NOTE) Lowest detectable limit for serum alcohol is 10 mg/dL.  For medical purposes only. Performed at [facility], [address], [place], [place] [address]    WBC 02/12/2021 7.1  4.0 - 10.5 K/uL Final   RBC 02/12/2021 4.68  4.22 - 5.81 MIL/uL Final   Hemoglobin 02/12/2021 13.4  13.0 - 17.0 g/dL Final   HCT 09/81/1914 40.7  39.0 - 52.0 % Final   MCV 02/12/2021 87.0  80.0 - 100.0 fL Final   MCH 02/12/2021 28.6  26.0 - 34.0 pg Final   MCHC 02/12/2021 32.9  30.0 - 36.0 g/dL Final   RDW 78/29/5621 13.0  11.5 - 15.5 % Final   Platelets 02/12/2021 263  150 - 400 K/uL Final   nRBC 02/12/2021 0.0  0.0 - 0.2 % Final   Neutrophils Relative % 02/12/2021 73  % Final   Neutro Abs 02/12/2021 5.2  1.7 - 7.7 K/uL Final   Lymphocytes Relative 02/12/2021 18  % Final   Lymphs Abs 02/12/2021 1.3  0.7 - 4.0 K/uL Final   Monocytes Relative 02/12/2021 5  % Final   Monocytes Absolute 02/12/2021 0.4  0.1 - 1.0 K/uL Final   Eosinophils Relative 02/12/2021 3  % Final   Eosinophils Absolute 02/12/2021 0.2  0.0 - 0.5 K/uL Final   Basophils Relative 02/12/2021 1  % Final   Basophils Absolute 02/12/2021 0.1  0.0 - 0.1 K/uL Final   Immature Granulocytes 02/12/2021 0  % Final   Abs Immature Granulocytes 02/12/2021 0.03  0.00 - 0.07 K/uL Final   Performed at [facility], [address], [place], [place] [address]   Salicylate Lvl 02/12/2021 <7.0 (A) 7.0 - 30.0 mg/dL Final   Performed at [facility], [address], [place], [place] [address]   Acetaminophen (Tylenol), Serum 02/12/2021 <10 (A) 10 -  30 ug/mL Final   Comment: (NOTE) Therapeutic concentrations vary significantly. A range of 10-30 ug/mL  may be an effective concentration for many patients. However, some  are best treated at concentrations outside of this range. Acetaminophen concentrations >150 ug/mL at 4 hours after ingestion  and >50 ug/mL at 12 hours after ingestion are often associated with  toxic reactions.  Performed at [facility], [address], [place], [place] [address]   Admission on 02/08/2021, Discharged on 02/10/2021  Component Date Value Ref Range Status   SARS Coronavirus 2 by RT PCR 02/08/2021 NEGATIVE  NEGATIVE Final   Comment: (NOTE) SARS-CoV-2 target nucleic acids are NOT DETECTED.  The SARS-CoV-2 RNA is generally detectable in upper respiratory specimens during the acute phase of infection. The lowest concentration of SARS-CoV-2 viral copies this assay can detect is 138 copies/mL. A negative result does not preclude SARS-Cov-2 infection and should not be used as the sole basis for treatment or other patient management decisions. A negative result may occur with  improper specimen collection/handling, submission of specimen other than nasopharyngeal swab, presence of viral mutation(s) within the areas targeted by this assay, and inadequate number of viral copies(<138 copies/mL). A negative result must be combined with clinical observations, patient history, and epidemiological information. The expected result is Negative.  Fact Sheet for Patients:  BloggerCourse.com  Fact Sheet for Healthcare Providers:  SeriousBroker.it  This test is no                          t yet approved or cleared by the [place] FDA and  has been authorized for detection and/or diagnosis of SARS-CoV-2 by FDA under an Emergency Use Authorization (EUA). This EUA will remain  in effect (meaning this test can be used) for the duration of  the COVID-19 declaration under Section 564(b)(1) of the Act, 21 U.S.C.section 360bbb-3(b)(1), unless the authorization is terminated  or revoked sooner.       Influenza A by PCR 02/08/2021 NEGATIVE  NEGATIVE Final   Influenza B by PCR 02/08/2021 NEGATIVE  NEGATIVE Final   Comment: (NOTE) The Xpert Xpress SARS-CoV-2/FLU/RSV plus assay is intended as an aid in the diagnosis of influenza from Nasopharyngeal swab specimens and should not be used as a sole basis for treatment. Nasal washings and aspirates are unacceptable for Xpert Xpress SARS-CoV-2/FLU/RSV testing.  Fact Sheet for Patients: BloggerCourse.com  Fact Sheet for Healthcare Providers: SeriousBroker.it  This test is not yet approved or cleared by the [place] FDA and has been authorized for detection and/or diagnosis of SARS-CoV-2 by FDA under an Emergency Use Authorization (EUA). This EUA will remain in effect (meaning this test can be used) for the duration of the COVID-19 declaration under Section 564(b)(1) of the Act, 21 U.S.C. section 360bbb-3(b)(1), unless the authorization is terminated or revoked.  Performed at [facility], [address], [place], [place] [address]    WBC 02/08/2021 6.5  4.0 - 10.5 K/uL Final   RBC 02/08/2021 5.15  4.22 - 5.81 MIL/uL Final   Hemoglobin 02/08/2021 14.9  13.0 - 17.0 g/dL Final   HCT 60/45/4098 44.9  39.0 - 52.0 % Final   MCV 02/08/2021 87.2  80.0 - 100.0 fL Final   MCH 02/08/2021 28.9  26.0 - 34.0 pg Final   MCHC 02/08/2021 33.2  30.0 - 36.0 g/dL Final   RDW 11/91/4782 13.0  11.5 - 15.5 % Final   Platelets 02/08/2021 330  150 - 400 K/uL Final   nRBC 02/08/2021 0.0  0.0 - 0.2 % Final   Neutrophils Relative % 02/08/2021 69  % Final   Neutro Abs 02/08/2021 4.5  1.7 - 7.7 K/uL Final   Lymphocytes Relative 02/08/2021 20  % Final   Lymphs Abs 02/08/2021 1.3  0.7 - 4.0 K/uL Final   Monocytes Relative 02/08/2021 7  %  Final   Monocytes Absolute 02/08/2021 0.4  0.1 - 1.0 K/uL Final   Eosinophils Relative 02/08/2021 3  % Final   Eosinophils Absolute 02/08/2021 0.2  0.0 - 0.5 K/uL Final   Basophils Relative 02/08/2021 0  % Final   Basophils Absolute 02/08/2021 0.0  0.0 - 0.1 K/uL Final   Immature Granulocytes 02/08/2021 1  % Final   Abs Immature Granulocytes 02/08/2021 0.05  0.00 - 0.07 K/uL Final   Performed at [facility], [address] [place], [place] [address]   Sodium 02/08/2021 137  135 - 145 mmol/L Final   Potassium 02/08/2021 4.4  3.5 - 5.1 mmol/L Final   Chloride 02/08/2021 101  98 - 111 mmol/L Final   CO2 02/08/2021 26  22 - 32 mmol/L Final   Glucose, Bld 02/08/2021 75  70 - 99 mg/dL Final   Glucose reference range applies only to samples taken after fasting for at least  8 hours.   BUN 02/08/2021 12  6 - 20 mg/dL Final   Creatinine, Ser 02/08/2021 0.83  0.61 - 1.24 mg/dL Final   Calcium 47/42/5956 9.9  8.9 - 10.3 mg/dL Final   Total Protein 38/75/6433 6.9  6.5 - 8.1 g/dL Final   Albumin 29/51/8841 4.0  3.5 - 5.0 g/dL Final   AST 66/12/3014 19  15 - 41 U/L Final   ALT 02/08/2021 19  0 - 44 U/L Final   Alkaline Phosphatase 02/08/2021 87  38 - 126 U/L Final   Total Bilirubin 02/08/2021 0.4  0.3 - 1.2 mg/dL Final   GFR, Estimated 02/08/2021 >60  >60 mL/min Final   Comment: (NOTE) Calculated using the CKD-EPI Creatinine Equation (2021)    Anion gap 02/08/2021 10  5 - 15 Final   Performed at [facility], [address], [place], [place] [address]   Alcohol, Ethyl (B) 02/08/2021 <10  <10 mg/dL Final   Comment: (NOTE) Lowest detectable limit for serum alcohol is 10 mg/dL.  For medical purposes only. Performed at [facility], [address], [place], [place] [address]    POC Amphetamine UR 02/08/2021 None Detected  NONE DETECTED (Cut Off Level 1000 ng/mL) Final   POC Secobarbital (BAR) 02/08/2021 None Detected  NONE DETECTED (Cut Off Level 300 ng/mL) Final   POC  Buprenorphine (BUP) 02/08/2021 None Detected  NONE DETECTED (Cut Off Level 10 ng/mL) Final   POC Oxazepam (BZO) 02/08/2021 None Detected  NONE DETECTED (Cut Off Level 300 ng/mL) Final   POC Cocaine UR 02/08/2021 None Detected  NONE DETECTED (Cut Off Level 300 ng/mL) Final   POC Methamphetamine UR 02/08/2021 None Detected  NONE DETECTED (Cut Off Level 1000 ng/mL) Final   POC Morphine 02/08/2021 None Detected  NONE DETECTED (Cut Off Level 300 ng/mL) Final   POC Oxycodone UR 02/08/2021 None Detected  NONE DETECTED (Cut Off Level 100 ng/mL) Final   POC Methadone UR 02/08/2021 None Detected  NONE DETECTED (Cut Off Level 300 ng/mL) Final   POC Marijuana UR 02/08/2021 None Detected  NONE DETECTED (Cut Off Level 50 ng/mL) Final   SARSCOV2ONAVIRUS 2 AG 02/08/2021 NEGATIVE  NEGATIVE Final   Comment: (NOTE) SARS-CoV-2 antigen NOT DETECTED.   Negative results are presumptive.  Negative results do not preclude SARS-CoV-2 infection and should not be used as the sole basis for treatment or other patient management decisions, including infection  control decisions, particularly in the presence of clinical signs and  symptoms consistent with COVID-19, or in those who have been in contact with the virus.  Negative results must be combined with clinical observations, patient history, and epidemiological information. The expected result is Negative.  Fact Sheet for Patients: https://www.jennings-kim.com/  Fact Sheet for Healthcare Providers: https://alexander-rogers.biz/  This test is not yet approved or cleared by the [place] FDA and  has been authorized for detection and/or diagnosis of SARS-CoV-2 by FDA under an Emergency Use Authorization (EUA).  This EUA will remain in effect (meaning this test can be used) for the duration of  the COV                          ID-19 declaration under Section 564(b)(1) of the Act, 21 U.S.C. section 360bbb-3(b)(1), unless the  authorization is terminated or revoked sooner.     SARS Coronavirus 2 Ag 02/08/2021 Negative  Negative Final   SARSCOV2ONAVIRUS 2 AG 02/08/2021 NEGATIVE  NEGATIVE Final   Comment: (  NOTE) SARS-CoV-2 antigen NOT DETECTED.   Negative results are presumptive.  Negative results do not preclude SARS-CoV-2 infection and should not be used as the sole basis for treatment or other patient management decisions, including infection  control decisions, particularly in the presence of clinical signs and  symptoms consistent with COVID-19, or in those who have been in contact with the virus.  Negative results must be combined with clinical observations, patient history, and epidemiological information. The expected result is Negative.  Fact Sheet for Patients: https://www.jennings-kim.com/  Fact Sheet for Healthcare Providers: https://alexander-rogers.biz/  This test is not yet approved or cleared by the [place] FDA and  has been authorized for detection and/or diagnosis of SARS-CoV-2 by FDA under an Emergency Use Authorization (EUA).  This EUA will remain in effect (meaning this test can be used) for the duration of  the COV                          ID-19 declaration under Section 564(b)(1) of the Act, 21 U.S.C. section 360bbb-3(b)(1), unless the authorization is terminated or revoked sooner.    Admission on 11/30/2020, Discharged on 12/04/2020  Component Date Value Ref Range Status   Hepatitis B Surface Ag 12/01/2020 NON REACTIVE  NON REACTIVE Final   HCV Ab 12/01/2020 Reactive (A) NON REACTIVE Final   Comment: (NOTE) The CDC recommends that a Reactive HCV antibody result be followed up  with a HCV Nucleic Acid Amplification test.     Hep A IgM 12/01/2020 See Scanned report in [place] [facility] (A) NON REACTIVE Final   Performed at [facility]   Hep B C IgM 12/01/2020 NON REACTIVE  NON REACTIVE Final   Performed at [facility], [address] [place], [place] [address]   Sodium 12/01/2020 137  135 - 145 mmol/L Final   Potassium 12/01/2020 4.8  3.5 - 5.1 mmol/L Final   Chloride 12/01/2020 102  98 - 111 mmol/L Final   CO2 12/01/2020 25  22 - 32 mmol/L Final   Glucose, Bld 12/01/2020 132 (A) 70 - 99 mg/dL Final   Glucose reference range applies only to samples taken after fasting for at least 8 hours.   BUN 12/01/2020 15  6 - 20 mg/dL Final   Creatinine, Ser 12/01/2020 0.80  0.61 - 1.24 mg/dL Final   Calcium 84/13/2440 9.7  8.9 - 10.3 mg/dL Final   Total Protein 05/02/2535 7.9  6.5 - 8.1 g/dL Final   Albumin 64/40/3474 4.5  3.5 - 5.0 g/dL Final   AST 25/95/6387 59 (A) 15 - 41 U/L Final   ALT 12/01/2020 93 (A) 0 - 44 U/L Final   Alkaline Phosphatase 12/01/2020 77  38 - 126 U/L Final   Total Bilirubin 12/01/2020 0.6  0.3 - 1.2 mg/dL Final   GFR, Estimated 12/01/2020 >60  >60 mL/min Final   Comment: (NOTE) Calculated using the CKD-EPI Creatinine Equation (2021)    Anion gap 12/01/2020 10  5 - 15 Final   Performed at [facility], [address], [place], [place] [address]   TSH 12/01/2020 0.276 (A) 0.350 - 4.500 uIU/mL Final   Comment: Performed by a 3rd Generation assay with a functional sensitivity of <=0.01 uIU/mL. Performed at [facility], [address] [place], [place] [address]    Free T4 12/03/2020 0.76  0.61 - 1.12 ng/dL Final   Comment: (NOTE) Biotin ingestion may interfere with free T4 tests.  If the results are inconsistent with the TSH level, previous test results, or the clinical presentation, then consider biotin interference. If needed, order repeat testing after stopping biotin. Performed at [facility], [address] [place], [place] [address]    T3, Free 12/03/2020 2.5  2.0 - 4.4 pg/mL Final   Comment: (NOTE) Performed At: [facility] [address] [place], [place] [id number] Jolene Schimke MD [id number]:[id number]    Cholesterol 12/03/2020 174  0 -  200 mg/dL Final   Triglycerides 64/33/2951 84  <150 mg/dL Final   HDL 88/41/6606 61  >40 mg/dL Final   Total CHOL/HDL Ratio 12/03/2020 2.9  RATIO Final   VLDL 12/03/2020 17  0 - 40 mg/dL Final   LDL Cholesterol 12/03/2020 96  0 - 99 mg/dL Final   Comment:        Total Cholesterol/HDL:CHD Risk Coronary Heart Disease Risk Table                     Men   Women  1/2 Average Risk   3.4   3.3  Average Risk       5.0   4.4  2 X Average Risk   9.6   7.1  3 X Average Risk  23.4   11.0        Use the calculated Patient Ratio above and the CHD Risk Table to determine the patient's CHD Risk.        ATP III CLASSIFICATION (LDL):  <100     mg/dL   Optimal  301-601  mg/dL   Near or Above                    Optimal  130-159  mg/dL   Borderline  093-235  mg/dL   High  >573     mg/dL   Very High Performed at [facility], [address], [place], [place] [address]    HCV Quantitative 12/03/2020 68,900  >50 IU/mL Final   HCV Quantitative Log 12/03/2020 4.838  >1.70 log10 IU/mL Final   Test Information 12/03/2020 Comment   Final   Comment: (NOTE) The quantitative range of this assay is 15 IU/mL to 100 million IU/mL. Performed At: [facility] [address] [place], [place] [id number] Jolene Schimke MD [id number]    Total Protein 12/03/2020 7.1  6.5 - 8.1 g/dL Final   Albumin 37/04/6268 4.3  3.5 - 5.0 g/dL Final   AST 48/54/6270 49 (A) 15 - 41 U/L Final   ALT 12/03/2020 89 (A) 0 - 44 U/L Final   Alkaline Phosphatase 12/03/2020 68  38 - 126 U/L Final   Total Bilirubin 12/03/2020 0.5  0.3 - 1.2 mg/dL Final   Bilirubin, Direct 12/03/2020 <0.1  0.0 - 0.2 mg/dL Final   Indirect Bilirubin 12/03/2020 NOT CALCULATED  0.3 - 0.9 mg/dL Final   Performed at [facility], [address] [place], [place] [address]   Hgb A1c MFr Bld 12/03/2020 5.3  4.8 - 5.6 % Final   Comment: (NOTE)         Prediabetes: 5.7 - 6.4         Diabetes: >6.4          Glycemic control for adults with diabetes: <7.0    Mean Plasma Glucose 12/03/2020 105  mg/dL Final   Comment: (NOTE) Performed At: [facility] [address] [place], [place] [id number] Jolene Schimke MD [id number]:[id number]    Hepatitis C  Quantitation 12/01/2020 27,600  IU/mL Final   HCV log10 12/01/2020 4.441  log10 IU/mL Final   Test Information (HCV): 12/01/2020 Comment   Final   Comment: (NOTE) The quantitative range of this assay is 15 IU/mL to 100 million IU/mL.    Interpretation (HCV): 12/01/2020 Comment   Final   Comment: (NOTE) Positive HCV antibody screen with the presence of HCV RNA is consistent with active infection. Performed At: [facility] [address] [place], [place] [id number] Jolene Schimke MD [id number]:[id number]   Admission on 11/30/2020, Discharged on 11/30/2020  Component Date Value Ref Range Status   Sodium 11/30/2020 133 (A) 135 - 145 mmol/L Final   Potassium 11/30/2020 3.6  3.5 - 5.1 mmol/L Final   Chloride 11/30/2020 100  98 - 111 mmol/L Final   CO2 11/30/2020 24  22 - 32 mmol/L Final   Glucose, Bld 11/30/2020 106 (A) 70 - 99 mg/dL Final   Glucose reference range applies only to samples taken after fasting for at least 8 hours.   BUN 11/30/2020 13  6 - 20 mg/dL Final   Creatinine, Ser 11/30/2020 0.94  0.61 - 1.24 mg/dL Final   Calcium 59/56/3875 9.4  8.9 - 10.3 mg/dL Final   Total Protein 64/33/2951 6.4 (A) 6.5 - 8.1 g/dL Final   Albumin 88/41/6606 3.9  3.5 - 5.0 g/dL Final   AST 30/16/0109 34  15 - 41 U/L Final   ALT 11/30/2020 58 (A) 0 - 44 U/L Final   Alkaline Phosphatase 11/30/2020 64  38 - 126 U/L Final   Total Bilirubin 11/30/2020 0.5  0.3 - 1.2 mg/dL Final   GFR, Estimated 11/30/2020 >60  >60 mL/min Final   Comment: (NOTE) Calculated using the CKD-EPI Creatinine Equation (2021)    Anion gap 11/30/2020 9  5 - 15 Final   Performed at [facility], [address] [place], [place] [address]   Alcohol, Ethyl (B) 11/30/2020 <10   <10 mg/dL Final   Comment: (NOTE) Lowest detectable limit for serum alcohol is 10 mg/dL.  For medical purposes only. Performed at [facility], [address] [place], [place] [address]    Salicylate Lvl 11/30/2020 <7.0 (A) 7.0 - 30.0 mg/dL Final   Performed at [facility], [address] [place], [place] [address]   Acetaminophen (Tylenol), Serum 11/30/2020 <10 (A) 10 - 30 ug/mL Final   Comment: (NOTE) Therapeutic concentrations vary significantly. A range of 10-30 ug/mL  may be an effective concentration for many patients. However, some  are best treated at concentrations outside of this range. Acetaminophen concentrations >150 ug/mL at 4 hours after ingestion  and >50 ug/mL at 12 hours after ingestion are often associated with  toxic reactions.  Performed at [facility], [address], [place], [place] [address]    WBC 11/30/2020 8.6  4.0 - 10.5 K/uL Final   RBC 11/30/2020 4.26  4.22 - 5.81 MIL/uL Final   Hemoglobin 11/30/2020 12.5 (A) 13.0 - 17.0 g/dL Final   HCT 62/83/1517 37.7 (A) 39.0 - 52.0 % Final   MCV 11/30/2020 88.5  80.0 - 100.0 fL Final   MCH 11/30/2020 29.3  26.0 - 34.0 pg Final   MCHC 11/30/2020 33.2  30.0 - 36.0 g/dL Final   RDW 61/60/7371 13.1  11.5 - 15.5 % Final   Platelets 11/30/2020 220  150 - 400 K/uL Final   nRBC 11/30/2020 0.0  0.0 - 0.2 % Final   Performed at [facility], [address] [place], [place] [address]   Opiates 11/30/2020 POSITIVE (A) NONE DETECTED Final   Cocaine 11/30/2020 NONE DETECTED  NONE DETECTED Final   Benzodiazepines 11/30/2020 NONE DETECTED  NONE DETECTED Final   Amphetamines 11/30/2020 NONE DETECTED  NONE DETECTED Final   Tetrahydrocannabinol 11/30/2020 NONE DETECTED  NONE DETECTED Final   Barbiturates 11/30/2020 NONE DETECTED  NONE DETECTED Final   Comment: (NOTE) DRUG SCREEN FOR MEDICAL PURPOSES ONLY.  IF CONFIRMATION IS NEEDED FOR ANY PURPOSE, NOTIFY LAB WITHIN 5 DAYS.  LOWEST DETECTABLE  LIMITS FOR URINE DRUG SCREEN Drug Class                     Cutoff (ng/mL) Amphetamine and metabolites    1000 Barbiturate and metabolites    200 Benzodiazepine                 200 Tricyclics and metabolites     300 Opiates and metabolites        300 Cocaine and metabolites        300 THC                            50 Performed at [facility], [address], [place], [place] [address]    SARS Coronavirus 2 by RT PCR 11/30/2020 NEGATIVE  NEGATIVE Final   Comment: (NOTE) SARS-CoV-2 target nucleic acids are NOT DETECTED.  The SARS-CoV-2 RNA is generally detectable in upper respiratory specimens during the acute phase of infection. The lowest concentration of SARS-CoV-2 viral copies this assay can detect is 138 copies/mL. A negative result does not preclude SARS-Cov-2 infection and should not be used as the sole basis for treatment or other patient management decisions. A negative result may occur with  improper specimen collection/handling, submission of specimen other than nasopharyngeal swab, presence of viral mutation(s) within the areas targeted by this assay, and inadequate number of viral copies(<138 copies/mL). A negative result must be combined with clinical observations, patient history, and epidemiological information. The expected result is Negative.  Fact Sheet for Patients:  BloggerCourse.com  Fact Sheet for Healthcare Providers:  SeriousBroker.it  This test is no                          t yet approved or cleared by the [place] FDA and  has been authorized for detection and/or diagnosis of SARS-CoV-2 by FDA under an Emergency Use Authorization (EUA). This EUA will remain  in effect (meaning this test can be used) for the duration of the COVID-19 declaration under Section 564(b)(1) of the Act, 21 U.S.C.section 360bbb-3(b)(1), unless the authorization is terminated  or revoked sooner.        Influenza A by PCR 11/30/2020 NEGATIVE  NEGATIVE Final   Influenza B by PCR 11/30/2020 NEGATIVE  NEGATIVE Final   Comment: (NOTE) The Xpert Xpress SARS-CoV-2/FLU/RSV plus assay is intended as an aid in the diagnosis of influenza from Nasopharyngeal swab specimens and should not be used as a sole basis for treatment. Nasal washings and aspirates are unacceptable for Xpert Xpress SARS-CoV-2/FLU/RSV testing.  Fact Sheet for Patients: BloggerCourse.com  Fact Sheet for Healthcare Providers: SeriousBroker.it  This test is not yet approved or cleared by the [place] FDA and has been authorized for detection and/or diagnosis of SARS-CoV-2 by FDA under an Emergency Use Authorization (EUA). This EUA will remain in effect (meaning this  test can be used) for the duration of the COVID-19 declaration under Section 564(b)(1) of the Act, 21 U.S.C. section 360bbb-3(b)(1), unless the authorization is terminated or revoked.  Performed at [facility], [address] [place], [place] [address]     Allergies: Patient has no known allergies.  PTA Medications: (Not in a hospital admission)   Medical Decision Making  Patient presented to the [facility] ED with SI; reported SI with no intention and no plan but described beng on top of a parking garage yesterday drinking andhad thoughts of suicide. Patient unable to ontract for safety and currently expressing SI- he appears objectively depressed. He is appropriate for admission to the [facility] at this time. He expresses desire to restart effexor 37.5 mg-he reported compliance since leaving the [facility] on 8/8 although denies taking today.  Patient with labs drawn in ED- CBC with mildly low creatinine, CBC wnl, etoh neg.  recent TSH 0.276 (mildly low) but t3 and t4 wnl (11/2020) a1c 5.3 (11/2020) lipid panel unremarkable (11/2020)  Will order UDS that was not collected prior to transfer to [facility]       Recommendations  Based on my evaluation the patient does not appear to have an emergency medical condition.  Estella Husk, MD 02/12/21  4:30 PM

## 2021-02-12 NOTE — ED Provider Notes (Signed)
[facility] Admission Suicide Risk Assessment   Mental Status Per Nursing Assessment::  per chart On Admission:   reporting SI without a plan or intent  Demographic Factors:  Male, Caucasian, Low socioeconomic status, Living alone, and Unemployed  Loss Factors: Decrease in vocational status, Loss of significant relationship, and Financial problems/change in socioeconomic status  Historical Factors: Anniversary of important loss and Impulsivity  Risk Reduction Factors:   Positive social support and Positive therapeutic relationship   Total Time spent with patient: 45 minutes Principal Problem: MDD (major depressive disorder), recurrent episode, severe (HCC) Diagnosis:  Principal Problem:   MDD (major depressive disorder), recurrent episode, severe (HCC)  Subjective Data:    Per [facility] provider Patient is a 30 year old male with past medical history of major depressive disorder and substance disorder presenting for complaints of suicidal ideation.  Patient states over the last 2 days had increased suicidal ideation without a plan.  He states his father died on 05-Aug-2022 of this year and that they share the same birthday.  He states this is triggering him to be more depressed.  Any suicidal plan at this time.  Denies any homicidal ideations.  Denies auditory or visual hallucinations.  Denies alcohol or active drug use.  Patient was assessed by TTS and found to meet criteria for [facility]  Per [facility] MD Patient seen and chart reviewed. On chart review,  was recently admitted to the [facility] from 8/6-8/8. At that time he had requested to leave. He was discharged with effexor 37.5 and vistaril 25 mg TID PRN. On 8/7 He states that he was using Roxicet 15 mg every 2 days for 1 month and his last use was one week ago.    Pt states that upon discharge on 8/8, he went home, called mom and realized that he didn't get the help he needed and should have stayed longer at the [facility] instead of requesting discharge. Pt is  intermittently tearful throughout assessment. "I want so bad to get back on track". Says he has not felt "on track" for ~1 yr. ~1 year ago his father died, he withdrew from school, lost his job and apartment and was also having some relationship difficulties. Pt states that his father died "out of nowhere"--worked for [employer] [place] [employer]. Did go to group therapy setting for awhile but wasn't ready to seek therapy and ultimately stopped attending. Patient states that pon discharge 2 days ago he did not register for classes because he realized that he was not ready to go back. 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   He states sthat he was drinking on top of parking garage last night, thought about what people would think if they had heard he died. He states that his sister who recently had a baby called him while he was on the parking garage and urged him to not harm himself as she wants him to meet his niece. He describes hs sister as supportive and states that she is aware of his struggle with depression. He describes his mood as depressed, reports poor sleep, anhedonia (used to enjoy basketball, reading, going out with friends), guilt,  hopelessness, poor energy, concentration difficulties. Overall he reports poor appetite but states that it has improved somewhat although has lost ~15 lbs over the past several months. R reports SI without plan or intent and denies HI/AVH. He is tearful throughout assessment.  Pt expressed that he would  like to continue effexor and reports compliance since discharge. He reports multiple hospitalizations but states that he does not attend follow up appointments and normally discontinues medications when the prescription runs ou   CLINICAL FACTORS:   Depression:   Anhedonia Hopelessness Impulsivity Insomnia Alcohol/Substance Abuse/Dependencies Previous Psychiatric Diagnoses and  Treatments   Musculoskeletal: Strength & Muscle Tone: within normal limits Gait & Station: normal Patient leans: N/A  Psychiatric Specialty Exam:  Presentation  General Appearance: Appropriate for Environment; Casual  Eye Contact:Fair  Speech:Clear and Coherent; Normal Rate  Speech Volume:Normal  Handedness:Right   Mood and Affect  Mood:Dysphoric; Depressed  Affect:Appropriate; Congruent; Constricted; Other (comment) (dysphoric)   Thought Process  Thought Processes:Coherent; Goal Directed; Linear  Descriptions of Associations:Intact  Orientation:Full (Time, Place and Person)  Thought Content:Logical; WDL  History of Schizophrenia/Schizoaffective disorder:No  Duration of Psychotic Symptoms:No data recorded Hallucinations:Hallucinations: None  Ideas of Reference:None  Suicidal Thoughts:Suicidal Thoughts: Yes, Active SI Active Intent and/or Plan: Without Intent; Without Plan  Homicidal Thoughts:Homicidal Thoughts: No   Sensorium  Memory:Immediate Good; Recent Good; Remote Good  Judgment:Fair  Insight:Fair   Executive Functions  Concentration:Fair  Attention Span:Good  Recall:Good  Fund of Knowledge:Good  Language:Good   Psychomotor Activity  Psychomotor Activity:Psychomotor Activity: Normal   Assets  Assets:Communication Skills; Desire for Improvement; Housing; Social Support; Resilience; Physical Health   Sleep  Sleep:Sleep: Poor    Physical Exam: See H&P completed by me earlier today for Physical exam and ROS  There were no vitals taken for this visit. There is no height or weight on file to calculate BMI.   COGNITIVE FEATURES THAT CONTRIBUTE TO RISK:  Thought constriction (tunnel vision)    SUICIDE RISK:   Moderate:  Frequent suicidal ideation with limited intensity, and duration, some specificity in terms of plans, no associated intent, good self-control, limited dysphoria/symptomatology, some risk factors present, and  identifiable protective factors, including available and accessible social support.  PLAN OF CARE:   Patient presented to the [facility] ED with SI; reported SI with no intention and no plan but described beng on top of a parking garage yesterday drinking andhad thoughts of suicide. Patient unable to ontract for safety and currently expressing SI- he appears objectively depressed. He is appropriate for admission to the [facility] at this time. He expresses desire to restart effexor 37.5 mg-he reported compliance since leaving the [facility] on 8/8 although denies taking today.  Patient with labs drawn in ED- CBC with mildly low creatinine, CBC wnl, etoh neg.  recent TSH 0.276 (mildly low) but t3 and t4 wnl (11/2020) a1c 5.3 (11/2020) lipid panel unremarkable (11/2020)  Will order UDS that was not collected prior to transfer to [facility]   I certify that inpatient services furnished can reasonably be expected to improve the patient's condition.   Estella Husk, MD 02/12/2021, 4:36 PM

## 2021-02-12 NOTE — BH Assessment (Addendum)
Comprehensive Clinical Assessment (CCA) Note  02/12/2021 Jared Jordan [id number]  Disposition: TTS completed. Discussed clinicals with Jared Jordan provider, Jared Bodo, RN, whom recommended treatment at the [facility].  [facility] is [facility] to [facility] if medically cleared and COVID results are negative. Accepting provider is Dr. Myra Jordan. Clinician requested patient's nurse to please call nurse report prior to sending patient #[phone] (Other #'s include [phone] or [phone]).Clinician has notified patient's nurse, EDP, and Disposition Counselor Jared Jordan) or patient's disposition as well. Jared Jordan will meet with patient to  sign a Voluntary Admission form prior to transfer. Jared Jordan-SUICIDE SEVERITY RATING SCALE (C-SSRS) completed and patient scored, "High Risk". Therefore, this Clinician recommends 1-1 Sitter Precautions.   The patient demonstrates the following risk factors for suicide: Chronic risk factors for suicide include: MDD (major depressive disorder), recurrent severe, without psychosis (HCC)  . Acute risk factors for suicide include: social withdrawal/isolation and recent discharge from inpatient psychiatry. Protective factors for this patient include:  patient unable to identify any protective factors . Considering these factors, the overall suicide risk at this point appears to be high. Patient is not appropriate for outpatient follow up.   Flowsheet Row ED from 02/12/2021 in [facility] from 02/08/2021 in [facility] from 01/27/2021 in Jared Jordan  C-SSRS RISK CATEGORY High Risk Low Risk No Risk       Jared Jordan is a 30 year old male that presents to Jared Jordan, voluntarily. States that his father passed away earlier this year. They shared a [place] and last week was their Birthdays. States that memories  came back of his father and he has since not felt like himself.   Today, patient reports suicidal ideations, "Every day, for the past month". He is tearful as he states, "I feel like I'm better just not being here anymore". Explains that around 6am, the was at the top of a parking garage with intent to jump off. However, he did not proceed with the suicide attempt because his mother called him by phone. He told his mother that he was contemplating suicide. His mother urged him to not commit suicide and to seek help instead. Patient then walked to the [facility] for help.   Patient reports a history of one suicide attempt by cutting himself. The incident occurred 7 months ago triggered by the death of his father. Patient is not able to identify any self mutilating behaviors. Current depressive symptoms: hopelessness, worthlessness, isolating self from others, guilt, anger, irritability, and insomnia. He sleeps 2-3 hours per night. Appetite is poor. States that he has loss 15 pounds in 6 months.   He does not have a current support systems. States that his family/other lives out of state. Denies history of trauma and/or abuse. Currently lives alone in a apartment. 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 Highest level of education is some college. No family history of mental health illness.   Denies homicidal ideations. Denies history of aggressive and/or assault ive behaviors. Denies legal issues or having any upcoming court dates. Denies AVH's. He has a history of alcohol and drug use. He reports use of opioids (Percoets) and started using January 2022. His average amt of use is 5-10 mg's every 2 days. Last use was 1 week ago. He also reports use of alcohol and started drinking at the age of 30 yrs old. He drinks 3x's per week, 3-4 Spirits. Last  drink was a week ago. Denies history of substance use residential treatment. However, per chart review has [facility]. States that he has a family history of substance use (paternal family).   Upon chart review, patient [facility]. However, he asked to leave after completing 2 days of treatment at the facilty. States, "I thought I was ready to leave but I really wasn't". He did not follow up with any of his discharge instructions. He has presented to the [facility] several other times, mostly ED visits, related to suicidal ideations and/or substance use complaints.    Chief Complaint:  Chief Complaint  Patient presents with   Psychiatric Evaluation   Suicidal   Visit Diagnosis: MDD (major depressive disorder), recurrent severe, without psychosis (HCC) and Substance Use Disorder     CCA Screening, Triage and Referral (STR)  Patient Reported Information How did you hear about us? Self (Pt brought in by EMS.  He called them to  his residence.)  What Is the Reason for Your Visit/Call Today? Suicidal ideations triggered by on-going stressor; grief (father passed away 1 year ago)  How Long Has This Been Causing You Problems? > than 6 months  What Do You Feel Would Help You the Most Today? Treatment for Depression or other mood problem   Have You Recently Had Any Thoughts About Hurting Yourself? Yes  Are You Planning to Commit Suicide/Harm Yourself At This time? Yes   Have you Recently Had Thoughts About Hurting Someone Jared Jordan? No  Are You Planning to Harm Someone at This Time? No  Explanation: No data recorded  Have You Used Any Alcohol or Drugs in the Past 24 Hours? No  How Long Ago Did You Use Drugs or Alcohol? No data recorded What Did You Use and How Much? No data recorded  Do You Currently Have a Therapist/Psychiatrist? No  Name of Therapist/Psychiatrist: No data recorded  Have You Been Recently Discharged From Any Office Practice or Programs? No  Explanation of Discharge From Practice/Program: No data recorded    CCA Screening  Triage Referral Assessment Type of Contact: Face-to-Face  Telemedicine Service Delivery:   Is this Initial or Reassessment? Initial Assessment  Date Telepsych consult ordered in [facility]:  11/30/20  [facility]  Location of Assessment: [facility]  [facility]   Collateral Involvement: no collateral information available   Does Patient Have a Court Appointed Legal Guardian? No data recorded Name and Contact of Legal Guardian: No data recorded If Minor and Not Living with Parent(s), Who has Custody? No data recorded Is CPS involved or ever been involved? Never  Is APS involved or ever been involved? Never   Patient Determined To Be At Risk for Harm To Self or Others Based on Review of Patient Reported Information or Presenting Complaint? Yes, for Self-Harm  Method: No data recorded Availability of Means: No data recorded Intent: No data recorded Notification Required: No data recorded Additional Information for Danger to Others Potential: No data recorded Additional Comments for Danger to Others Potential: No data recorded Are There Guns or Other Weapons in Your Home? No data recorded Types of Guns/Weapons: No data recorded Are These Weapons Safely Secured?                            No data recorded Who Could Verify You Are Able  To Have These Secured: No data recorded Do You Have any Outstanding Charges, Pending Court Dates, Parole/Probation? No data recorded Contacted To Inform of Risk of Harm To Self or Others: Unable to Contact:    Does Patient Present under Involuntary Commitment? No  IVC Papers Initial File Date: No data recorded  [place] of Residence: [place]   Patient Currently Receiving the Following Services: -- (Patient has no sevices in place at this time.)   Determination of Need: Emergent (2 hours)   Options For Referral: Other: Comment ([facility])     CCA  Biopsychosocial Patient Reported Schizophrenia/Schizoaffective Diagnosis in Past: No   Strengths: Patient states that he is good in business   Mental Health Symptoms Depression:   Change in energy/activity; Difficulty Concentrating; Fatigue; Hopelessness; Tearfulness; Sleep (too much or little); Increase/decrease in appetite; Worthlessness   Duration of Depressive symptoms:    Mania:   Increased Energy; Racing thoughts   Anxiety:    Difficulty concentrating; Restlessness; Sleep; Tension; Worrying   Psychosis:   None   Duration of Psychotic symptoms:    Trauma:   None   Obsessions:   Disrupts routine/functioning   Compulsions:   None   Inattention:   None   Hyperactivity/Impulsivity:   N/A   Oppositional/Defiant Behaviors:   None   Emotional Irregularity:   Potentially harmful impulsivity; Intense/unstable relationships; Mood lability; Chronic feelings of emptiness   Other Mood/Personality Symptoms:  No data recorded   Mental Status Exam Appearance and self-care  Stature:   Average   Weight:   Average weight   Clothing:   Casual; Neat/clean   Grooming:   Normal   Cosmetic use:   None   Posture/gait:   Normal   Motor activity:   Not Remarkable   Sensorium  Attention:   Normal   Concentration:   Anxiety interferes   Orientation:   Object; Person; Place; Situation; Time   Recall/memory:   Normal   Affect and Mood  Affect:   Depressed; Labile; Tearful   Mood:   Depressed; Hopeless   Relating  Eye contact:   Normal   Facial expression:   Depressed; Anxious; Sad   Attitude toward examiner:   Cooperative   Thought and Language  Speech flow:  Clear and Coherent   Thought content:   Appropriate to Mood and Circumstances   Preoccupation:   None   Hallucinations:   None   Organization:  No data recorded  [employer] of Knowledge:   Average   Intelligence:   Average   Abstraction:   Functional    Judgement:   Fair   Dance movement psychotherapist:   Realistic   Insight:   Lacking   Decision Making:   Impulsive   Social Functioning  Social Maturity:   Impulsive   Social Judgement:   Normal   Stress  Stressors:   Relationship; Financial; Family conflict; Transitions; Work   Coping Ability:   Deficient supports   Neurosurgeon:   Interpersonal   Supports:   Support needed     Religion: Religion/Spirituality Are You A Religious Person?: No How Might This Affect Treatment?: not assessed  Leisure/Recreation: Leisure / Recreation Do You Have Hobbies?: Yes Leisure and Hobbies: "Nothing currently" Patient states that he used to lie hockey  Exercise/Diet: Exercise/Diet Do You Exercise?: No Have You Gained or Lost A Significant Amount of Weight in the Past Six Months?: Yes-Lost Number of Pounds Lost?: 20 Do You Follow a Special Diet?: No Do  You Have Any Trouble Sleeping?: Yes Explanation of Sleeping Difficulties: Gets <4H/D   CCA Employment/Education Employment/Work Situation: Employment / Work Situation Employment Situation: Surveyor, minerals Job has Been Impacted by Current Illness: No Has Patient ever Been in the [employer]?: No Did You Receive Any Psychiatric Treatment/Services While in the [employer]?: No  Education: Education Is Patient Currently Attending School?: Yes School Currently Attending: Pt is a Health and safety inspector at [employer] Last Grade Completed: 12 Did You Product manager?: Yes What Type of College Degree Do you Have?: Patient has attended [employer] Did You Have An Individualized Education Program (IIEP): No Did You Have Any Difficulty At School?: No Patient's Education Has Been Impacted by Current Illness: No   CCA Family/Childhood History Family and Relationship History: Family history Marital status: Single Does patient have children?: No  Childhood History:  Childhood History By whom was/is the patient raised?: Both parents Did patient suffer  any verbal/emotional/physical/sexual abuse as a child?: No Did patient suffer from severe childhood neglect?: No Has patient ever been sexually abused/assaulted/raped as an adolescent or adult?: No Was the patient ever a victim of a crime or a disaster?: No Witnessed domestic violence?: No Has patient been affected by domestic violence as an adult?: No  Child/Adolescent Assessment:     CCA Substance Use Alcohol/Drug Use: Alcohol / Drug Use Pain Medications: None Prescriptions: None Over the Counter: None History of alcohol / drug use?: No history of alcohol / drug abuse Longest period of sobriety (when/how long): 7 years; relapsed in 2020 Negative Consequences of Use: Financial, Personal relationships, Work / School Substance #1 Name of Substance 1: Opiates/Percocets 1 - Age of First Use: started January 2022 1 - Amount (size/oz): 5-10 mgs 1 - Frequency: daily 1 - Duration: on-going 1 - Last Use / Amount: 1 week ago 1 - Method of Aquiring: unknown 1- Route of Use: unknown Substance #2 Name of Substance 2: Alcohol 2 - Age of First Use: 30 yrs old 2 - Amount (size/oz): "3 spirits" 2 - Frequency: 3-4 times per week 2 - Duration: on-going 2 - Last Use / Amount: 1 week ago 2 - Method of Aquiring: unknown 2 - Route of Substance Use: oral                     ASAM's:  Six Dimensions of Multidimensional Assessment  Dimension 1:  Acute Intoxication and/or Withdrawal Potential:   Dimension 1:  Description of individual's past and current experiences of substance use and withdrawal: Patient states that he has not used in a year and has no current withdrawal symptoms  Dimension 2:  Biomedical Conditions and Complications:   Dimension 2:  Description of patient's biomedical conditions and  complications: Patient has 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 drug use  Dimension 3:  Emotional, Behavioral, or Cognitive Conditions and Complications:  Dimension 3:  Description of  emotional, behavioral, or cognitive conditions and complications: Patient states that he has a history of anxiety and depression and suicidal thoughts  Dimension 4:  Readiness to Change:  Dimension 4:  Description of Readiness to Change criteria: Patient states that he wants to get help and make positive changes in his life  Dimension 5:  Relapse, Continued use, or Continued Problem Potential:  Dimension 5:  Relapse, continued use, or continued problem potential critiera description: Patient states that he has maintained abstinence fro illicit drugs over the past year  Dimension 6:  Recovery/Living Environment:  Dimension 6:  Recovery/Iiving environment criteria description: Patient  states that he lives in a safe and supportive environment  ASAM Severity Score:    ASAM Recommended Level of Treatment:     Substance use Disorder (SUD) Substance Use Disorder (SUD)  Checklist Symptoms of Substance Use: Continued use despite having a persistent/recurrent physical/psychological problem caused/exacerbated by use, Continued use despite persistent or recurrent social, interpersonal problems, caused or exacerbated by use, Large amounts of time spent to obtain, use or recover from the substance(s), Evidence of tolerance, Persistent desire or unsuccessful efforts to cut down or control use, Social, occupational, recreational activities given up or reduced due to use, Substance(s) often taken in larger amounts or over longer times than was intended  Recommendations for Services/Supports/Treatments: Recommendations for Services/Supports/Treatments Recommendations For Services/Supports/Treatments: Medication Management ([facility])  Discharge Disposition:    DSM5 Diagnoses: Patient Active Problem List   Diagnosis Date Noted   MDD (major depressive disorder), recurrent severe, without psychosis (HCC) 02/08/2021   MDD (major depressive disorder), recurrent episode, severe (HCC) 11/30/2020   Major  depressive disorder, recurrent severe without psychotic features (HCC)    Substance induced mood disorder (HCC) 11/22/2019     Referrals to Alternative Service(s): Referred to Alternative Service(s):   Place:   Date:   Time:    Referred to Alternative Service(s):   Place:   Date:   Time:    Referred to Alternative Service(s):   Place:   Date:   Time:    Referred to Alternative Service(s):   Place:   Date:   Time:     Melynda Ripple, Counselor  Comprehensive Clinical Assessment (CCA) Screening, Triage and Referral Note

## 2021-02-12 NOTE — ED Notes (Signed)
Pt is sleeping at present, no distress noted.  Monitoring for safety. 

## 2021-02-12 NOTE — ED Notes (Signed)
TTS monitor placed in patients room. Councilor made aware.

## 2021-02-12 NOTE — BH Assessment (Addendum)
BHH Assessment Progress Note   Per Earlene Plater, MD, pt is to be transferred to the [facility], pending pt signing Voluntary Admission and Consent for Treatment, which this writer will handle shortly.  Please call report to [phone] or [phone].  Pt is to be transported via [employer] when the time comes.  EDP Edwin Dada, DO and pt's nurse, Morrie Sheldon, have been notified.  Doylene Canning, [place] [facility] Coordinator [phone]   Addendum:  Pt has signed consents and I have faxed them to [phone].  Dr Wallace Cullens and Morrie Sheldon have been notified.  Doylene Canning, [place] [facility] Coordinator [phone]

## 2021-02-13 DIAGNOSIS — F17210 Nicotine dependence, cigarettes, uncomplicated: Secondary | ICD-10-CM | POA: Diagnosis not present

## 2021-02-13 DIAGNOSIS — F332 Major depressive disorder, recurrent severe without psychotic features: Secondary | ICD-10-CM | POA: Diagnosis not present

## 2021-02-13 DIAGNOSIS — F1994 Other psychoactive substance use, unspecified with psychoactive substance-induced mood disorder: Secondary | ICD-10-CM | POA: Diagnosis not present

## 2021-02-13 MED ORDER — VENLAFAXINE HCL ER 75 MG PO CP24
75.0000 mg | ORAL_CAPSULE | Freq: Every day | ORAL | Status: DC
  Administered 2021-02-14 – 2021-02-18 (×5): 75 mg via ORAL
  Filled 2021-02-13 (×3): qty 1
  Filled 2021-02-13: qty 14
  Filled 2021-02-13 (×2): qty 1

## 2021-02-13 NOTE — Clinical Social Work Psych Note (Signed)
CSW UPDATE  Patient continues to express interest in residential treatment services for continuity of care. CSW has referred the patient to the following facilities for possible beds:   [name]  [facility]    CSW will continue to follow.    Baldo Daub, MSW, LCSW Clinical Child psychotherapist (Facility Based Crisis) [facility]

## 2021-02-13 NOTE — Group Therapy Note (Signed)
Stress Mngmt & Relation Tech Thursday - Stress Management & Relaxation Techniques (Mindfulness/Deep Breathing/Progressive Muscle Relaxation)  Date: 02/13/21  Type of Therapy/Therapeutic Modalities: Group, Psycho-Educational, Supportive  Participation Level: Active  Objective: Mindfulness is a state of nonjudgmental awareness of what's happening in the present moment, including the awareness of one's own thoughts, feelings, and senses. Facilitators will assist patients in learning the importance of being aware and accepting of oneself. They will also, teach patients about the mechanisms of stress management and relaxation techniques   Therapeutic Goals:  Patient will learn the theory or mindfulness and the fundamental practices of mindfulness meditation.  Patient will learn the basic techniques of deep breathing exercises and how the practice is beneficial for episodes of anxiety, panic attacks and other overwhelming feelings.  Patient will learn the practice of progressive muscle relaxation techniques and how it is beneficial for feelings of anxiety and stress.  Patient will discuss their opinions on each technique and if interested, which one they would utilize.   Summary of Patient's Progress:  Pt was active in group session. Pt stated he has stressors from his family, he has no support system and uses substances to cope with his stress. Pt was engaged in group session and verbalized understanding of material that was presented. Pt named 3 coping skills that he would utilize during his time in [facility].

## 2021-02-13 NOTE — Clinical Social Work Psych Note (Addendum)
CSW engaged with patient for introduction and to discuss potential discharge plans. CSW asked the patient what brought him back to the [facility] and the patient stated "I thought I could do this on my own, but I cant. I need structure and help with my mental health".   Jared Jordan reports that he continues to be interested in long-term substance use treatment. CSW explained to Jared Jordan that in order to qualify for longer term treatment, he must complete a short-term program such as [name] and/or [facility]. Jared Jordan agreed and expressed understanding.   Jared Jordan also reported that he would like to be established with outpatient providers for medication management and therapy services. CSW will provide resources upon discharges.   Jared Jordan declined for CSW to contact any family members and/or supports for collateral contact at this time.    CSW will continue to assess for potential residential treatment beds for SA treatment.   CSW will continue to follow.       Jared Jordan, MSW, LCSW Clinical Child psychotherapist (Facility Based Crisis) [facility]

## 2021-02-13 NOTE — ED Notes (Signed)
Pt given dinner  

## 2021-02-13 NOTE — Progress Notes (Signed)
Received Jared Jordan in his room asleep, he got up for the group activities and his medications. He stated his tentative plan is to discharge on Monday to an inpatient facility for his substance abuse. He remained OOB in the milieu at intervals throughout the day and showered later this afternoon.

## 2021-02-13 NOTE — Group Therapy Note (Signed)
Saff asked the patient to come to the AA meeting. Patient reported to the nurse that he wasn't feeling well and refused to come to the meeting.

## 2021-02-13 NOTE — Clinical Social Work Psych Note (Signed)
Depression  Diagnosis: Depression (Psychoeducational)  Date: 02/13/21  Type of Therapy/Therapeutic Modalities: Group Discussion, Psycho-Education; CBT; Motivational Interviewing   Participation Level: Active  Objective: The purpose of this group is to educate patients on the various components of depression and how it can influence and/or contribute to severe symptoms that affect how you feel, think, and handle daily activities, such as sleeping, eating, or working.  Therapeutic Goals:  Patient will learn the foundations of depression, including its definition and the various types of depression disorders.  Patient will discuss with group members their personal experiences with depression and how it has impacted their lives Patient will learn various cognitive behavioral therapy (CBT) techniques that are effective in treating anxiety symptoms. Patient will discuss with group members how they plan to address and/or maintain their depressive symptoms moving forward.   Summary of Patient's Progress:  Jared Jordan was engaged and participated throughout the group session. Jared Jordan shared with the group that he has struggled with depression for the last decade. He stated that in his depression, he experiences suicidal thoughts because he knows that he can have a better life. "Why would I want to keep living like this and I see nothing is changing and nothing is getting better."  Jared Jordan reported that his depression contributes to his substance use. He states that he uses drugs to "cope" with the feelings that are associated with depression. Jared Jordan reports that he believes he can overcome his depression, if he can change his lifestyle, gain more structure and productivity in his life.

## 2021-02-13 NOTE — ED Notes (Signed)
Pt eating lunch

## 2021-02-13 NOTE — ED Notes (Signed)
Pt eating breakfast 

## 2021-02-13 NOTE — ED Notes (Signed)
Pt given snack. 

## 2021-02-13 NOTE — ED Notes (Signed)
Pt A&O x 4, no distress noted, resting at present.  Pt did not attend AA Group Meeting, stating he did not feel well, but denies comfort measures at present.  Monitoring for safety.

## 2021-02-13 NOTE — ED Notes (Signed)
PT in room sleep, respirations even/unlabored, environment check complete

## 2021-02-13 NOTE — ED Notes (Signed)
Pt sleeping at present, no distress noted.  Monitoring for safety. 

## 2021-02-13 NOTE — ED Provider Notes (Signed)
Behavioral Health Progress Note  Date and Time: 02/13/2021 10:55 AM Name: Jared Jordan MRN:  [id number]  Subjective: Patient states "I always feel like I am in a good enough spot to do what I have to do, I feel like I am ready but I was not ready to take care of the things I need to take care of."  He discusses recent discharge from [facility] health, believes that he was not prepared for discharge.  He reports he was recently treated at [facility], approximately 2 months ago.  He describes a similar admission to [facility] 2 months ago when he felt that he was ready for discharge but now believes he may would have benefited from a longer stay.  He reports he felt the [facility] program was beneficial states he would like to return to [facility] if possible.  He is also open to other substance use treatment programs.  Jared Jordan is insightful regarding recent admission.  He reports compliance with Effexor after discharge on 8/8.  Discussed increasing Effexor to 75 mg daily, discussed side effects.  Patient agrees with plan.   Patient is reassessed face-to-face by nurse practitioner, chart reviewed.  Chart findings discussed with the treatment team.  He is visible on unit, found in common area watching television with peers upon my approach.   Patient is seated, no acute distress. He is alert and oriented, pleasant and cooperative during assessment.    He makes good eye contact.  Speech is clear, coherent, and normal rate and volume he does not appear to be responding to internal/external stimuli.  Patient's feelings and progress discussed.  His thought process is logical and he is forward thinking. Patient reports eating without difficulty, tolerating medications without adverse reactions, and states that he will attend and participate in group sessions.  Reports average sleep and appetite.   He denies suicidal ideations.  He contracts verbally for safety with this Clinical research associate.  Patient denied  homicidal/self-harm ideation, psychosis, and paranoia. Reassurance, support, and encouragement provided.   Diagnosis:  Final diagnoses:  Major depressive disorder, recurrent severe without psychotic features (HCC)    Total Time spent with patient: 20 minutes  Past Psychiatric History: Major depressive disorder, recurrent severe, without psychosis, substance-induced mood disorder Past Medical History:  Past Medical History:  Diagnosis Date   Drug use     Past Surgical History:  Procedure Laterality Date   SHOULDER SURGERY     SHOULDER SURGERY Left 2020   Family History: History reviewed. No pertinent family history. Family Psychiatric  History: None reported Social History:  Social History   Substance and Sexual Activity  Alcohol Use Yes   Alcohol/week: 4.0 - 6.0 standard drinks   Types: 4 - 6 Cans of beer per week   Comment: 2-3 times weekly     Social History   Substance and Sexual Activity  Drug Use Not Currently   Types: IV, Methamphetamines   Comment: using 1gm everyday heroin    Social History   Socioeconomic History   Marital status: Single    Spouse name: Not on file   Number of children: Not on file   Years of education: Not on file   Highest education level: Not on file  Occupational History   Not on file  Tobacco Use   Smoking status: Every Day    Packs/day: 0.25    Years: 1.00    Pack years: 0.25    Types: Cigarettes   Smokeless tobacco: Never  Vaping Use  Vaping Use: Some days   Start date: 11/04/2018   Devices: Jewel Menthol  Substance and Sexual Activity   Alcohol use: Yes    Alcohol/week: 4.0 - 6.0 standard drinks    Types: 4 - 6 Cans of beer per week    Comment: 2-3 times weekly   Drug use: Not Currently    Types: IV, Methamphetamines    Comment: using 1gm everyday heroin   Sexual activity: Yes  Other Topics Concern   Not on file  Social History Narrative   Not on file   Social Determinants of Health   Financial Resource  Strain: Not on file  Food Insecurity: Not on file  Transportation Needs: Not on file  Physical Activity: Not on file  Stress: Not on file  Social Connections: Not on file   SDOH:  SDOH Screenings   Alcohol Screen: Low Risk    Last Alcohol Screening Score (AUDIT): 7  Depression (PHQ2-9): Medium Risk   PHQ-2 Score: 26  Financial Resource Strain: Not on file  Food Insecurity: Not on file  Housing: Not on file  Physical Activity: Not on file  Social Connections: Not on file  Stress: Not on file  Tobacco Use: High Risk   Smoking Tobacco Use: Every Day   Smokeless Tobacco Use: Never  Transportation Needs: Not on file   Additional Social History:                         Sleep: Good  Appetite:  Good  Current Medications:  Current Facility-Administered Medications  Medication Dose Route Frequency Provider Last Rate Last Admin   acetaminophen (TYLENOL) tablet 650 mg  650 mg Oral Q6H PRN Estella Husk, MD       alum & mag hydroxide-simeth (MAALOX/MYLANTA) 200-200-20 MG/5ML suspension 30 mL  30 mL Oral Q4H PRN Estella Husk, MD       hydrOXYzine (ATARAX/VISTARIL) tablet 25 mg  25 mg Oral TID PRN Estella Husk, MD       magnesium hydroxide (MILK OF MAGNESIA) suspension 30 mL  30 mL Oral Daily PRN Estella Husk, MD       traZODone (DESYREL) tablet 50 mg  50 mg Oral QHS PRN Estella Husk, MD       venlafaxine XR (EFFEXOR-XR) 24 hr capsule 37.5 mg  37.5 mg Oral Q breakfast Estella Husk, MD   37.5 mg at 02/13/21 1610   Current Outpatient Medications  Medication Sig Dispense Refill   hydrOXYzine (ATARAX/VISTARIL) 25 MG tablet Take 1 tablet (25 mg total) by mouth 3 (three) times daily as needed for anxiety. 30 tablet 0   venlafaxine XR (EFFEXOR-XR) 37.5 MG 24 hr capsule Take 1 capsule (37.5 mg total) by mouth daily. 30 capsule 0    Labs  Lab Results:  Admission on 02/12/2021  Component Date Value Ref Range Status   POC Amphetamine  UR 02/12/2021 None Detected  NONE DETECTED (Cut Off Level 1000 ng/mL) Final   POC Secobarbital (BAR) 02/12/2021 None Detected  NONE DETECTED (Cut Off Level 300 ng/mL) Final   POC Buprenorphine (BUP) 02/12/2021 None Detected  NONE DETECTED (Cut Off Level 10 ng/mL) Final   POC Oxazepam (BZO) 02/12/2021 None Detected  NONE DETECTED (Cut Off Level 300 ng/mL) Final   POC Cocaine UR 02/12/2021 None Detected  NONE DETECTED (Cut Off Level 300 ng/mL) Final   POC Methamphetamine UR 02/12/2021 None Detected  NONE DETECTED (Cut Off Level 1000 ng/mL) Final  POC Morphine 02/12/2021 Positive (A) NONE DETECTED (Cut Off Level 300 ng/mL) Final   POC Oxycodone UR 02/12/2021 None Detected  NONE DETECTED (Cut Off Level 100 ng/mL) Final   POC Methadone UR 02/12/2021 None Detected  NONE DETECTED (Cut Off Level 300 ng/mL) Final   POC Marijuana UR 02/12/2021 Positive (A) NONE DETECTED (Cut Off Level 50 ng/mL) Final  Admission on 02/12/2021, Discharged on 02/12/2021  Component Date Value Ref Range Status   SARS Coronavirus 2 by RT PCR 02/12/2021 NEGATIVE  NEGATIVE Final   Comment: (NOTE) SARS-CoV-2 target nucleic acids are NOT DETECTED.  The SARS-CoV-2 RNA is generally detectable in upper respiratory specimens during the acute phase of infection. The lowest concentration of SARS-CoV-2 viral copies this assay can detect is 138 copies/mL. A negative result does not preclude SARS-Cov-2 infection and should not be used as the sole basis for treatment or other patient management decisions. A negative result may occur with  improper specimen collection/handling, submission of specimen other than nasopharyngeal swab, presence of viral mutation(s) within the areas targeted by this assay, and inadequate number of viral copies(<138 copies/mL). A negative result must be combined with clinical observations, patient history, and epidemiological information. The expected result is Negative.  Fact Sheet for Patients:   BloggerCourse.com  Fact Sheet for Healthcare Providers:  SeriousBroker.it  This test is no                          t yet approved or cleared by the [place] FDA and  has been authorized for detection and/or diagnosis of SARS-CoV-2 by FDA under an Emergency Use Authorization (EUA). This EUA will remain  in effect (meaning this test can be used) for the duration of the COVID-19 declaration under Section 564(b)(1) of the Act, 21 U.S.C.section 360bbb-3(b)(1), unless the authorization is terminated  or revoked sooner.       Influenza A by PCR 02/12/2021 NEGATIVE  NEGATIVE Final   Influenza B by PCR 02/12/2021 NEGATIVE  NEGATIVE Final   Comment: (NOTE) The Xpert Xpress SARS-CoV-2/FLU/RSV plus assay is intended as an aid in the diagnosis of influenza from Nasopharyngeal swab specimens and should not be used as a sole basis for treatment. Nasal washings and aspirates are unacceptable for Xpert Xpress SARS-CoV-2/FLU/RSV testing.  Fact Sheet for Patients: BloggerCourse.com  Fact Sheet for Healthcare Providers: SeriousBroker.it  This test is not yet approved or cleared by the [place] FDA and has been authorized for detection and/or diagnosis of SARS-CoV-2 by FDA under an Emergency Use Authorization (EUA). This EUA will remain in effect (meaning this test can be used) for the duration of the COVID-19 declaration under Section 564(b)(1) of the Act, 21 U.S.C. section 360bbb-3(b)(1), unless the authorization is terminated or revoked.  Performed at [facility], [address], [place], [place] [address]    Sodium 02/12/2021 135  135 - 145 mmol/L Final   Potassium 02/12/2021 4.1  3.5 - 5.1 mmol/L Final   Chloride 02/12/2021 102  98 - 111 mmol/L Final   CO2 02/12/2021 28  22 - 32 mmol/L Final   Glucose, Bld 02/12/2021 92  70 - 99 mg/dL Final   Glucose  reference range applies only to samples taken after fasting for at least 8 hours.   BUN 02/12/2021 13  6 - 20 mg/dL Final   Creatinine, Ser 02/12/2021 0.56 (A) 0.61 - 1.24 mg/dL Final   Calcium 60/45/4098 8.9  8.9 - 10.3 mg/dL Final  Total Protein 02/12/2021 6.7  6.5 - 8.1 g/dL Final   Albumin 16/04/9603 3.9  3.5 - 5.0 g/dL Final   AST 54/03/8118 19  15 - 41 U/L Final   ALT 02/12/2021 15  0 - 44 U/L Final   Alkaline Phosphatase 02/12/2021 75  38 - 126 U/L Final   Total Bilirubin 02/12/2021 0.6  0.3 - 1.2 mg/dL Final   GFR, Estimated 02/12/2021 >60  >60 mL/min Final   Comment: (NOTE) Calculated using the CKD-EPI Creatinine Equation (2021)    Anion gap 02/12/2021 5  5 - 15 Final   Performed at [facility], [address] [place], [place] [address]   Alcohol, Ethyl (B) 02/12/2021 <10  <10 mg/dL Final   Comment: (NOTE) Lowest detectable limit for serum alcohol is 10 mg/dL.  For medical purposes only. Performed at [facility], [address], [place], [place] [address]    WBC 02/12/2021 7.1  4.0 - 10.5 K/uL Final   RBC 02/12/2021 4.68  4.22 - 5.81 MIL/uL Final   Hemoglobin 02/12/2021 13.4  13.0 - 17.0 g/dL Final   HCT 30/86/5784 40.7  39.0 - 52.0 % Final   MCV 02/12/2021 87.0  80.0 - 100.0 fL Final   MCH 02/12/2021 28.6  26.0 - 34.0 pg Final   MCHC 02/12/2021 32.9  30.0 - 36.0 g/dL Final   RDW 69/62/9528 13.0  11.5 - 15.5 % Final   Platelets 02/12/2021 263  150 - 400 K/uL Final   nRBC 02/12/2021 0.0  0.0 - 0.2 % Final   Neutrophils Relative % 02/12/2021 73  % Final   Neutro Abs 02/12/2021 5.2  1.7 - 7.7 K/uL Final   Lymphocytes Relative 02/12/2021 18  % Final   Lymphs Abs 02/12/2021 1.3  0.7 - 4.0 K/uL Final   Monocytes Relative 02/12/2021 5  % Final   Monocytes Absolute 02/12/2021 0.4  0.1 - 1.0 K/uL Final   Eosinophils Relative 02/12/2021 3  % Final   Eosinophils Absolute 02/12/2021 0.2  0.0 - 0.5 K/uL Final   Basophils Relative  02/12/2021 1  % Final   Basophils Absolute 02/12/2021 0.1  0.0 - 0.1 K/uL Final   Immature Granulocytes 02/12/2021 0  % Final   Abs Immature Granulocytes 02/12/2021 0.03  0.00 - 0.07 K/uL Final   Performed at [facility], [address], [place], [place] [address]   Salicylate Lvl 02/12/2021 <7.0 (A) 7.0 - 30.0 mg/dL Final   Performed at [facility], [address] [place], [place] [address]   Acetaminophen (Tylenol), Serum 02/12/2021 <10 (A) 10 - 30 ug/mL Final   Comment: (NOTE) Therapeutic concentrations vary significantly. A range of 10-30 ug/mL  may be an effective concentration for many patients. However, some  are best treated at concentrations outside of this range. Acetaminophen concentrations >150 ug/mL at 4 hours after ingestion  and >50 ug/mL at 12 hours after ingestion are often associated with  toxic reactions.  Performed at [facility], [address], [place], [place] [address]   Admission on 02/08/2021, Discharged on 02/10/2021  Component Date Value Ref Range Status   SARS Coronavirus 2 by RT PCR 02/08/2021 NEGATIVE  NEGATIVE Final   Comment: (NOTE) SARS-CoV-2 target nucleic acids are NOT DETECTED.  The SARS-CoV-2 RNA is generally detectable in upper respiratory specimens during the acute phase of infection. The lowest concentration of SARS-CoV-2 viral copies this assay can detect is 138 copies/mL. A negative result does not  preclude SARS-Cov-2 infection and should not be used as the sole basis for treatment or other patient management decisions. A negative result may occur with  improper specimen collection/handling, submission of specimen other than nasopharyngeal swab, presence of viral mutation(s) within the areas targeted by this assay, and inadequate number of viral copies(<138 copies/mL). A negative result must be combined with clinical observations, patient history, and  epidemiological information. The expected result is Negative.  Fact Sheet for Patients:  BloggerCourse.com  Fact Sheet for Healthcare Providers:  SeriousBroker.it  This test is no                          t yet approved or cleared by the [place] FDA and  has been authorized for detection and/or diagnosis of SARS-CoV-2 by FDA under an Emergency Use Authorization (EUA). This EUA will remain  in effect (meaning this test can be used) for the duration of the COVID-19 declaration under Section 564(b)(1) of the Act, 21 U.S.C.section 360bbb-3(b)(1), unless the authorization is terminated  or revoked sooner.       Influenza A by PCR 02/08/2021 NEGATIVE  NEGATIVE Final   Influenza B by PCR 02/08/2021 NEGATIVE  NEGATIVE Final   Comment: (NOTE) The Xpert Xpress SARS-CoV-2/FLU/RSV plus assay is intended as an aid in the diagnosis of influenza from Nasopharyngeal swab specimens and should not be used as a sole basis for treatment. Nasal washings and aspirates are unacceptable for Xpert Xpress SARS-CoV-2/FLU/RSV testing.  Fact Sheet for Patients: BloggerCourse.com  Fact Sheet for Healthcare Providers: SeriousBroker.it  This test is not yet approved or cleared by the [place] FDA and has been authorized for detection and/or diagnosis of SARS-CoV-2 by FDA under an Emergency Use Authorization (EUA). This EUA will remain in effect (meaning this test can be used) for the duration of the COVID-19 declaration under Section 564(b)(1) of the Act, 21 U.S.C. section 360bbb-3(b)(1), unless the authorization is terminated or revoked.  Performed at [facility], [address], [place], [place] [address]    WBC 02/08/2021 6.5  4.0 - 10.5 K/uL Final   RBC 02/08/2021 5.15  4.22 - 5.81 MIL/uL Final   Hemoglobin 02/08/2021 14.9  13.0 - 17.0 g/dL Final   HCT 13/02/6577 44.9  39.0 - 52.0  % Final   MCV 02/08/2021 87.2  80.0 - 100.0 fL Final   MCH 02/08/2021 28.9  26.0 - 34.0 pg Final   MCHC 02/08/2021 33.2  30.0 - 36.0 g/dL Final   RDW 46/96/2952 13.0  11.5 - 15.5 % Final   Platelets 02/08/2021 330  150 - 400 K/uL Final   nRBC 02/08/2021 0.0  0.0 - 0.2 % Final   Neutrophils Relative % 02/08/2021 69  % Final   Neutro Abs 02/08/2021 4.5  1.7 - 7.7 K/uL Final   Lymphocytes Relative 02/08/2021 20  % Final   Lymphs Abs 02/08/2021 1.3  0.7 - 4.0 K/uL Final   Monocytes Relative 02/08/2021 7  % Final   Monocytes Absolute 02/08/2021 0.4  0.1 - 1.0 K/uL Final   Eosinophils Relative 02/08/2021 3  % Final   Eosinophils Absolute 02/08/2021 0.2  0.0 - 0.5 K/uL Final   Basophils Relative 02/08/2021 0  % Final   Basophils Absolute 02/08/2021 0.0  0.0 - 0.1 K/uL Final   Immature Granulocytes 02/08/2021 1  % Final   Abs Immature Granulocytes 02/08/2021 0.05  0.00 - 0.07 K/uL Final   Performed at [facility], [address] [place], [place] [address]   Sodium 02/08/2021 137  135 - 145 mmol/L Final   Potassium 02/08/2021 4.4  3.5 - 5.1 mmol/L Final   Chloride 02/08/2021 101  98 - 111 mmol/L Final   CO2 02/08/2021 26  22 - 32 mmol/L Final   Glucose, Bld 02/08/2021 75  70 - 99 mg/dL Final   Glucose reference range applies only to samples taken after fasting for at least 8 hours.   BUN 02/08/2021 12  6 - 20 mg/dL Final   Creatinine, Ser 02/08/2021 0.83  0.61 - 1.24 mg/dL Final   Calcium 24/40/1027 9.9  8.9 - 10.3 mg/dL Final   Total Protein 25/36/6440 6.9  6.5 - 8.1 g/dL Final   Albumin 34/74/2595 4.0  3.5 - 5.0 g/dL Final   AST 63/87/5643 19  15 - 41 U/L Final   ALT 02/08/2021 19  0 - 44 U/L Final   Alkaline Phosphatase 02/08/2021 87  38 - 126 U/L Final   Total Bilirubin 02/08/2021 0.4  0.3 - 1.2 mg/dL Final   GFR, Estimated 02/08/2021 >60  >60 mL/min Final   Comment: (NOTE) Calculated using the CKD-EPI Creatinine Equation (2021)    Anion gap 02/08/2021 10  5 - 15 Final    Performed at [facility], [address] [place], [place] [address]   Alcohol, Ethyl (B) 02/08/2021 <10  <10 mg/dL Final   Comment: (NOTE) Lowest detectable limit for serum alcohol is 10 mg/dL.  For medical purposes only. Performed at [facility], [address] [place], [place] [address]    POC Amphetamine UR 02/08/2021 None Detected  NONE DETECTED (Cut Off Level 1000 ng/mL) Final   POC Secobarbital (BAR) 02/08/2021 None Detected  NONE DETECTED (Cut Off Level 300 ng/mL) Final   POC Buprenorphine (BUP) 02/08/2021 None Detected  NONE DETECTED (Cut Off Level 10 ng/mL) Final   POC Oxazepam (BZO) 02/08/2021 None Detected  NONE DETECTED (Cut Off Level 300 ng/mL) Final   POC Cocaine UR 02/08/2021 None Detected  NONE DETECTED (Cut Off Level 300 ng/mL) Final   POC Methamphetamine UR 02/08/2021 None Detected  NONE DETECTED (Cut Off Level 1000 ng/mL) Final   POC Morphine 02/08/2021 None Detected  NONE DETECTED (Cut Off Level 300 ng/mL) Final   POC Oxycodone UR 02/08/2021 None Detected  NONE DETECTED (Cut Off Level 100 ng/mL) Final   POC Methadone UR 02/08/2021 None Detected  NONE DETECTED (Cut Off Level 300 ng/mL) Final   POC Marijuana UR 02/08/2021 None Detected  NONE DETECTED (Cut Off Level 50 ng/mL) Final   SARSCOV2ONAVIRUS 2 AG 02/08/2021 NEGATIVE  NEGATIVE Final   Comment: (NOTE) SARS-CoV-2 antigen NOT DETECTED.   Negative results are presumptive.  Negative results do not preclude SARS-CoV-2 infection and should not be used as the sole basis for treatment or other patient management decisions, including infection  control decisions, particularly in the presence of clinical signs and  symptoms consistent with COVID-19, or in those who have been in contact with the virus.  Negative results must be combined with clinical observations, patient history, and epidemiological information. The expected result is Negative.  Fact Sheet for Patients:  https://www.jennings-kim.com/  Fact Sheet for Healthcare Providers: https://alexander-rogers.biz/  This test is not yet approved or cleared by the [place] FDA and  has been authorized for detection and/or diagnosis of SARS-CoV-2 by FDA under an Emergency Use Authorization (EUA).  This EUA will remain in effect (meaning this test can be  used) for the duration of  the COV                          ID-19 declaration under Section 564(b)(1) of the Act, 21 U.S.C. section 360bbb-3(b)(1), unless the authorization is terminated or revoked sooner.     SARS Coronavirus 2 Ag 02/08/2021 Negative  Negative Final   SARSCOV2ONAVIRUS 2 AG 02/08/2021 NEGATIVE  NEGATIVE Final   Comment: (NOTE) SARS-CoV-2 antigen NOT DETECTED.   Negative results are presumptive.  Negative results do not preclude SARS-CoV-2 infection and should not be used as the sole basis for treatment or other patient management decisions, including infection  control decisions, particularly in the presence of clinical signs and  symptoms consistent with COVID-19, or in those who have been in contact with the virus.  Negative results must be combined with clinical observations, patient history, and epidemiological information. The expected result is Negative.  Fact Sheet for Patients: https://www.jennings-kim.com/  Fact Sheet for Healthcare Providers: https://alexander-rogers.biz/  This test is not yet approved or cleared by the [place] FDA and  has been authorized for detection and/or diagnosis of SARS-CoV-2 by FDA under an Emergency Use Authorization (EUA).  This EUA will remain in effect (meaning this test can be used) for the duration of  the COV                          ID-19 declaration under Section 564(b)(1) of the Act, 21 U.S.C. section 360bbb-3(b)(1), unless the authorization is terminated or revoked sooner.    Admission on 11/30/2020, Discharged on  12/04/2020  Component Date Value Ref Range Status   Hepatitis B Surface Ag 12/01/2020 NON REACTIVE  NON REACTIVE Final   HCV Ab 12/01/2020 Reactive (A) NON REACTIVE Final   Comment: (NOTE) The CDC recommends that a Reactive HCV antibody result be followed up  with a HCV Nucleic Acid Amplification test.     Hep A IgM 12/01/2020 See Scanned report in [place] [facility] (A) NON REACTIVE Final   Performed at [facility]   Hep B C IgM 12/01/2020 NON REACTIVE  NON REACTIVE Final   Performed at [facility], [address] [place], [place] [address]   Sodium 12/01/2020 137  135 - 145 mmol/L Final   Potassium 12/01/2020 4.8  3.5 - 5.1 mmol/L Final   Chloride 12/01/2020 102  98 - 111 mmol/L Final   CO2 12/01/2020 25  22 - 32 mmol/L Final   Glucose, Bld 12/01/2020 132 (A) 70 - 99 mg/dL Final   Glucose reference range applies only to samples taken after fasting for at least 8 hours.   BUN 12/01/2020 15  6 - 20 mg/dL Final   Creatinine, Ser 12/01/2020 0.80  0.61 - 1.24 mg/dL Final   Calcium 60/45/4098 9.7  8.9 - 10.3 mg/dL Final   Total Protein 11/91/4782 7.9  6.5 - 8.1 g/dL Final   Albumin 95/62/1308 4.5  3.5 - 5.0 g/dL Final   AST 65/78/4696 59 (A) 15 - 41 U/L Final   ALT 12/01/2020 93 (A) 0 - 44 U/L Final   Alkaline Phosphatase 12/01/2020 77  38 - 126 U/L Final   Total Bilirubin 12/01/2020 0.6  0.3 - 1.2 mg/dL Final   GFR, Estimated 12/01/2020 >60  >60 mL/min Final   Comment: (NOTE) Calculated using the CKD-EPI Creatinine Equation (2021)    Anion gap 12/01/2020 10  5 - 15 Final  Performed at [facility], [address] [place], [place] [address]   TSH 12/01/2020 0.276 (A) 0.350 - 4.500 uIU/mL Final   Comment: Performed by a 3rd Generation assay with a functional sensitivity of <=0.01 uIU/mL. Performed at [facility], [address] [place], [place] [address]    Free T4 12/03/2020 0.76  0.61 - 1.12 ng/dL Final   Comment:  (NOTE) Biotin ingestion may interfere with free T4 tests. If the results are inconsistent with the TSH level, previous test results, or the clinical presentation, then consider biotin interference. If needed, order repeat testing after stopping biotin. Performed at [facility], [address] [place], [place] [address]    T3, Free 12/03/2020 2.5  2.0 - 4.4 pg/mL Final   Comment: (NOTE) Performed At: [facility] [address] [place], [place] [id number] Jolene SchimkeNagendra Sanjai MD UX:[id number]:[phone]    Cholesterol 12/03/2020 174  0 - 200 mg/dL Final   Triglycerides 53/66/440305/31/2022 84  <150 mg/dL Final   HDL 47/42/595605/31/2022 61  >40 mg/dL Final   Total CHOL/HDL Ratio 12/03/2020 2.9  RATIO Final   VLDL 12/03/2020 17  0 - 40 mg/dL Final   LDL Cholesterol 12/03/2020 96  0 - 99 mg/dL Final   Comment:        Total Cholesterol/HDL:CHD Risk Coronary Heart Disease Risk Table                     Men   Women  1/2 Average Risk   3.4   3.3  Average Risk       5.0   4.4  2 X Average Risk   9.6   7.1  3 X Average Risk  23.4   11.0        Use the calculated Patient Ratio above and the CHD Risk Table to determine the patient's CHD Risk.        ATP III CLASSIFICATION (LDL):  <100     mg/dL   Optimal  [phone]  mg/dL   Near or Above                    Optimal  130-159  mg/dL   Borderline  [phone]  mg/dL   High  >884>190     mg/dL   Very High Performed at [facility], [address] [place], [place] [address]    HCV Quantitative 12/03/2020 68,900  >50 IU/mL Final   HCV Quantitative Log 12/03/2020 4.838  >1.70 log10 IU/mL Final   Test Information 12/03/2020 Comment   Final   Comment: (NOTE) The quantitative range of this assay is 15 IU/mL to 100 million IU/mL. Performed At: [facility] [address] [place], [place] [id number] Jolene SchimkeNagendra Sanjai MD AT:[id number]:[phone]    Total Protein 12/03/2020 7.1  6.5 - 8.1 g/dL Final   Albumin 27/06/237605/31/2022 4.3  3.5 - 5.0 g/dL Final   AST  28/31/517605/31/2022 49 (A) 15 - 41 U/L Final   ALT 12/03/2020 89 (A) 0 - 44 U/L Final   Alkaline Phosphatase 12/03/2020 68  38 - 126 U/L Final   Total Bilirubin 12/03/2020 0.5  0.3 - 1.2 mg/dL Final   Bilirubin, Direct 12/03/2020 <0.1  0.0 - 0.2 mg/dL Final   Indirect Bilirubin 12/03/2020 NOT CALCULATED  0.3 - 0.9 mg/dL Final   Performed at [facility], [address] [place], [place] [address]   Hgb A1c MFr Bld 12/03/2020 5.3  4.8 - 5.6 %  Final   Comment: (NOTE)         Prediabetes: 5.7 - 6.4         Diabetes: >6.4         Glycemic control for adults with diabetes: <7.0    Mean Plasma Glucose 12/03/2020 105  mg/dL Final   Comment: (NOTE) Performed At: [facility] [address] [place], [place] [id number] Jolene Schimke MD OL:[id number]    Hepatitis C Quantitation 12/01/2020 27,600  IU/mL Final   HCV log10 12/01/2020 4.441  log10 IU/mL Final   Test Information (HCV): 12/01/2020 Comment   Final   Comment: (NOTE) The quantitative range of this assay is 15 IU/mL to 100 million IU/mL.    Interpretation (HCV): 12/01/2020 Comment   Final   Comment: (NOTE) Positive HCV antibody screen with the presence of HCV RNA is consistent with active infection. Performed At: [facility] [address] [place], [place] [id number] Jolene Schimke MD XJ:[id number]   Admission on 11/30/2020, Discharged on 11/30/2020  Component Date Value Ref Range Status   Sodium 11/30/2020 133 (A) 135 - 145 mmol/L Final   Potassium 11/30/2020 3.6  3.5 - 5.1 mmol/L Final   Chloride 11/30/2020 100  98 - 111 mmol/L Final   CO2 11/30/2020 24  22 - 32 mmol/L Final   Glucose, Bld 11/30/2020 106 (A) 70 - 99 mg/dL Final   Glucose reference range applies only to samples taken after fasting for at least 8 hours.   BUN 11/30/2020 13  6 - 20 mg/dL Final   Creatinine, Ser 11/30/2020 0.94  0.61 - 1.24 mg/dL Final   Calcium 41/58/3094 9.4  8.9 - 10.3 mg/dL Final   Total Protein 07/68/0881 6.4  (A) 6.5 - 8.1 g/dL Final   Albumin 05/05/5944 3.9  3.5 - 5.0 g/dL Final   AST 85/92/9244 34  15 - 41 U/L Final   ALT 11/30/2020 58 (A) 0 - 44 U/L Final   Alkaline Phosphatase 11/30/2020 64  38 - 126 U/L Final   Total Bilirubin 11/30/2020 0.5  0.3 - 1.2 mg/dL Final   GFR, Estimated 11/30/2020 >60  >60 mL/min Final   Comment: (NOTE) Calculated using the CKD-EPI Creatinine Equation (2021)    Anion gap 11/30/2020 9  5 - 15 Final   Performed at [facility], [address], [place], [place] [address]   Alcohol, Ethyl (B) 11/30/2020 <10  <10 mg/dL Final   Comment: (NOTE) Lowest detectable limit for serum alcohol is 10 mg/dL.  For medical purposes only. Performed at [facility], [address] [place], [place] [address]    Salicylate Lvl 11/30/2020 <7.0 (A) 7.0 - 30.0 mg/dL Final   Performed at [facility], [address] [place], [place] [address]   Acetaminophen (Tylenol), Serum 11/30/2020 <10 (A) 10 - 30 ug/mL Final   Comment: (NOTE) Therapeutic concentrations vary significantly. A range of 10-30 ug/mL  may be an effective concentration for many patients. However, some  are best treated at concentrations outside of this range. Acetaminophen concentrations >150 ug/mL at 4 hours after ingestion  and >50 ug/mL at 12 hours after ingestion are often associated with  toxic reactions.  Performed at [facility], [address] [place], [place] [address]    WBC 11/30/2020 8.6  4.0 - 10.5 K/uL Final   RBC 11/30/2020 4.26  4.22 - 5.81 MIL/uL Final   Hemoglobin 11/30/2020 12.5 (A) 13.0 - 17.0 g/dL Final   HCT 38/32/9191 37.7 (A)  39.0 - 52.0 % Final   MCV 11/30/2020 88.5  80.0 - 100.0 fL Final   MCH 11/30/2020 29.3  26.0 - 34.0 pg Final   MCHC 11/30/2020 33.2  30.0 - 36.0 g/dL Final   RDW 40/98/1191 13.1  11.5 - 15.5 % Final   Platelets 11/30/2020 220  150 - 400 K/uL Final   nRBC 11/30/2020 0.0  0.0 - 0.2 % Final   Performed at [facility], [address], [place], [place] [address]   Opiates 11/30/2020 POSITIVE (A) NONE DETECTED Final   Cocaine 11/30/2020 NONE DETECTED  NONE DETECTED Final   Benzodiazepines 11/30/2020 NONE DETECTED  NONE DETECTED Final   Amphetamines 11/30/2020 NONE DETECTED  NONE DETECTED Final   Tetrahydrocannabinol 11/30/2020 NONE DETECTED  NONE DETECTED Final   Barbiturates 11/30/2020 NONE DETECTED  NONE DETECTED Final   Comment: (NOTE) DRUG SCREEN FOR MEDICAL PURPOSES ONLY.  IF CONFIRMATION IS NEEDED FOR ANY PURPOSE, NOTIFY LAB WITHIN 5 DAYS.  LOWEST DETECTABLE LIMITS FOR URINE DRUG SCREEN Drug Class                     Cutoff (ng/mL) Amphetamine and metabolites    1000 Barbiturate and metabolites    200 Benzodiazepine                 200 Tricyclics and metabolites     300 Opiates and metabolites        300 Cocaine and metabolites        300 THC                            50 Performed at [facility], [address] [place], [place] [address]    SARS Coronavirus 2 by RT PCR 11/30/2020 NEGATIVE  NEGATIVE Final   Comment: (NOTE) SARS-CoV-2 target nucleic acids are NOT DETECTED.  The SARS-CoV-2 RNA is generally detectable in upper respiratory specimens during the acute phase of infection. The lowest concentration of SARS-CoV-2 viral copies this assay can detect is 138 copies/mL. A negative result does not preclude SARS-Cov-2 infection and should not be used as the sole basis for treatment or other patient management decisions. A negative result may occur with  improper specimen collection/handling, submission of specimen other than nasopharyngeal swab, presence of viral mutation(s) within the areas targeted by this assay, and inadequate number of viral copies(<138 copies/mL). A negative result must be combined with clinical observations, patient history, and epidemiological information. The expected result is Negative.  Fact Sheet for Patients:  BloggerCourse.com  Fact  Sheet for Healthcare Providers:  SeriousBroker.it  This test is no                          t yet approved or cleared by the [place] FDA and  has been authorized for detection and/or diagnosis of SARS-CoV-2 by FDA under an Emergency Use Authorization (EUA). This EUA will remain  in effect (meaning this test can be used) for the duration of the COVID-19 declaration under Section 564(b)(1) of the Act, 21 U.S.C.section 360bbb-3(b)(1), unless the authorization is terminated  or revoked sooner.       Influenza A by PCR 11/30/2020 NEGATIVE  NEGATIVE Final   Influenza B by PCR 11/30/2020 NEGATIVE  NEGATIVE Final   Comment: (NOTE) The Xpert Xpress SARS-CoV-2/FLU/RSV plus assay is intended as an aid in the diagnosis of influenza from Nasopharyngeal  swab specimens and should not be used as a sole basis for treatment. Nasal washings and aspirates are unacceptable for Xpert Xpress SARS-CoV-2/FLU/RSV testing.  Fact Sheet for Patients: BloggerCourse.com  Fact Sheet for Healthcare Providers: SeriousBroker.it  This test is not yet approved or cleared by the [place] FDA and has been authorized for detection and/or diagnosis of SARS-CoV-2 by FDA under an Emergency Use Authorization (EUA). This EUA will remain in effect (meaning this test can be used) for the duration of the COVID-19 declaration under Section 564(b)(1) of the Act, 21 U.S.C. section 360bbb-3(b)(1), unless the authorization is terminated or revoked.  Performed at [facility], [address] [place], [place] [address]     Blood Alcohol level:  Lab Results  Component Value Date   ETH <10 02/12/2021   ETH <10 02/08/2021    Metabolic Disorder Labs: Lab Results  Component Value Date   HGBA1C 5.3 12/03/2020   MPG 105 12/03/2020   MPG 108.28 11/23/2019   No results found for: PROLACTIN Lab Results  Component Value Date   CHOL  174 12/03/2020   TRIG 84 12/03/2020   HDL 61 12/03/2020   CHOLHDL 2.9 12/03/2020   VLDL 17 12/03/2020   LDLCALC 96 12/03/2020   LDLCALC 116 (H) 11/23/2019    Therapeutic Lab Levels: No results found for: LITHIUM No results found for: VALPROATE No components found for:  CBMZ  Physical Findings   AIMS    Flowsheet Row [facility]  AIMS Total Score 0 0      AUDIT    Flowsheet Row [facility]) [facility]  Alcohol Use Disorder Identification Test Final Score (AUDIT) 7 8      PHQ2-9    Flowsheet Row ED from 11/30/2020 in [facility]  PHQ-2 Total Score 5  PHQ-9 Total Score 26      Flowsheet Row ED from 02/12/2021 in [facility] Most recent reading at 02/12/2021  3:51 PM ED from 02/12/2021 in [facility] Most recent reading at 02/12/2021  3:35 PM ED from 02/08/2021 in [facility] Most recent reading at 02/08/2021 10:01 AM  C-SSRS RISK CATEGORY High Risk High Risk Low Risk        Musculoskeletal  Strength & Muscle Tone: within normal limits Gait & Station: normal Patient leans: N/A  Psychiatric Specialty Exam  Presentation  General Appearance: Appropriate for Environment; Casual  Eye Contact:Good  Speech:Clear and Coherent; Normal Rate  Speech Volume:Normal  Handedness:Right   Mood and Affect  Mood:Euthymic  Affect:Appropriate; Congruent   Thought Process  Thought Processes:Coherent; Goal Directed; Linear  Descriptions of Associations:Intact  Orientation:Full (Time, Place and Person)  Thought Content:Logical; WDL   Diagnosis of Schizophrenia or Schizoaffective disorder in past: No    Hallucinations:Hallucinations: None  Ideas of Reference:None  Suicidal Thoughts:Suicidal Thoughts: No SI Active Intent and/or Plan: Without Intent; Without Plan  Homicidal Thoughts:Homicidal Thoughts: No   Sensorium  Memory:Immediate Good; Recent Good; Remote Good  Judgment:Fair  Insight:Fair   Executive Functions  Concentration:Good  Attention Span:Good  Recall:Good  Fund of Knowledge:Good  Language:Good   Psychomotor Activity  Psychomotor Activity:Psychomotor Activity: Normal   Assets  Assets:Communication Skills; Desire for Improvement; Resilience; Social Support; Physical Health;  Leisure Time   Sleep  Sleep:Sleep: Fair   Nutritional Assessment (For OBS and [name] admissions only) Has the patient had a weight loss or gain of 10 pounds or more in the last 3 months?: Yes Has the patient had a decrease in food intake/or appetite?: No Does the patient have dental problems?: No Does the patient have eating habits or behaviors that may be indicators of an eating disorder including binging or inducing vomiting?: No Has the patient recently lost weight without trying?: Yes, 2-13 lbs. Has the patient been eating poorly because of a decreased appetite?: No Malnutrition Screening Tool Score: 1    Physical Exam  Physical Exam Vitals and nursing note reviewed.  Constitutional:      Appearance: Normal appearance. He is well-developed and normal weight.  HENT:     Head: Normocephalic and atraumatic.     Nose: Nose normal.  Cardiovascular:     Rate and Rhythm: Normal rate.  Pulmonary:     Effort: Pulmonary effort is normal.  Musculoskeletal:        General: Normal range of motion.     Cervical back: Normal range of motion.  Neurological:     Mental Status: He is alert and oriented to person, place, and time.  Psychiatric:        Attention and Perception: Attention and perception normal.         Mood and Affect: Mood and affect normal.        Speech: Speech normal.        Behavior: Behavior normal. Behavior is cooperative.        Thought Content: Thought content normal.        Cognition and Memory: Cognition and memory normal.        Judgment: Judgment normal.   Review of Systems  Constitutional: Negative.   HENT: Negative.    Eyes: Negative.   Respiratory: Negative.    Cardiovascular: Negative.   Gastrointestinal: Negative.   Genitourinary: Negative.   Musculoskeletal: Negative.   Skin: Negative.   Neurological: Negative.   Endo/Heme/Allergies: Negative.   Psychiatric/Behavioral:  Positive for substance abuse.   Blood pressure 100/75, pulse 76, temperature 97.8 F (36.6 C), temperature source Tympanic, resp. rate 18, SpO2 99 %. There is no height or weight on file to calculate BMI.  Treatment Plan Summary: Daily contact with patient to assess and evaluate symptoms and progress in treatment and Medication management Venlafaxine increased from 37.5 mg daily to 75 mg daily. Social work continues to seek substance use treatment options.  Current medications include: -Hydroxyzine 25 mg 3 times daily as needed/anxiety -trazodone 50 mg nightly/sleep -venlafaxine XR 75 mg daily  Lenard Lance, FNP 02/13/2021 10:55 AM

## 2021-02-14 DIAGNOSIS — F17210 Nicotine dependence, cigarettes, uncomplicated: Secondary | ICD-10-CM | POA: Diagnosis not present

## 2021-02-14 DIAGNOSIS — F332 Major depressive disorder, recurrent severe without psychotic features: Secondary | ICD-10-CM | POA: Diagnosis not present

## 2021-02-14 DIAGNOSIS — F1994 Other psychoactive substance use, unspecified with psychoactive substance-induced mood disorder: Secondary | ICD-10-CM | POA: Diagnosis not present

## 2021-02-14 NOTE — Group Therapy Note (Signed)
Wellness Friday - Wellness (8 Dimensions of Wellness)  Date: 02/14/21  Type of Therapy/Therapeutic Modalities: Psycho-Educational, Supportive, Motivational Interviewing; Holistic   Participation Level: Active  Objective: To challenge patients to reflect on their recovery process utilizing a holistic approach by reviewing the 8 Dimensions of Wellness to assist them in making positive changes in their lives by impacting the mind, body, and spirit. The 8 Dimensions of wellness include: emotional, occupational, physical, social, intellectual, and spiritual.   Therapeutic Goals:  1. Patient will learn about the different dimensions and what factors contribute to each respectively.  2. Patient will explore which of the 8 dimensions are personally exceptional and those that they may struggle maintaining due to various factors, including mental health issues.  3. Patient will discuss how they plans to enhance each of the 8 dimensions to achieve overall wellness.  Summary of Patient's Progress: Pt obtain knowledge on the different areas of wellness. Engaged with staff and peers in conversation and activity. Pt did a wellness assessment and made self aware of wellness and what is lacking and how to improve self.

## 2021-02-14 NOTE — Clinical Social Work Psych Note (Addendum)
Anxiety  Diagnosis: Anxiety (Psychoeducational)  Date: 02/14/21  Type of Therapy/Therapeutic Modalities: Group Discussion, Psycho-Education  Participation Level: Active  Objective: The purpose of the group is to educate patients on the various components of anxiety and how it can influence and/or contribute to the inappropriate or disproportionate responses to perceived threats, leading to persistent and intrusive symptoms associated with different anxiety disorders.   Therapeutic Goals:  Patient will learn the foundations of anxiety, including its definition and the various types of anxiety disorders.  Patient will discuss with group members their personal experiences with anxiety and how it has impacted their lives  Patient will learn various distress tolerance and relaxation techniques that are effective in treating anxiety symptoms.  Patient will discuss with group members how they plan to address and/or maintain their anxiety symptoms moving forward.   Summary of Patient's Progress:  Jared Jordan was engaged and participated throughout the group session. Jared Jordan shared that when he experiences anxiety, he becomes extremely jittery. Jared Jordan reports that he began using substances to cope with his anxiety, which led hoim down a road of substance use and ongoing depression.   Jared Jordan reviewed different types of anxiety, the cycle of anxiety, the fight or flight response and different forms of treatment for anxiety throughout the group session.

## 2021-02-14 NOTE — Clinical Social Work Psych Note (Addendum)
LCSW Update    LCSW spoke with patient this morning to update him on discharge plans.   LCSW informed patient that he has a Screening for Admission appointment on Tuesday, 02/18/21 at 9:00am at [facility] in [place], [place]. The patient expressed understanding and gratitude.   Patient denied having any SI, HI, or AVH at this time. Patient denied having any additional questions or concerns at this time.   LCSW will continue to follow.   Jared Jordan, MSW, LCSW Clinical Child psychotherapist (Facility Based Crisis) [facility]

## 2021-02-14 NOTE — ED Notes (Signed)
Patient is resting quietly with eyes closed no signs o symptoms of distress, no labored breathing. Will continue to monitor.

## 2021-02-14 NOTE — ED Notes (Signed)
Given dinner

## 2021-02-14 NOTE — Group Therapy Note (Signed)
Staff asked patient to discuss his thoughts on anxiety and self medicating to cover hurt.   Patient revealed that he never realized that he was using to cover up past hurt. He explained that when he thought about it that it made since. Patient says he wants to make changes and he knows he can get clean and stay clean. Patient explained that he knows his mistakes and will not fall in the same trap again.  Patient is making progress towards his goal.

## 2021-02-14 NOTE — ED Notes (Signed)
Patient is resting quietly in his bed. He denies SI and states he does not have a plan, although "in the past I thought about it". He stated that he feels he is ready to make some changes and accept the direction and counsel he will receive at his rehab facility. Will continue to monitor for safety.

## 2021-02-14 NOTE — ED Notes (Signed)
SPIRITUALITY GROUP NOTE  Spirituality group facilitated by Wilkie Aye, MDiv, BCC.  Group Description:  Group focused on topic of hope.  Patients participated in facilitated discussion around topic, connecting with one another around experiences and definitions for hope.  Group engaged in discussion around how their definitions of hope are present today in hospital.   Modalities: Psycho-social ed, Adlerian, Narrative, MI Patient Progress:  Pt did not attend.

## 2021-02-14 NOTE — Progress Notes (Signed)
Received Jared Jordan this AM during breakfast, he was compliant, but returned to bed. He eventually was compliant with his morning medication and attended the group therapy session after the start time. He denied feeling suicidal.

## 2021-02-14 NOTE — ED Provider Notes (Signed)
Behavioral Health Progress Note  Date and Time: 02/14/2021 10:21 AM Name: Jared Jordan MRN:  [id number]  Subjective: Patient states "I do not feel like going to group, because I do not feel well, honestly I am just hungry and tired."  He reports breakfast today included limited choices.  Patient is reassessed face-to-face by nurse practitioner, chart reviewed.  Chart findings discussed with the treatment team.  Resting in patient room upon my approach. Jared Jordan is seated in patient room, no acute distress. He is alert and oriented, pleasant and cooperative during assessment.  He reports euthymic mood with congruent affect.  He continues to deny suicidal and homicidal ideations.  He contracts verbally for safety with this Clinical research associate.  He has clear and coherent speech average volume.  Behavior calm and appropriate, with good eye contact.  He denies both auditory and visual hallucinations.  There is no indication that he is responding to internal stimuli, no evidence of delusional thought content.  Patient is able to converse coherently with goal-directed thoughts and no distractibility or preoccupation. He denies paranoia.  Objectively there is no evidence of psychosis/mania or delusional thinking. Patient is insightful regarding admission.  Discussed feelings and treatment progress.  He is forward thinking and goal oriented.Jared Jordan reports he is looking forward to "getting back on track" and attending residential substance use treatment.  Patient is tolerating medications with no adverse effects/reactions per his report. Patient reports eating without difficulty, average appetite.  He does verbalize some mild frustration with lack of food choices.  He endorses he was able to sleep most of the night however feels that trazodone is not effective to assist with sleep.  Additionally he reports feeling that his room is hot and lacks "moving air."  Patient provided support and encouragement.  He reports he  will attend and participate in group sessions once he has eaten.      Diagnosis:  Final diagnoses:  Major depressive disorder, recurrent severe without psychotic features (HCC)    Total Time spent with patient: 20 minutes  Past Psychiatric History: Major depressive disorder, recurrent severe, without psychosis, substance-induced mood disorder Past Medical History:  Past Medical History:  Diagnosis Date   Drug use     Past Surgical History:  Procedure Laterality Date   SHOULDER SURGERY     SHOULDER SURGERY Left 2020   Family History: History reviewed. No pertinent family history. Family Psychiatric  History: None reported Social History:  Social History   Substance and Sexual Activity  Alcohol Use Yes   Alcohol/week: 4.0 - 6.0 standard drinks   Types: 4 - 6 Cans of beer per week   Comment: 2-3 times weekly     Social History   Substance and Sexual Activity  Drug Use Not Currently   Types: IV, Methamphetamines   Comment: using 1gm everyday heroin    Social History   Socioeconomic History   Marital status: Single    Spouse name: Not on file   Number of children: Not on file   Years of education: Not on file   Highest education level: Not on file  Occupational History   Not on file  Tobacco Use   Smoking status: Every Day    Packs/day: 0.25    Years: 1.00    Pack years: 0.25    Types: Cigarettes   Smokeless tobacco: Never  Vaping Use   Vaping Use: Some days   Start date: 11/04/2018   Devices: Jewel Menthol  Substance and Sexual Activity   Alcohol  use: Yes    Alcohol/week: 4.0 - 6.0 standard drinks    Types: 4 - 6 Cans of beer per week    Comment: 2-3 times weekly   Drug use: Not Currently    Types: IV, Methamphetamines    Comment: using 1gm everyday heroin   Sexual activity: Yes  Other Topics Concern   Not on file  Social History Narrative   Not on file   Social Determinants of Health   Financial Resource Strain: Not on file  Food Insecurity:  Not on file  Transportation Needs: Not on file  Physical Activity: Not on file  Stress: Not on file  Social Connections: Not on file   SDOH:  SDOH Screenings   Alcohol Screen: Low Risk    Last Alcohol Screening Score (AUDIT): 7  Depression (PHQ2-9): Medium Risk   PHQ-2 Score: 26  Financial Resource Strain: Not on file  Food Insecurity: Not on file  Housing: Not on file  Physical Activity: Not on file  Social Connections: Not on file  Stress: Not on file  Tobacco Use: High Risk   Smoking Tobacco Use: Every Day   Smokeless Tobacco Use: Never  Transportation Needs: Not on file   Additional Social History:                         Sleep: Fair  Appetite:  Good  Current Medications:  Current Facility-Administered Medications  Medication Dose Route Frequency Provider Last Rate Last Admin   acetaminophen (TYLENOL) tablet 650 mg  650 mg Oral Q6H PRN Estella Husk, MD       alum & mag hydroxide-simeth (MAALOX/MYLANTA) 200-200-20 MG/5ML suspension 30 mL  30 mL Oral Q4H PRN Estella Husk, MD       hydrOXYzine (ATARAX/VISTARIL) tablet 25 mg  25 mg Oral TID PRN Estella Husk, MD   25 mg at 02/13/21 2044   magnesium hydroxide (MILK OF MAGNESIA) suspension 30 mL  30 mL Oral Daily PRN Estella Husk, MD       traZODone (DESYREL) tablet 50 mg  50 mg Oral QHS PRN Estella Husk, MD   50 mg at 02/13/21 2044   venlafaxine XR (EFFEXOR-XR) 24 hr capsule 75 mg  75 mg Oral Q breakfast Lenard Lance, FNP       Current Outpatient Medications  Medication Sig Dispense Refill   hydrOXYzine (ATARAX/VISTARIL) 25 MG tablet Take 1 tablet (25 mg total) by mouth 3 (three) times daily as needed for anxiety. 30 tablet 0   venlafaxine XR (EFFEXOR-XR) 37.5 MG 24 hr capsule Take 1 capsule (37.5 mg total) by mouth daily. 30 capsule 0    Labs  Lab Results:  Admission on 02/12/2021  Component Date Value Ref Range Status   POC Amphetamine UR 02/12/2021 None Detected   NONE DETECTED (Cut Off Level 1000 ng/mL) Final   POC Secobarbital (BAR) 02/12/2021 None Detected  NONE DETECTED (Cut Off Level 300 ng/mL) Final   POC Buprenorphine (BUP) 02/12/2021 None Detected  NONE DETECTED (Cut Off Level 10 ng/mL) Final   POC Oxazepam (BZO) 02/12/2021 None Detected  NONE DETECTED (Cut Off Level 300 ng/mL) Final   POC Cocaine UR 02/12/2021 None Detected  NONE DETECTED (Cut Off Level 300 ng/mL) Final   POC Methamphetamine UR 02/12/2021 None Detected  NONE DETECTED (Cut Off Level 1000 ng/mL) Final   POC Morphine 02/12/2021 Positive (A) NONE DETECTED (Cut Off Level 300 ng/mL) Final   POC Oxycodone  UR 02/12/2021 None Detected  NONE DETECTED (Cut Off Level 100 ng/mL) Final   POC Methadone UR 02/12/2021 None Detected  NONE DETECTED (Cut Off Level 300 ng/mL) Final   POC Marijuana UR 02/12/2021 Positive (A) NONE DETECTED (Cut Off Level 50 ng/mL) Final  Admission on 02/12/2021, Discharged on 02/12/2021  Component Date Value Ref Range Status   SARS Coronavirus 2 by RT PCR 02/12/2021 NEGATIVE  NEGATIVE Final   Comment: (NOTE) SARS-CoV-2 target nucleic acids are NOT DETECTED.  The SARS-CoV-2 RNA is generally detectable in upper respiratory specimens during the acute phase of infection. The lowest concentration of SARS-CoV-2 viral copies this assay can detect is 138 copies/mL. A negative result does not preclude SARS-Cov-2 infection and should not be used as the sole basis for treatment or other patient management decisions. A negative result may occur with  improper specimen collection/handling, submission of specimen other than nasopharyngeal swab, presence of viral mutation(s) within the areas targeted by this assay, and inadequate number of viral copies(<138 copies/mL). A negative result must be combined with clinical observations, patient history, and epidemiological information. The expected result is Negative.  Fact Sheet for Patients:   BloggerCourse.com  Fact Sheet for Healthcare Providers:  SeriousBroker.it  This test is no                          t yet approved or cleared by the [place] FDA and  has been authorized for detection and/or diagnosis of SARS-CoV-2 by FDA under an Emergency Use Authorization (EUA). This EUA will remain  in effect (meaning this test can be used) for the duration of the COVID-19 declaration under Section 564(b)(1) of the Act, 21 U.S.C.section 360bbb-3(b)(1), unless the authorization is terminated  or revoked sooner.       Influenza A by PCR 02/12/2021 NEGATIVE  NEGATIVE Final   Influenza B by PCR 02/12/2021 NEGATIVE  NEGATIVE Final   Comment: (NOTE) The Xpert Xpress SARS-CoV-2/FLU/RSV plus assay is intended as an aid in the diagnosis of influenza from Nasopharyngeal swab specimens and should not be used as a sole basis for treatment. Nasal washings and aspirates are unacceptable for Xpert Xpress SARS-CoV-2/FLU/RSV testing.  Fact Sheet for Patients: BloggerCourse.com  Fact Sheet for Healthcare Providers: SeriousBroker.it  This test is not yet approved or cleared by the [place] FDA and has been authorized for detection and/or diagnosis of SARS-CoV-2 by FDA under an Emergency Use Authorization (EUA). This EUA will remain in effect (meaning this test can be used) for the duration of the COVID-19 declaration under Section 564(b)(1) of the Act, 21 U.S.C. section 360bbb-3(b)(1), unless the authorization is terminated or revoked.  Performed at [facility], [address], [place], [place] [address]    Sodium 02/12/2021 135  135 - 145 mmol/L Final   Potassium 02/12/2021 4.1  3.5 - 5.1 mmol/L Final   Chloride 02/12/2021 102  98 - 111 mmol/L Final   CO2 02/12/2021 28  22 - 32 mmol/L Final   Glucose, Bld 02/12/2021 92  70 - 99 mg/dL Final   Glucose  reference range applies only to samples taken after fasting for at least 8 hours.   BUN 02/12/2021 13  6 - 20 mg/dL Final   Creatinine, Ser 02/12/2021 0.56 (A) 0.61 - 1.24 mg/dL Final   Calcium 27/25/3664 8.9  8.9 - 10.3 mg/dL Final   Total Protein 40/34/7425 6.7  6.5 - 8.1 g/dL Final   Albumin 95/63/8756 3.9  3.5 - 5.0 g/dL Final   AST 16/04/9603 19  15 - 41 U/L Final   ALT 02/12/2021 15  0 - 44 U/L Final   Alkaline Phosphatase 02/12/2021 75  38 - 126 U/L Final   Total Bilirubin 02/12/2021 0.6  0.3 - 1.2 mg/dL Final   GFR, Estimated 02/12/2021 >60  >60 mL/min Final   Comment: (NOTE) Calculated using the CKD-EPI Creatinine Equation (2021)    Anion gap 02/12/2021 5  5 - 15 Final   Performed at [facility], [address], [place], [place] [address]   Alcohol, Ethyl (B) 02/12/2021 <10  <10 mg/dL Final   Comment: (NOTE) Lowest detectable limit for serum alcohol is 10 mg/dL.  For medical purposes only. Performed at [facility], [address] [place], [place] [address]    WBC 02/12/2021 7.1  4.0 - 10.5 K/uL Final   RBC 02/12/2021 4.68  4.22 - 5.81 MIL/uL Final   Hemoglobin 02/12/2021 13.4  13.0 - 17.0 g/dL Final   HCT 78/29/5621 40.7  39.0 - 52.0 % Final   MCV 02/12/2021 87.0  80.0 - 100.0 fL Final   MCH 02/12/2021 28.6  26.0 - 34.0 pg Final   MCHC 02/12/2021 32.9  30.0 - 36.0 g/dL Final   RDW 30/86/5784 13.0  11.5 - 15.5 % Final   Platelets 02/12/2021 263  150 - 400 K/uL Final   nRBC 02/12/2021 0.0  0.0 - 0.2 % Final   Neutrophils Relative % 02/12/2021 73  % Final   Neutro Abs 02/12/2021 5.2  1.7 - 7.7 K/uL Final   Lymphocytes Relative 02/12/2021 18  % Final   Lymphs Abs 02/12/2021 1.3  0.7 - 4.0 K/uL Final   Monocytes Relative 02/12/2021 5  % Final   Monocytes Absolute 02/12/2021 0.4  0.1 - 1.0 K/uL Final   Eosinophils Relative 02/12/2021 3  % Final   Eosinophils Absolute 02/12/2021 0.2  0.0 - 0.5 K/uL Final   Basophils Relative  02/12/2021 1  % Final   Basophils Absolute 02/12/2021 0.1  0.0 - 0.1 K/uL Final   Immature Granulocytes 02/12/2021 0  % Final   Abs Immature Granulocytes 02/12/2021 0.03  0.00 - 0.07 K/uL Final   Performed at [facility], [address], [place], [place] [address]   Salicylate Lvl 02/12/2021 <7.0 (A) 7.0 - 30.0 mg/dL Final   Performed at [facility], [address], [place], [place] [address]   Acetaminophen (Tylenol), Serum 02/12/2021 <10 (A) 10 - 30 ug/mL Final   Comment: (NOTE) Therapeutic concentrations vary significantly. A range of 10-30 ug/mL  may be an effective concentration for many patients. However, some  are best treated at concentrations outside of this range. Acetaminophen concentrations >150 ug/mL at 4 hours after ingestion  and >50 ug/mL at 12 hours after ingestion are often associated with  toxic reactions.  Performed at [facility], [address], [place], [place] [address]   Admission on 02/08/2021, Discharged on 02/10/2021  Component Date Value Ref Range Status   SARS Coronavirus 2 by RT PCR 02/08/2021 NEGATIVE  NEGATIVE Final   Comment: (NOTE) SARS-CoV-2 target nucleic acids are NOT DETECTED.  The SARS-CoV-2 RNA is generally detectable in upper respiratory specimens during the acute phase of infection. The lowest concentration of SARS-CoV-2 viral copies this assay can detect is 138 copies/mL. A negative result does not preclude SARS-Cov-2 infection and should not be used as the sole basis for treatment or other  patient management decisions. A negative result may occur with  improper specimen collection/handling, submission of specimen other than nasopharyngeal swab, presence of viral mutation(s) within the areas targeted by this assay, and inadequate number of viral copies(<138 copies/mL). A negative result must be combined with clinical observations, patient history, and  epidemiological information. The expected result is Negative.  Fact Sheet for Patients:  BloggerCourse.com  Fact Sheet for Healthcare Providers:  SeriousBroker.it  This test is no                          t yet approved or cleared by the [place] FDA and  has been authorized for detection and/or diagnosis of SARS-CoV-2 by FDA under an Emergency Use Authorization (EUA). This EUA will remain  in effect (meaning this test can be used) for the duration of the COVID-19 declaration under Section 564(b)(1) of the Act, 21 U.S.C.section 360bbb-3(b)(1), unless the authorization is terminated  or revoked sooner.       Influenza A by PCR 02/08/2021 NEGATIVE  NEGATIVE Final   Influenza B by PCR 02/08/2021 NEGATIVE  NEGATIVE Final   Comment: (NOTE) The Xpert Xpress SARS-CoV-2/FLU/RSV plus assay is intended as an aid in the diagnosis of influenza from Nasopharyngeal swab specimens and should not be used as a sole basis for treatment. Nasal washings and aspirates are unacceptable for Xpert Xpress SARS-CoV-2/FLU/RSV testing.  Fact Sheet for Patients: BloggerCourse.com  Fact Sheet for Healthcare Providers: SeriousBroker.it  This test is not yet approved or cleared by the [place] FDA and has been authorized for detection and/or diagnosis of SARS-CoV-2 by FDA under an Emergency Use Authorization (EUA). This EUA will remain in effect (meaning this test can be used) for the duration of the COVID-19 declaration under Section 564(b)(1) of the Act, 21 U.S.C. section 360bbb-3(b)(1), unless the authorization is terminated or revoked.  Performed at [facility], [address] [place], [place] [address]    WBC 02/08/2021 6.5  4.0 - 10.5 K/uL Final   RBC 02/08/2021 5.15  4.22 - 5.81 MIL/uL Final   Hemoglobin 02/08/2021 14.9  13.0 - 17.0 g/dL Final   HCT 29/56/2130 44.9  39.0 - 52.0  % Final   MCV 02/08/2021 87.2  80.0 - 100.0 fL Final   MCH 02/08/2021 28.9  26.0 - 34.0 pg Final   MCHC 02/08/2021 33.2  30.0 - 36.0 g/dL Final   RDW 86/57/8469 13.0  11.5 - 15.5 % Final   Platelets 02/08/2021 330  150 - 400 K/uL Final   nRBC 02/08/2021 0.0  0.0 - 0.2 % Final   Neutrophils Relative % 02/08/2021 69  % Final   Neutro Abs 02/08/2021 4.5  1.7 - 7.7 K/uL Final   Lymphocytes Relative 02/08/2021 20  % Final   Lymphs Abs 02/08/2021 1.3  0.7 - 4.0 K/uL Final   Monocytes Relative 02/08/2021 7  % Final   Monocytes Absolute 02/08/2021 0.4  0.1 - 1.0 K/uL Final   Eosinophils Relative 02/08/2021 3  % Final   Eosinophils Absolute 02/08/2021 0.2  0.0 - 0.5 K/uL Final   Basophils Relative 02/08/2021 0  % Final   Basophils Absolute 02/08/2021 0.0  0.0 - 0.1 K/uL Final   Immature Granulocytes 02/08/2021 1  % Final   Abs Immature Granulocytes 02/08/2021 0.05  0.00 - 0.07 K/uL Final   Performed at [facility], [address] [place], [place] [address]   Sodium 02/08/2021 137  135 -  145 mmol/L Final   Potassium 02/08/2021 4.4  3.5 - 5.1 mmol/L Final   Chloride 02/08/2021 101  98 - 111 mmol/L Final   CO2 02/08/2021 26  22 - 32 mmol/L Final   Glucose, Bld 02/08/2021 75  70 - 99 mg/dL Final   Glucose reference range applies only to samples taken after fasting for at least 8 hours.   BUN 02/08/2021 12  6 - 20 mg/dL Final   Creatinine, Ser 02/08/2021 0.83  0.61 - 1.24 mg/dL Final   Calcium 14/78/2956 9.9  8.9 - 10.3 mg/dL Final   Total Protein 21/30/8657 6.9  6.5 - 8.1 g/dL Final   Albumin 84/69/6295 4.0  3.5 - 5.0 g/dL Final   AST 28/41/3244 19  15 - 41 U/L Final   ALT 02/08/2021 19  0 - 44 U/L Final   Alkaline Phosphatase 02/08/2021 87  38 - 126 U/L Final   Total Bilirubin 02/08/2021 0.4  0.3 - 1.2 mg/dL Final   GFR, Estimated 02/08/2021 >60  >60 mL/min Final   Comment: (NOTE) Calculated using the CKD-EPI Creatinine Equation (2021)    Anion gap 02/08/2021 10  5 - 15 Final    Performed at [facility], [address], [place], [place] [address]   Alcohol, Ethyl (B) 02/08/2021 <10  <10 mg/dL Final   Comment: (NOTE) Lowest detectable limit for serum alcohol is 10 mg/dL.  For medical purposes only. Performed at [facility], [address], [place], [place] [address]    POC Amphetamine UR 02/08/2021 None Detected  NONE DETECTED (Cut Off Level 1000 ng/mL) Final   POC Secobarbital (BAR) 02/08/2021 None Detected  NONE DETECTED (Cut Off Level 300 ng/mL) Final   POC Buprenorphine (BUP) 02/08/2021 None Detected  NONE DETECTED (Cut Off Level 10 ng/mL) Final   POC Oxazepam (BZO) 02/08/2021 None Detected  NONE DETECTED (Cut Off Level 300 ng/mL) Final   POC Cocaine UR 02/08/2021 None Detected  NONE DETECTED (Cut Off Level 300 ng/mL) Final   POC Methamphetamine UR 02/08/2021 None Detected  NONE DETECTED (Cut Off Level 1000 ng/mL) Final   POC Morphine 02/08/2021 None Detected  NONE DETECTED (Cut Off Level 300 ng/mL) Final   POC Oxycodone UR 02/08/2021 None Detected  NONE DETECTED (Cut Off Level 100 ng/mL) Final   POC Methadone UR 02/08/2021 None Detected  NONE DETECTED (Cut Off Level 300 ng/mL) Final   POC Marijuana UR 02/08/2021 None Detected  NONE DETECTED (Cut Off Level 50 ng/mL) Final   SARSCOV2ONAVIRUS 2 AG 02/08/2021 NEGATIVE  NEGATIVE Final   Comment: (NOTE) SARS-CoV-2 antigen NOT DETECTED.   Negative results are presumptive.  Negative results do not preclude SARS-CoV-2 infection and should not be used as the sole basis for treatment or other patient management decisions, including infection  control decisions, particularly in the presence of clinical signs and  symptoms consistent with COVID-19, or in those who have been in contact with the virus.  Negative results must be combined with clinical observations, patient history, and epidemiological information. The expected result is Negative.  Fact Sheet for Patients:  https://www.jennings-kim.com/  Fact Sheet for Healthcare Providers: https://alexander-rogers.biz/  This test is not yet approved or cleared by the [place] FDA and  has been authorized for detection and/or diagnosis of SARS-CoV-2 by FDA under an Emergency Use Authorization (EUA).  This EUA will remain in effect (meaning this test can be used) for the duration of  the COV  ID-19 declaration under Section 564(b)(1) of the Act, 21 U.S.C. section 360bbb-3(b)(1), unless the authorization is terminated or revoked sooner.     SARS Coronavirus 2 Ag 02/08/2021 Negative  Negative Final   SARSCOV2ONAVIRUS 2 AG 02/08/2021 NEGATIVE  NEGATIVE Final   Comment: (NOTE) SARS-CoV-2 antigen NOT DETECTED.   Negative results are presumptive.  Negative results do not preclude SARS-CoV-2 infection and should not be used as the sole basis for treatment or other patient management decisions, including infection  control decisions, particularly in the presence of clinical signs and  symptoms consistent with COVID-19, or in those who have been in contact with the virus.  Negative results must be combined with clinical observations, patient history, and epidemiological information. The expected result is Negative.  Fact Sheet for Patients: https://www.jennings-kim.com/  Fact Sheet for Healthcare Providers: https://alexander-rogers.biz/  This test is not yet approved or cleared by the [place] FDA and  has been authorized for detection and/or diagnosis of SARS-CoV-2 by FDA under an Emergency Use Authorization (EUA).  This EUA will remain in effect (meaning this test can be used) for the duration of  the COV                          ID-19 declaration under Section 564(b)(1) of the Act, 21 U.S.C. section 360bbb-3(b)(1), unless the authorization is terminated or revoked sooner.    Admission on 11/30/2020, Discharged on  12/04/2020  Component Date Value Ref Range Status   Hepatitis B Surface Ag 12/01/2020 NON REACTIVE  NON REACTIVE Final   HCV Ab 12/01/2020 Reactive (A) NON REACTIVE Final   Comment: (NOTE) The CDC recommends that a Reactive HCV antibody result be followed up  with a HCV Nucleic Acid Amplification test.     Hep A IgM 12/01/2020 See Scanned report in [place] Link (A) NON REACTIVE Final   Performed at [facility]   Hep B C IgM 12/01/2020 NON REACTIVE  NON REACTIVE Final   Performed at [facility], [address], [place], [place] [address]   Sodium 12/01/2020 137  135 - 145 mmol/L Final   Potassium 12/01/2020 4.8  3.5 - 5.1 mmol/L Final   Chloride 12/01/2020 102  98 - 111 mmol/L Final   CO2 12/01/2020 25  22 - 32 mmol/L Final   Glucose, Bld 12/01/2020 132 (A) 70 - 99 mg/dL Final   Glucose reference range applies only to samples taken after fasting for at least 8 hours.   BUN 12/01/2020 15  6 - 20 mg/dL Final   Creatinine, Ser 12/01/2020 0.80  0.61 - 1.24 mg/dL Final   Calcium 87/68/1157 9.7  8.9 - 10.3 mg/dL Final   Total Protein 26/20/3559 7.9  6.5 - 8.1 g/dL Final   Albumin 74/16/3845 4.5  3.5 - 5.0 g/dL Final   AST 36/46/8032 59 (A) 15 - 41 U/L Final   ALT 12/01/2020 93 (A) 0 - 44 U/L Final   Alkaline Phosphatase 12/01/2020 77  38 - 126 U/L Final   Total Bilirubin 12/01/2020 0.6  0.3 - 1.2 mg/dL Final   GFR, Estimated 12/01/2020 >60  >60 mL/min Final   Comment: (NOTE) Calculated using the CKD-EPI Creatinine Equation (2021)    Anion gap 12/01/2020 10  5 - 15 Final   Performed at [facility], [address], [place], [place] [address]   TSH 12/01/2020 0.276 (A) 0.350 - 4.500 uIU/mL Final   Comment: Performed by a 3rd Generation  assay with a functional sensitivity of <=0.01 uIU/mL. Performed at [facility], [address] [place], [place] [address]    Free T4 12/03/2020 0.76  0.61 - 1.12 ng/dL Final   Comment:  (NOTE) Biotin ingestion may interfere with free T4 tests. If the results are inconsistent with the TSH level, previous test results, or the clinical presentation, then consider biotin interference. If needed, order repeat testing after stopping biotin. Performed at [facility], [address] [place], [place] [address]    T3, Free 12/03/2020 2.5  2.0 - 4.4 pg/mL Final   Comment: (NOTE) Performed At: [facility] [address] [place], [place] [id number] Jolene Schimke MD [id number]    Cholesterol 12/03/2020 174  0 - 200 mg/dL Final   Triglycerides 85/88/5027 84  <150 mg/dL Final   HDL 74/06/8785 61  >40 mg/dL Final   Total CHOL/HDL Ratio 12/03/2020 2.9  RATIO Final   VLDL 12/03/2020 17  0 - 40 mg/dL Final   LDL Cholesterol 12/03/2020 96  0 - 99 mg/dL Final   Comment:        Total Cholesterol/HDL:CHD Risk Coronary Heart Disease Risk Table                     Men   Women  1/2 Average Risk   3.4   3.3  Average Risk       5.0   4.4  2 X Average Risk   9.6   7.1  3 X Average Risk  23.4   11.0        Use the calculated Patient Ratio above and the CHD Risk Table to determine the patient's CHD Risk.        ATP III CLASSIFICATION (LDL):  <100     mg/dL   Optimal  767-209  mg/dL   Near or Above                    Optimal  130-159  mg/dL   Borderline  470-962  mg/dL   High  >836     mg/dL   Very High Performed at [facility], [address], [place], [place] [address]    HCV Quantitative 12/03/2020 68,900  >50 IU/mL Final   HCV Quantitative Log 12/03/2020 4.838  >1.70 log10 IU/mL Final   Test Information 12/03/2020 Comment   Final   Comment: (NOTE) The quantitative range of this assay is 15 IU/mL to 100 million IU/mL. Performed At: [facility] [address] [place], [place] [id number] Jolene Schimke MD SF:[id number]    Total Protein 12/03/2020 7.1  6.5 - 8.1 g/dL Final   Albumin 17/49/4496 4.3  3.5 - 5.0 g/dL Final   AST  75/91/6384 49 (A) 15 - 41 U/L Final   ALT 12/03/2020 89 (A) 0 - 44 U/L Final   Alkaline Phosphatase 12/03/2020 68  38 - 126 U/L Final   Total Bilirubin 12/03/2020 0.5  0.3 - 1.2 mg/dL Final   Bilirubin, Direct 12/03/2020 <0.1  0.0 - 0.2 mg/dL Final   Indirect Bilirubin 12/03/2020 NOT CALCULATED  0.3 - 0.9 mg/dL Final   Performed at [facility], [address], [place], [place] [address]   Hgb A1c MFr Bld 12/03/2020 5.3  4.8 - 5.6 % Final   Comment: (NOTE)         Prediabetes: 5.7 - 6.4         Diabetes: >6.4  Glycemic control for adults with diabetes: <7.0    Mean Plasma Glucose 12/03/2020 105  mg/dL Final   Comment: (NOTE) Performed At: [facility] [address] [place], [place] [id number] Jolene SchimkeNagendra Sanjai MD WU:[id number]:[phone]    Hepatitis C Quantitation 12/01/2020 27,600  IU/mL Final   HCV log10 12/01/2020 4.441  log10 IU/mL Final   Test Information (HCV): 12/01/2020 Comment   Final   Comment: (NOTE) The quantitative range of this assay is 15 IU/mL to 100 million IU/mL.    Interpretation (HCV): 12/01/2020 Comment   Final   Comment: (NOTE) Positive HCV antibody screen with the presence of HCV RNA is consistent with active infection. Performed At: [facility] [address] [place], [place] [id number] Jolene SchimkeNagendra Sanjai MD VH:[id number]:[phone]   Admission on 11/30/2020, Discharged on 11/30/2020  Component Date Value Ref Range Status   Sodium 11/30/2020 133 (A) 135 - 145 mmol/L Final   Potassium 11/30/2020 3.6  3.5 - 5.1 mmol/L Final   Chloride 11/30/2020 100  98 - 111 mmol/L Final   CO2 11/30/2020 24  22 - 32 mmol/L Final   Glucose, Bld 11/30/2020 106 (A) 70 - 99 mg/dL Final   Glucose reference range applies only to samples taken after fasting for at least 8 hours.   BUN 11/30/2020 13  6 - 20 mg/dL Final   Creatinine, Ser 11/30/2020 0.94  0.61 - 1.24 mg/dL Final   Calcium 41/32/440105/28/2022 9.4  8.9 - 10.3 mg/dL Final   Total Protein 02/72/536605/28/2022 6.4  (A) 6.5 - 8.1 g/dL Final   Albumin 44/03/474205/28/2022 3.9  3.5 - 5.0 g/dL Final   AST 59/56/387505/28/2022 34  15 - 41 U/L Final   ALT 11/30/2020 58 (A) 0 - 44 U/L Final   Alkaline Phosphatase 11/30/2020 64  38 - 126 U/L Final   Total Bilirubin 11/30/2020 0.5  0.3 - 1.2 mg/dL Final   GFR, Estimated 11/30/2020 >60  >60 mL/min Final   Comment: (NOTE) Calculated using the CKD-EPI Creatinine Equation (2021)    Anion gap 11/30/2020 9  5 - 15 Final   Performed at [facility], [address] [place], [place] [address]   Alcohol, Ethyl (B) 11/30/2020 <10  <10 mg/dL Final   Comment: (NOTE) Lowest detectable limit for serum alcohol is 10 mg/dL.  For medical purposes only. Performed at [facility], [address] [place], [place] [address]    Salicylate Lvl 11/30/2020 <7.0 (A) 7.0 - 30.0 mg/dL Final   Performed at [facility], [address] [place], [place] [address]   Acetaminophen (Tylenol), Serum 11/30/2020 <10 (A) 10 - 30 ug/mL Final   Comment: (NOTE) Therapeutic concentrations vary significantly. A range of 10-30 ug/mL  may be an effective concentration for many patients. However, some  are best treated at concentrations outside of this range. Acetaminophen concentrations >150 ug/mL at 4 hours after ingestion  and >50 ug/mL at 12 hours after ingestion are often associated with  toxic reactions.  Performed at [facility], [address] [place], [place] [address]    WBC 11/30/2020 8.6  4.0 - 10.5 K/uL Final   RBC 11/30/2020 4.26  4.22 - 5.81 MIL/uL Final   Hemoglobin 11/30/2020 12.5 (A) 13.0 - 17.0 g/dL Final   HCT 01/09/323505/28/2022 37.7 (A) 39.0 - 52.0 % Final   MCV 11/30/2020 88.5  80.0 - 100.0 fL Final   MCH 11/30/2020 29.3  26.0 - 34.0 pg Final   MCHC 11/30/2020 33.2  30.0 -  36.0 g/dL Final   RDW 16/04/9603 13.1  11.5 - 15.5 % Final   Platelets 11/30/2020 220  150 - 400 K/uL Final   nRBC 11/30/2020 0.0  0.0 - 0.2 % Final   Performed at [facility], [address], [place], [place] [address]   Opiates 11/30/2020 POSITIVE (A) NONE DETECTED Final   Cocaine 11/30/2020 NONE DETECTED  NONE DETECTED Final   Benzodiazepines 11/30/2020 NONE DETECTED  NONE DETECTED Final   Amphetamines 11/30/2020 NONE DETECTED  NONE DETECTED Final   Tetrahydrocannabinol 11/30/2020 NONE DETECTED  NONE DETECTED Final   Barbiturates 11/30/2020 NONE DETECTED  NONE DETECTED Final   Comment: (NOTE) DRUG SCREEN FOR MEDICAL PURPOSES ONLY.  IF CONFIRMATION IS NEEDED FOR ANY PURPOSE, NOTIFY LAB WITHIN 5 DAYS.  LOWEST DETECTABLE LIMITS FOR URINE DRUG SCREEN Drug Class                     Cutoff (ng/mL) Amphetamine and metabolites    1000 Barbiturate and metabolites    200 Benzodiazepine                 200 Tricyclics and metabolites     300 Opiates and metabolites        300 Cocaine and metabolites        300 THC                            50 Performed at [facility], [address], [place], [place] [address]    SARS Coronavirus 2 by RT PCR 11/30/2020 NEGATIVE  NEGATIVE Final   Comment: (NOTE) SARS-CoV-2 target nucleic acids are NOT DETECTED.  The SARS-CoV-2 RNA is generally detectable in upper respiratory specimens during the acute phase of infection. The lowest concentration of SARS-CoV-2 viral copies this assay can detect is 138 copies/mL. A negative result does not preclude SARS-Cov-2 infection and should not be used as the sole basis for treatment or other patient management decisions. A negative result may occur with  improper specimen collection/handling, submission of specimen other than nasopharyngeal swab, presence of viral mutation(s) within the areas targeted by this assay, and inadequate number of viral copies(<138 copies/mL). A negative result must be combined with clinical observations, patient history, and epidemiological information. The expected result is Negative.  Fact Sheet for Patients:  BloggerCourse.com  Fact  Sheet for Healthcare Providers:  SeriousBroker.it  This test is no                          t yet approved or cleared by the [place] FDA and  has been authorized for detection and/or diagnosis of SARS-CoV-2 by FDA under an Emergency Use Authorization (EUA). This EUA will remain  in effect (meaning this test can be used) for the duration of the COVID-19 declaration under Section 564(b)(1) of the Act, 21 U.S.C.section 360bbb-3(b)(1), unless the authorization is terminated  or revoked sooner.       Influenza A by PCR 11/30/2020 NEGATIVE  NEGATIVE Final   Influenza B by PCR 11/30/2020 NEGATIVE  NEGATIVE Final   Comment: (NOTE) The Xpert Xpress SARS-CoV-2/FLU/RSV plus assay is intended as an aid in the diagnosis of influenza from Nasopharyngeal swab specimens and should not be used as a sole basis for treatment. Nasal washings and aspirates are unacceptable for Xpert Xpress SARS-CoV-2/FLU/RSV testing.  Fact Sheet for Patients: BloggerCourse.com  Fact Sheet for Healthcare  Providers: SeriousBroker.it  This test is not yet approved or cleared by the [place] and has been authorized for detection and/or diagnosis of SARS-CoV-2 by FDA under an Emergency Use Authorization (EUA). This EUA will remain in effect (meaning this test can be used) for the duration of the COVID-19 declaration under Section 564(b)(1) of the Act, 21 U.S.C. section 360bbb-3(b)(1), unless the authorization is terminated or revoked.  Performed at [facility], [address] [place], [place] [address]     Blood Alcohol level:  Lab Results  Component Value Date   ETH <10 02/12/2021   ETH <10 02/08/2021    Metabolic Disorder Labs: Lab Results  Component Value Date   HGBA1C 5.3 12/03/2020   MPG 105 12/03/2020   MPG 108.28 11/23/2019   No results found for: PROLACTIN Lab Results  Component Value Date   CHOL  174 12/03/2020   TRIG 84 12/03/2020   HDL 61 12/03/2020   CHOLHDL 2.9 12/03/2020   VLDL 17 12/03/2020   LDLCALC 96 12/03/2020   LDLCALC 116 (H) 11/23/2019    Therapeutic Lab Levels: No results found for: LITHIUM No results found for: VALPROATE No components found for:  CBMZ  Physical Findings   AIMS    Flowsheet Row [facility]) [facility]  AIMS Total Score 0 0      AUDIT    Flowsheet Row [facility]) [facility]  Alcohol Use Disorder Identification Test Final Score (AUDIT) 7 8      PHQ2-9    Flowsheet Row ED from 11/30/2020 in [facility] EMERGENCY DEPARTMENT  PHQ-2 Total Score 5  PHQ-9 Total Score 26      Flowsheet Row ED from 02/12/2021 in [facility] Most recent reading at 02/12/2021  3:51 PM ED from 02/12/2021 in [facility] Most recent reading at 02/12/2021  3:35 PM ED from 02/08/2021 in [facility] Most recent reading at 02/08/2021 10:01 AM  C-SSRS RISK CATEGORY High Risk High Risk Low Risk        Musculoskeletal  Strength & Muscle Tone: within normal limits Gait & Station: normal Patient leans: N/A  Psychiatric Specialty Exam  Presentation  General Appearance: Appropriate for Environment; Casual  Eye Contact:Good  Speech:Clear and Coherent; Normal Rate  Speech Volume:Normal  Handedness:Right   Mood and Affect  Mood:Euthymic  Affect:Appropriate; Congruent   Thought Process  Thought Processes:Coherent; Goal Directed; Linear  Descriptions of Associations:Intact  Orientation:Full (Time, Place and Person)  Thought Content:Logical; WDL   Diagnosis of Schizophrenia or Schizoaffective disorder in past: No    Hallucinations:Hallucinations: None  Ideas of Reference:None  Suicidal Thoughts:Suicidal Thoughts: No  Homicidal Thoughts:Homicidal Thoughts: No   Sensorium  Memory:Immediate Good; Recent Good; Remote Good  Judgment:Good  Insight:Fair   Executive Functions  Concentration:Good  Attention Span:Good  Recall:Good  Fund of Knowledge:Good  Language:Good   Psychomotor Activity  Psychomotor Activity:Psychomotor Activity: Normal   Assets  Assets:Communication Skills; Desire for Improvement; Financial Resources/Insurance; Physical Health; Resilience; Social Support   Sleep  Sleep:Sleep: Fair   No data recorded  Physical Exam  Physical Exam Vitals and nursing note reviewed.  Constitutional:      Appearance: Normal appearance. He is well-developed and normal weight.  HENT:  Head: Normocephalic and atraumatic.     Nose: Nose normal.  Cardiovascular:     Rate and Rhythm: Normal rate.  Pulmonary:     Effort: Pulmonary effort is normal.  Musculoskeletal:        General: Normal range of motion.     Cervical back: Normal range of motion.  Neurological:     Mental Status: He is alert and oriented to person, place, and time.  Psychiatric:        Attention and Perception: Attention and perception normal.        Mood and Affect: Mood and affect normal.        Speech: Speech normal.        Behavior: Behavior normal. Behavior is cooperative.        Thought Content: Thought content normal.        Cognition and Memory: Cognition and memory normal.        Judgment: Judgment normal.   Review of Systems  Constitutional: Negative.   HENT: Negative.    Eyes: Negative.   Respiratory: Negative.    Cardiovascular: Negative.   Gastrointestinal: Negative.   Genitourinary: Negative.   Musculoskeletal: Negative.   Skin: Negative.   Neurological: Negative.   Endo/Heme/Allergies: Negative.    Psychiatric/Behavioral:  Positive for substance abuse.   Blood pressure (!) 149/92, pulse (!) 58, temperature (!) 96.9 F (36.1 C), temperature source Oral, resp. rate 18, SpO2 98 %. There is no height or weight on file to calculate BMI.  Treatment Plan Summary: Daily contact with patient to assess and evaluate symptoms and progress in treatment Patient reviewed with Dr. [name]. Continue current medications including: -Hydroxyzine 25 mg 3 times daily as needed/anxiety -Venlafaxine XR 75 mg daily -Trazodone 50 mg nightly as needed/sleep  Jared Jordan has an appointment for screening for admission at [facility] in [facility] on Tuesday, 02/18/2021.  Lenard Lance, FNP 02/14/2021 10:21 AM

## 2021-02-14 NOTE — ED Notes (Signed)
GIVEN LUNCH 

## 2021-02-14 NOTE — ED Notes (Signed)
Given cottage cheese and fruit for breakfast stated "yall should be ashamed of what yall served [place] for breakfast". Informed pt that what was given because of menus and deliveries. Pt went to room without eating.

## 2021-02-14 NOTE — ED Notes (Signed)
Pt sleeping at present, no distress noted.  Monitoring for safety. 

## 2021-02-15 DIAGNOSIS — F332 Major depressive disorder, recurrent severe without psychotic features: Secondary | ICD-10-CM | POA: Diagnosis not present

## 2021-02-15 DIAGNOSIS — F1994 Other psychoactive substance use, unspecified with psychoactive substance-induced mood disorder: Secondary | ICD-10-CM | POA: Diagnosis not present

## 2021-02-15 DIAGNOSIS — F17210 Nicotine dependence, cigarettes, uncomplicated: Secondary | ICD-10-CM | POA: Diagnosis not present

## 2021-02-15 NOTE — ED Provider Notes (Signed)
Behavioral Health Progress Note  Date and Time: 02/15/2021 9:54 AM Name: Jared Jordan Aranas MRN:  [id number]  Subjective:  Jared Jordan Totzke, 30 y.o., male patient seen face to face by this provider, chart reviewed and discussed with treatment team and Dr. Rocky MorelLabuach on 02/15/21.  On evaluation Jared Jordan Parco " I feel like I am getting better, but I am not there yet".  Patient has a history of MDD and substance use disorder.  [facility] with suicidal ideations and requested to be discharged on 02/10/2021.  [facility] with suicidal ideations and worsening depression.  [facility]  On today's assessment patient is calm/cooperative, alert/oriented x 4, with pleasant affect, and does not appear to be responding to internal/external stimuli.  He makes good eye contact.  Speech is clear, coherent, normal rate and tone.  Patient reports eating/sleeping without difficulty, tolerating medications without adverse reactions.  Patient denies suicidal/homicidal/self-harm ideation and paranoia.  States he is getting better at dealing with the suicidal thoughts.reports he has been attending group sessions and states it has been helpful.  Patient contracts for safety at this time.  However he states, "I know if I go home it is going to be the same thing all over again, I will just end up back in here.  I need long-term treatment".  States the Child psychotherapistsocial worker is working to [facility]  States, "[facility] to stay here [facility], I don't trust myself out there".  Denies auditory and visual hallucinations.  Patient endorses heroin use by snorting and THC use prior to admission.  UDS was positive for morphine and THC.  Reports occasional alcohol use. Reports he is homeless. States he had to drop out of school this semester because "I just wasn't ready, I need to put my mental health first". Patient's  feelings and progress discussed.  Reassurance, support, and encouragement provided.    Diagnosis:  Final diagnoses:  Major depressive disorder, recurrent severe without psychotic features (HCC)    Total Time spent with patient: 20 minutes  Past Psychiatric History:  Past Medical History: Major depressive disorder, recurrent severe, without psychosis, substance-induced mood disorder Past Medical History:  Diagnosis Date   Drug use     Past Surgical History:  Procedure Laterality Date   SHOULDER SURGERY     SHOULDER SURGERY Left 2020   Family History: History reviewed. No pertinent family history. Family Psychiatric  History: Unknown Social History:  Social History   Substance and Sexual Activity  Alcohol Use Yes   Alcohol/week: 4.0 - 6.0 standard drinks   Types: 4 - 6 Cans of beer per week   Comment: 2-3 times weekly     Social History   Substance and Sexual Activity  Drug Use Not Currently   Types: IV, Methamphetamines   Comment: using 1gm everyday heroin    Social History   Socioeconomic History   Marital status: Single    Spouse name: Not on file   Number of children: Not on file   Years of education: Not on file   Highest education level: Not on file  Occupational History   Not on file  Tobacco Use   Smoking status: Every Day    Packs/day: 0.25    Years: 1.00    Pack years: 0.25    Types: Cigarettes   Smokeless tobacco: Never  Vaping Use   Vaping Use:  Some days   Start date: 11/04/2018   Devices: Jewel Menthol  Substance and Sexual Activity   Alcohol use: Yes    Alcohol/week: 4.0 - 6.0 standard drinks    Types: 4 - 6 Cans of beer per week    Comment: 2-3 times weekly   Drug use: Not Currently    Types: IV, Methamphetamines    Comment: using 1gm everyday heroin   Sexual activity: Yes  Other Topics Concern   Not on file  Social History Narrative   Not on file   Social Determinants of Health   Financial Resource Strain: Not on file  Food  Insecurity: Not on file  Transportation Needs: Not on file  Physical Activity: Not on file  Stress: Not on file  Social Connections: Not on file   SDOH:  SDOH Screenings   Alcohol Screen: Low Risk    Last Alcohol Screening Score (AUDIT): 7  Depression (PHQ2-9): Medium Risk   PHQ-2 Score: 26  Financial Resource Strain: Not on file  Food Insecurity: Not on file  Housing: Not on file  Physical Activity: Not on file  Social Connections: Not on file  Stress: Not on file  Tobacco Use: High Risk   Smoking Tobacco Use: Every Day   Smokeless Tobacco Use: Never  Transportation Needs: Not on file   Additional Social History:    Sleep: Good  Appetite:  Fair  Current Medications:  Current Facility-Administered Medications  Medication Dose Route Frequency Provider Last Rate Last Admin   acetaminophen (TYLENOL) tablet 650 mg  650 mg Oral Q6H PRN Estella Husk, MD       alum & mag hydroxide-simeth (MAALOX/MYLANTA) 200-200-20 MG/5ML suspension 30 mL  30 mL Oral Q4H PRN Estella Husk, MD       hydrOXYzine (ATARAX/VISTARIL) tablet 25 mg  25 mg Oral TID PRN Estella Husk, MD   25 mg at 02/13/21 2044   magnesium hydroxide (MILK OF MAGNESIA) suspension 30 mL  30 mL Oral Daily PRN Estella Husk, MD       traZODone (DESYREL) tablet 50 mg  50 mg Oral QHS PRN Estella Husk, MD   50 mg at 02/13/21 2044   venlafaxine XR (EFFEXOR-XR) 24 hr capsule 75 mg  75 mg Oral Q breakfast Lenard Lance, FNP   75 mg at 02/15/21 8413   Current Outpatient Medications  Medication Sig Dispense Refill   hydrOXYzine (ATARAX/VISTARIL) 25 MG tablet Take 1 tablet (25 mg total) by mouth 3 (three) times daily as needed for anxiety. 30 tablet 0   venlafaxine XR (EFFEXOR-XR) 37.5 MG 24 hr capsule Take 1 capsule (37.5 mg total) by mouth daily. 30 capsule 0    Labs  Lab Results:  Admission on 02/12/2021  Component Date Value Ref Range Status   POC Amphetamine UR 02/12/2021 None Detected   NONE DETECTED (Cut Off Level 1000 ng/mL) Final   POC Secobarbital (BAR) 02/12/2021 None Detected  NONE DETECTED (Cut Off Level 300 ng/mL) Final   POC Buprenorphine (BUP) 02/12/2021 None Detected  NONE DETECTED (Cut Off Level 10 ng/mL) Final   POC Oxazepam (BZO) 02/12/2021 None Detected  NONE DETECTED (Cut Off Level 300 ng/mL) Final   POC Cocaine UR 02/12/2021 None Detected  NONE DETECTED (Cut Off Level 300 ng/mL) Final   POC Methamphetamine UR 02/12/2021 None Detected  NONE DETECTED (Cut Off Level 1000 ng/mL) Final   POC Morphine 02/12/2021 Positive (A) NONE DETECTED (Cut Off Level 300 ng/mL) Final  POC Oxycodone UR 02/12/2021 None Detected  NONE DETECTED (Cut Off Level 100 ng/mL) Final   POC Methadone UR 02/12/2021 None Detected  NONE DETECTED (Cut Off Level 300 ng/mL) Final   POC Marijuana UR 02/12/2021 Positive (A) NONE DETECTED (Cut Off Level 50 ng/mL) Final  Admission on 02/12/2021, Discharged on 02/12/2021  Component Date Value Ref Range Status   SARS Coronavirus 2 by RT PCR 02/12/2021 NEGATIVE  NEGATIVE Final   Comment: (NOTE) SARS-CoV-2 target nucleic acids are NOT DETECTED.  The SARS-CoV-2 RNA is generally detectable in upper respiratory specimens during the acute phase of infection. The lowest concentration of SARS-CoV-2 viral copies this assay can detect is 138 copies/mL. A negative result does not preclude SARS-Cov-2 infection and should not be used as the sole basis for treatment or other patient management decisions. A negative result may occur with  improper specimen collection/handling, submission of specimen other than nasopharyngeal swab, presence of viral mutation(s) within the areas targeted by this assay, and inadequate number of viral copies(<138 copies/mL). A negative result must be combined with clinical observations, patient history, and epidemiological information. The expected result is Negative.  Fact Sheet for Patients:   BloggerCourse.com  Fact Sheet for Healthcare Providers:  SeriousBroker.it  This test is no                          t yet approved or cleared by the [place] FDA and  has been authorized for detection and/or diagnosis of SARS-CoV-2 by FDA under an Emergency Use Authorization (EUA). This EUA will remain  in effect (meaning this test can be used) for the duration of the COVID-19 declaration under Section 564(b)(1) of the Act, 21 U.S.C.section 360bbb-3(b)(1), unless the authorization is terminated  or revoked sooner.       Influenza A by PCR 02/12/2021 NEGATIVE  NEGATIVE Final   Influenza B by PCR 02/12/2021 NEGATIVE  NEGATIVE Final   Comment: (NOTE) The Xpert Xpress SARS-CoV-2/FLU/RSV plus assay is intended as an aid in the diagnosis of influenza from Nasopharyngeal swab specimens and should not be used as a sole basis for treatment. Nasal washings and aspirates are unacceptable for Xpert Xpress SARS-CoV-2/FLU/RSV testing.  Fact Sheet for Patients: BloggerCourse.com  Fact Sheet for Healthcare Providers: SeriousBroker.it  This test is not yet approved or cleared by the [place] FDA and has been authorized for detection and/or diagnosis of SARS-CoV-2 by FDA under an Emergency Use Authorization (EUA). This EUA will remain in effect (meaning this test can be used) for the duration of the COVID-19 declaration under Section 564(b)(1) of the Act, 21 U.S.C. section 360bbb-3(b)(1), unless the authorization is terminated or revoked.  Performed at [facility], [address], [place], [place] [address]    Sodium 02/12/2021 135  135 - 145 mmol/L Final   Potassium 02/12/2021 4.1  3.5 - 5.1 mmol/L Final   Chloride 02/12/2021 102  98 - 111 mmol/L Final   CO2 02/12/2021 28  22 - 32 mmol/L Final   Glucose, Bld 02/12/2021 92  70 - 99 mg/dL Final   Glucose  reference range applies only to samples taken after fasting for at least 8 hours.   BUN 02/12/2021 13  6 - 20 mg/dL Final   Creatinine, Ser 02/12/2021 0.56 (A) 0.61 - 1.24 mg/dL Final   Calcium 19/14/7829 8.9  8.9 - 10.3 mg/dL Final   Total Protein 56/21/3086 6.7  6.5 - 8.1 g/dL Final   Albumin 57/84/6962  3.9  3.5 - 5.0 g/dL Final   AST 16/04/9603 19  15 - 41 U/L Final   ALT 02/12/2021 15  0 - 44 U/L Final   Alkaline Phosphatase 02/12/2021 75  38 - 126 U/L Final   Total Bilirubin 02/12/2021 0.6  0.3 - 1.2 mg/dL Final   GFR, Estimated 02/12/2021 >60  >60 mL/min Final   Comment: (NOTE) Calculated using the CKD-EPI Creatinine Equation (2021)    Anion gap 02/12/2021 5  5 - 15 Final   Performed at [facility], [address], [place], [place] [address]   Alcohol, Ethyl (B) 02/12/2021 <10  <10 mg/dL Final   Comment: (NOTE) Lowest detectable limit for serum alcohol is 10 mg/dL.  For medical purposes only. Performed at [facility], [address] [place], [place] [address]    WBC 02/12/2021 7.1  4.0 - 10.5 K/uL Final   RBC 02/12/2021 4.68  4.22 - 5.81 MIL/uL Final   Hemoglobin 02/12/2021 13.4  13.0 - 17.0 g/dL Final   HCT 78/29/5621 40.7  39.0 - 52.0 % Final   MCV 02/12/2021 87.0  80.0 - 100.0 fL Final   MCH 02/12/2021 28.6  26.0 - 34.0 pg Final   MCHC 02/12/2021 32.9  30.0 - 36.0 g/dL Final   RDW 30/86/5784 13.0  11.5 - 15.5 % Final   Platelets 02/12/2021 263  150 - 400 K/uL Final   nRBC 02/12/2021 0.0  0.0 - 0.2 % Final   Neutrophils Relative % 02/12/2021 73  % Final   Neutro Abs 02/12/2021 5.2  1.7 - 7.7 K/uL Final   Lymphocytes Relative 02/12/2021 18  % Final   Lymphs Abs 02/12/2021 1.3  0.7 - 4.0 K/uL Final   Monocytes Relative 02/12/2021 5  % Final   Monocytes Absolute 02/12/2021 0.4  0.1 - 1.0 K/uL Final   Eosinophils Relative 02/12/2021 3  % Final   Eosinophils Absolute 02/12/2021 0.2  0.0 - 0.5 K/uL Final   Basophils Relative  02/12/2021 1  % Final   Basophils Absolute 02/12/2021 0.1  0.0 - 0.1 K/uL Final   Immature Granulocytes 02/12/2021 0  % Final   Abs Immature Granulocytes 02/12/2021 0.03  0.00 - 0.07 K/uL Final   Performed at [facility], [address] [place], [place] [address]   Salicylate Lvl 02/12/2021 <7.0 (A) 7.0 - 30.0 mg/dL Final   Performed at [facility], [address] [place], [place] [address]   Acetaminophen (Tylenol), Serum 02/12/2021 <10 (A) 10 - 30 ug/mL Final   Comment: (NOTE) Therapeutic concentrations vary significantly. A range of 10-30 ug/mL  may be an effective concentration for many patients. However, some  are best treated at concentrations outside of this range. Acetaminophen concentrations >150 ug/mL at 4 hours after ingestion  and >50 ug/mL at 12 hours after ingestion are often associated with  toxic reactions.  Performed at [facility], [address] [place], [place] [address]   Admission on 02/08/2021, Discharged on 02/10/2021  Component Date Value Ref Range Status   SARS Coronavirus 2 by RT PCR 02/08/2021 NEGATIVE  NEGATIVE Final   Comment: (NOTE) SARS-CoV-2 target nucleic acids are NOT DETECTED.  The SARS-CoV-2 RNA is generally detectable in upper respiratory specimens during the acute phase of infection. The lowest concentration of SARS-CoV-2 viral copies this assay can detect is 138 copies/mL. A negative result does not preclude SARS-Cov-2 infection and should not be used as the sole basis for treatment  or other patient management decisions. A negative result may occur with  improper specimen collection/handling, submission of specimen other than nasopharyngeal swab, presence of viral mutation(s) within the areas targeted by this assay, and inadequate number of viral copies(<138 copies/mL). A negative result must be combined with clinical observations, patient history, and  epidemiological information. The expected result is Negative.  Fact Sheet for Patients:  BloggerCourse.com  Fact Sheet for Healthcare Providers:  SeriousBroker.it  This test is no                          t yet approved or cleared by the [place] FDA and  has been authorized for detection and/or diagnosis of SARS-CoV-2 by FDA under an Emergency Use Authorization (EUA). This EUA will remain  in effect (meaning this test can be used) for the duration of the COVID-19 declaration under Section 564(b)(1) of the Act, 21 U.S.C.section 360bbb-3(b)(1), unless the authorization is terminated  or revoked sooner.       Influenza A by PCR 02/08/2021 NEGATIVE  NEGATIVE Final   Influenza B by PCR 02/08/2021 NEGATIVE  NEGATIVE Final   Comment: (NOTE) The Xpert Xpress SARS-CoV-2/FLU/RSV plus assay is intended as an aid in the diagnosis of influenza from Nasopharyngeal swab specimens and should not be used as a sole basis for treatment. Nasal washings and aspirates are unacceptable for Xpert Xpress SARS-CoV-2/FLU/RSV testing.  Fact Sheet for Patients: BloggerCourse.com  Fact Sheet for Healthcare Providers: SeriousBroker.it  This test is not yet approved or cleared by the [place] FDA and has been authorized for detection and/or diagnosis of SARS-CoV-2 by FDA under an Emergency Use Authorization (EUA). This EUA will remain in effect (meaning this test can be used) for the duration of the COVID-19 declaration under Section 564(b)(1) of the Act, 21 U.S.C. section 360bbb-3(b)(1), unless the authorization is terminated or revoked.  Performed at [facility], [address] [place], [place] [address]    WBC 02/08/2021 6.5  4.0 - 10.5 K/uL Final   RBC 02/08/2021 5.15  4.22 - 5.81 MIL/uL Final   Hemoglobin 02/08/2021 14.9  13.0 - 17.0 g/dL Final   HCT 60/45/4098 44.9  39.0 - 52.0  % Final   MCV 02/08/2021 87.2  80.0 - 100.0 fL Final   MCH 02/08/2021 28.9  26.0 - 34.0 pg Final   MCHC 02/08/2021 33.2  30.0 - 36.0 g/dL Final   RDW 11/91/4782 13.0  11.5 - 15.5 % Final   Platelets 02/08/2021 330  150 - 400 K/uL Final   nRBC 02/08/2021 0.0  0.0 - 0.2 % Final   Neutrophils Relative % 02/08/2021 69  % Final   Neutro Abs 02/08/2021 4.5  1.7 - 7.7 K/uL Final   Lymphocytes Relative 02/08/2021 20  % Final   Lymphs Abs 02/08/2021 1.3  0.7 - 4.0 K/uL Final   Monocytes Relative 02/08/2021 7  % Final   Monocytes Absolute 02/08/2021 0.4  0.1 - 1.0 K/uL Final   Eosinophils Relative 02/08/2021 3  % Final   Eosinophils Absolute 02/08/2021 0.2  0.0 - 0.5 K/uL Final   Basophils Relative 02/08/2021 0  % Final   Basophils Absolute 02/08/2021 0.0  0.0 - 0.1 K/uL Final   Immature Granulocytes 02/08/2021 1  % Final   Abs Immature Granulocytes 02/08/2021 0.05  0.00 - 0.07 K/uL Final   Performed at [facility], [address], [place], [place] [address]   Sodium 02/08/2021 137  135 - 145 mmol/L Final   Potassium 02/08/2021 4.4  3.5 - 5.1 mmol/L Final   Chloride 02/08/2021 101  98 - 111 mmol/L Final   CO2 02/08/2021 26  22 - 32 mmol/L Final   Glucose, Bld 02/08/2021 75  70 - 99 mg/dL Final   Glucose reference range applies only to samples taken after fasting for at least 8 hours.   BUN 02/08/2021 12  6 - 20 mg/dL Final   Creatinine, Ser 02/08/2021 0.83  0.61 - 1.24 mg/dL Final   Calcium 96/10/5407 9.9  8.9 - 10.3 mg/dL Final   Total Protein 81/19/1478 6.9  6.5 - 8.1 g/dL Final   Albumin 29/56/2130 4.0  3.5 - 5.0 g/dL Final   AST 86/57/8469 19  15 - 41 U/L Final   ALT 02/08/2021 19  0 - 44 U/L Final   Alkaline Phosphatase 02/08/2021 87  38 - 126 U/L Final   Total Bilirubin 02/08/2021 0.4  0.3 - 1.2 mg/dL Final   GFR, Estimated 02/08/2021 >60  >60 mL/min Final   Comment: (NOTE) Calculated using the CKD-EPI Creatinine Equation (2021)    Anion gap 02/08/2021 10  5 - 15 Final    Performed at [facility], [address] [place], [place] [address]   Alcohol, Ethyl (B) 02/08/2021 <10  <10 mg/dL Final   Comment: (NOTE) Lowest detectable limit for serum alcohol is 10 mg/dL.  For medical purposes only. Performed at [facility], [address] [place], [place] [address]    POC Amphetamine UR 02/08/2021 None Detected  NONE DETECTED (Cut Off Level 1000 ng/mL) Final   POC Secobarbital (BAR) 02/08/2021 None Detected  NONE DETECTED (Cut Off Level 300 ng/mL) Final   POC Buprenorphine (BUP) 02/08/2021 None Detected  NONE DETECTED (Cut Off Level 10 ng/mL) Final   POC Oxazepam (BZO) 02/08/2021 None Detected  NONE DETECTED (Cut Off Level 300 ng/mL) Final   POC Cocaine UR 02/08/2021 None Detected  NONE DETECTED (Cut Off Level 300 ng/mL) Final   POC Methamphetamine UR 02/08/2021 None Detected  NONE DETECTED (Cut Off Level 1000 ng/mL) Final   POC Morphine 02/08/2021 None Detected  NONE DETECTED (Cut Off Level 300 ng/mL) Final   POC Oxycodone UR 02/08/2021 None Detected  NONE DETECTED (Cut Off Level 100 ng/mL) Final   POC Methadone UR 02/08/2021 None Detected  NONE DETECTED (Cut Off Level 300 ng/mL) Final   POC Marijuana UR 02/08/2021 None Detected  NONE DETECTED (Cut Off Level 50 ng/mL) Final   SARSCOV2ONAVIRUS 2 AG 02/08/2021 NEGATIVE  NEGATIVE Final   Comment: (NOTE) SARS-CoV-2 antigen NOT DETECTED.   Negative results are presumptive.  Negative results do not preclude SARS-CoV-2 infection and should not be used as the sole basis for treatment or other patient management decisions, including infection  control decisions, particularly in the presence of clinical signs and  symptoms consistent with COVID-19, or in those who have been in contact with the virus.  Negative results must be combined with clinical observations, patient history, and epidemiological information. The expected result is Negative.  Fact Sheet for Patients:  https://www.jennings-kim.com/  Fact Sheet for Healthcare Providers: https://alexander-rogers.biz/  This test is not yet approved or cleared by the [place] FDA and  has been authorized for detection and/or diagnosis of SARS-CoV-2 by FDA under an Emergency Use Authorization (EUA).  This EUA will remain in effect (meaning this test can be used) for the duration of  the COV  ID-19 declaration under Section 564(b)(1) of the Act, 21 U.S.C. section 360bbb-3(b)(1), unless the authorization is terminated or revoked sooner.     SARS Coronavirus 2 Ag 02/08/2021 Negative  Negative Final   SARSCOV2ONAVIRUS 2 AG 02/08/2021 NEGATIVE  NEGATIVE Final   Comment: (NOTE) SARS-CoV-2 antigen NOT DETECTED.   Negative results are presumptive.  Negative results do not preclude SARS-CoV-2 infection and should not be used as the sole basis for treatment or other patient management decisions, including infection  control decisions, particularly in the presence of clinical signs and  symptoms consistent with COVID-19, or in those who have been in contact with the virus.  Negative results must be combined with clinical observations, patient history, and epidemiological information. The expected result is Negative.  Fact Sheet for Patients: https://www.jennings-kim.com/  Fact Sheet for Healthcare Providers: https://alexander-rogers.biz/  This test is not yet approved or cleared by the [place] FDA and  has been authorized for detection and/or diagnosis of SARS-CoV-2 by FDA under an Emergency Use Authorization (EUA).  This EUA will remain in effect (meaning this test can be used) for the duration of  the COV                          ID-19 declaration under Section 564(b)(1) of the Act, 21 U.S.C. section 360bbb-3(b)(1), unless the authorization is terminated or revoked sooner.    Admission on 11/30/2020, Discharged on  12/04/2020  Component Date Value Ref Range Status   Hepatitis B Surface Ag 12/01/2020 NON REACTIVE  NON REACTIVE Final   HCV Ab 12/01/2020 Reactive (A) NON REACTIVE Final   Comment: (NOTE) The CDC recommends that a Reactive HCV antibody result be followed up  with a HCV Nucleic Acid Amplification test.     Hep A IgM 12/01/2020 See Scanned report in [place] Link (A) NON REACTIVE Final   Performed at [facility]   Hep B C IgM 12/01/2020 NON REACTIVE  NON REACTIVE Final   Performed at [facility], [address], [place], [place] [address]   Sodium 12/01/2020 137  135 - 145 mmol/L Final   Potassium 12/01/2020 4.8  3.5 - 5.1 mmol/L Final   Chloride 12/01/2020 102  98 - 111 mmol/L Final   CO2 12/01/2020 25  22 - 32 mmol/L Final   Glucose, Bld 12/01/2020 132 (A) 70 - 99 mg/dL Final   Glucose reference range applies only to samples taken after fasting for at least 8 hours.   BUN 12/01/2020 15  6 - 20 mg/dL Final   Creatinine, Ser 12/01/2020 0.80  0.61 - 1.24 mg/dL Final   Calcium 87/68/1157 9.7  8.9 - 10.3 mg/dL Final   Total Protein 26/20/3559 7.9  6.5 - 8.1 g/dL Final   Albumin 74/16/3845 4.5  3.5 - 5.0 g/dL Final   AST 36/46/8032 59 (A) 15 - 41 U/L Final   ALT 12/01/2020 93 (A) 0 - 44 U/L Final   Alkaline Phosphatase 12/01/2020 77  38 - 126 U/L Final   Total Bilirubin 12/01/2020 0.6  0.3 - 1.2 mg/dL Final   GFR, Estimated 12/01/2020 >60  >60 mL/min Final   Comment: (NOTE) Calculated using the CKD-EPI Creatinine Equation (2021)    Anion gap 12/01/2020 10  5 - 15 Final   Performed at [facility], [address], [place], [place] [address]   TSH 12/01/2020 0.276 (A) 0.350 - 4.500 uIU/mL Final   Comment: Performed by a 3rd Generation  assay with a functional sensitivity of <=0.01 uIU/mL. Performed at [facility], [address] [place], [place] [address]    Free T4 12/03/2020 0.76  0.61 - 1.12 ng/dL Final   Comment:  (NOTE) Biotin ingestion may interfere with free T4 tests. If the results are inconsistent with the TSH level, previous test results, or the clinical presentation, then consider biotin interference. If needed, order repeat testing after stopping biotin. Performed at [facility], [address] [place], [place] [address]    T3, Free 12/03/2020 2.5  2.0 - 4.4 pg/mL Final   Comment: (NOTE) Performed At: [facility] [address] [place], [place] [id number] Jolene Schimke MD [id number]    Cholesterol 12/03/2020 174  0 - 200 mg/dL Final   Triglycerides 85/88/5027 84  <150 mg/dL Final   HDL 74/06/8785 61  >40 mg/dL Final   Total CHOL/HDL Ratio 12/03/2020 2.9  RATIO Final   VLDL 12/03/2020 17  0 - 40 mg/dL Final   LDL Cholesterol 12/03/2020 96  0 - 99 mg/dL Final   Comment:        Total Cholesterol/HDL:CHD Risk Coronary Heart Disease Risk Table                     Men   Women  1/2 Average Risk   3.4   3.3  Average Risk       5.0   4.4  2 X Average Risk   9.6   7.1  3 X Average Risk  23.4   11.0        Use the calculated Patient Ratio above and the CHD Risk Table to determine the patient's CHD Risk.        ATP III CLASSIFICATION (LDL):  <100     mg/dL   Optimal  767-209  mg/dL   Near or Above                    Optimal  130-159  mg/dL   Borderline  470-962  mg/dL   High  >836     mg/dL   Very High Performed at [facility], [address], [place], [place] [address]    HCV Quantitative 12/03/2020 68,900  >50 IU/mL Final   HCV Quantitative Log 12/03/2020 4.838  >1.70 log10 IU/mL Final   Test Information 12/03/2020 Comment   Final   Comment: (NOTE) The quantitative range of this assay is 15 IU/mL to 100 million IU/mL. Performed At: [facility] [address] [place], [place] [id number] Jolene Schimke MD SF:[id number]    Total Protein 12/03/2020 7.1  6.5 - 8.1 g/dL Final   Albumin 17/49/4496 4.3  3.5 - 5.0 g/dL Final   AST  75/91/6384 49 (A) 15 - 41 U/L Final   ALT 12/03/2020 89 (A) 0 - 44 U/L Final   Alkaline Phosphatase 12/03/2020 68  38 - 126 U/L Final   Total Bilirubin 12/03/2020 0.5  0.3 - 1.2 mg/dL Final   Bilirubin, Direct 12/03/2020 <0.1  0.0 - 0.2 mg/dL Final   Indirect Bilirubin 12/03/2020 NOT CALCULATED  0.3 - 0.9 mg/dL Final   Performed at [facility], [address], [place], [place] [address]   Hgb A1c MFr Bld 12/03/2020 5.3  4.8 - 5.6 % Final   Comment: (NOTE)         Prediabetes: 5.7 - 6.4         Diabetes: >6.4  Glycemic control for adults with diabetes: <7.0    Mean Plasma Glucose 12/03/2020 105  mg/dL Final   Comment: (NOTE) Performed At: [facility] [address] [place], [place] [id number] Jolene SchimkeNagendra Sanjai MD WU:[id number]:[phone]    Hepatitis C Quantitation 12/01/2020 27,600  IU/mL Final   HCV log10 12/01/2020 4.441  log10 IU/mL Final   Test Information (HCV): 12/01/2020 Comment   Final   Comment: (NOTE) The quantitative range of this assay is 15 IU/mL to 100 million IU/mL.    Interpretation (HCV): 12/01/2020 Comment   Final   Comment: (NOTE) Positive HCV antibody screen with the presence of HCV RNA is consistent with active infection. Performed At: [facility] [address] [place], [place] [id number] Jolene SchimkeNagendra Sanjai MD VH:[id number]:[phone]   Admission on 11/30/2020, Discharged on 11/30/2020  Component Date Value Ref Range Status   Sodium 11/30/2020 133 (A) 135 - 145 mmol/L Final   Potassium 11/30/2020 3.6  3.5 - 5.1 mmol/L Final   Chloride 11/30/2020 100  98 - 111 mmol/L Final   CO2 11/30/2020 24  22 - 32 mmol/L Final   Glucose, Bld 11/30/2020 106 (A) 70 - 99 mg/dL Final   Glucose reference range applies only to samples taken after fasting for at least 8 hours.   BUN 11/30/2020 13  6 - 20 mg/dL Final   Creatinine, Ser 11/30/2020 0.94  0.61 - 1.24 mg/dL Final   Calcium 41/32/440105/28/2022 9.4  8.9 - 10.3 mg/dL Final   Total Protein 02/72/536605/28/2022 6.4  (A) 6.5 - 8.1 g/dL Final   Albumin 44/03/474205/28/2022 3.9  3.5 - 5.0 g/dL Final   AST 59/56/387505/28/2022 34  15 - 41 U/L Final   ALT 11/30/2020 58 (A) 0 - 44 U/L Final   Alkaline Phosphatase 11/30/2020 64  38 - 126 U/L Final   Total Bilirubin 11/30/2020 0.5  0.3 - 1.2 mg/dL Final   GFR, Estimated 11/30/2020 >60  >60 mL/min Final   Comment: (NOTE) Calculated using the CKD-EPI Creatinine Equation (2021)    Anion gap 11/30/2020 9  5 - 15 Final   Performed at [facility], [address] [place], [place] [address]   Alcohol, Ethyl (B) 11/30/2020 <10  <10 mg/dL Final   Comment: (NOTE) Lowest detectable limit for serum alcohol is 10 mg/dL.  For medical purposes only. Performed at [facility], [address] [place], [place] [address]    Salicylate Lvl 11/30/2020 <7.0 (A) 7.0 - 30.0 mg/dL Final   Performed at [facility], [address] [place], [place] [address]   Acetaminophen (Tylenol), Serum 11/30/2020 <10 (A) 10 - 30 ug/mL Final   Comment: (NOTE) Therapeutic concentrations vary significantly. A range of 10-30 ug/mL  may be an effective concentration for many patients. However, some  are best treated at concentrations outside of this range. Acetaminophen concentrations >150 ug/mL at 4 hours after ingestion  and >50 ug/mL at 12 hours after ingestion are often associated with  toxic reactions.  Performed at [facility], [address] [place], [place] [address]    WBC 11/30/2020 8.6  4.0 - 10.5 K/uL Final   RBC 11/30/2020 4.26  4.22 - 5.81 MIL/uL Final   Hemoglobin 11/30/2020 12.5 (A) 13.0 - 17.0 g/dL Final   HCT 01/09/323505/28/2022 37.7 (A) 39.0 - 52.0 % Final   MCV 11/30/2020 88.5  80.0 - 100.0 fL Final   MCH 11/30/2020 29.3  26.0 - 34.0 pg Final   MCHC 11/30/2020 33.2  30.0 -  36.0 g/dL Final   RDW 16/04/9603 13.1  11.5 - 15.5 % Final   Platelets 11/30/2020 220  150 - 400 K/uL Final   nRBC 11/30/2020 0.0  0.0 - 0.2 % Final   Performed at [facility], [address], [place], [place] [address]   Opiates 11/30/2020 POSITIVE (A) NONE DETECTED Final   Cocaine 11/30/2020 NONE DETECTED  NONE DETECTED Final   Benzodiazepines 11/30/2020 NONE DETECTED  NONE DETECTED Final   Amphetamines 11/30/2020 NONE DETECTED  NONE DETECTED Final   Tetrahydrocannabinol 11/30/2020 NONE DETECTED  NONE DETECTED Final   Barbiturates 11/30/2020 NONE DETECTED  NONE DETECTED Final   Comment: (NOTE) DRUG SCREEN FOR MEDICAL PURPOSES ONLY.  IF CONFIRMATION IS NEEDED FOR ANY PURPOSE, NOTIFY LAB WITHIN 5 DAYS.  LOWEST DETECTABLE LIMITS FOR URINE DRUG SCREEN Drug Class                     Cutoff (ng/mL) Amphetamine and metabolites    1000 Barbiturate and metabolites    200 Benzodiazepine                 200 Tricyclics and metabolites     300 Opiates and metabolites        300 Cocaine and metabolites        300 THC                            50 Performed at [facility], [address], [place], [place] [address]    SARS Coronavirus 2 by RT PCR 11/30/2020 NEGATIVE  NEGATIVE Final   Comment: (NOTE) SARS-CoV-2 target nucleic acids are NOT DETECTED.  The SARS-CoV-2 RNA is generally detectable in upper respiratory specimens during the acute phase of infection. The lowest concentration of SARS-CoV-2 viral copies this assay can detect is 138 copies/mL. A negative result does not preclude SARS-Cov-2 infection and should not be used as the sole basis for treatment or other patient management decisions. A negative result may occur with  improper specimen collection/handling, submission of specimen other than nasopharyngeal swab, presence of viral mutation(s) within the areas targeted by this assay, and inadequate number of viral copies(<138 copies/mL). A negative result must be combined with clinical observations, patient history, and epidemiological information. The expected result is Negative.  Fact Sheet for Patients:  BloggerCourse.com  Fact  Sheet for Healthcare Providers:  SeriousBroker.it  This test is no                          t yet approved or cleared by the [place] FDA and  has been authorized for detection and/or diagnosis of SARS-CoV-2 by FDA under an Emergency Use Authorization (EUA). This EUA will remain  in effect (meaning this test can be used) for the duration of the COVID-19 declaration under Section 564(b)(1) of the Act, 21 U.S.C.section 360bbb-3(b)(1), unless the authorization is terminated  or revoked sooner.       Influenza A by PCR 11/30/2020 NEGATIVE  NEGATIVE Final   Influenza B by PCR 11/30/2020 NEGATIVE  NEGATIVE Final   Comment: (NOTE) The Xpert Xpress SARS-CoV-2/FLU/RSV plus assay is intended as an aid in the diagnosis of influenza from Nasopharyngeal swab specimens and should not be used as a sole basis for treatment. Nasal washings and aspirates are unacceptable for Xpert Xpress SARS-CoV-2/FLU/RSV testing.  Fact Sheet for Patients: BloggerCourse.com  Fact Sheet for Healthcare  Providers: SeriousBroker.it  This test is not yet approved or cleared by the [place] and has been authorized for detection and/or diagnosis of SARS-CoV-2 by FDA under an Emergency Use Authorization (EUA). This EUA will remain in effect (meaning this test can be used) for the duration of the COVID-19 declaration under Section 564(b)(1) of the Act, 21 U.S.C. section 360bbb-3(b)(1), unless the authorization is terminated or revoked.  Performed at [facility], [address], [place], [place] [address]     Blood Alcohol level:  Lab Results  Component Value Date   ETH <10 02/12/2021   ETH <10 02/08/2021    Metabolic Disorder Labs: Lab Results  Component Value Date   HGBA1C 5.3 12/03/2020   MPG 105 12/03/2020   MPG 108.28 11/23/2019   No results found for: PROLACTIN Lab Results  Component Value Date   CHOL  174 12/03/2020   TRIG 84 12/03/2020   HDL 61 12/03/2020   CHOLHDL 2.9 12/03/2020   VLDL 17 12/03/2020   LDLCALC 96 12/03/2020   LDLCALC 116 (H) 11/23/2019    Therapeutic Lab Levels: No results found for: LITHIUM No results found for: VALPROATE No components found for:  CBMZ  Physical Findings   AIMS    Flowsheet Row [facility]  AIMS Total Score 0 0      AUDIT    Flowsheet Row [facility]) [facility]  Alcohol Use Disorder Identification Test Final Score (AUDIT) 7 8      PHQ2-9    Flowsheet Row ED from 11/30/2020 in [facility]PART[facility]  PHQ-2 Total Score 5  PHQ-9 Total Score 26      Flowsheet Row ED from 02/12/2021 in [facility] Most recent reading at 02/12/2021  3:51 PM ED from 02/12/2021 in [facility] Most recent reading at 02/12/2021  3:35 PM ED from 02/08/2021 in [facility] Most recent reading at 02/08/2021 10:01 AM  C-SSRS RISK CATEGORY High Risk High Risk Low Risk        Musculoskeletal  Strength & Muscle Tone: within normal limits Gait & Station: normal Patient leans: N/A  Psychiatric Specialty Exam  Presentation  General Appearance: Appropriate for Environment; Casual  Eye Contact:Good  Speech:Clear and Coherent; Normal Rate  Speech Volume:Normal  Handedness:Right   Mood and Affect  Mood:Euphoric  Affect:Appropriate; Congruent   Thought Process  Thought Processes:Coherent; Goal Directed  Descriptions of Associations:Intact  Orientation:Full (Time, Place and Person)  Thought Content:Logical  Diagnosis of  Schizophrenia or Schizoaffective disorder in past: No    Hallucinations:Hallucinations: None  Ideas of Reference:None  Suicidal Thoughts:Suicidal Thoughts: No  Homicidal Thoughts:Homicidal Thoughts: No   Sensorium  Memory:Immediate Good; Recent Good; Remote Good  Judgment:Good  Insight:Good   Executive Functions  Concentration:Good  Attention Span:Good  Recall:Good  Fund of Knowledge:Good  Language:Good   Psychomotor Activity  Psychomotor Activity:Psychomotor Activity: Normal   Assets  Assets:Communication Skills; Desire for Improvement; Financial Resources/Insurance; Physical Health; Leisure Time; Resilience; Social Support   Sleep  Sleep:Sleep: Fair Number of Hours of Sleep: 7   No data recorded  Physical Exam  Physical Exam Vitals and nursing note reviewed.  Constitutional:      Appearance: Normal appearance. He is well-developed.  HENT:     Head: Normocephalic and atraumatic.  Eyes:     General:        Right eye: No discharge.        Left eye: No discharge.     Conjunctiva/sclera: Conjunctivae normal.  Cardiovascular:     Rate and Rhythm: Normal rate and regular rhythm.  Pulmonary:     Effort: Pulmonary effort is normal. No respiratory distress.  Musculoskeletal:        General: Normal range of motion.     Cervical back: Normal range of motion.  Skin:    Coloration: Skin is not jaundiced or pale.  Neurological:     Mental Status: He is alert and oriented to person, place, and time.  Psychiatric:        Attention and Perception: Attention and perception normal.        Mood and Affect: Mood normal.        Speech: Speech normal.        Behavior: Behavior normal. Behavior is cooperative.        Thought Content: Thought content normal.        Cognition and Memory: Cognition normal.        Judgment: Judgment is impulsive.   Review of Systems  Constitutional: Negative.  Negative for chills and fever.  HENT: Negative.  Negative for hearing  loss.   Respiratory: Negative.  Negative for cough.   Cardiovascular: Negative.  Negative for chest pain.  Gastrointestinal: Negative.   Musculoskeletal: Negative.   Skin: Negative.   Neurological: Negative.   Psychiatric/Behavioral: Negative.    Blood pressure (!) 143/101, pulse 78, temperature 97.8 F (36.6 C), temperature source Tympanic, resp. rate 18, SpO2 99 %. There is no height or weight on file to calculate BMI.  Treatment Plan Summary:   Daily contact with patient to assess and evaluate symptoms and progress in treatment and Medication management  Continue current medications.  No medication changes at this time Labs and EKG reviewed. EKG results on 02/12/2021 QT/QTC 398/429   Neldon has an appointment for screening for admission at [facility] in [facility] on Tuesday, 02/18/2021  Ardis Hughs, NP 02/15/2021 9:54 AM

## 2021-02-15 NOTE — ED Notes (Signed)
Patient is resting quietly with eyes closed, no signs of distress. Respirations regular and unlabored.Will continue to monitor

## 2021-02-15 NOTE — Progress Notes (Signed)
Patient in dayroom watching television.

## 2021-02-15 NOTE — ED Notes (Signed)
Patient lying on bed.  Denied SI, HI, AVH.  Eating meals and attending to hygiene.  Self reported that he's attending groups.

## 2021-02-15 NOTE — Progress Notes (Signed)
Patient out to dining room for lunch. 

## 2021-02-15 NOTE — Progress Notes (Signed)
Given dinner

## 2021-02-15 NOTE — ED Notes (Signed)
Patient interactive in group tonight.  Patient denies SI, HI, AVH - will continue to assess for safety

## 2021-02-15 NOTE — ED Notes (Signed)
Patient in group at this time - will assess further after group done.  No sxs of distress noted. Will continue to monitor for safety

## 2021-02-15 NOTE — Group Therapy Note (Signed)
Health Support Systems Saturday - Healthy Support Systems & Self Advocacy  Date: 02/15/21  Type of Therapy/Therapeutic Modalities: Solution-Focused, Motivational Interviewing, Supportive   Participation Level: Active  Objective: To assist patients in learning effective communication skills to express their needs to respective support and regulatory systems appropriately. Facilitators will challenge patients to identify positive influences in their life to provide insight on healthy relationships that could provide support in the event of a crisis and recovery.   Therapeutic Goals:  Patient will discuss how they identify a healthy support system.  Patient will identify current and potential support people and/or systems in their life currently.  Patient will discuss setting healthy boundaries and conflict resolution skills to maintain support systems.  Patient will reflect on how they can strengthen their support system when you get out of the hospital.  Summary of Patient's Progress: Quintavious participated in group. He identified his mother and step mother as supports for him emotionally and financially. He explained that he often asks them for money to help pay for food. He disclosed that he has been manipulative towards them by sometimes using the food money to buy drugs instead.

## 2021-02-15 NOTE — Progress Notes (Signed)
Patient arrived late, but currently in group.

## 2021-02-15 NOTE — ED Notes (Signed)
BP elevated.  Eber Jones, NP notified.

## 2021-02-16 DIAGNOSIS — F332 Major depressive disorder, recurrent severe without psychotic features: Secondary | ICD-10-CM | POA: Diagnosis not present

## 2021-02-16 DIAGNOSIS — F17210 Nicotine dependence, cigarettes, uncomplicated: Secondary | ICD-10-CM | POA: Diagnosis not present

## 2021-02-16 DIAGNOSIS — F1994 Other psychoactive substance use, unspecified with psychoactive substance-induced mood disorder: Secondary | ICD-10-CM | POA: Diagnosis not present

## 2021-02-16 NOTE — Group Therapy Note (Signed)
Self Care Sunday - Self Care  Date: 02/16/21  Type of Therapy/Therapeutic Modalities: Motivational Interviewing, CBT, Supportive   Participation Level: Active  Objectives: Facilitators will discuss with patients their perspectives on self-care and how it is demonstrated in their current lives. Patients will explore new possible self-care practices. Patients will begin to brainstorm a plan of action on how they will implement self-care at discharge.   Therapeutic Goals:   1. Patient will identify areas in which they lack practicing self-care.  2. Patient will explore possible activities and actions that will serve as self-care practices. 3. Patient will discuss how they plan to implement healthy self-care practices into their daily regiments.   Summary of Patient's Progress: Pt very active in group session. Pt stated that he practices self-care by maintaining his personal hygiene, getting enough sleep, and maintaining healthy eating habits. Pt states that he would like to stop using drugs and this will help with his self-care journey because he will have a clear mind and a healthier lifestyle. Pt was given self-care tips and other material to assist him with his self-care journey and he verbalized understanding. Pt was engaged and expressed gratitude for today's group.

## 2021-02-16 NOTE — ED Notes (Signed)
Pt sitting in dining room watching TV. Pt A&O x4, calm and cooperative. Denies SI/HI/AVH. Pt requests PRN Vistaril. No signs of acute distress noted. Will continue to monitor for safety.

## 2021-02-16 NOTE — ED Provider Notes (Signed)
Behavioral Health Progress Note  Date and Time: 02/16/2021 5:10 PM Name: Jared Jordan MRN:  [id number]  Subjective:  Jared Jordan, 30 y.o., male patient seen face to face by this provider, chart reviewed and discussed with treatment team and Dr. [name] on 02/16/21.  On evaluation Jared Jordan report, "I am doing better".  Patient calm/cooperative, alert/oriented x 4, with pleasant affect, and does not appear to be responding to internal/external stimuli.  He makes good eye contact.  Speech is clear, coherent, normal rate and tone.  Denies any concerns with appetite, states it is improving.  Reports some difficulty with sleeping, not discussed as needed trazodone and hydroxyzine.Patient denied suicidal/homicidal/self-harm ideation, psychosis, and paranoia.  Discussed discharge planning. Patient's feelings and progress discussed.  States, "if I am discharged without getting into [facility] or wherever.  I will just end up right back in here".  States he left too early on his last admission and he does not want that to happen again.  States, "I am serious about getting clean this time".  Patient is adamant that he needs to stay in the [facility] until he is excepted into a treatment facility.  Reassurance, support, and encouragement provided.   Diagnosis:  Final diagnoses:  Major depressive disorder, recurrent severe without psychotic features (HCC)    Total Time spent with patient: 20 minutes  Past Psychiatric History: Major depressive disorder, recurrent severe, without psychosis, substance-induced mood disorder Past Medical History:  Past Medical History:  Diagnosis Date   Drug use     Past Surgical History:  Procedure Laterality Date   SHOULDER SURGERY     SHOULDER SURGERY Left 2020   Family History: History reviewed. No pertinent family history. Family Psychiatric  History: unknown Social History:  Social History   Substance and Sexual Activity  Alcohol Use Yes    Alcohol/week: 4.0 - 6.0 standard drinks   Types: 4 - 6 Cans of beer per week   Comment: 2-3 times weekly     Social History   Substance and Sexual Activity  Drug Use Not Currently   Types: IV, Methamphetamines   Comment: using 1gm everyday heroin    Social History   Socioeconomic History   Marital status: Single    Spouse name: Not on file   Number of children: Not on file   Years of education: Not on file   Highest education level: Not on file  Occupational History   Not on file  Tobacco Use   Smoking status: Every Day    Packs/day: 0.25    Years: 1.00    Pack years: 0.25    Types: Cigarettes   Smokeless tobacco: Never  Vaping Use   Vaping Use: Some days   Start date: 11/04/2018   Devices: Jewel Menthol  Substance and Sexual Activity   Alcohol use: Yes    Alcohol/week: 4.0 - 6.0 standard drinks    Types: 4 - 6 Cans of beer per week    Comment: 2-3 times weekly   Drug use: Not Currently    Types: IV, Methamphetamines    Comment: using 1gm everyday heroin   Sexual activity: Yes  Other Topics Concern   Not on file  Social History Narrative   Not on file   Social Determinants of Health   Financial Resource Strain: Not on file  Food Insecurity: Not on file  Transportation Needs: Not on file  Physical Activity: Not on file  Stress: Not on file  Social Connections: Not on file  SDOH:  SDOH Screenings   Alcohol Screen: Low Risk    Last Alcohol Screening Score (AUDIT): 7  Depression (PHQ2-9): Medium Risk   PHQ-2 Score: 26  Financial Resource Strain: Not on file  Food Insecurity: Not on file  Housing: Not on file  Physical Activity: Not on file  Social Connections: Not on file  Stress: Not on file  Tobacco Use: High Risk   Smoking Tobacco Use: Every Day   Smokeless Tobacco Use: Never  Transportation Needs: Not on file   Additional Social History:      Sleep: Fair  Appetite:  Good  Current Medications:  Current Facility-Administered Medications   Medication Dose Route Frequency Provider Last Rate Last Admin   acetaminophen (TYLENOL) tablet 650 mg  650 mg Oral Q6H PRN Estella Husk, MD       alum & mag hydroxide-simeth (MAALOX/MYLANTA) 200-200-20 MG/5ML suspension 30 mL  30 mL Oral Q4H PRN Estella Husk, MD       hydrOXYzine (ATARAX/VISTARIL) tablet 25 mg  25 mg Oral TID PRN Estella Husk, MD   25 mg at 02/13/21 2044   magnesium hydroxide (MILK OF MAGNESIA) suspension 30 mL  30 mL Oral Daily PRN Estella Husk, MD       traZODone (DESYREL) tablet 50 mg  50 mg Oral QHS PRN Estella Husk, MD   50 mg at 02/13/21 2044   venlafaxine XR (EFFEXOR-XR) 24 hr capsule 75 mg  75 mg Oral Q breakfast Lenard Lance, FNP   75 mg at 02/16/21 1015   Current Outpatient Medications  Medication Sig Dispense Refill   hydrOXYzine (ATARAX/VISTARIL) 25 MG tablet Take 1 tablet (25 mg total) by mouth 3 (three) times daily as needed for anxiety. 30 tablet 0   venlafaxine XR (EFFEXOR-XR) 37.5 MG 24 hr capsule Take 1 capsule (37.5 mg total) by mouth daily. 30 capsule 0    Labs  Lab Results:  Admission on 02/12/2021  Component Date Value Ref Range Status   POC Amphetamine UR 02/12/2021 None Detected  NONE DETECTED (Cut Off Level 1000 ng/mL) Final   POC Secobarbital (BAR) 02/12/2021 None Detected  NONE DETECTED (Cut Off Level 300 ng/mL) Final   POC Buprenorphine (BUP) 02/12/2021 None Detected  NONE DETECTED (Cut Off Level 10 ng/mL) Final   POC Oxazepam (BZO) 02/12/2021 None Detected  NONE DETECTED (Cut Off Level 300 ng/mL) Final   POC Cocaine UR 02/12/2021 None Detected  NONE DETECTED (Cut Off Level 300 ng/mL) Final   POC Methamphetamine UR 02/12/2021 None Detected  NONE DETECTED (Cut Off Level 1000 ng/mL) Final   POC Morphine 02/12/2021 Positive (A) NONE DETECTED (Cut Off Level 300 ng/mL) Final   POC Oxycodone UR 02/12/2021 None Detected  NONE DETECTED (Cut Off Level 100 ng/mL) Final   POC Methadone UR 02/12/2021 None Detected   NONE DETECTED (Cut Off Level 300 ng/mL) Final   POC Marijuana UR 02/12/2021 Positive (A) NONE DETECTED (Cut Off Level 50 ng/mL) Final  Admission on 02/12/2021, Discharged on 02/12/2021  Component Date Value Ref Range Status   SARS Coronavirus 2 by RT PCR 02/12/2021 NEGATIVE  NEGATIVE Final   Comment: (NOTE) SARS-CoV-2 target nucleic acids are NOT DETECTED.  The SARS-CoV-2 RNA is generally detectable in upper respiratory specimens during the acute phase of infection. The lowest concentration of SARS-CoV-2 viral copies this assay can detect is 138 copies/mL. A negative result does not preclude SARS-Cov-2 infection and should not be used as the sole basis for treatment  or other patient management decisions. A negative result may occur with  improper specimen collection/handling, submission of specimen other than nasopharyngeal swab, presence of viral mutation(s) within the areas targeted by this assay, and inadequate number of viral copies(<138 copies/mL). A negative result must be combined with clinical observations, patient history, and epidemiological information. The expected result is Negative.  Fact Sheet for Patients:  BloggerCourse.com  Fact Sheet for Healthcare Providers:  SeriousBroker.it  This test is no                          t yet approved or cleared by the [place] FDA and  has been authorized for detection and/or diagnosis of SARS-CoV-2 by FDA under an Emergency Use Authorization (EUA). This EUA will remain  in effect (meaning this test can be used) for the duration of the COVID-19 declaration under Section 564(b)(1) of the Act, 21 U.S.C.section 360bbb-3(b)(1), unless the authorization is terminated  or revoked sooner.       Influenza A by PCR 02/12/2021 NEGATIVE  NEGATIVE Final   Influenza B by PCR 02/12/2021 NEGATIVE  NEGATIVE Final   Comment: (NOTE) The Xpert Xpress SARS-CoV-2/FLU/RSV plus assay is  intended as an aid in the diagnosis of influenza from Nasopharyngeal swab specimens and should not be used as a sole basis for treatment. Nasal washings and aspirates are unacceptable for Xpert Xpress SARS-CoV-2/FLU/RSV testing.  Fact Sheet for Patients: BloggerCourse.com  Fact Sheet for Healthcare Providers: SeriousBroker.it  This test is not yet approved or cleared by the [place] FDA and has been authorized for detection and/or diagnosis of SARS-CoV-2 by FDA under an Emergency Use Authorization (EUA). This EUA will remain in effect (meaning this test can be used) for the duration of the COVID-19 declaration under Section 564(b)(1) of the Act, 21 U.S.C. section 360bbb-3(b)(1), unless the authorization is terminated or revoked.  Performed at [facility], [address], [place], [place] [address]    Sodium 02/12/2021 135  135 - 145 mmol/L Final   Potassium 02/12/2021 4.1  3.5 - 5.1 mmol/L Final   Chloride 02/12/2021 102  98 - 111 mmol/L Final   CO2 02/12/2021 28  22 - 32 mmol/L Final   Glucose, Bld 02/12/2021 92  70 - 99 mg/dL Final   Glucose reference range applies only to samples taken after fasting for at least 8 hours.   BUN 02/12/2021 13  6 - 20 mg/dL Final   Creatinine, Ser 02/12/2021 0.56 (A) 0.61 - 1.24 mg/dL Final   Calcium 00/93/8182 8.9  8.9 - 10.3 mg/dL Final   Total Protein 99/37/1696 6.7  6.5 - 8.1 g/dL Final   Albumin 78/93/8101 3.9  3.5 - 5.0 g/dL Final   AST 75/04/2584 19  15 - 41 U/L Final   ALT 02/12/2021 15  0 - 44 U/L Final   Alkaline Phosphatase 02/12/2021 75  38 - 126 U/L Final   Total Bilirubin 02/12/2021 0.6  0.3 - 1.2 mg/dL Final   GFR, Estimated 02/12/2021 >60  >60 mL/min Final   Comment: (NOTE) Calculated using the CKD-EPI Creatinine Equation (2021)    Anion gap 02/12/2021 5  5 - 15 Final   Performed at [facility], [address], [place], [place]  [address]   Alcohol, Ethyl (B) 02/12/2021 <10  <10 mg/dL Final   Comment: (NOTE) Lowest detectable limit for serum alcohol is 10 mg/dL.  For medical purposes only. Performed at [facility],  [address], [place], [place] [address]    WBC 02/12/2021 7.1  4.0 - 10.5 K/uL Final   RBC 02/12/2021 4.68  4.22 - 5.81 MIL/uL Final   Hemoglobin 02/12/2021 13.4  13.0 - 17.0 g/dL Final   HCT 19/14/7829 40.7  39.0 - 52.0 % Final   MCV 02/12/2021 87.0  80.0 - 100.0 fL Final   MCH 02/12/2021 28.6  26.0 - 34.0 pg Final   MCHC 02/12/2021 32.9  30.0 - 36.0 g/dL Final   RDW 56/21/3086 13.0  11.5 - 15.5 % Final   Platelets 02/12/2021 263  150 - 400 K/uL Final   nRBC 02/12/2021 0.0  0.0 - 0.2 % Final   Neutrophils Relative % 02/12/2021 73  % Final   Neutro Abs 02/12/2021 5.2  1.7 - 7.7 K/uL Final   Lymphocytes Relative 02/12/2021 18  % Final   Lymphs Abs 02/12/2021 1.3  0.7 - 4.0 K/uL Final   Monocytes Relative 02/12/2021 5  % Final   Monocytes Absolute 02/12/2021 0.4  0.1 - 1.0 K/uL Final   Eosinophils Relative 02/12/2021 3  % Final   Eosinophils Absolute 02/12/2021 0.2  0.0 - 0.5 K/uL Final   Basophils Relative 02/12/2021 1  % Final   Basophils Absolute 02/12/2021 0.1  0.0 - 0.1 K/uL Final   Immature Granulocytes 02/12/2021 0  % Final   Abs Immature Granulocytes 02/12/2021 0.03  0.00 - 0.07 K/uL Final   Performed at [facility], [address] [place], [place] [address]   Salicylate Lvl 02/12/2021 <7.0 (A) 7.0 - 30.0 mg/dL Final   Performed at [facility], [address], [place], [place] [address]   Acetaminophen (Tylenol), Serum 02/12/2021 <10 (A) 10 - 30 ug/mL Final   Comment: (NOTE) Therapeutic concentrations vary significantly. A range of 10-30 ug/mL  may be an effective concentration for many patients. However, some  are best treated at concentrations outside of this range. Acetaminophen concentrations >150 ug/mL at 4 hours after  ingestion  and >50 ug/mL at 12 hours after ingestion are often associated with  toxic reactions.  Performed at [facility], [address] [place], [place] [address]   Admission on 02/08/2021, Discharged on 02/10/2021  Component Date Value Ref Range Status   SARS Coronavirus 2 by RT PCR 02/08/2021 NEGATIVE  NEGATIVE Final   Comment: (NOTE) SARS-CoV-2 target nucleic acids are NOT DETECTED.  The SARS-CoV-2 RNA is generally detectable in upper respiratory specimens during the acute phase of infection. The lowest concentration of SARS-CoV-2 viral copies this assay can detect is 138 copies/mL. A negative result does not preclude SARS-Cov-2 infection and should not be used as the sole basis for treatment or other patient management decisions. A negative result may occur with  improper specimen collection/handling, submission of specimen other than nasopharyngeal swab, presence of viral mutation(s) within the areas targeted by this assay, and inadequate number of viral copies(<138 copies/mL). A negative result must be combined with clinical observations, patient history, and epidemiological information. The expected result is Negative.  Fact Sheet for Patients:  BloggerCourse.com  Fact Sheet for Healthcare Providers:  SeriousBroker.it  This test is no                          t yet approved or cleared by the [place] FDA and  has been authorized for detection and/or diagnosis of SARS-CoV-2 by FDA under an Emergency Use Authorization (EUA). This EUA will  remain  in effect (meaning this test can be used) for the duration of the COVID-19 declaration under Section 564(b)(1) of the Act, 21 U.S.C.section 360bbb-3(b)(1), unless the authorization is terminated  or revoked sooner.       Influenza A by PCR 02/08/2021 NEGATIVE  NEGATIVE Final   Influenza B by PCR 02/08/2021 NEGATIVE  NEGATIVE Final   Comment:  (NOTE) The Xpert Xpress SARS-CoV-2/FLU/RSV plus assay is intended as an aid in the diagnosis of influenza from Nasopharyngeal swab specimens and should not be used as a sole basis for treatment. Nasal washings and aspirates are unacceptable for Xpert Xpress SARS-CoV-2/FLU/RSV testing.  Fact Sheet for Patients: BloggerCourse.com  Fact Sheet for Healthcare Providers: SeriousBroker.it  This test is not yet approved or cleared by the [place] FDA and has been authorized for detection and/or diagnosis of SARS-CoV-2 by FDA under an Emergency Use Authorization (EUA). This EUA will remain in effect (meaning this test can be used) for the duration of the COVID-19 declaration under Section 564(b)(1) of the Act, 21 U.S.C. section 360bbb-3(b)(1), unless the authorization is terminated or revoked.  Performed at [facility], [address] [place], [place] [address]    WBC 02/08/2021 6.5  4.0 - 10.5 K/uL Final   RBC 02/08/2021 5.15  4.22 - 5.81 MIL/uL Final   Hemoglobin 02/08/2021 14.9  13.0 - 17.0 g/dL Final   HCT 40/98/1191 44.9  39.0 - 52.0 % Final   MCV 02/08/2021 87.2  80.0 - 100.0 fL Final   MCH 02/08/2021 28.9  26.0 - 34.0 pg Final   MCHC 02/08/2021 33.2  30.0 - 36.0 g/dL Final   RDW 47/82/9562 13.0  11.5 - 15.5 % Final   Platelets 02/08/2021 330  150 - 400 K/uL Final   nRBC 02/08/2021 0.0  0.0 - 0.2 % Final   Neutrophils Relative % 02/08/2021 69  % Final   Neutro Abs 02/08/2021 4.5  1.7 - 7.7 K/uL Final   Lymphocytes Relative 02/08/2021 20  % Final   Lymphs Abs 02/08/2021 1.3  0.7 - 4.0 K/uL Final   Monocytes Relative 02/08/2021 7  % Final   Monocytes Absolute 02/08/2021 0.4  0.1 - 1.0 K/uL Final   Eosinophils Relative 02/08/2021 3  % Final   Eosinophils Absolute 02/08/2021 0.2  0.0 - 0.5 K/uL Final   Basophils Relative 02/08/2021 0  % Final   Basophils Absolute 02/08/2021 0.0  0.0 - 0.1 K/uL Final   Immature  Granulocytes 02/08/2021 1  % Final   Abs Immature Granulocytes 02/08/2021 0.05  0.00 - 0.07 K/uL Final   Performed at [facility], [address] [place], [place] [address]   Sodium 02/08/2021 137  135 - 145 mmol/L Final   Potassium 02/08/2021 4.4  3.5 - 5.1 mmol/L Final   Chloride 02/08/2021 101  98 - 111 mmol/L Final   CO2 02/08/2021 26  22 - 32 mmol/L Final   Glucose, Bld 02/08/2021 75  70 - 99 mg/dL Final   Glucose reference range applies only to samples taken after fasting for at least 8 hours.   BUN 02/08/2021 12  6 - 20 mg/dL Final   Creatinine, Ser 02/08/2021 0.83  0.61 - 1.24 mg/dL Final   Calcium 57/84/6962 9.9  8.9 - 10.3 mg/dL Final   Total Protein 95/28/4132 6.9  6.5 - 8.1 g/dL Final   Albumin 44/07/270 4.0  3.5 - 5.0 g/dL Final   AST 53/66/4403 19  15 - 41 U/L Final  ALT 02/08/2021 19  0 - 44 U/L Final   Alkaline Phosphatase 02/08/2021 87  38 - 126 U/L Final   Total Bilirubin 02/08/2021 0.4  0.3 - 1.2 mg/dL Final   GFR, Estimated 02/08/2021 >60  >60 mL/min Final   Comment: (NOTE) Calculated using the CKD-EPI Creatinine Equation (2021)    Anion gap 02/08/2021 10  5 - 15 Final   Performed at [facility], [address] [place], [place] [address]   Alcohol, Ethyl (B) 02/08/2021 <10  <10 mg/dL Final   Comment: (NOTE) Lowest detectable limit for serum alcohol is 10 mg/dL.  For medical purposes only. Performed at [facility], [address], [place], [place] [address]    POC Amphetamine UR 02/08/2021 None Detected  NONE DETECTED (Cut Off Level 1000 ng/mL) Final   POC Secobarbital (BAR) 02/08/2021 None Detected  NONE DETECTED (Cut Off Level 300 ng/mL) Final   POC Buprenorphine (BUP) 02/08/2021 None Detected  NONE DETECTED (Cut Off Level 10 ng/mL) Final   POC Oxazepam (BZO) 02/08/2021 None Detected  NONE DETECTED (Cut Off Level 300 ng/mL) Final   POC Cocaine UR 02/08/2021 None Detected  NONE DETECTED (Cut Off Level 300 ng/mL) Final   POC  Methamphetamine UR 02/08/2021 None Detected  NONE DETECTED (Cut Off Level 1000 ng/mL) Final   POC Morphine 02/08/2021 None Detected  NONE DETECTED (Cut Off Level 300 ng/mL) Final   POC Oxycodone UR 02/08/2021 None Detected  NONE DETECTED (Cut Off Level 100 ng/mL) Final   POC Methadone UR 02/08/2021 None Detected  NONE DETECTED (Cut Off Level 300 ng/mL) Final   POC Marijuana UR 02/08/2021 None Detected  NONE DETECTED (Cut Off Level 50 ng/mL) Final   SARSCOV2ONAVIRUS 2 AG 02/08/2021 NEGATIVE  NEGATIVE Final   Comment: (NOTE) SARS-CoV-2 antigen NOT DETECTED.   Negative results are presumptive.  Negative results do not preclude SARS-CoV-2 infection and should not be used as the sole basis for treatment or other patient management decisions, including infection  control decisions, particularly in the presence of clinical signs and  symptoms consistent with COVID-19, or in those who have been in contact with the virus.  Negative results must be combined with clinical observations, patient history, and epidemiological information. The expected result is Negative.  Fact Sheet for Patients: https://www.jennings-kim.com/  Fact Sheet for Healthcare Providers: https://alexander-rogers.biz/  This test is not yet approved or cleared by the [place] FDA and  has been authorized for detection and/or diagnosis of SARS-CoV-2 by FDA under an Emergency Use Authorization (EUA).  This EUA will remain in effect (meaning this test can be used) for the duration of  the COV                          ID-19 declaration under Section 564(b)(1) of the Act, 21 U.S.C. section 360bbb-3(b)(1), unless the authorization is terminated or revoked sooner.     SARS Coronavirus 2 Ag 02/08/2021 Negative  Negative Final   SARSCOV2ONAVIRUS 2 AG 02/08/2021 NEGATIVE  NEGATIVE Final   Comment: (NOTE) SARS-CoV-2 antigen NOT DETECTED.   Negative results are presumptive.  Negative results do not  preclude SARS-CoV-2 infection and should not be used as the sole basis for treatment or other patient management decisions, including infection  control decisions, particularly in the presence of clinical signs and  symptoms consistent with COVID-19, or in those who have been in contact with the virus.  Negative results must be combined with clinical  observations, patient history, and epidemiological information. The expected result is Negative.  Fact Sheet for Patients: https://www.jennings-kim.com/  Fact Sheet for Healthcare Providers: https://alexander-rogers.biz/  This test is not yet approved or cleared by the [place] FDA and  has been authorized for detection and/or diagnosis of SARS-CoV-2 by FDA under an Emergency Use Authorization (EUA).  This EUA will remain in effect (meaning this test can be used) for the duration of  the COV                          ID-19 declaration under Section 564(b)(1) of the Act, 21 U.S.C. section 360bbb-3(b)(1), unless the authorization is terminated or revoked sooner.    Admission on 11/30/2020, Discharged on 12/04/2020  Component Date Value Ref Range Status   Hepatitis B Surface Ag 12/01/2020 NON REACTIVE  NON REACTIVE Final   HCV Ab 12/01/2020 Reactive (A) NON REACTIVE Final   Comment: (NOTE) The CDC recommends that a Reactive HCV antibody result be followed up  with a HCV Nucleic Acid Amplification test.     Hep A IgM 12/01/2020 See Scanned report in [facility] (A) NON REACTIVE Final   Performed at [facility]   Hep B C IgM 12/01/2020 NON REACTIVE  NON REACTIVE Final   Performed at [facility], [address] [place], [place] [address]   Sodium 12/01/2020 137  135 - 145 mmol/L Final   Potassium 12/01/2020 4.8  3.5 - 5.1 mmol/L Final   Chloride 12/01/2020 102  98 - 111 mmol/L Final   CO2 12/01/2020 25  22 - 32 mmol/L Final   Glucose, Bld 12/01/2020 132 (A) 70 - 99 mg/dL Final   Glucose  reference range applies only to samples taken after fasting for at least 8 hours.   BUN 12/01/2020 15  6 - 20 mg/dL Final   Creatinine, Ser 12/01/2020 0.80  0.61 - 1.24 mg/dL Final   Calcium 60/45/4098 9.7  8.9 - 10.3 mg/dL Final   Total Protein 11/91/4782 7.9  6.5 - 8.1 g/dL Final   Albumin 95/62/1308 4.5  3.5 - 5.0 g/dL Final   AST 65/78/4696 59 (A) 15 - 41 U/L Final   ALT 12/01/2020 93 (A) 0 - 44 U/L Final   Alkaline Phosphatase 12/01/2020 77  38 - 126 U/L Final   Total Bilirubin 12/01/2020 0.6  0.3 - 1.2 mg/dL Final   GFR, Estimated 12/01/2020 >60  >60 mL/min Final   Comment: (NOTE) Calculated using the CKD-EPI Creatinine Equation (2021)    Anion gap 12/01/2020 10  5 - 15 Final   Performed at [facility], [address] [place], [place] [address]   TSH 12/01/2020 0.276 (A) 0.350 - 4.500 uIU/mL Final   Comment: Performed by a 3rd Generation assay with a functional sensitivity of <=0.01 uIU/mL. Performed at [facility], [address] [place], [place] [address]    Free T4 12/03/2020 0.76  0.61 - 1.12 ng/dL Final   Comment: (NOTE) Biotin ingestion may interfere with free T4 tests. If the results are inconsistent with the TSH level, previous test results, or the clinical presentation, then consider biotin interference. If needed, order repeat testing after stopping biotin. Performed at [facility], [address], [place], [place] [address]    T3, Free 12/03/2020 2.5  2.0 - 4.4 pg/mL Final   Comment: (NOTE) Performed At: [facility] [address] [place], [place] [id number] Jolene Schimke MD  [id number]    Cholesterol 12/03/2020 174  0 - 200 mg/dL Final   Triglycerides 81/19/1478 84  <150 mg/dL Final   HDL 29/56/2130 61  >40 mg/dL Final   Total CHOL/HDL Ratio 12/03/2020 2.9  RATIO Final   VLDL 12/03/2020 17  0 - 40 mg/dL Final   LDL Cholesterol 12/03/2020 96  0 - 99 mg/dL Final   Comment:        Total  Cholesterol/HDL:CHD Risk Coronary Heart Disease Risk Table                     Men   Women  1/2 Average Risk   3.4   3.3  Average Risk       5.0   4.4  2 X Average Risk   9.6   7.1  3 X Average Risk  23.4   11.0        Use the calculated Patient Ratio above and the CHD Risk Table to determine the patient's CHD Risk.        ATP III CLASSIFICATION (LDL):  <100     mg/dL   Optimal  865-784  mg/dL   Near or Above                    Optimal  130-159  mg/dL   Borderline  696-295  mg/dL   High  >284     mg/dL   Very High Performed at [facility], [address] [place], [place] [address]    HCV Quantitative 12/03/2020 68,900  >50 IU/mL Final   HCV Quantitative Log 12/03/2020 4.838  >1.70 log10 IU/mL Final   Test Information 12/03/2020 Comment   Final   Comment: (NOTE) The quantitative range of this assay is 15 IU/mL to 100 million IU/mL. Performed At: [facility] [address] [place], [place] [id number] Jolene Schimke MD [id number]    Total Protein 12/03/2020 7.1  6.5 - 8.1 g/dL Final   Albumin 63/87/5643 4.3  3.5 - 5.0 g/dL Final   AST 32/95/1884 49 (A) 15 - 41 U/L Final   ALT 12/03/2020 89 (A) 0 - 44 U/L Final   Alkaline Phosphatase 12/03/2020 68  38 - 126 U/L Final   Total Bilirubin 12/03/2020 0.5  0.3 - 1.2 mg/dL Final   Bilirubin, Direct 12/03/2020 <0.1  0.0 - 0.2 mg/dL Final   Indirect Bilirubin 12/03/2020 NOT CALCULATED  0.3 - 0.9 mg/dL Final   Performed at [facility], [address], [place], [place] [address]   Hgb A1c MFr Bld 12/03/2020 5.3  4.8 - 5.6 % Final   Comment: (NOTE)         Prediabetes: 5.7 - 6.4         Diabetes: >6.4         Glycemic control for adults with diabetes: <7.0    Mean Plasma Glucose 12/03/2020 105  mg/dL Final   Comment: (NOTE) Performed At: [facility] [address] [place], [place] [id number] Jolene Schimke MD AT:[id number]    Hepatitis C Quantitation 12/01/2020 27,600   IU/mL Final   HCV log10 12/01/2020 4.441  log10 IU/mL Final   Test Information (HCV): 12/01/2020 Comment   Final   Comment: (NOTE) The quantitative range of this assay is 15 IU/mL to 100 million IU/mL.    Interpretation (HCV): 12/01/2020 Comment   Final   Comment: (NOTE) Positive HCV antibody screen with the presence of HCV RNA is consistent with active infection.  Performed At: [facility] [address] [place], [place] [id number] Jolene SchimkeNagendra Sanjai MD WU:[phone]   Admission on 11/30/2020, Discharged on 11/30/2020  Component Date Value Ref Range Status   Sodium 11/30/2020 133 (A) 135 - 145 mmol/L Final   Potassium 11/30/2020 3.6  3.5 - 5.1 mmol/L Final   Chloride 11/30/2020 100  98 - 111 mmol/L Final   CO2 11/30/2020 24  22 - 32 mmol/L Final   Glucose, Bld 11/30/2020 106 (A) 70 - 99 mg/dL Final   Glucose reference range applies only to samples taken after fasting for at least 8 hours.   BUN 11/30/2020 13  6 - 20 mg/dL Final   Creatinine, Ser 11/30/2020 0.94  0.61 - 1.24 mg/dL Final   Calcium 95/62/130805/28/2022 9.4  8.9 - 10.3 mg/dL Final   Total Protein 65/78/469605/28/2022 6.4 (A) 6.5 - 8.1 g/dL Final   Albumin 29/52/841305/28/2022 3.9  3.5 - 5.0 g/dL Final   AST 24/40/102705/28/2022 34  15 - 41 U/L Final   ALT 11/30/2020 58 (A) 0 - 44 U/L Final   Alkaline Phosphatase 11/30/2020 64  38 - 126 U/L Final   Total Bilirubin 11/30/2020 0.5  0.3 - 1.2 mg/dL Final   GFR, Estimated 11/30/2020 >60  >60 mL/min Final   Comment: (NOTE) Calculated using the CKD-EPI Creatinine Equation (2021)    Anion gap 11/30/2020 9  5 - 15 Final   Performed at [facility], [address] [place], [place] [address]   Alcohol, Ethyl (B) 11/30/2020 <10  <10 mg/dL Final   Comment: (NOTE) Lowest detectable limit for serum alcohol is 10 mg/dL.  For medical purposes only. Performed at [facility], [address] [place], [place] [address]    Salicylate Lvl 11/30/2020 <7.0 (A) 7.0 - 30.0 mg/dL Final   Performed at  [facility], [address] [place], [place] [address]   Acetaminophen (Tylenol), Serum 11/30/2020 <10 (A) 10 - 30 ug/mL Final   Comment: (NOTE) Therapeutic concentrations vary significantly. A range of 10-30 ug/mL  may be an effective concentration for many patients. However, some  are best treated at concentrations outside of this range. Acetaminophen concentrations >150 ug/mL at 4 hours after ingestion  and >50 ug/mL at 12 hours after ingestion are often associated with  toxic reactions.  Performed at [facility], [address] [place], [place] [address]    WBC 11/30/2020 8.6  4.0 - 10.5 K/uL Final   RBC 11/30/2020 4.26  4.22 - 5.81 MIL/uL Final   Hemoglobin 11/30/2020 12.5 (A) 13.0 - 17.0 g/dL Final   HCT 56/43/329505/28/2022 37.7 (A) 39.0 - 52.0 % Final   MCV 11/30/2020 88.5  80.0 - 100.0 fL Final   MCH 11/30/2020 29.3  26.0 - 34.0 pg Final   MCHC 11/30/2020 33.2  30.0 - 36.0 g/dL Final   RDW 18/84/166005/28/2022 13.1  11.5 - 15.5 % Final   Platelets 11/30/2020 220  150 - 400 K/uL Final   nRBC 11/30/2020 0.0  0.0 - 0.2 % Final   Performed at [facility], [address] [place], [place] [address]   Opiates 11/30/2020 POSITIVE (A) NONE DETECTED Final   Cocaine 11/30/2020 NONE DETECTED  NONE DETECTED Final   Benzodiazepines 11/30/2020 NONE DETECTED  NONE DETECTED Final   Amphetamines 11/30/2020 NONE DETECTED  NONE DETECTED Final   Tetrahydrocannabinol 11/30/2020 NONE DETECTED  NONE DETECTED Final   Barbiturates 11/30/2020 NONE DETECTED  NONE DETECTED Final   Comment: (NOTE)  DRUG SCREEN FOR MEDICAL PURPOSES ONLY.  IF CONFIRMATION IS NEEDED FOR ANY PURPOSE, NOTIFY LAB WITHIN 5 DAYS.  LOWEST DETECTABLE LIMITS FOR URINE DRUG SCREEN Drug Class                     Cutoff (ng/mL) Amphetamine and metabolites    1000 Barbiturate and metabolites    200 Benzodiazepine                 200 Tricyclics and metabolites     300 Opiates and metabolites        300 Cocaine and  metabolites        300 THC                            50 Performed at [facility], [address] [place], [place] [address]    SARS Coronavirus 2 by RT PCR 11/30/2020 NEGATIVE  NEGATIVE Final   Comment: (NOTE) SARS-CoV-2 target nucleic acids are NOT DETECTED.  The SARS-CoV-2 RNA is generally detectable in upper respiratory specimens during the acute phase of infection. The lowest concentration of SARS-CoV-2 viral copies this assay can detect is 138 copies/mL. A negative result does not preclude SARS-Cov-2 infection and should not be used as the sole basis for treatment or other patient management decisions. A negative result may occur with  improper specimen collection/handling, submission of specimen other than nasopharyngeal swab, presence of viral mutation(s) within the areas targeted by this assay, and inadequate number of viral copies(<138 copies/mL). A negative result must be combined with clinical observations, patient history, and epidemiological information. The expected result is Negative.  Fact Sheet for Patients:  BloggerCourse.com  Fact Sheet for Healthcare Providers:  SeriousBroker.it  This test is no                          t yet approved or cleared by the [place] FDA and  has been authorized for detection and/or diagnosis of SARS-CoV-2 by FDA under an Emergency Use Authorization (EUA). This EUA will remain  in effect (meaning this test can be used) for the duration of the COVID-19 declaration under Section 564(b)(1) of the Act, 21 U.S.C.section 360bbb-3(b)(1), unless the authorization is terminated  or revoked sooner.       Influenza A by PCR 11/30/2020 NEGATIVE  NEGATIVE Final   Influenza B by PCR 11/30/2020 NEGATIVE  NEGATIVE Final   Comment: (NOTE) The Xpert Xpress SARS-CoV-2/FLU/RSV plus assay is intended as an aid in the diagnosis of influenza from Nasopharyngeal swab specimens and should  not be used as a sole basis for treatment. Nasal washings and aspirates are unacceptable for Xpert Xpress SARS-CoV-2/FLU/RSV testing.  Fact Sheet for Patients: BloggerCourse.com  Fact Sheet for Healthcare Providers: SeriousBroker.it  This test is not yet approved or cleared by the [place] FDA and has been authorized for detection and/or diagnosis of SARS-CoV-2 by FDA under an Emergency Use Authorization (EUA). This EUA will remain in effect (meaning this test can be used) for the duration of the COVID-19 declaration under Section 564(b)(1) of the Act, 21 U.S.C. section 360bbb-3(b)(1), unless the authorization is terminated or revoked.  Performed at [facility], [address], [place], [place] [address]     Blood Alcohol level:  Lab Results  Component Value Date   [facility] <10 02/12/2021   ETH <10 02/08/2021    Metabolic Disorder Labs:  Lab Results  Component Value Date   HGBA1C 5.3 12/03/2020   MPG 105 12/03/2020   MPG 108.28 11/23/2019   No results found for: PROLACTIN Lab Results  Component Value Date   CHOL 174 12/03/2020   TRIG 84 12/03/2020   HDL 61 12/03/2020   CHOLHDL 2.9 12/03/2020   VLDL 17 12/03/2020   LDLCALC 96 12/03/2020   LDLCALC 116 (H) 11/23/2019    Therapeutic Lab Levels: No results found for: LITHIUM No results found for: VALPROATE No components found for:  CBMZ  Physical Findings   AIMS    Flowsheet Row [facility]  AIMS Total Score 0 0      AUDIT    Flowsheet Row [facility]) [facility]  Alcohol Use Disorder Identification Test Final Score (AUDIT) 7 8      PHQ2-9     Flowsheet Row ED from 11/30/2020 in [facility] EM[facility]PART[facility]  PHQ-2 Total Score 5  PHQ-9 Total Score 26      Flowsheet Row ED from 02/12/2021 in [facility] Most recent reading at 02/12/2021  3:51 PM ED from 02/12/2021 in [facility] Most recent reading at 02/12/2021  3:35 PM ED from 02/08/2021 in [facility] Most recent reading at 02/08/2021 10:01 AM  C-SSRS RISK CATEGORY High Risk High Risk Low Risk        Musculoskeletal  Strength & Muscle Tone: within normal limits Gait & Station: normal Patient leans: N/A  Psychiatric Specialty Exam  Presentation  General Appearance: Appropriate for Environment  Eye Contact:Good  Speech:Clear and Coherent; Normal Rate  Speech Volume:Normal  Handedness:Right   Mood and Affect  Mood:Anxious  Affect:Congruent   Thought Process  Thought Processes:Coherent  Descriptions of Associations:Intact  Orientation:Full (Time, Place and Person)  Thought Content:Logical  Diagnosis of Schizophrenia or Schizoaffective disorder in past: No    Hallucinations:Hallucinations: None  Ideas of Reference:None  Suicidal Thoughts:Suicidal Thoughts: No  Homicidal Thoughts:Homicidal Thoughts: No   Sensorium  Memory:Immediate Good; Recent Good; Remote Good  Judgment:Good  Insight:Good   Executive Functions  Concentration:Good  Attention Span:Good  Recall:Good  Fund of Knowledge:Good  Language:Good   Psychomotor Activity  Psychomotor Activity:Psychomotor Activity: Normal   Assets  Assets:Communication Skills; Desire for Improvement; Financial Resources/Insurance; Physical Health; Resilience; Social Support   Sleep  Sleep:Sleep: Good Number of Hours of Sleep: 8   No data recorded  Physical Exam  Physical Exam Vitals and nursing note reviewed.  Constitutional:      Appearance: He is well-developed.  HENT:      Head: Normocephalic.  Eyes:     General:        Right eye: No discharge.        Left eye: No discharge.     Conjunctiva/sclera: Conjunctivae normal.  Cardiovascular:     Rate and Rhythm: Normal rate.     Heart sounds: No murmur heard. Pulmonary:     Effort: Pulmonary effort is normal. No respiratory distress.  Musculoskeletal:        General: Normal range of motion.     Cervical back: Normal range of motion.  Skin:    Coloration: Skin is not jaundiced or pale.  Neurological:     Mental Status: He is  alert and oriented to person, place, and time.  Psychiatric:        Attention and Perception: Attention and perception normal.        Mood and Affect: Mood is anxious.        Speech: Speech normal.        Behavior: Behavior normal.        Thought Content: Thought content normal.        Cognition and Memory: Cognition normal.        Judgment: Judgment is impulsive.   Review of Systems  Constitutional: Negative.  Negative for chills and fever.  HENT: Negative.    Eyes: Negative.   Respiratory: Negative.  Negative for cough.   Cardiovascular: Negative.  Negative for chest pain.  Musculoskeletal: Negative.   Neurological: Negative.   Psychiatric/Behavioral:  The patient is nervous/anxious.   Blood pressure (!) 149/98, pulse 85, temperature 97.7 F (36.5 C), temperature source Tympanic, resp. rate 18, SpO2 100 %. There is no height or weight on file to calculate BMI.  Treatment Plan Summary: Daily contact with patient to assess and evaluate symptoms and progress in treatment and Medication management  Continue current medications, no medication changes.  Jared Jordan has an appointment for screening for admission at [facility] in [facility] on Tuesday, 02/18/2021  Jared Hughs, NP 02/16/2021 5:10 PM

## 2021-02-16 NOTE — ED Notes (Signed)
Patient resting with eyes closed. No sxs of distress. Patient with unlabored breathing. Will continue to monitor for safety

## 2021-02-16 NOTE — Progress Notes (Signed)
Received Jared Jordan this AM in the dining room eating his breakfast. Afterwards he showered and received his medication. He denied all of the psychiatric symptoms and looking forward to transitioning to a long term facility this week. His affect is bright this AM.

## 2021-02-16 NOTE — Group Therapy Note (Signed)
Patient participated 100 percent in group (wrap up/coping skills) and gave positive feedback. 

## 2021-02-16 NOTE — ED Notes (Signed)
Pt given snack. 

## 2021-02-16 NOTE — ED Notes (Signed)
Pt given dinner  

## 2021-02-16 NOTE — ED Notes (Signed)
Pt eating lunch

## 2021-02-16 NOTE — ED Notes (Signed)
Pt eating breakfast 

## 2021-02-16 NOTE — ED Notes (Signed)
Pt asleep in bed. Respirations even and unlabored. Will continue to monitor for safety. ?

## 2021-02-16 NOTE — ED Notes (Signed)
Patient resting quietly. No sxs of distress. Respirations equal and unlabored. Will continue to monitor for safety

## 2021-02-17 DIAGNOSIS — F332 Major depressive disorder, recurrent severe without psychotic features: Secondary | ICD-10-CM | POA: Diagnosis not present

## 2021-02-17 DIAGNOSIS — F1994 Other psychoactive substance use, unspecified with psychoactive substance-induced mood disorder: Secondary | ICD-10-CM | POA: Diagnosis not present

## 2021-02-17 DIAGNOSIS — F17210 Nicotine dependence, cigarettes, uncomplicated: Secondary | ICD-10-CM | POA: Diagnosis not present

## 2021-02-17 MED ORDER — VENLAFAXINE HCL ER 75 MG PO CP24: 75.0000 mg | ORAL_CAPSULE | Freq: Every day | ORAL | 1 refills | Status: DC

## 2021-02-17 MED ORDER — TRAZODONE HCL 50 MG PO TABS: 50.0000 mg | ORAL_TABLET | Freq: Every evening | ORAL | 1 refills | Status: DC | PRN

## 2021-02-17 MED ORDER — HYDROXYZINE HCL 25 MG PO TABS: 25.0000 mg | ORAL_TABLET | Freq: Three times a day (TID) | ORAL | 1 refills | Status: DC | PRN

## 2021-02-17 NOTE — Progress Notes (Signed)
Received Jared Jordan this AM after breakfast, he stated feeling well this morning. He was compliant with his medication. Afterwards he showered.

## 2021-02-17 NOTE — ED Notes (Signed)
Pt asleep in bed. Respirations even and unlabored. Will continue to monitor for safety. ?

## 2021-02-17 NOTE — ED Notes (Signed)
Given lunch

## 2021-02-17 NOTE — ED Notes (Signed)
Given dinner

## 2021-02-17 NOTE — BH Assessment (Signed)
Cognitive Distortions & Restructuring  Cognitive Distortions & Cognitive Restructuring (CBT & DBT Skills)  Date: 02/17/21  Type of Therapy/Therapeutic Modalities:  Participation Level: Active  Objective: The purpose of this group is to discuss and assist patients in identifying cognitive distortions (negative thinking patterns) that can influence their emotions and behaviors. Facilitators will guide conversations that discuss how these cognitive distortions contribute to common disorders such as anxiety and depression.   Therapeutic Goals:  Patient will identify negative thinking patterns they currently experience and how those thoughts influence their behaviors.  Patient will begin to explore the possible misinformation and/or traumatic experiences that influence their negative thought patterns.  Patient will explore the foundations and techniques of cognitive restructuring by reviewing CBT and DBT techniques.  Patient will discuss the   Summary of Patient's Progress:  Jared Jordan was engaged and participated throughout the group session. Jared Jordan contributed to the group's conversation, by assisting facilitator in providing real-life examples of different cognitive distortions. Jared Jordan shared that he has struggled with cognitive distortions,  Magical Thinking and Fortune Telling over the years, however recently learned that it was not healthy thinking in that manner.   Jared Jordan shared that his life experiences and learned behaviors led him to having many cognitive distortions, however he believes with treatment and teachings of coping skills, he can re-construct his thought patterns.

## 2021-02-17 NOTE — ED Provider Notes (Signed)
[name]/OBS ASAP Discharge Summary  Date and Time: 02/17/2021 4:55 PM  Name: Jared Jordan  MRN:  [id number]   Discharge Diagnoses:  Final diagnoses:  Major depressive disorder, recurrent severe without psychotic features (HCC)  Substance abuse (HCC)  Substance induced mood disorder (HCC)    Subjective:  Patient seen and chart reviewed. He reports that he is doing well and reports improvement in his mood. He reports tolerating medications well . He reports that he is looking forward about going to [facility] for substance use treatment and discusses at length how substance use is impacting his mood. He denies SI/HI/AVH  Stay Summary:  Patient was transferred from the ED to the [facility] on 8/10. He was restarted on effexor 37. 5 on 8/10 which was increased to 75 mg on 8/12. He tolerated medication adjustment well and it was continued for duration of stay. He was not a management issue during admission and attended groups and interacted with peers appropriately throughout stay. He was accepted to [facility] for rehab on 8/16. Patient was provided with 14 day sample as well as 30 day rx with one refill.   Total Time spent with patient: 20 minutes  Past Psychiatric History: see H&P Past Medical History:  Past Medical History:  Diagnosis Date   Drug use     Past Surgical History:  Procedure Laterality Date   SHOULDER SURGERY     SHOULDER SURGERY Left 2020   Family History: History reviewed. No pertinent family history. Family Psychiatric History: see H&P Social History:  Social History   Substance and Sexual Activity  Alcohol Use Yes   Alcohol/week: 4.0 - 6.0 standard drinks   Types: 4 - 6 Cans of beer per week   Comment: 2-3 times weekly     Social History   Substance and Sexual Activity  Drug Use Not Currently   Types: IV, Methamphetamines   Comment: using 1gm everyday heroin    Social History   Socioeconomic History   Marital status: Single    Spouse name: Not on file    Number of children: Not on file   Years of education: Not on file   Highest education level: Not on file  Occupational History   Not on file  Tobacco Use   Smoking status: Every Day    Packs/day: 0.25    Years: 1.00    Pack years: 0.25    Types: Cigarettes   Smokeless tobacco: Never  Vaping Use   Vaping Use: Some days   Start date: 11/04/2018   Devices: Jewel Menthol  Substance and Sexual Activity   Alcohol use: Yes    Alcohol/week: 4.0 - 6.0 standard drinks    Types: 4 - 6 Cans of beer per week    Comment: 2-3 times weekly   Drug use: Not Currently    Types: IV, Methamphetamines    Comment: using 1gm everyday heroin   Sexual activity: Yes  Other Topics Concern   Not on file  Social History Narrative   Not on file   Social Determinants of Health   Financial Resource Strain: Not on file  Food Insecurity: Not on file  Transportation Needs: Not on file  Physical Activity: Not on file  Stress: Not on file  Social Connections: Not on file   SDOH:  SDOH Screenings   Alcohol Screen: Low Risk    Last Alcohol Screening Score (AUDIT): 7  Depression (PHQ2-9): Medium Risk   PHQ-2 Score: 26  Financial Resource Strain: Not on file  Food Insecurity: Not on file  Housing: Not on file  Physical Activity: Not on file  Social Connections: Not on file  Stress: Not on file  Tobacco Use: High Risk   Smoking Tobacco Use: Every Day   Smokeless Tobacco Use: Never  Transportation Needs: Not on file    Tobacco Cessation:  n/a  Current Medications:  Current Facility-Administered Medications  Medication Dose Route Frequency Provider Last Rate Last Admin   acetaminophen (TYLENOL) tablet 650 mg  650 mg Oral Q6H PRN Estella Husk, MD       alum & mag hydroxide-simeth (MAALOX/MYLANTA) 200-200-20 MG/5ML suspension 30 mL  30 mL Oral Q4H PRN Estella Husk, MD       hydrOXYzine (ATARAX/VISTARIL) tablet 25 mg  25 mg Oral TID PRN Estella Husk, MD   25 mg at 02/17/21 1436    magnesium hydroxide (MILK OF MAGNESIA) suspension 30 mL  30 mL Oral Daily PRN Estella Husk, MD       traZODone (DESYREL) tablet 50 mg  50 mg Oral QHS PRN Estella Husk, MD   50 mg at 02/13/21 2044   venlafaxine XR (EFFEXOR-XR) 24 hr capsule 75 mg  75 mg Oral Q breakfast Lenard Lance, FNP   75 mg at 02/17/21 3546   Current Outpatient Medications  Medication Sig Dispense Refill   hydrOXYzine (ATARAX/VISTARIL) 25 MG tablet Take 1 tablet (25 mg total) by mouth 3 (three) times daily as needed for anxiety. 90 tablet 1   traZODone (DESYREL) 50 MG tablet Take 1 tablet (50 mg total) by mouth at bedtime as needed for sleep. 30 tablet 1   [START ON 02/18/2021] venlafaxine XR (EFFEXOR-XR) 75 MG 24 hr capsule Take 1 capsule (75 mg total) by mouth daily with breakfast. 30 capsule 1    PTA Medications: (Not in a hospital admission)   Musculoskeletal  Strength & Muscle Tone: within normal limits Gait & Station: normal Patient leans: N/A  Psychiatric Specialty Exam  Presentation  General Appearance: Appropriate for Environment  Eye Contact:Good  Speech:Clear and Coherent; Normal Rate  Speech Volume:Normal  Handedness:Right   Mood and Affect  Mood:Euthymic (bright)  Affect:Appropriate; Congruent   Thought Process  Thought Processes:Coherent; Goal Directed; Linear  Descriptions of Associations:Intact  Orientation:Full (Time, Place and Person)  Thought Content:Logical; WDL  Diagnosis of Schizophrenia or Schizoaffective disorder in past: No    Hallucinations:Hallucinations: None  Ideas of Reference:None  Suicidal Thoughts:Suicidal Thoughts: No  Homicidal Thoughts:Homicidal Thoughts: No   Sensorium  Memory:Immediate Good; Recent Good; Remote Good  Judgment:Good  Insight:Good   Executive Functions  Concentration:Good  Attention Span:Good  Recall:Good  Fund of Knowledge:Good  Language:Good   Psychomotor Activity  Psychomotor Activity:Psychomotor  Activity: Normal   Assets  Assets:Communication Skills; Desire for Improvement; Financial Resources/Insurance; Physical Health; Resilience; Social Support   Sleep  Sleep:Sleep: Good Number of Hours of Sleep: 8   No data recorded  Physical Exam  See SRA for physical exam and ROS Blood pressure (!) 133/97, pulse 96, temperature 97.8 F (36.6 C), temperature source Tympanic, resp. rate 18, SpO2 100 %. There is no height or weight on file to calculate BMI.  See SRA for discharge suicide risk assessment  [facility]. See SRA for additional details  Estella Husk, MD 02/17/2021, 4:55 PM

## 2021-02-17 NOTE — Group Therapy Note (Signed)
Patient participated in wrap up group 100 percent and gave positive feedback.

## 2021-02-17 NOTE — Discharge Instructions (Signed)

## 2021-02-17 NOTE — ED Notes (Signed)
Pt resting in bed. A&O x4, calm and cooperative. Denies SI/HI/AVH. Pt ambulated to dining room, was given a sandwich, fruit, juice, and a cookie, and is currently watching TV. No signs of acute distress noted. Will continue to monitor for safety.

## 2021-02-17 NOTE — ED Provider Notes (Signed)
[facility] Discharge Suicide Risk Assessment   Principal Problem: MDD (major depressive disorder), recurrent episode, severe (HCC) Discharge Diagnoses: Principal Problem:   MDD (major depressive disorder), recurrent episode, severe (HCC) Active Problems:   Substance induced mood disorder (HCC)   Major depressive disorder, recurrent severe without psychotic features (HCC)   Total Time spent with patient: 20 minutes  Musculoskeletal: Strength & Muscle Tone: within normal limits Gait & Station: normal Patient leans: N/A  Psychiatric Specialty Exam  Presentation  General Appearance: Appropriate for Environment  Eye Contact:Good  Speech:Clear and Coherent; Normal Rate  Speech Volume:Normal  Handedness:Right   Mood and Affect  Mood:Euthymic (bright)  Duration of Depression Symptoms: Greater than two weeks  Affect:Appropriate; Congruent   Thought Process  Thought Processes:Coherent; Goal Directed; Linear  Descriptions of Associations:Intact  Orientation:Full (Time, Place and Person)  Thought Content:Logical; WDL  History of Schizophrenia/Schizoaffective disorder:No  Duration of Psychotic Symptoms:No data recorded Hallucinations:Hallucinations: None  Ideas of Reference:None  Suicidal Thoughts:Suicidal Thoughts: No  Homicidal Thoughts:Homicidal Thoughts: No   Sensorium  Memory:Immediate Good; Recent Good; Remote Good  Judgment:Good  Insight:Good   Executive Functions  Concentration:Good  Attention Span:Good  Recall:Good  Fund of Knowledge:Good  Language:Good   Psychomotor Activity  Psychomotor Activity:Psychomotor Activity: Normal   Assets  Assets:Communication Skills; Desire for Improvement; Financial Resources/Insurance; Physical Health; Resilience; Social Support   Sleep  Sleep:Sleep: Good    Physical Exam: Physical Exam Constitutional:      Appearance: Normal appearance. He is normal weight.  HENT:     Head: Normocephalic and  atraumatic.  Eyes:     Extraocular Movements: Extraocular movements intact.  Pulmonary:     Effort: Pulmonary effort is normal.  Neurological:     General: No focal deficit present.     Mental Status: He is alert.   Review of Systems  Constitutional:  Negative for chills and fever.  HENT:  Negative for hearing loss.   Eyes:  Negative for discharge and redness.  Respiratory:  Negative for cough.   Cardiovascular:  Negative for chest pain.  Gastrointestinal:  Negative for abdominal pain.  Musculoskeletal:  Negative for myalgias.  Neurological:  Negative for headaches.  Psychiatric/Behavioral:  Positive for substance abuse. Negative for depression, hallucinations and suicidal ideas. The patient is not nervous/anxious.   Blood pressure (!) 133/97, pulse 96, temperature 97.8 F (36.6 C), temperature source Tympanic, resp. rate 18, SpO2 100 %. There is no height or weight on file to calculate BMI.  Mental Status Per Nursing Assessment::   On Admission:   reported SI initially without plan or intent; now denies  Demographic Factors:  Male, Caucasian, Low socioeconomic status, and Unemployed  Loss Factors: Loss of significant relationship and Financial problems/change in socioeconomic status  Historical Factors: Anniversary of important loss  Risk Reduction Factors:   Sense of responsibility to family, Positive social support, Positive therapeutic relationship, and Positive coping skills or problem solving skills  Continued Clinical Symptoms:  Depression:   Comorbid alcohol abuse/dependence Alcohol/Substance Abuse/Dependencies  Cognitive Features That Contribute To Risk:  None    Suicide Risk:  Minimal: No identifiable suicidal ideation.  Patients presenting with no risk factors but with morbid ruminations; may be classified as minimal risk based on the severity of the depressive symptoms   Follow-up Information     Services, [name]. Go on 02/18/2021.   Why: You  have a Screening for Admission appointment on Tuesday, 02/18/21 at 7:45am. If you are still at the [facility], you can arrive before 9:00am.  If not, you must arrive at 7:45am. Please be sure to bring your discharge paperwork, including any lists of medications. For admission you will need a 30-day supply of medications and at least 1 refill (prescription). Please call agency for any additional questions or concerns. Contact information: [address] [place] [address] [phone]         [facility]. Go to.   Specialty: Behavioral Health Why: Walk-in hours are Monday-Thursday from 7:00am-11:00am. Please go during these hours to establish outpatient medication mangement and therapy services. Be sure to have any discharge paperwork, including a list of medications. Please call office for any additional questions or concerns. Contact information: [address] [place] [address] [phone]        [facility] Call.   Specialty: Addiction Medicine Why: IF NEEDED - A referral has been made on your behalf. Please call to inquire about bed availability. Contact information: [address] [place] [address] [phone]                 Plan Of Care/Follow-up recommendations:  Activity:  as tolerated Diet:  regular Other:     Patient is instructed prior to discharge to: Take all medications as prescribed by his/her mental healthcare provider. Report any adverse effects and or reactions from the medicines to his/her outpatient provider promptly. Patient has been instructed & cautioned: To not engage in alcohol and or illegal drug use while on prescription medicines. In the event of worsening symptoms, patient is instructed to call the crisis hotline, 911 and or go to the nearest ED for appropriate evaluation and treatment of symptoms. To follow-up with his/her primary care provider for your other  medical issues, concerns and or health care needs.   Patient to be discharged to [facility] for substance abuse treatment on morning of 8/16. Patient provided with 14 day samples of medications as well as 30 day Rx with 1 refill of medications   Estella Husk, MD 02/17/2021, 3:06 PM

## 2021-02-18 DIAGNOSIS — F1994 Other psychoactive substance use, unspecified with psychoactive substance-induced mood disorder: Secondary | ICD-10-CM | POA: Diagnosis not present

## 2021-02-18 DIAGNOSIS — F17210 Nicotine dependence, cigarettes, uncomplicated: Secondary | ICD-10-CM | POA: Diagnosis not present

## 2021-02-18 DIAGNOSIS — F332 Major depressive disorder, recurrent severe without psychotic features: Secondary | ICD-10-CM | POA: Diagnosis not present

## 2021-02-18 NOTE — ED Notes (Signed)
Pt asleep in bed. Respirations even and unlabored. Will continue to monitor for safety. ?

## 2021-02-18 NOTE — Discharge Summary (Signed)
Jared Jordan to be D/C'd to [facility] per NP order. Discussed with the patient and all questions fully answered. An After Visit Summary was printed and given to the patient. Medication samples and scripts were given to patient. Patient escorted out and D/Ced via safe transport.  [name]  02/18/2021 8:56 AM

## 2021-02-18 NOTE — Progress Notes (Signed)
Pt is awake, alert and oriented. Administered scheduled meds with no incident. Pt denies pain, SI/HI/AVH at this time. Staff will monitor for pt's safety.

## 2021-03-06 ENCOUNTER — Other Ambulatory Visit: Payer: Self-pay

## 2021-03-06 ENCOUNTER — Emergency Department (HOSPITAL_COMMUNITY)
Admission: EM | Admit: 2021-03-06 | Discharge: 2021-03-06 | Disposition: A | Discharge status: Home / Self Care | Payer: Self-pay | Attending: Emergency Medicine | Admitting: Emergency Medicine

## 2021-03-06 DIAGNOSIS — Z5321 Procedure and treatment not carried out due to patient leaving prior to being seen by health care provider: Secondary | ICD-10-CM | POA: Insufficient documentation

## 2021-03-06 DIAGNOSIS — S0993XA Unspecified injury of face, initial encounter: Secondary | ICD-10-CM | POA: Insufficient documentation
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
## 2021-03-10 NOTE — Plan of Care (Signed)
Hep C positive RNA viral quant results sent to confidential fax at [facility] as part of confidential communicable disease reporting on 03/10/21 along with copy of his positive test result after multiple unsuccessful attempts to reach patient about his positive Hep C lab results.   Bartholomew Crews, MD, Celene Skeen

## 2021-03-14 ENCOUNTER — Other Ambulatory Visit: Payer: Self-pay

## 2021-03-14 ENCOUNTER — Emergency Department (HOSPITAL_COMMUNITY)
Admission: EM | Admit: 2021-03-14 | Discharge: 2021-03-14 | Disposition: A | Discharge status: Home / Self Care | Payer: Self-pay | Attending: Emergency Medicine | Admitting: Emergency Medicine

## 2021-03-14 ENCOUNTER — Other Ambulatory Visit (HOSPITAL_COMMUNITY)
Admission: EM | Admit: 2021-03-14 | Discharge: 2021-03-18 | Disposition: A | Discharge status: Home / Self Care | Payer: No Payment, Other | Attending: Psychiatry | Admitting: Psychiatry

## 2021-03-14 ENCOUNTER — Encounter (HOSPITAL_COMMUNITY): Payer: Self-pay

## 2021-03-14 DIAGNOSIS — F419 Anxiety disorder, unspecified: Secondary | ICD-10-CM | POA: Insufficient documentation

## 2021-03-14 DIAGNOSIS — Z79899 Other long term (current) drug therapy: Secondary | ICD-10-CM | POA: Insufficient documentation

## 2021-03-14 DIAGNOSIS — F332 Major depressive disorder, recurrent severe without psychotic features: Secondary | ICD-10-CM

## 2021-03-14 DIAGNOSIS — F339 Major depressive disorder, recurrent, unspecified: Secondary | ICD-10-CM | POA: Diagnosis present

## 2021-03-14 DIAGNOSIS — R45851 Suicidal ideations: Secondary | ICD-10-CM | POA: Diagnosis not present

## 2021-03-14 DIAGNOSIS — Z5900 Homelessness unspecified: Secondary | ICD-10-CM | POA: Insufficient documentation

## 2021-03-14 DIAGNOSIS — Z20822 Contact with and (suspected) exposure to covid-19: Secondary | ICD-10-CM | POA: Insufficient documentation

## 2021-03-14 DIAGNOSIS — Z720 Tobacco use: Secondary | ICD-10-CM | POA: Insufficient documentation

## 2021-03-14 DIAGNOSIS — F17210 Nicotine dependence, cigarettes, uncomplicated: Secondary | ICD-10-CM | POA: Insufficient documentation

## 2021-03-14 LAB — CBC WITH DIFFERENTIAL/PLATELET
Abs Immature Granulocytes: 0.08 10*3/uL — ABNORMAL HIGH (ref 0.00–0.07)
Basophils Absolute: 0 10*3/uL (ref 0.0–0.1)
Basophils Relative: 0 %
Eosinophils Absolute: 0.1 10*3/uL (ref 0.0–0.5)
Eosinophils Relative: 2 %
HCT: 47.9 % (ref 39.0–52.0)
Hemoglobin: 15.2 g/dL (ref 13.0–17.0)
Immature Granulocytes: 1 %
Lymphocytes Relative: 21 %
Lymphs Abs: 1.5 10*3/uL (ref 0.7–4.0)
MCH: 29 pg (ref 26.0–34.0)
MCHC: 31.7 g/dL (ref 30.0–36.0)
MCV: 91.2 fL (ref 80.0–100.0)
Monocytes Absolute: 0.6 10*3/uL (ref 0.1–1.0)
Monocytes Relative: 9 %
Neutro Abs: 4.8 10*3/uL (ref 1.7–7.7)
Neutrophils Relative %: 67 %
Platelets: 266 10*3/uL (ref 150–400)
RBC: 5.25 MIL/uL (ref 4.22–5.81)
RDW: 13.8 % (ref 11.5–15.5)
WBC: 7.2 10*3/uL (ref 4.0–10.5)
nRBC: 0 % (ref 0.0–0.2)

## 2021-03-14 LAB — RESP PANEL BY RT-PCR (FLU A&B, COVID) ARPGX2
Influenza A by PCR: NEGATIVE
Influenza B by PCR: NEGATIVE
SARS Coronavirus 2 by RT PCR: NEGATIVE

## 2021-03-14 LAB — RAPID URINE DRUG SCREEN, HOSP PERFORMED
Amphetamines: NOT DETECTED
Barbiturates: NOT DETECTED
Benzodiazepines: NOT DETECTED
Cocaine: NOT DETECTED
Opiates: NOT DETECTED
Tetrahydrocannabinol: NOT DETECTED

## 2021-03-14 LAB — COMPREHENSIVE METABOLIC PANEL
ALT: 66 U/L — ABNORMAL HIGH (ref 0–44)
AST: 54 U/L — ABNORMAL HIGH (ref 15–41)
Albumin: 4.4 g/dL (ref 3.5–5.0)
Alkaline Phosphatase: 78 U/L (ref 38–126)
Anion gap: 11 (ref 5–15)
BUN: 13 mg/dL (ref 6–20)
CO2: 24 mmol/L (ref 22–32)
Calcium: 9.8 mg/dL (ref 8.9–10.3)
Chloride: 104 mmol/L (ref 98–111)
Creatinine, Ser: 0.7 mg/dL (ref 0.61–1.24)
GFR, Estimated: >60 mL/min (ref >60)
Glucose, Bld: 77 mg/dL (ref 70–99)
Potassium: 4.5 mmol/L (ref 3.5–5.1)
Sodium: 139 mmol/L (ref 135–145)
Total Bilirubin: 0.4 mg/dL (ref 0.3–1.2)
Total Protein: 7.6 g/dL (ref 6.5–8.1)

## 2021-03-14 LAB — ETHANOL: Alcohol, Ethyl (B): <10 mg/dL (ref <10)

## 2021-03-14 LAB — ACETAMINOPHEN LEVEL: Acetaminophen (Tylenol), Serum: <10 ug/mL — ABNORMAL LOW (ref 10–30)

## 2021-03-14 LAB — SALICYLATE LEVEL: Salicylate Lvl: <7 mg/dL — ABNORMAL LOW (ref 7.0–30.0)

## 2021-03-14 MED ORDER — MAGNESIUM HYDROXIDE 400 MG/5ML PO SUSP: 30.0000 mL | Freq: Every day | ORAL | Status: DC | PRN

## 2021-03-14 MED ORDER — VENLAFAXINE HCL ER 75 MG PO CP24
75.0000 mg | ORAL_CAPSULE | Freq: Every day | ORAL | Status: DC
  Administered 2021-03-15 – 2021-03-18 (×4): 75 mg via ORAL
  Filled 2021-03-14 (×4): qty 1

## 2021-03-14 MED ORDER — ALUM & MAG HYDROXIDE-SIMETH 200-200-20 MG/5ML PO SUSP: 30.0000 mL | ORAL | Status: DC | PRN

## 2021-03-14 MED ORDER — TRAZODONE HCL 50 MG PO TABS: 50.0000 mg | ORAL_TABLET | Freq: Every evening | ORAL | Status: DC | PRN

## 2021-03-14 MED ORDER — ACETAMINOPHEN 325 MG PO TABS: 650.0000 mg | ORAL_TABLET | Freq: Four times a day (QID) | ORAL | Status: DC | PRN

## 2021-03-14 MED ORDER — HYDROXYZINE HCL 25 MG PO TABS: 25.0000 mg | ORAL_TABLET | Freq: Three times a day (TID) | ORAL | Status: DC | PRN

## 2021-03-14 MED ORDER — TRAZODONE HCL 100 MG PO TABS: 50.0000 mg | ORAL_TABLET | Freq: Every evening | ORAL | Status: DC | PRN

## 2021-03-14 MED ORDER — HYDROXYZINE HCL 25 MG PO TABS
25.0000 mg | ORAL_TABLET | Freq: Three times a day (TID) | ORAL | Status: DC | PRN
  Administered 2021-03-16 – 2021-03-17 (×2): 25 mg via ORAL
  Filled 2021-03-14 (×2): qty 1

## 2021-03-14 MED ORDER — VENLAFAXINE HCL ER 75 MG PO CP24
75.0000 mg | ORAL_CAPSULE | Freq: Every day | ORAL | Status: DC
  Administered 2021-03-14: 75 mg via ORAL
  Filled 2021-03-14: qty 1

## 2021-03-14 NOTE — ED Notes (Signed)
Pt belongings placed in triage patients cabinet. Pt has 2 belonging bags.

## 2021-03-14 NOTE — ED Provider Notes (Signed)
[facility]-EMERGENCY DEPT Provider Note   CSN: [id number] Arrival date & time: 03/14/21  0357     History Chief Complaint  Patient presents with   Suicidal    Jared Jordan is a 30 y.o. male presents to the emergency department with complaints of suicidal ideation.  Reports he recently dropped out of school and lost his job after his father died.  Reports he feels like he is in a daze and found himself walking in and out of traffic today.  Reports he does not care if he lives or dies and wishes to die.  Denies other attempts at suicide today.  Patient reports history of suicidal ideation and hospitalization for same.  Reports he drinks socially but has not been drinking recently because he does not like to drink when he is sad.  Denies drug use.  Denies additional specific aggravating or alleviating factors.  The history is provided by the patient and medical records. No language interpreter was used.      Past Medical History:  Diagnosis Date   Drug use     Patient Active Problem List   Diagnosis Date Noted   MDD (major depressive disorder), recurrent severe, without psychosis (HCC) 02/08/2021   MDD (major depressive disorder), recurrent episode, severe (HCC) 11/30/2020   Major depressive disorder, recurrent severe without psychotic features (HCC)    Substance induced mood disorder (HCC) 11/22/2019    Past Surgical History:  Procedure Laterality Date   SHOULDER SURGERY     SHOULDER SURGERY Left 2020       No family history on file.  Social History   Tobacco Use   Smoking status: Every Day    Packs/day: 0.25    Years: 1.00    Pack years: 0.25    Types: Cigarettes   Smokeless tobacco: Never  Vaping Use   Vaping Use: Some days   Start date: 11/04/2018   Devices: Jewel Menthol  Substance Use Topics   Alcohol use: Yes    Alcohol/week: 4.0 - 6.0 standard drinks    Types: 4 - 6 Cans of beer per week    Comment: 2-3 times weekly   Drug use:  Not Currently    Types: IV, Methamphetamines    Comment: using 1gm everyday heroin    Home Medications Prior to Admission medications   Medication Sig Start Date End Date Taking? Authorizing Provider  hydrOXYzine (ATARAX/VISTARIL) 25 MG tablet Take 1 tablet (25 mg total) by mouth 3 (three) times daily as needed for anxiety. 02/17/21   Estella Husk, MD  traZODone (DESYREL) 50 MG tablet Take 1 tablet (50 mg total) by mouth at bedtime as needed for sleep. 02/17/21   Estella Husk, MD  venlafaxine XR (EFFEXOR-XR) 75 MG 24 hr capsule Take 1 capsule (75 mg total) by mouth daily with breakfast. 02/18/21   Estella Husk, MD    Allergies    Patient has no known allergies.  Review of Systems   Review of Systems  Constitutional:  Negative for appetite change, diaphoresis, fatigue, fever and unexpected weight change.  HENT:  Negative for mouth sores.   Eyes:  Negative for visual disturbance.  Respiratory:  Negative for cough, chest tightness, shortness of breath and wheezing.   Cardiovascular:  Negative for chest pain.  Gastrointestinal:  Negative for abdominal pain, constipation, diarrhea, nausea and vomiting.  Endocrine: Negative for polydipsia, polyphagia and polyuria.  Genitourinary:  Negative for dysuria, frequency, hematuria and urgency.  Musculoskeletal:  Negative for  back pain and neck stiffness.  Skin:  Negative for rash.  Allergic/Immunologic: Negative for immunocompromised state.  Neurological:  Negative for syncope, light-headedness and headaches.  Hematological:  Does not bruise/bleed easily.  Psychiatric/Behavioral:  Positive for sleep disturbance and suicidal ideas. The patient is not nervous/anxious.    Physical Exam Updated Vital Signs BP (!) 145/87 (BP Location: Left Arm)   Pulse (!) 58   Temp 97.6 F (36.4 C) (Oral)   Resp 16   Ht 5\' 10"  (1.778 m)   Wt 77.1 kg   SpO2 100%   BMI 24.39 kg/m   Physical Exam Vitals and nursing note reviewed.   Constitutional:      General: He is not in acute distress.    Appearance: He is well-developed. He is not ill-appearing.  HENT:     Head: Normocephalic.  Eyes:     General: No scleral icterus.    Conjunctiva/sclera: Conjunctivae normal.  Cardiovascular:     Rate and Rhythm: Normal rate.  Pulmonary:     Effort: Pulmonary effort is normal.  Abdominal:     General: There is no distension.  Musculoskeletal:        General: Normal range of motion.     Cervical back: Normal range of motion.  Skin:    General: Skin is warm and dry.  Neurological:     Mental Status: He is alert.  Psychiatric:        Attention and Perception: He does not perceive auditory or visual hallucinations.        Mood and Affect: Mood is depressed. Affect is tearful.        Speech: Speech normal.        Behavior: Behavior is not withdrawn. Behavior is cooperative.        Thought Content: Thought content includes suicidal ideation. Thought content does not include homicidal ideation. Thought content includes suicidal plan. Thought content does not include homicidal plan.    ED Results / Procedures / Treatments   Labs (all labs ordered are listed, but only abnormal results are displayed) Labs Reviewed  COMPREHENSIVE METABOLIC PANEL - Abnormal; Notable for the following components:      Result Value   AST 54 (*)    ALT 66 (*)    All other components within normal limits  CBC WITH DIFFERENTIAL/PLATELET - Abnormal; Notable for the following components:   Abs Immature Granulocytes 0.08 (*)    All other components within normal limits  ACETAMINOPHEN LEVEL - Abnormal; Notable for the following components:   Acetaminophen (Tylenol), Serum <10 (*)    All other components within normal limits  SALICYLATE LEVEL - Abnormal; Notable for the following components:   Salicylate Lvl <7.0 (*)    All other components within normal limits  RESP PANEL BY RT-PCR (FLU A&B, COVID) ARPGX2  ETHANOL  RAPID URINE DRUG SCREEN,  HOSP PERFORMED    Procedures Procedures   Medications Ordered in ED Medications  hydrOXYzine (ATARAX/VISTARIL) tablet 25 mg (has no administration in time range)  traZODone (DESYREL) tablet 50 mg (has no administration in time range)  venlafaxine XR (EFFEXOR-XR) 24 hr capsule 75 mg (has no administration in time range)    ED Course  I have reviewed the triage vital signs and the nursing notes.  Pertinent labs & imaging results that were available during my care of the patient were reviewed by me and considered in my medical decision making (see chart for details).    MDM Rules/Calculators/A&P  Patient presents with suicidal ideation and was seen walking in and out of traffic today.  Has had previous hospitalizations for same.  Medical clearance lab work pending.  Patient without somatic complaints.  Will be evaluated by TTS.  5:33 AM Pt signed out to oncoming team for pending labs.  TTS pending.  Dispo per TTS.   Final Clinical Impression(s) / ED Diagnoses Final diagnoses:  Suicidal ideation    Rx / DC Orders ED Discharge Orders     None        Marguita Venning, Boyd Kerbs 03/14/21 [id number]    Nira Conn, MD 03/15/21 [phone]
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
## 2021-03-14 NOTE — ED Notes (Signed)
Pt given meal

## 2021-03-14 NOTE — ED Notes (Signed)
Pt sleeping at present, no distress noted, monitoring for safety. 

## 2021-03-14 NOTE — ED Notes (Signed)
Pt given dinner  

## 2021-03-14 NOTE — ED Notes (Addendum)
Pt sleeping at present.  A&O x 4.  No distress noted. Pt remains SI.  Monitoring for safety.

## 2021-03-14 NOTE — ED Notes (Signed)
Patient admitted to the [facility], with SI denies HI or Auditory/Visual Hallucinations. Patient is cooperative/ flat affect. Skin is intact and normal in color. Patient reoriented to the unit, given lunch and consents signed. Nursing staff will continue to monitor.

## 2021-03-14 NOTE — ED Triage Notes (Signed)
Pt reports recurrent suicidal ideation. Pt says earlier he was zoned out walking around and almost walked into traffic.

## 2021-03-14 NOTE — Progress Notes (Signed)
Patient did not want to attend group this afternoon, therefore patient sat by the nurses station.Patient educated on the importance of going to group and not being able to sleep in the bed all day. Staff will continue to monitor.   Social worker provided patient with an application for a resource in [place].

## 2021-03-14 NOTE — BH Assessment (Signed)
Comprehensive Clinical Assessment (CCA) Note  03/14/2021 Jared Jordan [id number]  DISPOSITIONMilinda Jordan: Jared Jordan recommends a inpatient admission to assist with stabilization. [facility].   Flowsheet Row ED [name]  C-SSRS RISK CATEGORY High Risk Error: Q3, 4, or 5 should not be populated when Q2 is No High Risk      The patient demonstrates the following risk factors for suicide: Chronic risk factors for suicide include: substance use disorder. Acute risk factors for suicide include:  SA issues . Protective factors for this patient include: coping skills. Considering these factors, the overall suicide risk at this point appears to be high. Patient is not appropriate for outpatient follow up.    Jared Jordan is a 30 year old male that presents to Jared Jordan voluntarily with ongoing S/I. Patient voices a plan to "jump off a bridge." Patient denies any H/I or AVH. Patient states he recently was [facility] ongoing [facility] use although after arriving was informed by staff that he was there in the last 90 days and did not qualify for any ongoing services. Patient states at that time he became suicidal and came to the hospital because he "is done with it all." Patient has a extensive history of alcohol/opiate use although states he has been maintaining his sobriety for over one month. Patient's UDS was negative and BAL was less than 5 this date. Patient states he is currently homeless and denies access to weapons. Patient also reports his S/I is associated with "thinking about his father" who passed away earlier this year. They shared a [place] OuchBirthday and last week was their Birthdays. States that memories came back of his father and  he has since not felt like himself.   Patient has been seen multiple times before presenting with same symptoms. Per chart review patient was discharged on 02/17/21 from Jared Jordan. Jared Jordan writes on discharge that date:  Patient was transferred from the ED to the [facility] on 8/10. He was restarted on effexor 37. 5 on 8/10 which was increased to 75 mg on 8/12. He tolerated medication adjustment well and it was continued for duration of stay. He was not a management issue during admission and attended groups and interacted with peers appropriately throughout stay. He was accepted to Jared Jordan for rehab on 8/16. Patient was provided with 14 day sample as well as 30 day rx with one refill. Patient was initially assessed on 02/12/21 before being admitted to Jared Jordan and discharged on 02/17/21. Patient presented with S/I that date also.   Per history, patient reports a history of one suicide attempt by cutting himself. The incident occurred 7 months ago triggered by the death of his father. Patient is not able to identify any self mutilating behaviors. Current depressive symptoms: hopelessness, worthlessness, isolating self from others, guilt, anger, irritability, and insomnia. He sleeps 2-3 hours per night. Appetite is poor. States that he has loss 15 pounds in 6 months.   Patient reports a history of one suicide attempt by cutting himself. The incident occurred 7 months ago triggered by the death of his father. Patient is not able to identify any self mutilating behaviors. Current depressive symptoms: hopelessness, worthlessness, isolating self from others, guilt, anger, irritability, and insomnia. He sleeps 2-3 hours per night. Appetite is poor. States that  he has loss 15 pounds in 6 months.    Patient does not have a current support systems. States that his family/other lives out of state. Denies history of trauma and/or abuse. Currently lives alone in a apartment. 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  Highest level of education is some college. No family history of mental health illness.    Jared Jordan writes on admission this date: Jared Jordan is a 30 y.o. male presents to the emergency department with complaints of suicidal ideation.  Reports he recently dropped out of school and lost his job after his father died.  Reports he feels like he is in a daze and found himself walking in and out of traffic today.  Reports he does not care if he lives or dies and wishes to die.  Denies other attempts at suicide today.  Patient reports history of suicidal ideation and hospitalization for same.  Reports he drinks socially but has not been drinking recently because he does not like to drink when he is sad.  Denies drug use.  Denies additional specific aggravating or alleviating factors.  Patient is oriented x 5 and speaks with normal tone and volume. Patient is observed to be depressed with affect congruent. Patient's memory is intact with thoughts organized. Patient does not appear to be responding to internal stimuli.   Chief Complaint:  Chief Complaint  Patient presents with   Suicidal   Visit Diagnosis: MDD recurrent without psychotic features, severe     CCA Screening, Triage and Referral (STR)  Patient Reported Information How did you hear about [place]? Self  What Is the Reason for Your Visit/Call Today? Ongoing S/I associated with SA use  How Long Has This Been Causing You Problems? > than 6 months  What Do You Feel Would Help You the Most Today? Alcohol or Drug Use Treatment   Have You Recently Had Any Thoughts About Hurting Yourself? Yes  Are You Planning to Commit Suicide/Harm Yourself At This time? Yes   Have you Recently Had Thoughts About Hurting Someone [facility]? No  Are You Planning to Harm Someone at This Time? No  Explanation: No data recorded  Have You Used Any Alcohol or Drugs in the Past 24 Hours? No  How Long Ago Did You Use Drugs or Alcohol? No data  recorded What Did You Use and How Much? No data recorded  Do You Currently Have a Therapist/Psychiatrist? No  Name of Therapist/Psychiatrist: No data recorded  Have You Been Recently Discharged From Any Office Practice or Programs? No  Explanation of Discharge From Practice/Jordan: No data recorded    CCA Screening Triage Referral Assessment Type of Contact: Face-to-Face  Telemedicine Service Delivery:   Is this Initial or Reassessment? Initial Assessment  Date Telepsych consult ordered in [facility]:  11/30/20  Time Telepsych consult ordered in [facility]:  0952  Location of Assessment: [facility] ED  Provider Location: Other (comment) ([facility])   Collateral Involvement: None at this time   Does Patient Have a Court Appointed Legal Guardian? No data recorded Name and Contact of Legal Guardian: No data recorded If Minor and Not Living with Parent(s), Who has Custody? NA  Is CPS involved or ever been involved? Never  Is APS involved or ever been involved? Never   Patient Determined To Be At Risk for Harm To Self or Others Based on Review of Patient Reported Information or Presenting Complaint? Yes, for Self-Harm  Method: No data recorded Availability of Means: No data recorded  Intent: No data recorded Notification Required: No data recorded Additional Information for Danger to Others Potential: No data recorded Additional Comments for Danger to Others Potential: No data recorded Are There Guns or Other Weapons in Your Home? No data recorded Types of Guns/Weapons: No data recorded Are These Weapons Safely Secured?                            No data recorded Who Could Verify You Are Able To Have These Secured: No data recorded Do You Have any Outstanding Charges, Pending Court Dates, Parole/Probation? No data recorded Contacted To Inform of Risk of Harm To Self or Others: Other: Comment (NA)    Does Patient Present under Involuntary Commitment? No  IVC Papers Initial File Date: No data  recorded  [place] of Residence: [place]   Patient Currently Receiving the Following Services: Not Receiving Services   Determination of Need: Urgent (48 hours)   Options For Referral: Outpatient Therapy     CCA Biopsychosocial Patient Reported Schizophrenia/Schizoaffective Diagnosis in Past: No   Strengths: Patient states he is willing to participate in treatment   Mental Health Symptoms Depression:   Change in energy/activity; Hopelessness   Duration of Depressive symptoms:  Duration of Depressive Symptoms: Greater than two weeks   Mania:   None   Anxiety:    Difficulty concentrating; Worrying   Psychosis:   None   Duration of Psychotic symptoms:    Trauma:   None   Obsessions:   Disrupts routine/functioning   Compulsions:   None   Inattention:   None   Hyperactivity/Impulsivity:   N/A   Oppositional/Defiant Behaviors:   None   Emotional Irregularity:   Mood lability; Chronic feelings of emptiness   Other Mood/Personality Symptoms:   NA    Mental Status Exam Appearance and self-care  Stature:   Average   Weight:   Average weight   Clothing:   Casual; Neat/clean   Grooming:   Normal   Cosmetic use:   None   Posture/gait:   Normal   Motor activity:   Not Remarkable   Sensorium  Attention:   Normal   Concentration:   Anxiety interferes   Orientation:   Object; Person; Place; Situation; Time   Recall/memory:   Normal   Affect and Mood  Affect:   Depressed   Mood:   Depressed; Hopeless   Relating  Eye contact:   Normal   Facial expression:   Depressed; Anxious   Attitude toward examiner:   Cooperative   Thought and Language  Speech flow:  Clear and Coherent   Thought content:   Appropriate to Mood and Circumstances   Preoccupation:   None   Hallucinations:   None   Organization:  No data recorded  [employer] of Knowledge:   Average   Intelligence:   Average    Abstraction:   Functional   Judgement:   Fair   Dance movement psychotherapist:   Realistic   Insight:   Lacking   Decision Making:   Impulsive   Social Functioning  Social Maturity:   Impulsive   Social Judgement:   Normal   Stress  Stressors:   Surveyor, quantity; Family conflict; Work   Coping Ability:   Deficient supports   Skill Deficits:   Interpersonal   Supports:   Support needed     Religion: Religion/Spirituality Are You A Religious Person?: No How Might This Affect Treatment?: not assessed  Leisure/Recreation: Leisure / Recreation Do You Have Hobbies?: No  Exercise/Diet: Exercise/Diet Do You Exercise?: No Have You Gained or Lost A Significant Amount of Weight in the Past Six Months?: Yes-Lost Number of Pounds Lost?: 20 Do You Follow a Special Diet?: No Do You Have Any Trouble Sleeping?: Yes Explanation of Sleeping Difficulties: Gets <4H/D   CCA Employment/Education Employment/Work Situation: Employment / Work Situation Employment Situation: Surveyor, minerals Job has Been Impacted by Current Illness: No Has Patient ever Been in the [employer]?: No Did You Receive Any Psychiatric Treatment/Services While in the [employer]?: No  Education: Engineer, civil (consulting) Currently Attending: Pt is a Health and safety inspector at [employer] Last Grade Completed: 12 Did You Product manager?: Yes What Type of College Degree Do you Have?: Patient has attended [employer] Did You Have An Individualized Education Jordan (IIEP): No Did You Have Any Difficulty At School?: No Patient's Education Has Been Impacted by Current Illness: No   CCA Family/Childhood History Family and Relationship History: Family history Marital status: Single Does patient have children?: No  Childhood History:  Childhood History By whom was/is the patient raised?: Both parents Did patient suffer any verbal/emotional/physical/sexual abuse as a child?: No Did patient suffer from severe childhood neglect?: No Has patient  ever been sexually abused/assaulted/raped as an adolescent or adult?: No Was the patient ever a victim of a crime or a disaster?: No Witnessed domestic violence?: No Has patient been affected by domestic violence as an adult?: No  Child/Adolescent Assessment:     CCA Substance Use Alcohol/Drug Use: Alcohol / Drug Use Pain Medications: None Prescriptions: None Over the Counter: None History of alcohol / drug use?: Yes Longest period of sobriety (when/how long): 7 years; relapsed in 2020 Negative Consequences of Use: Financial, Personal relationships, Work / School Withdrawal Symptoms:  (patient has been clean for a year and has no current withdrawal symptoms) Substance #1 Name of Substance 1: Opiates/Percocets 1 - Age of First Use: started January 2022 1 - Amount (size/oz): 5-10 mgs 1 - Frequency: daily 1 - Duration: on-going 1 - Last Use / Amount: 1 week ago 1 - Method of Aquiring: unknown Substance #2 Name of Substance 2: Alcohol 2 - Age of First Use: 30 yrs old 2 - Amount (size/oz): "3 spirits" 2 - Frequency: 3-4 times per week 2 - Duration: on-going 2 - Last Use / Amount: 1 week ago 2 - Method of Aquiring: unknown 2 - Route of Substance Use: oral                     ASAM's:  Six Dimensions of Multidimensional Assessment  Dimension 1:  Acute Intoxication and/or Withdrawal Potential:   Dimension 1:  Description of individual's past and current experiences of substance use and withdrawal: Patient states that he has not used in a year and has no current withdrawal symptoms  Dimension 2:  Biomedical Conditions and Complications:   Dimension 2:  Description of patient's biomedical conditions and  complications: Patient has 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 drug use  Dimension 3:  Emotional, Behavioral, or Cognitive Conditions and Complications:  Dimension 3:  Description of emotional, behavioral, or cognitive conditions and complications: Patient states that  he has a history of anxiety and depression and suicidal thoughts  Dimension 4:  Readiness to Change:  Dimension 4:  Description of Readiness to Change criteria: Patient states that he wants to get help and make positive changes in his life  Dimension 5:  Relapse, Continued use, or Continued  Problem Potential:  Dimension 5:  Relapse, continued use, or continued problem potential critiera description: Patient states that he has maintained abstinence fro illicit drugs over the past year  Dimension 6:  Recovery/Living Environment:  Dimension 6:  Recovery/Iiving environment criteria description: Patient states that he lives in a safe and supportive environment  ASAM Severity Score: ASAM's Severity Rating Score: 6  ASAM Recommended Level of Treatment: ASAM Recommended Level of Treatment:  (Patient denies any drug use in the past year and would not qualify for treatment at this time)   Substance use Disorder (SUD) Substance Use Disorder (SUD)  Checklist Symptoms of Substance Use: Continued use despite having a persistent/recurrent physical/psychological problem caused/exacerbated by use, Persistent desire or unsuccessful efforts to cut down or control use, Social, occupational, recreational activities given up or reduced due to use, Substance(s) often taken in larger amounts or over longer times than was intended  Recommendations for Services/Supports/Treatments: Recommendations for Services/Supports/Treatments Recommendations For Services/Supports/Treatments: Medication Management (Facility Based Crises)  Discharge Disposition:    DSM5 Diagnoses: Patient Active Problem List   Diagnosis Date Noted   MDD (major depressive disorder), recurrent severe, without psychosis (HCC) 02/08/2021   MDD (major depressive disorder), recurrent episode, severe (HCC) 11/30/2020   Major depressive disorder, recurrent severe without psychotic features (HCC)    Substance induced mood disorder (HCC) 11/22/2019      Referrals to Alternative Service(s): Referred to Alternative Service(s):   Place:   Date:   Time:    Referred to Alternative Service(s):   Place:   Date:   Time:    Referred to Alternative Service(s):   Place:   Date:   Time:    Referred to Alternative Service(s):   Place:   Date:   Time:     Alfredia Ferguson, LCAS

## 2021-03-14 NOTE — Progress Notes (Signed)
During shift patient was admitted to the [facility], did not attend any of the group meetings, but did get up to eat meals. Patient expresses SI, denies HI and AVH. No discharge plan available at this time, although resources were given to patient from Child psychotherapist. Patient did not complete admissions form today.

## 2021-03-14 NOTE — ED Notes (Signed)
Patient did not attend group due to him not wanting to get out of bed, even after encouragement from staff.

## 2021-03-14 NOTE — BH Assessment (Signed)
BHH Assessment Progress Note   Per Maxie Barb, NP, this pt would benefit from admission to the Facility Based Crisis unit at [facility] at this time.  Pt has been accepted  by Earlene Plater, MD.  Pt has signed Voluntary Admission and Consent for Treatment, as well as Consent to Release Information to no one, and signed forms have been faxed to [facility].  EDP Edwin Dada, DO and pt's nurse, Chrissie Noa, have been notified, and Chrissie Noa agrees to send original paperwork along with pt via [facility], and to call report to [phone].  Doylene Canning, [place] [facility] Coordinator [phone]

## 2021-03-14 NOTE — ED Provider Notes (Signed)
Emergency Medicine Observation Re-evaluation Note  Jared Jordan is a 30 y.o. male, seen on rounds today.  Pt initially presented to the ED for complaints of Suicidal Currently, the patient is resting comfortably in hallway bed. No current concerns.   Physical Exam  BP (!) 145/87 (BP Location: Left Arm)   Pulse (!) 58   Temp 97.6 F (36.4 C) (Oral)   Resp 16   Ht 5\' 10"  (1.778 m)   Wt 77.1 kg   SpO2 100%   BMI 24.39 kg/m  Physical Exam Vitals and nursing note reviewed.  Constitutional:      Appearance: Normal appearance.  Cardiovascular:     Rate and Rhythm: Normal rate and regular rhythm.  Pulmonary:     Effort: Pulmonary effort is normal.     Breath sounds: Normal breath sounds.  Neurological:     General: No focal deficit present.     Mental Status: He is alert.  Psychiatric:        Mood and Affect: Mood normal.     ED Course / MDM  EKG:   I have reviewed the labs performed to date as well as medications administered while in observation.  Recent changes in the last 24 hours include recommended for [facility]. Pt to sign waiver and transport via [facility].  Plan  Current plan is for Transfer to [facility].   Belford Pascucci is not under involuntary commitment.     Addison Lank P, DO 03/15/21 [phone]

## 2021-03-14 NOTE — ED Notes (Signed)
Belongings moved to 1-4 cabinets

## 2021-03-14 NOTE — ED Provider Notes (Signed)
Care assumed from previous provider PA Cyndia Skeeters. Please see note for further details.  In brief patient is a 30 year old male with a past medical history of MDD who presented with a complaint of suicidal ideations.  Patient recently lost his father and his job and was found walking [place] the side of the highway. Case discussed, plan agreed upon. Will clear to TTS if labwork is stable.   Physical Exam  BP (!) 145/87 (BP Location: Left Arm)   Pulse (!) 58   Temp 97.6 F (36.4 C) (Oral)   Resp 16   Ht 5\' 10"  (1.778 m)   Wt 77.1 kg   SpO2 100%   BMI 24.39 kg/m   MDM  Patient a 30 year old male with a past history of MDD presenting with a complaint of suicidal ideation.  Patient has been medically cleared and is ready for TTS evaluation.   Suicidal ideation    32, PA-C 03/14/21 0843    05/14/21, DO 03/15/21 [phone]

## 2021-03-14 NOTE — ED Provider Notes (Addendum)
[facility] Admission Suicide Risk Assessment   Nursing information obtained from:   chart Mental Status: reporting SI Demographic Factors:  Male, Caucasian, Low socioeconomic status, Living alone, and Unemployed Loss Factors: Decrease in vocational status, Loss of significant relationship, and Financial problems/change in socioeconomic status Historical Factors: Impulsivity Risk Reduction Factors:   Sense of responsibility to family    Total Time spent with patient: 30 minutes Principal Problem: <principal problem not specified> Diagnosis:  Active Problems:   MDD (major depressive disorder), recurrent episode (HCC)  Subjective Data:   Patient seen and chart reviewed. He is known to me from previous [facility] admission where he was discharged to [facility] on 8/16. On coordinating discharge, [name] staff reported to [facility] SW that patient had been aaccepted; however, upon arrival he was informed that he had been to the program within 90 days and was denied entrance to the program. He was dischared with effexor 75 mg, trazodone 50 mg and vistaril 25 mg TID PRN   Per chart, patient presented to [facility] ED with SI and reported that he was walking around in traffic. Pt affirms this information and states that after recent [facility] discharge he was declined from [facility] because it was "within 90 days". He states that he was 'ok at first" and stayed with a friend; however, he subsequently didn't make it to school in time and lost his housing. He cites homelessness as one of his biggest stressors and has been sleeping in shelters or on the street. He states that this is the first time he has been homeless. He reports that he was feeling suicidal and was a"walking [place]" and "walking onto traffic without realizing it". Patient indicates that he is unsure if this is because he truly wanted to end his life or if it was because "I had a lot on my mind". He states that since recent [facility] discharge his parents stopped  speaking to him as they believe he is using drugs. Patient denies drug or alcohol use since being discharged from the [facility] last month; UDS neg and etoh neg this encounter. He describes his mood is "sporadic" and rates it  0/10. (10 being the best). He reports SI with a plan to "walk off a bridge" as he Is "tired of this process". He reports hopelessness, insomnia. Denies HI/AVH. Patient identifies his being declined by [facility] as a barrier to his success after recent discharge and indicates that "I need to be around positive things" in ordered to be successful. Patient reports compliance with medications since discharge and is agreeable to restart medications.       Obtained from chart review, and edited as appropriate Past Psychiatric History: Previous Medication Trials: zoloft, effexor, Vistaril,  Previous Psychiatric Hospitalizations: yes per chart; has had several Previous Suicide Attempts: yes History of Violence: no Outpatient psychiatrist: denies   Social History: Marital Status: no Children: no Source of Income: as above Education: last year of college-bachelors in business admin Special Ed: n/a Housing Status: homeless History of phys/sexual abuse:no Easy access to gun: no   Substance Use (with emphasis over the last 12 months) Recreational Drugs: Denies recent use Use of Alcohol: denies Tobacco Use: cigarette 1-2 day Rehab History: denies H/O Complicated Withdrawal: n.a   Legal History: Past Charges/Incarcerations: no Pending charges: no   Family Psychiatric History: denies  Continued Clinical Symptoms:    The "Alcohol Use Disorders Identification Test", Guidelines for Use in Primary Care, Second Edition.  World Science writer [facility]). Score between 0-7:  no or low risk or alcohol related problems. Score between 8-15:  moderate risk of alcohol related problems. Score between 16-19:  high risk of alcohol related problems. Score 20 or above:  warrants further  diagnostic evaluation for alcohol dependence and treatment.   CLINICAL FACTORS:   Depression:   Anhedonia Hopelessness Impulsivity Insomnia Alcohol/Substance Abuse/Dependencies More than one psychiatric diagnosis   Musculoskeletal: Strength & Muscle Tone: within normal limits Gait & Station: normal Patient leans: N/A  Psychiatric Specialty Exam:  Presentation  General Appearance: Appropriate for Environment; Casual  Eye Contact:Fair  Speech:Clear and Coherent  Speech Volume:Normal  Handedness:Right   Mood and Affect  Mood:Dysphoric; Depressed  Affect:Appropriate; Congruent   Thought Process  Thought Processes:Coherent; Goal Directed; Linear  Descriptions of Associations:Intact  Orientation:Full (Time, Place and Person)  Thought Content:Logical; WDL  History of Schizophrenia/Schizoaffective disorder:No  Duration of Psychotic Symptoms:No data recorded Hallucinations:Hallucinations: None  Ideas of Reference:None  Suicidal Thoughts:Suicidal Thoughts: Yes, Active SI Active Intent and/or Plan: With Plan  Homicidal Thoughts:Homicidal Thoughts: No   Sensorium  Memory:Immediate Good; Recent Good; Remote Good  Judgment:Fair  Insight:Fair   Executive Functions  Concentration:Good  Attention Span:Good  Recall:Good  Fund of Knowledge:Good  Language:Good   Psychomotor Activity  Psychomotor Activity:Psychomotor Activity: Normal   Assets  Assets:Communication Skills; Desire for Improvement; Physical Health; Resilience   Sleep  Sleep:Sleep: Fair    Physical Exam: Physical Exam Constitutional:      Appearance: Normal appearance. He is normal weight.  HENT:     Head: Normocephalic and atraumatic.  Eyes:     Extraocular Movements: Extraocular movements intact.  Cardiovascular:     Rate and Rhythm: Normal rate and regular rhythm.     Heart sounds: Normal heart sounds.  Pulmonary:     Effort: Pulmonary effort is normal.     Breath  sounds: Normal breath sounds.  Neurological:     General: No focal deficit present.     Mental Status: He is alert.   Review of Systems  Constitutional:  Negative for chills and fever.  HENT:  Negative for hearing loss.   Eyes:  Negative for discharge and redness.  Respiratory:  Negative for cough.   Cardiovascular:  Negative for chest pain.  Gastrointestinal:  Negative for abdominal pain.  Musculoskeletal:  Negative for myalgias.  Neurological:  Negative for headaches.  Psychiatric/Behavioral:  Positive for depression and suicidal ideas. Negative for hallucinations and substance abuse. The patient has insomnia.   There were no vitals taken for this visit. There is no height or weight on file to calculate BMI.   COGNITIVE FEATURES THAT CONTRIBUTE TO RISK:  Polarized thinking    SUICIDE RISK:   Mild:  Suicidal ideation of limited frequency, intensity, duration, and specificity.  There are no identifiable plans, no associated intent, mild dysphoria and related symptoms, good self-control (both objective and subjective assessment), few other risk factors, and identifiable protective factors, including available and accessible social support.  PLAN OF CARE:   30 yo male with h/o MDD, substance use who presetned to [facility] ED for SI and was transferred to the [facility] for further treatment.  Labs reviewed- CBC, CMP unremarkable, covid neg. On interview upon arrival to the [facility], patient continues to report SI. He reports that he has been sober since discharge last month-UDS neg, etoh neg.  Recent TSH 0.276 (mildly low) but t3 and t4 wnl (11/2020). a1c 5.3 (11/2020) lipid panel unremarkable (11/2020).He describes feeling hopeless and cites homelessness as biggest stressor. He is unable  to contract for safety at this time. He reports compliance with effexor 75 mg since discharge-he is agreeable to restart medications.       MDD -restart effexor 75 mg daily for mood -PRN trazodone done for sleep    Anxiety -25 mg TID PRN vistaril    I certify that inpatient services furnished can reasonably be expected to improve the patient's condition.   I certify that inpatient services furnished can reasonably be expected to improve the patient's condition.   Estella Husk, MD 03/14/2021, 1:50 PM

## 2021-03-14 NOTE — ED Provider Notes (Signed)
Behavioral Health Admission H&P [facility] & OBS)  Date: 03/14/21 Patient Name: Jared Jordan MRN: [id number] Chief Complaint: No chief complaint on file.     Diagnoses:  Final diagnoses:  MDD (major depressive disorder), recurrent severe, without psychosis (HCC)    HPI:   Patient accepted as a transfer from [facility] ED to [facility] for further treatment. He is known to me from previous [facility] admission where he was discharged to [facility] on 8/16. On coordinating discharge, [name] staff reported to [facility] SW that patient had been aaccepted; however, upon arrival he was informed that he had been to the program within 90 days and was denied entrance to the program. He was dischared with effexor 75 mg, trazodone 50 mg and vistaril 25 mg TID PRN   Per chart, patient presented to [facility] ED with SI and reported that he was walking around in traffic. Pt affirms this information and states that after recent [facility] discharge he was declined from [facility] because it was "within 90 days". He states that he was 'ok at first" and stayed with a friend; however, he subsequently didn't make it to school in time and lost his housing. He cites homelessness as one of his biggest stressors and has been sleeping in shelters or on the street. He states that this is the first time he has been homeless. He reports that he was feeling suicidal and was a"walking across battleground" and "walking onto traffic without realizing it". Patient indicates that he is unsure if this is because he truly wanted to end his life or if it was because "I had a lot on my mind". He states that since recent [facility] discharge his parents stopped speaking to him as they believe he is using drugs. Patient denies drug or alcohol use since being discharged from the [facility] last month; UDS neg and etoh neg this encounter. He describes his mood is "sporadic" and rates it  0/10. (10 being the best). He reports SI with a plan to "walk off a bridge" as he Is "tired of this process". He  reports hopelessness, insomnia. Denies HI/AVH. Patient identifies his being declined by [facility] as a barrier to his success after recent discharge and indicates that "I need to be around positive things" in ordered to be successful. Patient reports compliance with medications since discharge and is agreeable to restart medications.       Obtained from chart review, and edited as appropriate Past Psychiatric History: Previous Medication Trials: zoloft, effexor, Vistaril,  Previous Psychiatric Hospitalizations: yes per chart; has had several Previous Suicide Attempts: yes History of Violence: no Outpatient psychiatrist: denies   Social History: Marital Status: no Children: no Source of Income: none Education: last year of college-bachelors in business admin Special Ed: n/a Housing Status: homeless History of phys/sexual abuse:no Easy access to gun: no   Substance Use (with emphasis over the last 12 months) Recreational Drugs: Denies recent use  Use of Alcohol: denies Tobacco Use: cigarette 1-2 day Rehab History: denies H/O Complicated Withdrawal: n.a   Legal History: Past Charges/Incarcerations: no Pending charges: no   Family Psychiatric History: denies  PHQ 2-9:  Flowsheet Row ED from 11/30/2020 in [facility] EMERGENCY DEPARTMENT  Thoughts that you would be better off dead, or of hurting yourself in some way Nearly every day  PHQ-9 Total Score 26       Flowsheet Row ED from 03/14/2021 in [facility] Most recent reading at 03/14/2021 12:20 PM ED from 03/14/2021 in  [facility] Most recent reading at 03/14/2021 10:20 AM ED from 03/06/2021 in [facility] Most recent reading at 03/06/2021  1:14 AM  C-SSRS RISK CATEGORY High Risk High Risk Error: Q3, 4, or 5 should not be populated when Q2 is No        Total Time spent with patient: 30 minutes  Musculoskeletal   Strength & Muscle Tone: within normal limits Gait & Station: normal Patient leans: N/A  Psychiatric Specialty Exam  Presentation General Appearance: Appropriate for Environment; Casual  Eye Contact:Fair  Speech:Clear and Coherent  Speech Volume:Normal  Handedness:Right   Mood and Affect  Mood:Dysphoric; Depressed  Affect:Appropriate; Congruent   Thought Process  Thought Processes:Coherent; Goal Directed; Linear  Descriptions of Associations:Intact  Orientation:Full (Time, Place and Person)  Thought Content:Logical; WDL  Diagnosis of Schizophrenia or Schizoaffective disorder in past: No   Hallucinations:Hallucinations: None  Ideas of Reference:None  Suicidal Thoughts:Suicidal Thoughts: Yes, Active SI Active Intent and/or Plan: With Plan  Homicidal Thoughts:Homicidal Thoughts: No   Sensorium  Memory:Immediate Good; Recent Good; Remote Good  Judgment:Fair  Insight:Fair   Executive Functions  Concentration:Good  Attention Span:Good  Recall:Good  Fund of Knowledge:Good  Language:Good   Psychomotor Activity  Psychomotor Activity:Psychomotor Activity: Normal   Assets  Assets:Communication Skills; Desire for Improvement; Physical Health; Resilience   Sleep  Sleep:Sleep: Fair   Nutritional Assessment (For OBS and [name] admissions only) Has the patient had a weight loss or gain of 10 pounds or more in the last 3 months?: No Has the patient had a decrease in food intake/or appetite?: No Does the patient have dental problems?: No Does the patient have eating habits or behaviors that may be indicators of an eating disorder including binging or inducing vomiting?: No Has the patient recently lost weight without trying?: 0 Has the patient been eating poorly because of a decreased appetite?: 0 Malnutrition Screening Tool Score: 0  See SRA for physical exam and ROS   Blood pressure (!) 145/98, pulse 98, temperature 98.1 F (36.7 C), temperature  source Oral, resp. rate 18, SpO2 98 %. There is no height or weight on file to calculate BMI.  Past Psychiatric History: SIMD, opioid use, MDD   Is the patient at risk to self? Yes  Has the patient been a risk to self in the past 6 months? Yes .    Has the patient been a risk to self within the distant past? Yes   Is the patient a risk to others? No   Has the patient been a risk to others in the past 6 months? No   Has the patient been a risk to others within the distant past? No   Past Medical History:  Past Medical History:  Diagnosis Date   Drug use     Past Surgical History:  Procedure Laterality Date   SHOULDER SURGERY     SHOULDER SURGERY Left 2020    Family History: No family history on file.  Social History:  Social History   Socioeconomic History   Marital status: Single    Spouse name: Not on file   Number of children: Not on file   Years of education: Not on file   Highest education level: Not on file  Occupational History   Not on file  Tobacco Use   Smoking status: Every Day    Packs/day: 0.25    Years: 1.00    Pack years: 0.25    Types: Cigarettes  Smokeless tobacco: Never  Vaping Use   Vaping Use: Some days   Start date: 11/04/2018   Devices: Jewel Menthol  Substance and Sexual Activity   Alcohol use: Yes    Alcohol/week: 4.0 - 6.0 standard drinks    Types: 4 - 6 Cans of beer per week    Comment: 2-3 times weekly   Drug use: Not Currently    Types: IV, Methamphetamines    Comment: using 1gm everyday heroin   Sexual activity: Yes  Other Topics Concern   Not on file  Social History Narrative   Not on file   Social Determinants of Health   Financial Resource Strain: Not on file  Food Insecurity: Not on file  Transportation Needs: Not on file  Physical Activity: Not on file  Stress: Not on file  Social Connections: Not on file  Intimate Partner Violence: Not on file    SDOH:  SDOH Screenings   Alcohol Screen: Low Risk    Last  Alcohol Screening Score (AUDIT): 7  Depression (PHQ2-9): Medium Risk   PHQ-2 Score: 26  Financial Resource Strain: Not on file  Food Insecurity: Not on file  Housing: Not on file  Physical Activity: Not on file  Social Connections: Not on file  Stress: Not on file  Tobacco Use: High Risk   Smoking Tobacco Use: Every Day   Smokeless Tobacco Use: Never  Transportation Needs: Not on file    Last Labs:  Admission on 03/14/2021, Discharged on 03/14/2021  Component Date Value Ref Range Status   SARS Coronavirus 2 by RT PCR 03/14/2021 NEGATIVE  NEGATIVE Final   Comment: (NOTE) SARS-CoV-2 target nucleic acids are NOT DETECTED.  The SARS-CoV-2 RNA is generally detectable in upper respiratory specimens during the acute phase of infection. The lowest concentration of SARS-CoV-2 viral copies this assay can detect is 138 copies/mL. A negative result does not preclude SARS-Cov-2 infection and should not be used as the sole basis for treatment or other patient management decisions. A negative result may occur with  improper specimen collection/handling, submission of specimen other than nasopharyngeal swab, presence of viral mutation(s) within the areas targeted by this assay, and inadequate number of viral copies(<138 copies/mL). A negative result must be combined with clinical observations, patient history, and epidemiological information. The expected result is Negative.  Fact Sheet for Patients:  BloggerCourse.com  Fact Sheet for Healthcare Providers:  SeriousBroker.it  This test is no                          t yet approved or cleared by the [place] FDA and  has been authorized for detection and/or diagnosis of SARS-CoV-2 by FDA under an Emergency Use Authorization (EUA). This EUA will remain  in effect (meaning this test can be used) for the duration of the COVID-19 declaration under Section 564(b)(1) of the Act,  21 U.S.C.section 360bbb-3(b)(1), unless the authorization is terminated  or revoked sooner.       Influenza A by PCR 03/14/2021 NEGATIVE  NEGATIVE Final   Influenza B by PCR 03/14/2021 NEGATIVE  NEGATIVE Final   Comment: (NOTE) The Xpert Xpress SARS-CoV-2/FLU/RSV plus assay is intended as an aid in the diagnosis of influenza from Nasopharyngeal swab specimens and should not be used as a sole basis for treatment. Nasal washings and aspirates are unacceptable for Xpert Xpress SARS-CoV-2/FLU/RSV testing.  Fact Sheet for Patients: BloggerCourse.com  Fact Sheet for Healthcare Providers: SeriousBroker.it  This test is not  yet approved or cleared by the [place] and has been authorized for detection and/or diagnosis of SARS-CoV-2 by FDA under an Emergency Use Authorization (EUA). This EUA will remain in effect (meaning this test can be used) for the duration of the COVID-19 declaration under Section 564(b)(1) of the Act, 21 U.S.C. section 360bbb-3(b)(1), unless the authorization is terminated or revoked.  Performed at [facility], [address], [place], [place] [address]    Sodium 03/14/2021 139  135 - 145 mmol/L Final   Potassium 03/14/2021 4.5  3.5 - 5.1 mmol/L Final   Chloride 03/14/2021 104  98 - 111 mmol/L Final   CO2 03/14/2021 24  22 - 32 mmol/L Final   Glucose, Bld 03/14/2021 77  70 - 99 mg/dL Final   Glucose reference range applies only to samples taken after fasting for at least 8 hours.   BUN 03/14/2021 13  6 - 20 mg/dL Final   Creatinine, Ser 03/14/2021 0.70  0.61 - 1.24 mg/dL Final   Calcium 60/45/4098 9.8  8.9 - 10.3 mg/dL Final   Total Protein 11/91/4782 7.6  6.5 - 8.1 g/dL Final   Albumin 95/62/1308 4.4  3.5 - 5.0 g/dL Final   AST 65/78/4696 54 (A) 15 - 41 U/L Final   ALT 03/14/2021 66 (A) 0 - 44 U/L Final   Alkaline Phosphatase 03/14/2021 78  38 - 126 U/L Final   Total Bilirubin  03/14/2021 0.4  0.3 - 1.2 mg/dL Final   GFR, Estimated 03/14/2021 >60  >60 mL/min Final   Comment: (NOTE) Calculated using the CKD-EPI Creatinine Equation (2021)    Anion gap 03/14/2021 11  5 - 15 Final   Performed at [facility], [address] [place], [place] [address]   Alcohol, Ethyl (B) 03/14/2021 <10  <10 mg/dL Final   Comment: (NOTE) Lowest detectable limit for serum alcohol is 10 mg/dL.  For medical purposes only. Performed at [facility], [address], [place], [place] [address]    Opiates 03/14/2021 NONE DETECTED  NONE DETECTED Final   Cocaine 03/14/2021 NONE DETECTED  NONE DETECTED Final   Benzodiazepines 03/14/2021 NONE DETECTED  NONE DETECTED Final   Amphetamines 03/14/2021 NONE DETECTED  NONE DETECTED Final   Tetrahydrocannabinol 03/14/2021 NONE DETECTED  NONE DETECTED Final   Barbiturates 03/14/2021 NONE DETECTED  NONE DETECTED Final   Comment: (NOTE) DRUG SCREEN FOR MEDICAL PURPOSES ONLY.  IF CONFIRMATION IS NEEDED FOR ANY PURPOSE, NOTIFY LAB WITHIN 5 DAYS.  LOWEST DETECTABLE LIMITS FOR URINE DRUG SCREEN Drug Class                     Cutoff (ng/mL) Amphetamine and metabolites    1000 Barbiturate and metabolites    200 Benzodiazepine                 200 Tricyclics and metabolites     300 Opiates and metabolites        300 Cocaine and metabolites        300 THC                            50 Performed at [facility], [address], [place], [place] [address]    WBC 03/14/2021 7.2  4.0 - 10.5 K/uL Final   RBC 03/14/2021 5.25  4.22 - 5.81 MIL/uL Final   Hemoglobin 03/14/2021 15.2  13.0 - 17.0 g/dL Final  HCT 03/14/2021 47.9  39.0 - 52.0 % Final   MCV 03/14/2021 91.2  80.0 - 100.0 fL Final   MCH 03/14/2021 29.0  26.0 - 34.0 pg Final   MCHC 03/14/2021 31.7  30.0 - 36.0 g/dL Final   RDW 40/98/1191 13.8  11.5 - 15.5 % Final   Platelets 03/14/2021 266  150 - 400 K/uL Final   nRBC 03/14/2021 0.0   0.0 - 0.2 % Final   Neutrophils Relative % 03/14/2021 67  % Final   Neutro Abs 03/14/2021 4.8  1.7 - 7.7 K/uL Final   Lymphocytes Relative 03/14/2021 21  % Final   Lymphs Abs 03/14/2021 1.5  0.7 - 4.0 K/uL Final   Monocytes Relative 03/14/2021 9  % Final   Monocytes Absolute 03/14/2021 0.6  0.1 - 1.0 K/uL Final   Eosinophils Relative 03/14/2021 2  % Final   Eosinophils Absolute 03/14/2021 0.1  0.0 - 0.5 K/uL Final   Basophils Relative 03/14/2021 0  % Final   Basophils Absolute 03/14/2021 0.0  0.0 - 0.1 K/uL Final   Immature Granulocytes 03/14/2021 1  % Final   Abs Immature Granulocytes 03/14/2021 0.08 (A) 0.00 - 0.07 K/uL Final   Performed at [facility] [place], [address], [place], [place] [address]   Acetaminophen (Tylenol), Serum 03/14/2021 <10 (A) 10 - 30 ug/mL Final   Comment: (NOTE) Therapeutic concentrations vary significantly. A range of 10-30 ug/mL  may be an effective concentration for many patients. However, some  are best treated at concentrations outside of this range. Acetaminophen concentrations >150 ug/mL at 4 hours after ingestion  and >50 ug/mL at 12 hours after ingestion are often associated with  toxic reactions.  Performed at [facility], [address] [place], [place] [address]    Salicylate Lvl 03/14/2021 <7.0 (A) 7.0 - 30.0 mg/dL Final   Performed at [facility], [address] [place], [place] [address]  Admission on 02/12/2021, Discharged on 02/18/2021  Component Date Value Ref Range Status   POC Amphetamine UR 02/12/2021 None Detected  NONE DETECTED (Cut Off Level 1000 ng/mL) Final   POC Secobarbital (BAR) 02/12/2021 None Detected  NONE DETECTED (Cut Off Level 300 ng/mL) Final   POC Buprenorphine (BUP) 02/12/2021 None Detected  NONE DETECTED (Cut Off Level 10 ng/mL) Final   POC Oxazepam (BZO) 02/12/2021 None Detected  NONE DETECTED (Cut Off Level 300 ng/mL) Final   POC Cocaine UR 02/12/2021 None  Detected  NONE DETECTED (Cut Off Level 300 ng/mL) Final   POC Methamphetamine UR 02/12/2021 None Detected  NONE DETECTED (Cut Off Level 1000 ng/mL) Final   POC Morphine 02/12/2021 Positive (A) NONE DETECTED (Cut Off Level 300 ng/mL) Final   POC Oxycodone UR 02/12/2021 None Detected  NONE DETECTED (Cut Off Level 100 ng/mL) Final   POC Methadone UR 02/12/2021 None Detected  NONE DETECTED (Cut Off Level 300 ng/mL) Final   POC Marijuana UR 02/12/2021 Positive (A) NONE DETECTED (Cut Off Level 50 ng/mL) Final  Admission on 02/12/2021, Discharged on 02/12/2021  Component Date Value Ref Range Status   SARS Coronavirus 2 by RT PCR 02/12/2021 NEGATIVE  NEGATIVE Final   Comment: (NOTE) SARS-CoV-2 target nucleic acids are NOT DETECTED.  The SARS-CoV-2 RNA is generally detectable in upper respiratory specimens during the acute phase of infection. The lowest concentration of SARS-CoV-2 viral copies this assay can detect is 138 copies/mL. A negative result does not preclude SARS-Cov-2 infection and should not be used as the sole  basis for treatment or other patient management decisions. A negative result may occur with  improper specimen collection/handling, submission of specimen other than nasopharyngeal swab, presence of viral mutation(s) within the areas targeted by this assay, and inadequate number of viral copies(<138 copies/mL). A negative result must be combined with clinical observations, patient history, and epidemiological information. The expected result is Negative.  Fact Sheet for Patients:  BloggerCourse.com  Fact Sheet for Healthcare Providers:  SeriousBroker.it  This test is no                          t yet approved or cleared by the [place] FDA and  has been authorized for detection and/or diagnosis of SARS-CoV-2 by FDA under an Emergency Use Authorization (EUA). This EUA will remain  in effect (meaning this test can be  used) for the duration of the COVID-19 declaration under Section 564(b)(1) of the Act, 21 U.S.C.section 360bbb-3(b)(1), unless the authorization is terminated  or revoked sooner.       Influenza A by PCR 02/12/2021 NEGATIVE  NEGATIVE Final   Influenza B by PCR 02/12/2021 NEGATIVE  NEGATIVE Final   Comment: (NOTE) The Xpert Xpress SARS-CoV-2/FLU/RSV plus assay is intended as an aid in the diagnosis of influenza from Nasopharyngeal swab specimens and should not be used as a sole basis for treatment. Nasal washings and aspirates are unacceptable for Xpert Xpress SARS-CoV-2/FLU/RSV testing.  Fact Sheet for Patients: BloggerCourse.com  Fact Sheet for Healthcare Providers: SeriousBroker.it  This test is not yet approved or cleared by the [place] FDA and has been authorized for detection and/or diagnosis of SARS-CoV-2 by FDA under an Emergency Use Authorization (EUA). This EUA will remain in effect (meaning this test can be used) for the duration of the COVID-19 declaration under Section 564(b)(1) of the Act, 21 U.S.C. section 360bbb-3(b)(1), unless the authorization is terminated or revoked.  Performed at [facility], [address], [place], [place] [address]    Sodium 02/12/2021 135  135 - 145 mmol/L Final   Potassium 02/12/2021 4.1  3.5 - 5.1 mmol/L Final   Chloride 02/12/2021 102  98 - 111 mmol/L Final   CO2 02/12/2021 28  22 - 32 mmol/L Final   Glucose, Bld 02/12/2021 92  70 - 99 mg/dL Final   Glucose reference range applies only to samples taken after fasting for at least 8 hours.   BUN 02/12/2021 13  6 - 20 mg/dL Final   Creatinine, Ser 02/12/2021 0.56 (A) 0.61 - 1.24 mg/dL Final   Calcium 35/36/1443 8.9  8.9 - 10.3 mg/dL Final   Total Protein 15/40/0867 6.7  6.5 - 8.1 g/dL Final   Albumin 61/95/0932 3.9  3.5 - 5.0 g/dL Final   AST 67/06/4579 19  15 - 41 U/L Final   ALT 02/12/2021 15  0 - 44 U/L  Final   Alkaline Phosphatase 02/12/2021 75  38 - 126 U/L Final   Total Bilirubin 02/12/2021 0.6  0.3 - 1.2 mg/dL Final   GFR, Estimated 02/12/2021 >60  >60 mL/min Final   Comment: (NOTE) Calculated using the CKD-EPI Creatinine Equation (2021)    Anion gap 02/12/2021 5  5 - 15 Final   Performed at [facility], [address], [place], [place] [address]   Alcohol, Ethyl (B) 02/12/2021 <10  <10 mg/dL Final   Comment: (NOTE) Lowest detectable limit for serum alcohol is 10 mg/dL.  For medical purposes only. Performed at [facility], [address], [place], [place] [address]    WBC 02/12/2021 7.1  4.0 - 10.5 K/uL Final   RBC 02/12/2021 4.68  4.22 - 5.81 MIL/uL Final   Hemoglobin 02/12/2021 13.4  13.0 - 17.0 g/dL Final   HCT 28/41/3244 40.7  39.0 - 52.0 % Final   MCV 02/12/2021 87.0  80.0 - 100.0 fL Final   MCH 02/12/2021 28.6  26.0 - 34.0 pg Final   MCHC 02/12/2021 32.9  30.0 - 36.0 g/dL Final   RDW 07/08/7251 13.0  11.5 - 15.5 % Final   Platelets 02/12/2021 263  150 - 400 K/uL Final   nRBC 02/12/2021 0.0  0.0 - 0.2 % Final   Neutrophils Relative % 02/12/2021 73  % Final   Neutro Abs 02/12/2021 5.2  1.7 - 7.7 K/uL Final   Lymphocytes Relative 02/12/2021 18  % Final   Lymphs Abs 02/12/2021 1.3  0.7 - 4.0 K/uL Final   Monocytes Relative 02/12/2021 5  % Final   Monocytes Absolute 02/12/2021 0.4  0.1 - 1.0 K/uL Final   Eosinophils Relative 02/12/2021 3  % Final   Eosinophils Absolute 02/12/2021 0.2  0.0 - 0.5 K/uL Final   Basophils Relative 02/12/2021 1  % Final   Basophils Absolute 02/12/2021 0.1  0.0 - 0.1 K/uL Final   Immature Granulocytes 02/12/2021 0  % Final   Abs Immature Granulocytes 02/12/2021 0.03  0.00 - 0.07 K/uL Final   Performed at [facility], [address], [place], [place] [address]   Salicylate Lvl 02/12/2021 <7.0 (A) 7.0 - 30.0 mg/dL Final   Performed at [facility], [address], [place], [place] [address]   Acetaminophen (Tylenol), Serum 02/12/2021 <10 (A) 10 - 30 ug/mL Final   Comment: (NOTE) Therapeutic concentrations vary significantly. A range of 10-30 ug/mL  may be an effective concentration for many patients. However, some  are best treated at concentrations outside of this range. Acetaminophen concentrations >150 ug/mL at 4 hours after ingestion  and >50 ug/mL at 12 hours after ingestion are often associated with  toxic reactions.  Performed at [facility], [address], [place], [place] [address]   Admission on 02/08/2021, Discharged on 02/10/2021  Component Date Value Ref Range Status   SARS Coronavirus 2 by RT PCR 02/08/2021 NEGATIVE  NEGATIVE Final   Comment: (NOTE) SARS-CoV-2 target nucleic acids are NOT DETECTED.  The SARS-CoV-2 RNA is generally detectable in upper respiratory specimens during the acute phase of infection. The lowest concentration of SARS-CoV-2 viral copies this assay can detect is 138 copies/mL. A negative result does not preclude SARS-Cov-2 infection and should not be used as the sole basis for treatment or other patient management decisions. A negative result may occur with  improper specimen collection/handling, submission of specimen other than nasopharyngeal swab, presence of viral mutation(s) within the areas targeted by this assay, and inadequate number of viral copies(<138 copies/mL). A negative result must be combined with clinical observations, patient history, and epidemiological information. The expected result is Negative.  Fact Sheet for Patients:  BloggerCourse.com  Fact Sheet for Healthcare Providers:  SeriousBroker.it  This test is no                          t yet approved or cleared by the [place] FDA and  has been authorized for detection and/or diagnosis of SARS-CoV-2 by FDA under an Emergency Use Authorization (EUA).  This  EUA will remain  in effect (meaning this test can be used) for the duration of the COVID-19 declaration under Section 564(b)(1) of the Act, 21 U.S.C.section 360bbb-3(b)(1), unless the authorization is terminated  or revoked sooner.       Influenza A by PCR 02/08/2021 NEGATIVE  NEGATIVE Final   Influenza B by PCR 02/08/2021 NEGATIVE  NEGATIVE Final   Comment: (NOTE) The Xpert Xpress SARS-CoV-2/FLU/RSV plus assay is intended as an aid in the diagnosis of influenza from Nasopharyngeal swab specimens and should not be used as a sole basis for treatment. Nasal washings and aspirates are unacceptable for Xpert Xpress SARS-CoV-2/FLU/RSV testing.  Fact Sheet for Patients: BloggerCourse.com  Fact Sheet for Healthcare Providers: SeriousBroker.it  This test is not yet approved or cleared by the [place] FDA and has been authorized for detection and/or diagnosis of SARS-CoV-2 by FDA under an Emergency Use Authorization (EUA). This EUA will remain in effect (meaning this test can be used) for the duration of the COVID-19 declaration under Section 564(b)(1) of the Act, 21 U.S.C. section 360bbb-3(b)(1), unless the authorization is terminated or revoked.  Performed at [facility], [address], [place], [place] [address]    WBC 02/08/2021 6.5  4.0 - 10.5 K/uL Final   RBC 02/08/2021 5.15  4.22 - 5.81 MIL/uL Final   Hemoglobin 02/08/2021 14.9  13.0 - 17.0 g/dL Final   HCT 62/13/0865 44.9  39.0 - 52.0 % Final   MCV 02/08/2021 87.2  80.0 - 100.0 fL Final   MCH 02/08/2021 28.9  26.0 - 34.0 pg Final   MCHC 02/08/2021 33.2  30.0 - 36.0 g/dL Final   RDW 78/46/9629 13.0  11.5 - 15.5 % Final   Platelets 02/08/2021 330  150 - 400 K/uL Final   nRBC 02/08/2021 0.0  0.0 - 0.2 % Final   Neutrophils Relative % 02/08/2021 69  % Final   Neutro Abs 02/08/2021 4.5  1.7 - 7.7 K/uL Final   Lymphocytes Relative 02/08/2021 20  % Final   Lymphs Abs  02/08/2021 1.3  0.7 - 4.0 K/uL Final   Monocytes Relative 02/08/2021 7  % Final   Monocytes Absolute 02/08/2021 0.4  0.1 - 1.0 K/uL Final   Eosinophils Relative 02/08/2021 3  % Final   Eosinophils Absolute 02/08/2021 0.2  0.0 - 0.5 K/uL Final   Basophils Relative 02/08/2021 0  % Final   Basophils Absolute 02/08/2021 0.0  0.0 - 0.1 K/uL Final   Immature Granulocytes 02/08/2021 1  % Final   Abs Immature Granulocytes 02/08/2021 0.05  0.00 - 0.07 K/uL Final   Performed at [facility], [address] [place], [place] [address]   Sodium 02/08/2021 137  135 - 145 mmol/L Final   Potassium 02/08/2021 4.4  3.5 - 5.1 mmol/L Final   Chloride 02/08/2021 101  98 - 111 mmol/L Final   CO2 02/08/2021 26  22 - 32 mmol/L Final   Glucose, Bld 02/08/2021 75  70 - 99 mg/dL Final   Glucose reference range applies only to samples taken after fasting for at least 8 hours.   BUN 02/08/2021 12  6 - 20 mg/dL Final   Creatinine, Ser 02/08/2021 0.83  0.61 - 1.24 mg/dL Final   Calcium 32/44/0102 9.9  8.9 - 10.3 mg/dL Final   Total Protein 72/53/6644 6.9  6.5 - 8.1 g/dL Final   Albumin 03/47/4259 4.0  3.5 - 5.0 g/dL Final   AST 56/38/7564 19  15 - 41  U/L Final   ALT 02/08/2021 19  0 - 44 U/L Final   Alkaline Phosphatase 02/08/2021 87  38 - 126 U/L Final   Total Bilirubin 02/08/2021 0.4  0.3 - 1.2 mg/dL Final   GFR, Estimated 02/08/2021 >60  >60 mL/min Final   Comment: (NOTE) Calculated using the CKD-EPI Creatinine Equation (2021)    Anion gap 02/08/2021 10  5 - 15 Final   Performed at [facility], [address] [place], [place] [address]   Alcohol, Ethyl (B) 02/08/2021 <10  <10 mg/dL Final   Comment: (NOTE) Lowest detectable limit for serum alcohol is 10 mg/dL.  For medical purposes only. Performed at [facility], [address], [place], [place] [address]    POC Amphetamine UR 02/08/2021 None Detected  NONE DETECTED (Cut Off Level 1000 ng/mL) Final   POC Secobarbital (BAR) 02/08/2021  None Detected  NONE DETECTED (Cut Off Level 300 ng/mL) Final   POC Buprenorphine (BUP) 02/08/2021 None Detected  NONE DETECTED (Cut Off Level 10 ng/mL) Final   POC Oxazepam (BZO) 02/08/2021 None Detected  NONE DETECTED (Cut Off Level 300 ng/mL) Final   POC Cocaine UR 02/08/2021 None Detected  NONE DETECTED (Cut Off Level 300 ng/mL) Final   POC Methamphetamine UR 02/08/2021 None Detected  NONE DETECTED (Cut Off Level 1000 ng/mL) Final   POC Morphine 02/08/2021 None Detected  NONE DETECTED (Cut Off Level 300 ng/mL) Final   POC Oxycodone UR 02/08/2021 None Detected  NONE DETECTED (Cut Off Level 100 ng/mL) Final   POC Methadone UR 02/08/2021 None Detected  NONE DETECTED (Cut Off Level 300 ng/mL) Final   POC Marijuana UR 02/08/2021 None Detected  NONE DETECTED (Cut Off Level 50 ng/mL) Final   SARSCOV2ONAVIRUS 2 AG 02/08/2021 NEGATIVE  NEGATIVE Final   Comment: (NOTE) SARS-CoV-2 antigen NOT DETECTED.   Negative results are presumptive.  Negative results do not preclude SARS-CoV-2 infection and should not be used as the sole basis for treatment or other patient management decisions, including infection  control decisions, particularly in the presence of clinical signs and  symptoms consistent with COVID-19, or in those who have been in contact with the virus.  Negative results must be combined with clinical observations, patient history, and epidemiological information. The expected result is Negative.  Fact Sheet for Patients: https://www.jennings-kim.com/  Fact Sheet for Healthcare Providers: https://alexander-rogers.biz/  This test is not yet approved or cleared by the [place] FDA and  has been authorized for detection and/or diagnosis of SARS-CoV-2 by FDA under an Emergency Use Authorization (EUA).  This EUA will remain in effect (meaning this test can be used) for the duration of  the COV                          ID-19 declaration under Section 564(b)(1) of  the Act, 21 U.S.C. section 360bbb-3(b)(1), unless the authorization is terminated or revoked sooner.     SARS Coronavirus 2 Ag 02/08/2021 Negative  Negative Final   SARSCOV2ONAVIRUS 2 AG 02/08/2021 NEGATIVE  NEGATIVE Final   Comment: (NOTE) SARS-CoV-2 antigen NOT DETECTED.   Negative results are presumptive.  Negative results do not preclude SARS-CoV-2 infection and should not be used as the sole basis for treatment or other patient management decisions, including infection  control decisions, particularly in the presence of clinical signs and  symptoms consistent with COVID-19, or in those who have been in contact with the virus.  Negative results must  be combined with clinical observations, patient history, and epidemiological information. The expected result is Negative.  Fact Sheet for Patients: https://www.jennings-kim.com/  Fact Sheet for Healthcare Providers: https://alexander-rogers.biz/  This test is not yet approved or cleared by the [place] FDA and  has been authorized for detection and/or diagnosis of SARS-CoV-2 by FDA under an Emergency Use Authorization (EUA).  This EUA will remain in effect (meaning this test can be used) for the duration of  the COV                          ID-19 declaration under Section 564(b)(1) of the Act, 21 U.S.C. section 360bbb-3(b)(1), unless the authorization is terminated or revoked sooner.    Admission on 11/30/2020, Discharged on 12/04/2020  Component Date Value Ref Range Status   Hepatitis B Surface Ag 12/01/2020 NON REACTIVE  NON REACTIVE Final   HCV Ab 12/01/2020 Reactive (A) NON REACTIVE Final   Comment: (NOTE) The CDC recommends that a Reactive HCV antibody result be followed up  with a HCV Nucleic Acid Amplification test.     Hep A IgM 12/01/2020 See Scanned report in [place] Link (A) NON REACTIVE Final   Performed at [facility]   Hep B C IgM 12/01/2020 NON REACTIVE  NON REACTIVE  Final   Performed at [facility], [address] [place], [place] [address]   Sodium 12/01/2020 137  135 - 145 mmol/L Final   Potassium 12/01/2020 4.8  3.5 - 5.1 mmol/L Final   Chloride 12/01/2020 102  98 - 111 mmol/L Final   CO2 12/01/2020 25  22 - 32 mmol/L Final   Glucose, Bld 12/01/2020 132 (A) 70 - 99 mg/dL Final   Glucose reference range applies only to samples taken after fasting for at least 8 hours.   BUN 12/01/2020 15  6 - 20 mg/dL Final   Creatinine, Ser 12/01/2020 0.80  0.61 - 1.24 mg/dL Final   Calcium 60/45/4098 9.7  8.9 - 10.3 mg/dL Final   Total Protein 11/91/4782 7.9  6.5 - 8.1 g/dL Final   Albumin 95/62/1308 4.5  3.5 - 5.0 g/dL Final   AST 65/78/4696 59 (A) 15 - 41 U/L Final   ALT 12/01/2020 93 (A) 0 - 44 U/L Final   Alkaline Phosphatase 12/01/2020 77  38 - 126 U/L Final   Total Bilirubin 12/01/2020 0.6  0.3 - 1.2 mg/dL Final   GFR, Estimated 12/01/2020 >60  >60 mL/min Final   Comment: (NOTE) Calculated using the CKD-EPI Creatinine Equation (2021)    Anion gap 12/01/2020 10  5 - 15 Final   Performed at [facility], [address] [place], [place] [address]   TSH 12/01/2020 0.276 (A) 0.350 - 4.500 uIU/mL Final   Comment: Performed by a 3rd Generation assay with a functional sensitivity of <=0.01 uIU/mL. Performed at [facility], [address], [place], [place] [address]    Free T4 12/03/2020 0.76  0.61 - 1.12 ng/dL Final   Comment: (NOTE) Biotin ingestion may interfere with free T4 tests. If the results are inconsistent with the TSH level, previous test results, or the clinical presentation, then consider biotin interference. If needed, order repeat testing after stopping biotin. Performed at [facility], [address] [place], [place] [address]    T3, Free 12/03/2020 2.5  2.0 - 4.4 pg/mL Final   Comment: (NOTE) Performed At: [facility] [address] [place], [place]  [id number] Jolene Schimke MD [id number]    Cholesterol 12/03/2020 174  0 - 200 mg/dL Final   Triglycerides 15/17/6160 84  <150 mg/dL Final   HDL 73/71/0626 61  >40 mg/dL Final   Total CHOL/HDL Ratio 12/03/2020 2.9  RATIO Final   VLDL 12/03/2020 17  0 - 40 mg/dL Final   LDL Cholesterol 12/03/2020 96  0 - 99 mg/dL Final   Comment:        Total Cholesterol/HDL:CHD Risk Coronary Heart Disease Risk Table                     Men   Women  1/2 Average Risk   3.4   3.3  Average Risk       5.0   4.4  2 X Average Risk   9.6   7.1  3 X Average Risk  23.4   11.0        Use the calculated Patient Ratio above and the CHD Risk Table to determine the patient's CHD Risk.        ATP III CLASSIFICATION (LDL):  <100     mg/dL   Optimal  948-546  mg/dL   Near or Above                    Optimal  130-159  mg/dL   Borderline  270-350  mg/dL   High  >093     mg/dL   Very High Performed at [facility], [address], [place], [place] [address]    HCV Quantitative 12/03/2020 68,900  >50 IU/mL Final   HCV Quantitative Log 12/03/2020 4.838  >1.70 log10 IU/mL Final   Test Information 12/03/2020 Comment   Final   Comment: (NOTE) The quantitative range of this assay is 15 IU/mL to 100 million IU/mL. Performed At: [facility] [address] [place], [place] [id number] Jolene Schimke MD [id number]    Total Protein 12/03/2020 7.1  6.5 - 8.1 g/dL Final   Albumin 85/27/7824 4.3  3.5 - 5.0 g/dL Final   AST 23/53/6144 49 (A) 15 - 41 U/L Final   ALT 12/03/2020 89 (A) 0 - 44 U/L Final   Alkaline Phosphatase 12/03/2020 68  38 - 126 U/L Final   Total Bilirubin 12/03/2020 0.5  0.3 - 1.2 mg/dL Final   Bilirubin, Direct 12/03/2020 <0.1  0.0 - 0.2 mg/dL Final   Indirect Bilirubin 12/03/2020 NOT CALCULATED  0.3 - 0.9 mg/dL Final   Performed at [facility], [address] [place], [place] [address]   Hgb A1c MFr Bld 12/03/2020 5.3  4.8 - 5.6 % Final   Comment: (NOTE)          Prediabetes: 5.7 - 6.4         Diabetes: >6.4         Glycemic control for adults with diabetes: <7.0    Mean Plasma Glucose 12/03/2020 105  mg/dL Final   Comment: (NOTE) Performed At: [facility] [address] [place], [place] [id number] Jolene Schimke MD [id number]    Hepatitis C Quantitation 12/01/2020 27,600  IU/mL Final   HCV log10 12/01/2020 4.441  log10 IU/mL Final   Test Information (HCV): 12/01/2020 Comment   Final   Comment: (NOTE) The quantitative range of this assay is 15 IU/mL to 100 million IU/mL.    Interpretation (HCV): 12/01/2020 Comment   Final   Comment: (NOTE) Positive HCV antibody screen with the presence of HCV RNA is  consistent with active infection. Performed At: [facility] [address] [place], [place] [id number] Jolene Schimke MD [id number]   Admission on 11/30/2020, Discharged on 11/30/2020  Component Date Value Ref Range Status   Sodium 11/30/2020 133 (A) 135 - 145 mmol/L Final   Potassium 11/30/2020 3.6  3.5 - 5.1 mmol/L Final   Chloride 11/30/2020 100  98 - 111 mmol/L Final   CO2 11/30/2020 24  22 - 32 mmol/L Final   Glucose, Bld 11/30/2020 106 (A) 70 - 99 mg/dL Final   Glucose reference range applies only to samples taken after fasting for at least 8 hours.   BUN 11/30/2020 13  6 - 20 mg/dL Final   Creatinine, Ser 11/30/2020 0.94  0.61 - 1.24 mg/dL Final   Calcium 95/62/1308 9.4  8.9 - 10.3 mg/dL Final   Total Protein 65/78/4696 6.4 (A) 6.5 - 8.1 g/dL Final   Albumin 29/52/8413 3.9  3.5 - 5.0 g/dL Final   AST 24/40/1027 34  15 - 41 U/L Final   ALT 11/30/2020 58 (A) 0 - 44 U/L Final   Alkaline Phosphatase 11/30/2020 64  38 - 126 U/L Final   Total Bilirubin 11/30/2020 0.5  0.3 - 1.2 mg/dL Final   GFR, Estimated 11/30/2020 >60  >60 mL/min Final   Comment: (NOTE) Calculated using the CKD-EPI Creatinine Equation (2021)    Anion gap 11/30/2020 9  5 - 15 Final   Performed at [facility], [address]  [place], [place] [address]   Alcohol, Ethyl (B) 11/30/2020 <10  <10 mg/dL Final   Comment: (NOTE) Lowest detectable limit for serum alcohol is 10 mg/dL.  For medical purposes only. Performed at [facility], [address] [place], [place] [address]    Salicylate Lvl 11/30/2020 <7.0 (A) 7.0 - 30.0 mg/dL Final   Performed at [facility], [address], [place], [place] [address]   Acetaminophen (Tylenol), Serum 11/30/2020 <10 (A) 10 - 30 ug/mL Final   Comment: (NOTE) Therapeutic concentrations vary significantly. A range of 10-30 ug/mL  may be an effective concentration for many patients. However, some  are best treated at concentrations outside of this range. Acetaminophen concentrations >150 ug/mL at 4 hours after ingestion  and >50 ug/mL at 12 hours after ingestion are often associated with  toxic reactions.  Performed at [facility], [address] [place], [place] [address]    WBC 11/30/2020 8.6  4.0 - 10.5 K/uL Final   RBC 11/30/2020 4.26  4.22 - 5.81 MIL/uL Final   Hemoglobin 11/30/2020 12.5 (A) 13.0 - 17.0 g/dL Final   HCT 56/43/3295 37.7 (A) 39.0 - 52.0 % Final   MCV 11/30/2020 88.5  80.0 - 100.0 fL Final   MCH 11/30/2020 29.3  26.0 - 34.0 pg Final   MCHC 11/30/2020 33.2  30.0 - 36.0 g/dL Final   RDW 18/84/1660 13.1  11.5 - 15.5 % Final   Platelets 11/30/2020 220  150 - 400 K/uL Final   nRBC 11/30/2020 0.0  0.0 - 0.2 % Final   Performed at [facility], [address], [place], [place] [address]   Opiates 11/30/2020 POSITIVE (A) NONE DETECTED Final   Cocaine 11/30/2020 NONE DETECTED  NONE DETECTED Final   Benzodiazepines 11/30/2020 NONE DETECTED  NONE DETECTED Final   Amphetamines 11/30/2020 NONE DETECTED  NONE DETECTED Final   Tetrahydrocannabinol 11/30/2020 NONE DETECTED  NONE DETECTED Final   Barbiturates 11/30/2020 NONE DETECTED  NONE DETECTED Final  Comment: (NOTE) DRUG SCREEN FOR MEDICAL PURPOSES ONLY.  IF CONFIRMATION IS NEEDED FOR ANY  PURPOSE, NOTIFY LAB WITHIN 5 DAYS.  LOWEST DETECTABLE LIMITS FOR URINE DRUG SCREEN Drug Class                     Cutoff (ng/mL) Amphetamine and metabolites    1000 Barbiturate and metabolites    200 Benzodiazepine                 200 Tricyclics and metabolites     300 Opiates and metabolites        300 Cocaine and metabolites        300 THC                            50 Performed at [facility], [address], [place], [place] [address]    SARS Coronavirus 2 by RT PCR 11/30/2020 NEGATIVE  NEGATIVE Final   Comment: (NOTE) SARS-CoV-2 target nucleic acids are NOT DETECTED.  The SARS-CoV-2 RNA is generally detectable in upper respiratory specimens during the acute phase of infection. The lowest concentration of SARS-CoV-2 viral copies this assay can detect is 138 copies/mL. A negative result does not preclude SARS-Cov-2 infection and should not be used as the sole basis for treatment or other patient management decisions. A negative result may occur with  improper specimen collection/handling, submission of specimen other than nasopharyngeal swab, presence of viral mutation(s) within the areas targeted by this assay, and inadequate number of viral copies(<138 copies/mL). A negative result must be combined with clinical observations, patient history, and epidemiological information. The expected result is Negative.  Fact Sheet for Patients:  BloggerCourse.com  Fact Sheet for Healthcare Providers:  SeriousBroker.it  This test is no                          t yet approved or cleared by the [place] FDA and  has been authorized for detection and/or diagnosis of SARS-CoV-2 by FDA under an Emergency Use Authorization (EUA). This EUA will remain  in effect (meaning this test can be used) for the duration of the COVID-19 declaration under Section 564(b)(1) of the Act, 21 U.S.C.section 360bbb-3(b)(1), unless the  authorization is terminated  or revoked sooner.       Influenza A by PCR 11/30/2020 NEGATIVE  NEGATIVE Final   Influenza B by PCR 11/30/2020 NEGATIVE  NEGATIVE Final   Comment: (NOTE) The Xpert Xpress SARS-CoV-2/FLU/RSV plus assay is intended as an aid in the diagnosis of influenza from Nasopharyngeal swab specimens and should not be used as a sole basis for treatment. Nasal washings and aspirates are unacceptable for Xpert Xpress SARS-CoV-2/FLU/RSV testing.  Fact Sheet for Patients: BloggerCourse.com  Fact Sheet for Healthcare Providers: SeriousBroker.it  This test is not yet approved or cleared by the [place] FDA and has been authorized for detection and/or diagnosis of SARS-CoV-2 by FDA under an Emergency Use Authorization (EUA). This EUA will remain in effect (meaning this test can be used) for the duration of the COVID-19 declaration under Section 564(b)(1) of the Act, 21 U.S.C. section 360bbb-3(b)(1), unless the authorization is terminated or revoked.  Performed at [facility], [address], [place], [place] [address]     Allergies: Patient has no known allergies.  PTA Medications: (Not in a hospital admission)   Medical Decision Making  30 yo male with  h/o MDD, substance use who presetned to [facility] ED for SI and was transferred to the [facility] for further treatment.  Labs reviewed- CBC, CMP unremarkable, covid neg. On interview upon arrival to the [facility], patient continues to report SI. He reports that he has been sober since discharge last month-UDS neg, etoh neg.  Recent TSH 0.276 (mildly low) but t3 and t4 wnl (11/2020). a1c 5.3 (11/2020) lipid panel unremarkable (11/2020).He describes feeling hopeless and cites homelessness as biggest stressor. He is unable to contract for safety at this time. He reports compliance with effexor 75 mg since discharge-he is agreeable to restart medications.       MDD -restart  effexor 75 mg daily for mood -PRN trazodone done for sleep   Anxiety -25 mg TID PRN vistaril     Dispo: ongoing- SW will assist. Possible shelter vs sober living housing. Will continue to explore options and patient preferences during stay Recommendations  Based on my evaluation the patient does not appear to have an emergency medical condition.  Estella Husk, MD 03/14/21  2:15 PM

## 2021-03-15 DIAGNOSIS — Z79899 Other long term (current) drug therapy: Secondary | ICD-10-CM | POA: Diagnosis not present

## 2021-03-15 DIAGNOSIS — Z5900 Homelessness unspecified: Secondary | ICD-10-CM | POA: Diagnosis not present

## 2021-03-15 DIAGNOSIS — R45851 Suicidal ideations: Secondary | ICD-10-CM | POA: Diagnosis not present

## 2021-03-15 DIAGNOSIS — F339 Major depressive disorder, recurrent, unspecified: Secondary | ICD-10-CM | POA: Diagnosis not present

## 2021-03-15 NOTE — ED Provider Notes (Signed)
Behavioral Health Progress Note  Date and Time: 03/15/2021 10:02 AM Name: Jared LankGiancarlo Quiroa MRN:  [id number]  Subjective:   Jared Jordan, 30 y.o., male patient seen face to face by this provider, chart reviewed and discussed with treatment team and Dr. Bronwen BettersLaubach on 03/15/21.  On evaluation Jared Jordan states he came in last night after wondering in traffic. States he is unsure if he wanted to kill himself during that time. On today's evaluation  patient is calm/cooperative, alert/oriented x 4.  He makes fleeting eye contact. His speech is clear, coherent, normal rate and tone. He endorses depression with congruent affect. He does not appear to be responding to internal/external stimuli. He denies auditory and visual hallucinations. Denies homicidal ideations..  Denies any concerns with appetite and reports decrease in sleep. Patient is tolerating medications without adverse reactions. He states he did not attend group yesterday. Discussed the importance of participating, states he will plan to attend today. Patient endorses suicidal ideation, with a plan to jump of a bridge, but states he isn't sure if he would do it. Reassurance, support, and encouragement provided. Patient's feelings and progress discussed.  Denies any health concerns at this time.   Diagnosis:  Final diagnoses:  MDD (major depressive disorder), recurrent severe, without psychosis (HCC)    Total Time spent with patient: 30 minutes  Past Psychiatric History: MDD, substance abuse  Past Medical History:  Past Medical History:  Diagnosis Date   Drug use     Past Surgical History:  Procedure Laterality Date   SHOULDER SURGERY     SHOULDER SURGERY Left 2020   Family History: No family history on file. Family Psychiatric  History: unknown Social History:  Social History   Substance and Sexual Activity  Alcohol Use Yes   Alcohol/week: 4.0 - 6.0 standard drinks   Types: 4 - 6 Cans of beer per week   Comment:  2-3 times weekly     Social History   Substance and Sexual Activity  Drug Use Not Currently   Types: IV, Methamphetamines   Comment: using 1gm everyday heroin    Social History   Socioeconomic History   Marital status: Single    Spouse name: Not on file   Number of children: Not on file   Years of education: Not on file   Highest education level: Not on file  Occupational History   Not on file  Tobacco Use   Smoking status: Every Day    Packs/day: 0.25    Years: 1.00    Pack years: 0.25    Types: Cigarettes   Smokeless tobacco: Never  Vaping Use   Vaping Use: Some days   Start date: 11/04/2018   Devices: Jewel Menthol  Substance and Sexual Activity   Alcohol use: Yes    Alcohol/week: 4.0 - 6.0 standard drinks    Types: 4 - 6 Cans of beer per week    Comment: 2-3 times weekly   Drug use: Not Currently    Types: IV, Methamphetamines    Comment: using 1gm everyday heroin   Sexual activity: Yes  Other Topics Concern   Not on file  Social History Narrative   Not on file   Social Determinants of Health   Financial Resource Strain: Not on file  Food Insecurity: Not on file  Transportation Needs: Not on file  Physical Activity: Not on file  Stress: Not on file  Social Connections: Not on file   SDOH:  SDOH Screenings   Alcohol Screen: Low  Risk    Last Alcohol Screening Score (AUDIT): 7  Depression (PHQ2-9): Medium Risk   PHQ-2 Score: 26  Financial Resource Strain: Not on file  Food Insecurity: Not on file  Housing: Not on file  Physical Activity: Not on file  Social Connections: Not on file  Stress: Not on file  Tobacco Use: High Risk   Smoking Tobacco Use: Every Day   Smokeless Tobacco Use: Never  Transportation Needs: Not on file   Additional Social History:      Sleep: Fair  Appetite:  Good  Current Medications:  Current Facility-Administered Medications  Medication Dose Route Frequency Provider Last Rate Last Admin   acetaminophen (TYLENOL)  tablet 650 mg  650 mg Oral Q6H PRN Estella Husk, MD       alum & mag hydroxide-simeth (MAALOX/MYLANTA) 200-200-20 MG/5ML suspension 30 mL  30 mL Oral Q4H PRN Estella Husk, MD       hydrOXYzine (ATARAX/VISTARIL) tablet 25 mg  25 mg Oral TID PRN Estella Husk, MD       magnesium hydroxide (MILK OF MAGNESIA) suspension 30 mL  30 mL Oral Daily PRN Estella Husk, MD       traZODone (DESYREL) tablet 50 mg  50 mg Oral QHS PRN Estella Husk, MD       venlafaxine XR (EFFEXOR-XR) 24 hr capsule 75 mg  75 mg Oral Q breakfast Estella Husk, MD       Current Outpatient Medications  Medication Sig Dispense Refill   hydrOXYzine (ATARAX/VISTARIL) 25 MG tablet Take 1 tablet (25 mg total) by mouth 3 (three) times daily as needed for anxiety. (Patient not taking: Reported on 03/14/2021) 90 tablet 1   traZODone (DESYREL) 50 MG tablet Take 1 tablet (50 mg total) by mouth at bedtime as needed for sleep. (Patient not taking: Reported on 03/14/2021) 30 tablet 1   venlafaxine XR (EFFEXOR-XR) 75 MG 24 hr capsule Take 1 capsule (75 mg total) by mouth daily with breakfast. (Patient not taking: Reported on 03/14/2021) 30 capsule 1    Labs  Lab Results:  Admission on 03/14/2021, Discharged on 03/14/2021  Component Date Value Ref Range Status   SARS Coronavirus 2 by RT PCR 03/14/2021 NEGATIVE  NEGATIVE Final   Comment: (NOTE) SARS-CoV-2 target nucleic acids are NOT DETECTED.  The SARS-CoV-2 RNA is generally detectable in upper respiratory specimens during the acute phase of infection. The lowest concentration of SARS-CoV-2 viral copies this assay can detect is 138 copies/mL. A negative result does not preclude SARS-Cov-2 infection and should not be used as the sole basis for treatment or other patient management decisions. A negative result may occur with  improper specimen collection/handling, submission of specimen other than nasopharyngeal swab, presence of viral mutation(s)  within the areas targeted by this assay, and inadequate number of viral copies(<138 copies/mL). A negative result must be combined with clinical observations, patient history, and epidemiological information. The expected result is Negative.  Fact Sheet for Patients:  BloggerCourse.com  Fact Sheet for Healthcare Providers:  SeriousBroker.it  This test is no                          t yet approved or cleared by the [place] FDA and  has been authorized for detection and/or diagnosis of SARS-CoV-2 by FDA under an Emergency Use Authorization (EUA). This EUA will remain  in effect (meaning this test can be used) for the duration of the COVID-19  declaration under Section 564(b)(1) of the Act, 21 U.S.C.section 360bbb-3(b)(1), unless the authorization is terminated  or revoked sooner.       Influenza A by PCR 03/14/2021 NEGATIVE  NEGATIVE Final   Influenza B by PCR 03/14/2021 NEGATIVE  NEGATIVE Final   Comment: (NOTE) The Xpert Xpress SARS-CoV-2/FLU/RSV plus assay is intended as an aid in the diagnosis of influenza from Nasopharyngeal swab specimens and should not be used as a sole basis for treatment. Nasal washings and aspirates are unacceptable for Xpert Xpress SARS-CoV-2/FLU/RSV testing.  Fact Sheet for Patients: BloggerCourse.com  Fact Sheet for Healthcare Providers: SeriousBroker.it  This test is not yet approved or cleared by the [place] FDA and has been authorized for detection and/or diagnosis of SARS-CoV-2 by FDA under an Emergency Use Authorization (EUA). This EUA will remain in effect (meaning this test can be used) for the duration of the COVID-19 declaration under Section 564(b)(1) of the Act, 21 U.S.C. section 360bbb-3(b)(1), unless the authorization is terminated or revoked.  Performed at [facility], [address], [place],  [place] [address]    Sodium 03/14/2021 139  135 - 145 mmol/L Final   Potassium 03/14/2021 4.5  3.5 - 5.1 mmol/L Final   Chloride 03/14/2021 104  98 - 111 mmol/L Final   CO2 03/14/2021 24  22 - 32 mmol/L Final   Glucose, Bld 03/14/2021 77  70 - 99 mg/dL Final   Glucose reference range applies only to samples taken after fasting for at least 8 hours.   BUN 03/14/2021 13  6 - 20 mg/dL Final   Creatinine, Ser 03/14/2021 0.70  0.61 - 1.24 mg/dL Final   Calcium 60/45/4098 9.8  8.9 - 10.3 mg/dL Final   Total Protein 11/91/4782 7.6  6.5 - 8.1 g/dL Final   Albumin 95/62/1308 4.4  3.5 - 5.0 g/dL Final   AST 65/78/4696 54 (A) 15 - 41 U/L Final   ALT 03/14/2021 66 (A) 0 - 44 U/L Final   Alkaline Phosphatase 03/14/2021 78  38 - 126 U/L Final   Total Bilirubin 03/14/2021 0.4  0.3 - 1.2 mg/dL Final   GFR, Estimated 03/14/2021 >60  >60 mL/min Final   Comment: (NOTE) Calculated using the CKD-EPI Creatinine Equation (2021)    Anion gap 03/14/2021 11  5 - 15 Final   Performed at [facility], [address], [place], [place] [address]   Alcohol, Ethyl (B) 03/14/2021 <10  <10 mg/dL Final   Comment: (NOTE) Lowest detectable limit for serum alcohol is 10 mg/dL.  For medical purposes only. Performed at [facility], [address], [place], [place] [address]    Opiates 03/14/2021 NONE DETECTED  NONE DETECTED Final   Cocaine 03/14/2021 NONE DETECTED  NONE DETECTED Final   Benzodiazepines 03/14/2021 NONE DETECTED  NONE DETECTED Final   Amphetamines 03/14/2021 NONE DETECTED  NONE DETECTED Final   Tetrahydrocannabinol 03/14/2021 NONE DETECTED  NONE DETECTED Final   Barbiturates 03/14/2021 NONE DETECTED  NONE DETECTED Final   Comment: (NOTE) DRUG SCREEN FOR MEDICAL PURPOSES ONLY.  IF CONFIRMATION IS NEEDED FOR ANY PURPOSE, NOTIFY LAB WITHIN 5 DAYS.  LOWEST DETECTABLE LIMITS FOR URINE DRUG SCREEN Drug Class                     Cutoff (ng/mL) Amphetamine and metabolites     1000 Barbiturate and metabolites    200 Benzodiazepine  200 Tricyclics and metabolites     300 Opiates and metabolites        300 Cocaine and metabolites        300 THC                            50 Performed at [facility], [address] [place], [place] [address]    WBC 03/14/2021 7.2  4.0 - 10.5 K/uL Final   RBC 03/14/2021 5.25  4.22 - 5.81 MIL/uL Final   Hemoglobin 03/14/2021 15.2  13.0 - 17.0 g/dL Final   HCT 27/51/7001 47.9  39.0 - 52.0 % Final   MCV 03/14/2021 91.2  80.0 - 100.0 fL Final   MCH 03/14/2021 29.0  26.0 - 34.0 pg Final   MCHC 03/14/2021 31.7  30.0 - 36.0 g/dL Final   RDW 74/94/4967 13.8  11.5 - 15.5 % Final   Platelets 03/14/2021 266  150 - 400 K/uL Final   nRBC 03/14/2021 0.0  0.0 - 0.2 % Final   Neutrophils Relative % 03/14/2021 67  % Final   Neutro Abs 03/14/2021 4.8  1.7 - 7.7 K/uL Final   Lymphocytes Relative 03/14/2021 21  % Final   Lymphs Abs 03/14/2021 1.5  0.7 - 4.0 K/uL Final   Monocytes Relative 03/14/2021 9  % Final   Monocytes Absolute 03/14/2021 0.6  0.1 - 1.0 K/uL Final   Eosinophils Relative 03/14/2021 2  % Final   Eosinophils Absolute 03/14/2021 0.1  0.0 - 0.5 K/uL Final   Basophils Relative 03/14/2021 0  % Final   Basophils Absolute 03/14/2021 0.0  0.0 - 0.1 K/uL Final   Immature Granulocytes 03/14/2021 1  % Final   Abs Immature Granulocytes 03/14/2021 0.08 (A) 0.00 - 0.07 K/uL Final   Performed at [facility], [address], [place], [place] [address]   Acetaminophen (Tylenol), Serum 03/14/2021 <10 (A) 10 - 30 ug/mL Final   Comment: (NOTE) Therapeutic concentrations vary significantly. A range of 10-30 ug/mL  may be an effective concentration for many patients. However, some  are best treated at concentrations outside of this range. Acetaminophen concentrations >150 ug/mL at 4 hours after ingestion  and >50 ug/mL at 12 hours after ingestion are often associated with  toxic  reactions.  Performed at [facility], [address], [place], [place] [address]    Salicylate Lvl 03/14/2021 <7.0 (A) 7.0 - 30.0 mg/dL Final   Performed at [facility], [address], [place], [place] [address]  Admission on 02/12/2021, Discharged on 02/18/2021  Component Date Value Ref Range Status   POC Amphetamine UR 02/12/2021 None Detected  NONE DETECTED (Cut Off Level 1000 ng/mL) Final   POC Secobarbital (BAR) 02/12/2021 None Detected  NONE DETECTED (Cut Off Level 300 ng/mL) Final   POC Buprenorphine (BUP) 02/12/2021 None Detected  NONE DETECTED (Cut Off Level 10 ng/mL) Final   POC Oxazepam (BZO) 02/12/2021 None Detected  NONE DETECTED (Cut Off Level 300 ng/mL) Final   POC Cocaine UR 02/12/2021 None Detected  NONE DETECTED (Cut Off Level 300 ng/mL) Final   POC Methamphetamine UR 02/12/2021 None Detected  NONE DETECTED (Cut Off Level 1000 ng/mL) Final   POC Morphine 02/12/2021 Positive (A) NONE DETECTED (Cut Off Level 300 ng/mL) Final   POC Oxycodone UR 02/12/2021 None Detected  NONE DETECTED (Cut Off Level 100 ng/mL) Final   POC Methadone UR 02/12/2021 None Detected  NONE  DETECTED (Cut Off Level 300 ng/mL) Final   POC Marijuana UR 02/12/2021 Positive (A) NONE DETECTED (Cut Off Level 50 ng/mL) Final  Admission on 02/12/2021, Discharged on 02/12/2021  Component Date Value Ref Range Status   SARS Coronavirus 2 by RT PCR 02/12/2021 NEGATIVE  NEGATIVE Final   Comment: (NOTE) SARS-CoV-2 target nucleic acids are NOT DETECTED.  The SARS-CoV-2 RNA is generally detectable in upper respiratory specimens during the acute phase of infection. The lowest concentration of SARS-CoV-2 viral copies this assay can detect is 138 copies/mL. A negative result does not preclude SARS-Cov-2 infection and should not be used as the sole basis for treatment or other patient management decisions. A negative result may occur with  improper specimen collection/handling,  submission of specimen other than nasopharyngeal swab, presence of viral mutation(s) within the areas targeted by this assay, and inadequate number of viral copies(<138 copies/mL). A negative result must be combined with clinical observations, patient history, and epidemiological information. The expected result is Negative.  Fact Sheet for Patients:  BloggerCourse.com  Fact Sheet for Healthcare Providers:  SeriousBroker.it  This test is no                          t yet approved or cleared by the [place] FDA and  has been authorized for detection and/or diagnosis of SARS-CoV-2 by FDA under an Emergency Use Authorization (EUA). This EUA will remain  in effect (meaning this test can be used) for the duration of the COVID-19 declaration under Section 564(b)(1) of the Act, 21 U.S.C.section 360bbb-3(b)(1), unless the authorization is terminated  or revoked sooner.       Influenza A by PCR 02/12/2021 NEGATIVE  NEGATIVE Final   Influenza B by PCR 02/12/2021 NEGATIVE  NEGATIVE Final   Comment: (NOTE) The Xpert Xpress SARS-CoV-2/FLU/RSV plus assay is intended as an aid in the diagnosis of influenza from Nasopharyngeal swab specimens and should not be used as a sole basis for treatment. Nasal washings and aspirates are unacceptable for Xpert Xpress SARS-CoV-2/FLU/RSV testing.  Fact Sheet for Patients: BloggerCourse.com  Fact Sheet for Healthcare Providers: SeriousBroker.it  This test is not yet approved or cleared by the [place] FDA and has been authorized for detection and/or diagnosis of SARS-CoV-2 by FDA under an Emergency Use Authorization (EUA). This EUA will remain in effect (meaning this test can be used) for the duration of the COVID-19 declaration under Section 564(b)(1) of the Act, 21 U.S.C. section 360bbb-3(b)(1), unless the authorization is terminated  or revoked.  Performed at [facility], [address], [place], [place] [address]    Sodium 02/12/2021 135  135 - 145 mmol/L Final   Potassium 02/12/2021 4.1  3.5 - 5.1 mmol/L Final   Chloride 02/12/2021 102  98 - 111 mmol/L Final   CO2 02/12/2021 28  22 - 32 mmol/L Final   Glucose, Bld 02/12/2021 92  70 - 99 mg/dL Final   Glucose reference range applies only to samples taken after fasting for at least 8 hours.   BUN 02/12/2021 13  6 - 20 mg/dL Final   Creatinine, Ser 02/12/2021 0.56 (A) 0.61 - 1.24 mg/dL Final   Calcium 19/14/7829 8.9  8.9 - 10.3 mg/dL Final   Total Protein 56/21/3086 6.7  6.5 - 8.1 g/dL Final   Albumin 57/84/6962 3.9  3.5 - 5.0 g/dL Final   AST 95/28/4132 19  15 - 41 U/L Final   ALT 02/12/2021 15  0 -  44 U/L Final   Alkaline Phosphatase 02/12/2021 75  38 - 126 U/L Final   Total Bilirubin 02/12/2021 0.6  0.3 - 1.2 mg/dL Final   GFR, Estimated 02/12/2021 >60  >60 mL/min Final   Comment: (NOTE) Calculated using the CKD-EPI Creatinine Equation (2021)    Anion gap 02/12/2021 5  5 - 15 Final   Performed at [facility], [address] [place], [place] [address]   Alcohol, Ethyl (B) 02/12/2021 <10  <10 mg/dL Final   Comment: (NOTE) Lowest detectable limit for serum alcohol is 10 mg/dL.  For medical purposes only. Performed at [facility], [address], [place], [place] [address]    WBC 02/12/2021 7.1  4.0 - 10.5 K/uL Final   RBC 02/12/2021 4.68  4.22 - 5.81 MIL/uL Final   Hemoglobin 02/12/2021 13.4  13.0 - 17.0 g/dL Final   HCT 41/32/4401 40.7  39.0 - 52.0 % Final   MCV 02/12/2021 87.0  80.0 - 100.0 fL Final   MCH 02/12/2021 28.6  26.0 - 34.0 pg Final   MCHC 02/12/2021 32.9  30.0 - 36.0 g/dL Final   RDW 02/72/5366 13.0  11.5 - 15.5 % Final   Platelets 02/12/2021 263  150 - 400 K/uL Final   nRBC 02/12/2021 0.0  0.0 - 0.2 % Final   Neutrophils Relative % 02/12/2021 73  % Final   Neutro Abs 02/12/2021  5.2  1.7 - 7.7 K/uL Final   Lymphocytes Relative 02/12/2021 18  % Final   Lymphs Abs 02/12/2021 1.3  0.7 - 4.0 K/uL Final   Monocytes Relative 02/12/2021 5  % Final   Monocytes Absolute 02/12/2021 0.4  0.1 - 1.0 K/uL Final   Eosinophils Relative 02/12/2021 3  % Final   Eosinophils Absolute 02/12/2021 0.2  0.0 - 0.5 K/uL Final   Basophils Relative 02/12/2021 1  % Final   Basophils Absolute 02/12/2021 0.1  0.0 - 0.1 K/uL Final   Immature Granulocytes 02/12/2021 0  % Final   Abs Immature Granulocytes 02/12/2021 0.03  0.00 - 0.07 K/uL Final   Performed at [facility], [address] [place], [place] [address]   Salicylate Lvl 02/12/2021 <7.0 (A) 7.0 - 30.0 mg/dL Final   Performed at [facility], [address] [place], [place] [address]   Acetaminophen (Tylenol), Serum 02/12/2021 <10 (A) 10 - 30 ug/mL Final   Comment: (NOTE) Therapeutic concentrations vary significantly. A range of 10-30 ug/mL  may be an effective concentration for many patients. However, some  are best treated at concentrations outside of this range. Acetaminophen concentrations >150 ug/mL at 4 hours after ingestion  and >50 ug/mL at 12 hours after ingestion are often associated with  toxic reactions.  Performed at [facility], [address], [place], [place] [address]   Admission on 02/08/2021, Discharged on 02/10/2021  Component Date Value Ref Range Status   SARS Coronavirus 2 by RT PCR 02/08/2021 NEGATIVE  NEGATIVE Final   Comment: (NOTE) SARS-CoV-2 target nucleic acids are NOT DETECTED.  The SARS-CoV-2 RNA is generally detectable in upper respiratory specimens during the acute phase of infection. The lowest concentration of SARS-CoV-2 viral copies this assay can detect is 138 copies/mL. A negative result does not preclude SARS-Cov-2 infection and should not be used as the sole basis for treatment or other patient management decisions. A negative  result may occur with  improper specimen collection/handling, submission of specimen other than nasopharyngeal swab, presence of viral  mutation(s) within the areas targeted by this assay, and inadequate number of viral copies(<138 copies/mL). A negative result must be combined with clinical observations, patient history, and epidemiological information. The expected result is Negative.  Fact Sheet for Patients:  BloggerCourse.com  Fact Sheet for Healthcare Providers:  SeriousBroker.it  This test is no                          t yet approved or cleared by the [place] FDA and  has been authorized for detection and/or diagnosis of SARS-CoV-2 by FDA under an Emergency Use Authorization (EUA). This EUA will remain  in effect (meaning this test can be used) for the duration of the COVID-19 declaration under Section 564(b)(1) of the Act, 21 U.S.C.section 360bbb-3(b)(1), unless the authorization is terminated  or revoked sooner.       Influenza A by PCR 02/08/2021 NEGATIVE  NEGATIVE Final   Influenza B by PCR 02/08/2021 NEGATIVE  NEGATIVE Final   Comment: (NOTE) The Xpert Xpress SARS-CoV-2/FLU/RSV plus assay is intended as an aid in the diagnosis of influenza from Nasopharyngeal swab specimens and should not be used as a sole basis for treatment. Nasal washings and aspirates are unacceptable for Xpert Xpress SARS-CoV-2/FLU/RSV testing.  Fact Sheet for Patients: BloggerCourse.com  Fact Sheet for Healthcare Providers: SeriousBroker.it  This test is not yet approved or cleared by the [place] FDA and has been authorized for detection and/or diagnosis of SARS-CoV-2 by FDA under an Emergency Use Authorization (EUA). This EUA will remain in effect (meaning this test can be used) for the duration of the COVID-19 declaration under Section 564(b)(1) of the Act, 21 U.S.C. section  360bbb-3(b)(1), unless the authorization is terminated or revoked.  Performed at [facility], [address] [place], [place] [address]    WBC 02/08/2021 6.5  4.0 - 10.5 K/uL Final   RBC 02/08/2021 5.15  4.22 - 5.81 MIL/uL Final   Hemoglobin 02/08/2021 14.9  13.0 - 17.0 g/dL Final   HCT 19/14/7829 44.9  39.0 - 52.0 % Final   MCV 02/08/2021 87.2  80.0 - 100.0 fL Final   MCH 02/08/2021 28.9  26.0 - 34.0 pg Final   MCHC 02/08/2021 33.2  30.0 - 36.0 g/dL Final   RDW 56/21/3086 13.0  11.5 - 15.5 % Final   Platelets 02/08/2021 330  150 - 400 K/uL Final   nRBC 02/08/2021 0.0  0.0 - 0.2 % Final   Neutrophils Relative % 02/08/2021 69  % Final   Neutro Abs 02/08/2021 4.5  1.7 - 7.7 K/uL Final   Lymphocytes Relative 02/08/2021 20  % Final   Lymphs Abs 02/08/2021 1.3  0.7 - 4.0 K/uL Final   Monocytes Relative 02/08/2021 7  % Final   Monocytes Absolute 02/08/2021 0.4  0.1 - 1.0 K/uL Final   Eosinophils Relative 02/08/2021 3  % Final   Eosinophils Absolute 02/08/2021 0.2  0.0 - 0.5 K/uL Final   Basophils Relative 02/08/2021 0  % Final   Basophils Absolute 02/08/2021 0.0  0.0 - 0.1 K/uL Final   Immature Granulocytes 02/08/2021 1  % Final   Abs Immature Granulocytes 02/08/2021 0.05  0.00 - 0.07 K/uL Final   Performed at [facility], [address] [place], [place] [address]   Sodium 02/08/2021 137  135 - 145 mmol/L Final   Potassium 02/08/2021 4.4  3.5 - 5.1 mmol/L Final   Chloride 02/08/2021 101  98 - 111 mmol/L  Final   CO2 02/08/2021 26  22 - 32 mmol/L Final   Glucose, Bld 02/08/2021 75  70 - 99 mg/dL Final   Glucose reference range applies only to samples taken after fasting for at least 8 hours.   BUN 02/08/2021 12  6 - 20 mg/dL Final   Creatinine, Ser 02/08/2021 0.83  0.61 - 1.24 mg/dL Final   Calcium 66/44/0347 9.9  8.9 - 10.3 mg/dL Final   Total Protein 42/59/5638 6.9  6.5 - 8.1 g/dL Final   Albumin 75/64/3329 4.0  3.5 - 5.0 g/dL Final   AST 51/88/4166 19  15 - 41 U/L  Final   ALT 02/08/2021 19  0 - 44 U/L Final   Alkaline Phosphatase 02/08/2021 87  38 - 126 U/L Final   Total Bilirubin 02/08/2021 0.4  0.3 - 1.2 mg/dL Final   GFR, Estimated 02/08/2021 >60  >60 mL/min Final   Comment: (NOTE) Calculated using the CKD-EPI Creatinine Equation (2021)    Anion gap 02/08/2021 10  5 - 15 Final   Performed at [facility], [address] [place], [place] [address]   Alcohol, Ethyl (B) 02/08/2021 <10  <10 mg/dL Final   Comment: (NOTE) Lowest detectable limit for serum alcohol is 10 mg/dL.  For medical purposes only. Performed at [facility], [address], [place], [place] [address]    POC Amphetamine UR 02/08/2021 None Detected  NONE DETECTED (Cut Off Level 1000 ng/mL) Final   POC Secobarbital (BAR) 02/08/2021 None Detected  NONE DETECTED (Cut Off Level 300 ng/mL) Final   POC Buprenorphine (BUP) 02/08/2021 None Detected  NONE DETECTED (Cut Off Level 10 ng/mL) Final   POC Oxazepam (BZO) 02/08/2021 None Detected  NONE DETECTED (Cut Off Level 300 ng/mL) Final   POC Cocaine UR 02/08/2021 None Detected  NONE DETECTED (Cut Off Level 300 ng/mL) Final   POC Methamphetamine UR 02/08/2021 None Detected  NONE DETECTED (Cut Off Level 1000 ng/mL) Final   POC Morphine 02/08/2021 None Detected  NONE DETECTED (Cut Off Level 300 ng/mL) Final   POC Oxycodone UR 02/08/2021 None Detected  NONE DETECTED (Cut Off Level 100 ng/mL) Final   POC Methadone UR 02/08/2021 None Detected  NONE DETECTED (Cut Off Level 300 ng/mL) Final   POC Marijuana UR 02/08/2021 None Detected  NONE DETECTED (Cut Off Level 50 ng/mL) Final   SARSCOV2ONAVIRUS 2 AG 02/08/2021 NEGATIVE  NEGATIVE Final   Comment: (NOTE) SARS-CoV-2 antigen NOT DETECTED.   Negative results are presumptive.  Negative results do not preclude SARS-CoV-2 infection and should not be used as the sole basis for treatment or other patient management decisions, including infection  control decisions, particularly in the  presence of clinical signs and  symptoms consistent with COVID-19, or in those who have been in contact with the virus.  Negative results must be combined with clinical observations, patient history, and epidemiological information. The expected result is Negative.  Fact Sheet for Patients: https://www.jennings-kim.com/  Fact Sheet for Healthcare Providers: https://alexander-rogers.biz/  This test is not yet approved or cleared by the [place] FDA and  has been authorized for detection and/or diagnosis of SARS-CoV-2 by FDA under an Emergency Use Authorization (EUA).  This EUA will remain in effect (meaning this test can be used) for the duration of  the COV                          ID-19 declaration under Section 564(b)(1) of the  Act, 21 U.S.C. section 360bbb-3(b)(1), unless the authorization is terminated or revoked sooner.     SARS Coronavirus 2 Ag 02/08/2021 Negative  Negative Final   SARSCOV2ONAVIRUS 2 AG 02/08/2021 NEGATIVE  NEGATIVE Final   Comment: (NOTE) SARS-CoV-2 antigen NOT DETECTED.   Negative results are presumptive.  Negative results do not preclude SARS-CoV-2 infection and should not be used as the sole basis for treatment or other patient management decisions, including infection  control decisions, particularly in the presence of clinical signs and  symptoms consistent with COVID-19, or in those who have been in contact with the virus.  Negative results must be combined with clinical observations, patient history, and epidemiological information. The expected result is Negative.  Fact Sheet for Patients: https://www.jennings-kim.com/  Fact Sheet for Healthcare Providers: https://alexander-rogers.biz/  This test is not yet approved or cleared by the [place] FDA and  has been authorized for detection and/or diagnosis of SARS-CoV-2 by FDA under an Emergency Use Authorization (EUA).  This EUA  will remain in effect (meaning this test can be used) for the duration of  the COV                          ID-19 declaration under Section 564(b)(1) of the Act, 21 U.S.C. section 360bbb-3(b)(1), unless the authorization is terminated or revoked sooner.    Admission on 11/30/2020, Discharged on 12/04/2020  Component Date Value Ref Range Status   Hepatitis B Surface Ag 12/01/2020 NON REACTIVE  NON REACTIVE Final   HCV Ab 12/01/2020 Reactive (A) NON REACTIVE Final   Comment: (NOTE) The CDC recommends that a Reactive HCV antibody result be followed up  with a HCV Nucleic Acid Amplification test.     Hep A IgM 12/01/2020 See Scanned report in [place] Link (A) NON REACTIVE Final   Performed at [facility]   Hep B C IgM 12/01/2020 NON REACTIVE  NON REACTIVE Final   Performed at [facility], [address], [place], [place] [address]   Sodium 12/01/2020 137  135 - 145 mmol/L Final   Potassium 12/01/2020 4.8  3.5 - 5.1 mmol/L Final   Chloride 12/01/2020 102  98 - 111 mmol/L Final   CO2 12/01/2020 25  22 - 32 mmol/L Final   Glucose, Bld 12/01/2020 132 (A) 70 - 99 mg/dL Final   Glucose reference range applies only to samples taken after fasting for at least 8 hours.   BUN 12/01/2020 15  6 - 20 mg/dL Final   Creatinine, Ser 12/01/2020 0.80  0.61 - 1.24 mg/dL Final   Calcium 19/14/7829 9.7  8.9 - 10.3 mg/dL Final   Total Protein 56/21/3086 7.9  6.5 - 8.1 g/dL Final   Albumin 57/84/6962 4.5  3.5 - 5.0 g/dL Final   AST 95/28/4132 59 (A) 15 - 41 U/L Final   ALT 12/01/2020 93 (A) 0 - 44 U/L Final   Alkaline Phosphatase 12/01/2020 77  38 - 126 U/L Final   Total Bilirubin 12/01/2020 0.6  0.3 - 1.2 mg/dL Final   GFR, Estimated 12/01/2020 >60  >60 mL/min Final   Comment: (NOTE) Calculated using the CKD-EPI Creatinine Equation (2021)    Anion gap 12/01/2020 10  5 - 15 Final   Performed at [facility], [address] [place], [place] [address]   TSH  12/01/2020 0.276 (A) 0.350 - 4.500 uIU/mL Final   Comment: Performed by a 3rd Generation assay with a functional sensitivity of <=  0.01 uIU/mL. Performed at [facility], [address] [place], [place] [address]    Free T4 12/03/2020 0.76  0.61 - 1.12 ng/dL Final   Comment: (NOTE) Biotin ingestion may interfere with free T4 tests. If the results are inconsistent with the TSH level, previous test results, or the clinical presentation, then consider biotin interference. If needed, order repeat testing after stopping biotin. Performed at [facility], [address] [place], [place] [address]    T3, Free 12/03/2020 2.5  2.0 - 4.4 pg/mL Final   Comment: (NOTE) Performed At: [facility] [address] [place], [place] [id number] Jolene Schimke MD WG:[id number]    Cholesterol 12/03/2020 174  0 - 200 mg/dL Final   Triglycerides 78/46/9629 84  <150 mg/dL Final   HDL 52/84/1324 61  >40 mg/dL Final   Total CHOL/HDL Ratio 12/03/2020 2.9  RATIO Final   VLDL 12/03/2020 17  0 - 40 mg/dL Final   LDL Cholesterol 12/03/2020 96  0 - 99 mg/dL Final   Comment:        Total Cholesterol/HDL:CHD Risk Coronary Heart Disease Risk Table                     Men   Women  1/2 Average Risk   3.4   3.3  Average Risk       5.0   4.4  2 X Average Risk   9.6   7.1  3 X Average Risk  23.4   11.0        Use the calculated Patient Ratio above and the CHD Risk Table to determine the patient's CHD Risk.        ATP III CLASSIFICATION (LDL):  <100     mg/dL   Optimal  401-027  mg/dL   Near or Above                    Optimal  130-159  mg/dL   Borderline  253-664  mg/dL   High  >403     mg/dL   Very High Performed at [facility], [address], [place], [place] [address]    HCV Quantitative 12/03/2020 68,900  >50 IU/mL Final   HCV Quantitative Log 12/03/2020 4.838  >1.70 log10 IU/mL Final   Test Information 12/03/2020 Comment   Final   Comment:  (NOTE) The quantitative range of this assay is 15 IU/mL to 100 million IU/mL. Performed At: [facility] [address] [place], [place] [id number] Jolene Schimke MD PP:[id number]    Total Protein 12/03/2020 7.1  6.5 - 8.1 g/dL Final   Albumin 01/03/1600 4.3  3.5 - 5.0 g/dL Final   AST 09/32/3557 49 (A) 15 - 41 U/L Final   ALT 12/03/2020 89 (A) 0 - 44 U/L Final   Alkaline Phosphatase 12/03/2020 68  38 - 126 U/L Final   Total Bilirubin 12/03/2020 0.5  0.3 - 1.2 mg/dL Final   Bilirubin, Direct 12/03/2020 <0.1  0.0 - 0.2 mg/dL Final   Indirect Bilirubin 12/03/2020 NOT CALCULATED  0.3 - 0.9 mg/dL Final   Performed at [facility], [address] [place], [place] [address]   Hgb A1c MFr Bld 12/03/2020 5.3  4.8 - 5.6 % Final   Comment: (NOTE)         Prediabetes: 5.7 - 6.4         Diabetes: >6.4         Glycemic control for  adults with diabetes: <7.0    Mean Plasma Glucose 12/03/2020 105  mg/dL Final   Comment: (NOTE) Performed At: [facility] [address] [place], [place] [id number] Jolene Schimke MD JX:[id number]    Hepatitis C Quantitation 12/01/2020 27,600  IU/mL Final   HCV log10 12/01/2020 4.441  log10 IU/mL Final   Test Information (HCV): 12/01/2020 Comment   Final   Comment: (NOTE) The quantitative range of this assay is 15 IU/mL to 100 million IU/mL.    Interpretation (HCV): 12/01/2020 Comment   Final   Comment: (NOTE) Positive HCV antibody screen with the presence of HCV RNA is consistent with active infection. Performed At: [facility] [address] [place], [place] [id number] Jolene Schimke MD ON:[id number]   Admission on 11/30/2020, Discharged on 11/30/2020  Component Date Value Ref Range Status   Sodium 11/30/2020 133 (A) 135 - 145 mmol/L Final   Potassium 11/30/2020 3.6  3.5 - 5.1 mmol/L Final   Chloride 11/30/2020 100  98 - 111 mmol/L Final   CO2 11/30/2020 24  22 - 32 mmol/L Final   Glucose, Bld 11/30/2020  106 (A) 70 - 99 mg/dL Final   Glucose reference range applies only to samples taken after fasting for at least 8 hours.   BUN 11/30/2020 13  6 - 20 mg/dL Final   Creatinine, Ser 11/30/2020 0.94  0.61 - 1.24 mg/dL Final   Calcium 44/07/270 9.4  8.9 - 10.3 mg/dL Final   Total Protein 53/66/4403 6.4 (A) 6.5 - 8.1 g/dL Final   Albumin 47/42/5956 3.9  3.5 - 5.0 g/dL Final   AST 38/75/6433 34  15 - 41 U/L Final   ALT 11/30/2020 58 (A) 0 - 44 U/L Final   Alkaline Phosphatase 11/30/2020 64  38 - 126 U/L Final   Total Bilirubin 11/30/2020 0.5  0.3 - 1.2 mg/dL Final   GFR, Estimated 11/30/2020 >60  >60 mL/min Final   Comment: (NOTE) Calculated using the CKD-EPI Creatinine Equation (2021)    Anion gap 11/30/2020 9  5 - 15 Final   Performed at [facility], [address], [place], [place] [address]   Alcohol, Ethyl (B) 11/30/2020 <10  <10 mg/dL Final   Comment: (NOTE) Lowest detectable limit for serum alcohol is 10 mg/dL.  For medical purposes only. Performed at [facility], [address], [place], [place] [address]    Salicylate Lvl 11/30/2020 <7.0 (A) 7.0 - 30.0 mg/dL Final   Performed at [facility], [address], [place], [place] [address]   Acetaminophen (Tylenol), Serum 11/30/2020 <10 (A) 10 - 30 ug/mL Final   Comment: (NOTE) Therapeutic concentrations vary significantly. A range of 10-30 ug/mL  may be an effective concentration for many patients. However, some  are best treated at concentrations outside of this range. Acetaminophen concentrations >150 ug/mL at 4 hours after ingestion  and >50 ug/mL at 12 hours after ingestion are often associated with  toxic reactions.  Performed at [facility], [address], [place], [place] [address]    WBC 11/30/2020 8.6  4.0 - 10.5 K/uL Final   RBC 11/30/2020 4.26  4.22 - 5.81 MIL/uL Final   Hemoglobin 11/30/2020 12.5 (A) 13.0 - 17.0 g/dL Final   HCT 32/35/5732 37.7 (A) 39.0 - 52.0 % Final   MCV 11/30/2020  88.5  80.0 - 100.0 fL Final   MCH 11/30/2020 29.3  26.0 - 34.0 pg Final   MCHC 11/30/2020 33.2  30.0 - 36.0 g/dL  Final   RDW 11/30/2020 13.1  11.5 - 15.5 % Final   Platelets 11/30/2020 220  150 - 400 K/uL Final   nRBC 11/30/2020 0.0  0.0 - 0.2 % Final   Performed at [facility], [address] [place], [place] [address]   Opiates 11/30/2020 POSITIVE (A) NONE DETECTED Final   Cocaine 11/30/2020 NONE DETECTED  NONE DETECTED Final   Benzodiazepines 11/30/2020 NONE DETECTED  NONE DETECTED Final   Amphetamines 11/30/2020 NONE DETECTED  NONE DETECTED Final   Tetrahydrocannabinol 11/30/2020 NONE DETECTED  NONE DETECTED Final   Barbiturates 11/30/2020 NONE DETECTED  NONE DETECTED Final   Comment: (NOTE) DRUG SCREEN FOR MEDICAL PURPOSES ONLY.  IF CONFIRMATION IS NEEDED FOR ANY PURPOSE, NOTIFY LAB WITHIN 5 DAYS.  LOWEST DETECTABLE LIMITS FOR URINE DRUG SCREEN Drug Class                     Cutoff (ng/mL) Amphetamine and metabolites    1000 Barbiturate and metabolites    200 Benzodiazepine                 200 Tricyclics and metabolites     300 Opiates and metabolites        300 Cocaine and metabolites        300 THC                            50 Performed at [facility], [address], [place], [place] [address]    SARS Coronavirus 2 by RT PCR 11/30/2020 NEGATIVE  NEGATIVE Final   Comment: (NOTE) SARS-CoV-2 target nucleic acids are NOT DETECTED.  The SARS-CoV-2 RNA is generally detectable in upper respiratory specimens during the acute phase of infection. The lowest concentration of SARS-CoV-2 viral copies this assay can detect is 138 copies/mL. A negative result does not preclude SARS-Cov-2 infection and should not be used as the sole basis for treatment or other patient management decisions. A negative result may occur with  improper specimen collection/handling, submission of specimen other than nasopharyngeal swab, presence of viral mutation(s) within the areas  targeted by this assay, and inadequate number of viral copies(<138 copies/mL). A negative result must be combined with clinical observations, patient history, and epidemiological information. The expected result is Negative.  Fact Sheet for Patients:  BloggerCourse.com  Fact Sheet for Healthcare Providers:  SeriousBroker.it  This test is no                          t yet approved or cleared by the [place] FDA and  has been authorized for detection and/or diagnosis of SARS-CoV-2 by FDA under an Emergency Use Authorization (EUA). This EUA will remain  in effect (meaning this test can be used) for the duration of the COVID-19 declaration under Section 564(b)(1) of the Act, 21 U.S.C.section 360bbb-3(b)(1), unless the authorization is terminated  or revoked sooner.       Influenza A by PCR 11/30/2020 NEGATIVE  NEGATIVE Final   Influenza B by PCR 11/30/2020 NEGATIVE  NEGATIVE Final   Comment: (NOTE) The Xpert Xpress SARS-CoV-2/FLU/RSV plus assay is intended as an aid in the diagnosis of influenza from Nasopharyngeal swab specimens and should not be used as a sole basis for treatment. Nasal washings and aspirates are unacceptable for Xpert Xpress SARS-CoV-2/FLU/RSV testing.  Fact Sheet for Patients: BloggerCourse.com  Fact Sheet for Healthcare Providers: SeriousBroker.it  This test is not yet approved or cleared by the [place] and has been authorized for detection and/or diagnosis of SARS-CoV-2 by FDA under an Emergency Use Authorization (EUA). This EUA will remain in effect (meaning this test can be used) for the duration of the COVID-19 declaration under Section 564(b)(1) of the Act, 21 U.S.C. section 360bbb-3(b)(1), unless the authorization is terminated or revoked.  Performed at [facility], [address] [place], [place] [address]     Blood Alcohol  level:  Lab Results  Component Value Date   ETH <10 03/14/2021   ETH <10 02/12/2021    Metabolic Disorder Labs: Lab Results  Component Value Date   HGBA1C 5.3 12/03/2020   MPG 105 12/03/2020   MPG 108.28 11/23/2019   No results found for: PROLACTIN Lab Results  Component Value Date   CHOL 174 12/03/2020   TRIG 84 12/03/2020   HDL 61 12/03/2020   CHOLHDL 2.9 12/03/2020   VLDL 17 12/03/2020   LDLCALC 96 12/03/2020   LDLCALC 116 (H) 11/23/2019    Therapeutic Lab Levels: No results found for: LITHIUM No results found for: VALPROATE No components found for:  CBMZ  Physical Findings   AIMS    Flowsheet Row [facility]  AIMS Total Score 0 0      AUDIT    Flowsheet Row [facility] Ad[facility]  Alcohol Use Disorder Identification Test Final Score (AUDIT) 7 8      PHQ2-9    Flowsheet Row ED from 11/30/2020 in [facility] EM[facility]PART[facility]  PHQ-2 Total Score 5  PHQ-9 Total Score 26      Flowsheet Row ED from 03/14/2021 in [facility] Most recent reading at 03/14/2021 12:20 PM ED from 03/14/2021 in [facility] Most recent reading at 03/14/2021 10:20 AM ED from 03/06/2021 in [facility] Most recent reading at 03/06/2021  1:14 AM  C-SSRS RISK CATEGORY High Risk High Risk Error: Q3, 4, or 5 should not be populated when Q2 is No        Musculoskeletal  Strength & Muscle Tone: within normal limits Gait & Station: normal Patient leans: N/A  Psychiatric Specialty Exam  Presentation  General Appearance: Appropriate for Environment; Fairly Groomed  Eye  Contact:Fleeting  Speech:Clear and Coherent; Normal Rate  Speech Volume:Normal  Handedness:Right   Mood and Affect  Mood:Depressed  Affect:Congruent   Thought Process  Thought Processes:Coherent  Descriptions of Associations:Intact  Orientation:None  Thought Content:Logical  Diagnosis of Schizophrenia or Schizoaffective disorder in past: No    Hallucinations:Hallucinations: None  Ideas of Reference:None  Suicidal Thoughts:Suicidal Thoughts: Yes, Active SI Active Intent and/or Plan: With Plan; With Access to Means; Without Intent  Homicidal Thoughts:Homicidal Thoughts: No   Sensorium  Memory:Immediate Good; Recent Good; Remote Good  Judgment:Fair  Insight:Fair   Executive Functions  Concentration:Good  Attention Span:Good  Recall:Good  Fund of Knowledge:Good  Language:Good   Psychomotor Activity  Psychomotor Activity:Psychomotor Activity: Normal   Assets  Assets:Communication Skills; Desire for Improvement; Leisure Time; Physical Health; Resilience   Sleep  Sleep:Sleep: Fair   Nutritional Assessment (For OBS and [name] admissions only) Has the patient had a weight loss or gain of 10 pounds or more in  the last 3 months?: No Has the patient had a decrease in food intake/or appetite?: No Does the patient have dental problems?: No Does the patient have eating habits or behaviors that may be indicators of an eating disorder including binging or inducing vomiting?: No Has the patient recently lost weight without trying?: 0 Has the patient been eating poorly because of a decreased appetite?: 0 Malnutrition Screening Tool Score: 0    Physical Exam  Physical Exam Vitals and nursing note reviewed.  Constitutional:      General: He is not in acute distress.    Appearance: He is well-developed and normal weight. He is not ill-appearing.  HENT:     Head: Normocephalic and atraumatic.  Eyes:     General:        Right eye: No discharge.         Left eye: No discharge.     Conjunctiva/sclera: Conjunctivae normal.  Cardiovascular:     Rate and Rhythm: Normal rate.     Heart sounds: No murmur heard. Pulmonary:     Effort: Pulmonary effort is normal. No respiratory distress.  Musculoskeletal:        General: Normal range of motion.     Cervical back: Neck supple.  Skin:    Coloration: Skin is not jaundiced or pale.  Neurological:     Mental Status: He is alert and oriented to person, place, and time.  Psychiatric:        Attention and Perception: Attention and perception normal.        Mood and Affect: Mood is depressed.        Speech: Speech normal.        Behavior: Behavior normal. Behavior is cooperative.        Thought Content: Thought content includes suicidal ideation. Thought content includes suicidal plan.        Cognition and Memory: Cognition normal.        Judgment: Judgment is impulsive.   Review of Systems  Constitutional: Negative.  Negative for chills and fever.  HENT: Negative.  Negative for hearing loss.   Eyes: Negative.   Respiratory: Negative.    Cardiovascular: Negative.  Negative for chest pain.  Musculoskeletal: Negative.   Skin: Negative.   Neurological: Negative.   Psychiatric/Behavioral:  Positive for depression and suicidal ideas.   Blood pressure (!) 147/98, pulse 95, temperature 97.7 F (36.5 C), temperature source Tympanic, resp. rate 18, SpO2 100 %. There is no height or weight on file to calculate BMI.  Treatment Plan Summary: Daily contact with patient to assess and evaluate symptoms and progress in treatment and Medication management.   Patient continues to meet criteria for treatment in [facility]. Reviewed lab work and medications. UDS negative Etoh negative. No medication changes at this time.   Ardis Hughs, NP 03/15/2021 10:02 AM

## 2021-03-15 NOTE — ED Notes (Signed)
Pt given breakfast.

## 2021-03-15 NOTE — Progress Notes (Signed)
Pt is watching TV currently. Pt did not voice complaint of pain or discomfort. No distress noted. Pt went to [facility] group therapy and slept most of the shift. Pt's safety is maintained.

## 2021-03-15 NOTE — Progress Notes (Signed)
Pt is alert and oriented. Pt is currently resting quietly. Pt did not voice any complaints of pain or discomfort. No distress noted. Administered scheduled medication with no incident. Pt endorses current SI with plan to jump off a bridge. Pt verbally contracts for safety on the unit. Pt denies HI/AVH at this time. Staff will monitor for pt's safety.

## 2021-03-15 NOTE — ED Notes (Signed)
Pt given lunch

## 2021-03-15 NOTE — ED Notes (Signed)
Pt sleeping at present, no distress noted.  Monitoring for safety. 

## 2021-03-15 NOTE — Group Note (Signed)
Group Topic: Social Support  Group Date: 03/15/2021 Start Time: 1330 End Time: 1400 Facilitators: Loma Newton  Department: [facility]  Number of Participants: 3  Group Focus: other social support Treatment Modality:  Patient-Centered Therapy Interventions utilized were group exercise Purpose: identify and improve social support systems  Name: Acelin Ferdig Date of Birth: [birth year]  MR: [id number]    Level of Participation: minimal Quality of Participation: isolative Interactions with others: minimal interactions, isolative but cooperative with encouragement. Mood/Affect: tearful Triggers (if applicable): discussing lack of support system. 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

## 2021-03-15 NOTE — Progress Notes (Signed)
Pt is asleep. Respirations are even and unlabored. No distress noted. Safety is maintained.    

## 2021-03-15 NOTE — Progress Notes (Signed)
Pt is asleep. Respirations are even and unlabored. No signs of acute distress noted. Staff will monitor for pt's safety. 

## 2021-03-16 DIAGNOSIS — Z5900 Homelessness unspecified: Secondary | ICD-10-CM | POA: Diagnosis not present

## 2021-03-16 DIAGNOSIS — R45851 Suicidal ideations: Secondary | ICD-10-CM | POA: Diagnosis not present

## 2021-03-16 DIAGNOSIS — F339 Major depressive disorder, recurrent, unspecified: Secondary | ICD-10-CM | POA: Diagnosis not present

## 2021-03-16 DIAGNOSIS — Z79899 Other long term (current) drug therapy: Secondary | ICD-10-CM | POA: Diagnosis not present

## 2021-03-16 NOTE — ED Provider Notes (Signed)
Behavioral Health Progress Note  Date and Time: 03/16/2021 12:31 PM Name: Jared Jordan MRN:  [id number]  Subjective:  Jared Jordan, 30 y.o., male patient seen face to face by this provider, chart reviewed and discussed with treatment team and Dr. [name] on 03/16/21.   On evaluation Jared Jordan is laying in his bed asleep.  He is easily awakened.  He sat up in bed during assessment.  He is alert and oriented x4 and cooperative.  He makes good eye contact.  Speech is clear, coherent, normal rate and tone.  He continues to endorse depression with congruent affect. Denies any concerns with appetite.  Reports he only slept around 5 hours and his sleep was broken due to noise.  He does not appear to be responding to internal/external stimuli.  Denies auditory and visual hallucinations.  Denies homicidal ideations.  Continues to endorse suicidal ideations with a plan to jump off a bridge.  States at this time he is not sure if he can follow through with it.  Patient is vague when discussing intent for suicide.  Patient discussed and focused on his homeless situation.  States he no longer talks to his family or his mother.  States this is causing him distress as well.  He continues to tolerate medications without adverse reactions.  He attended group session yesterday.  Discussed importance of participation.  When discussing plans upon discharge he reports he is not interested in any other programs that social worker provided to him.  States he has heard of these programs l and it does not look like programs he would be interested in.  Reports "I just want to get my life back on track".  States he will discuss his concerns with Child psychotherapist in the a.m.   Patient's feelings and progress discussed. Reassurance, support, and encouragement provided.   Diagnosis:  Final diagnoses:  MDD (major depressive disorder), recurrent severe, without psychosis (HCC)    Total Time spent with patient: 20  minutes  Past Psychiatric History: MDD,/substance abuse Past Medical History:  Past Medical History:  Diagnosis Date   Drug use     Past Surgical History:  Procedure Laterality Date   SHOULDER SURGERY     SHOULDER SURGERY Left 2020   Family History: No family history on file. Family Psychiatric  History: Unknown Social History:  Social History   Substance and Sexual Activity  Alcohol Use Yes   Alcohol/week: 4.0 - 6.0 standard drinks   Types: 4 - 6 Cans of beer per week   Comment: 2-3 times weekly     Social History   Substance and Sexual Activity  Drug Use Not Currently   Types: IV, Methamphetamines   Comment: using 1gm everyday heroin    Social History   Socioeconomic History   Marital status: Single    Spouse name: Not on file   Number of children: Not on file   Years of education: Not on file   Highest education level: Not on file  Occupational History   Not on file  Tobacco Use   Smoking status: Every Day    Packs/day: 0.25    Years: 1.00    Pack years: 0.25    Types: Cigarettes   Smokeless tobacco: Never  Vaping Use   Vaping Use: Some days   Start date: 11/04/2018   Devices: Jewel Menthol  Substance and Sexual Activity   Alcohol use: Yes    Alcohol/week: 4.0 - 6.0 standard drinks    Types: 4 -  6 Cans of beer per week    Comment: 2-3 times weekly   Drug use: Not Currently    Types: IV, Methamphetamines    Comment: using 1gm everyday heroin   Sexual activity: Yes  Other Topics Concern   Not on file  Social History Narrative   Not on file   Social Determinants of Health   Financial Resource Strain: Not on file  Food Insecurity: Not on file  Transportation Needs: Not on file  Physical Activity: Not on file  Stress: Not on file  Social Connections: Not on file   SDOH:  SDOH Screenings   Alcohol Screen: Low Risk    Last Alcohol Screening Score (AUDIT): 7  Depression (PHQ2-9): Medium Risk   PHQ-2 Score: 26  Financial Resource Strain: Not  on file  Food Insecurity: Not on file  Housing: Not on file  Physical Activity: Not on file  Social Connections: Not on file  Stress: Not on file  Tobacco Use: High Risk   Smoking Tobacco Use: Every Day   Smokeless Tobacco Use: Never  Transportation Needs: Not on file   Additional Social History:     Sleep: Fair  Appetite:  Good  Current Medications:  Current Facility-Administered Medications  Medication Dose Route Frequency Provider Last Rate Last Admin   acetaminophen (TYLENOL) tablet 650 mg  650 mg Oral Q6H PRN Estella Husk, MD       alum & mag hydroxide-simeth (MAALOX/MYLANTA) 200-200-20 MG/5ML suspension 30 mL  30 mL Oral Q4H PRN Estella Husk, MD       hydrOXYzine (ATARAX/VISTARIL) tablet 25 mg  25 mg Oral TID PRN Estella Husk, MD       magnesium hydroxide (MILK OF MAGNESIA) suspension 30 mL  30 mL Oral Daily PRN Estella Husk, MD       traZODone (DESYREL) tablet 50 mg  50 mg Oral QHS PRN Estella Husk, MD       venlafaxine XR (EFFEXOR-XR) 24 hr capsule 75 mg  75 mg Oral Q breakfast Estella Husk, MD   75 mg at 03/16/21 0915   Current Outpatient Medications  Medication Sig Dispense Refill   hydrOXYzine (ATARAX/VISTARIL) 25 MG tablet Take 1 tablet (25 mg total) by mouth 3 (three) times daily as needed for anxiety. (Patient not taking: Reported on 03/14/2021) 90 tablet 1   traZODone (DESYREL) 50 MG tablet Take 1 tablet (50 mg total) by mouth at bedtime as needed for sleep. (Patient not taking: Reported on 03/14/2021) 30 tablet 1   venlafaxine XR (EFFEXOR-XR) 75 MG 24 hr capsule Take 1 capsule (75 mg total) by mouth daily with breakfast. (Patient not taking: Reported on 03/14/2021) 30 capsule 1    Labs  Lab Results:  Admission on 03/14/2021, Discharged on 03/14/2021  Component Date Value Ref Range Status   SARS Coronavirus 2 by RT PCR 03/14/2021 NEGATIVE  NEGATIVE Final   Comment: (NOTE) SARS-CoV-2 target nucleic acids are NOT  DETECTED.  The SARS-CoV-2 RNA is generally detectable in upper respiratory specimens during the acute phase of infection. The lowest concentration of SARS-CoV-2 viral copies this assay can detect is 138 copies/mL. A negative result does not preclude SARS-Cov-2 infection and should not be used as the sole basis for treatment or other patient management decisions. A negative result may occur with  improper specimen collection/handling, submission of specimen other than nasopharyngeal swab, presence of viral mutation(s) within the areas targeted by this assay, and inadequate number of viral copies(<138 copies/mL).  A negative result must be combined with clinical observations, patient history, and epidemiological information. The expected result is Negative.  Fact Sheet for Patients:  BloggerCourse.com  Fact Sheet for Healthcare Providers:  SeriousBroker.it  This test is no                          t yet approved or cleared by the [place] FDA and  has been authorized for detection and/or diagnosis of SARS-CoV-2 by FDA under an Emergency Use Authorization (EUA). This EUA will remain  in effect (meaning this test can be used) for the duration of the COVID-19 declaration under Section 564(b)(1) of the Act, 21 U.S.C.section 360bbb-3(b)(1), unless the authorization is terminated  or revoked sooner.       Influenza A by PCR 03/14/2021 NEGATIVE  NEGATIVE Final   Influenza B by PCR 03/14/2021 NEGATIVE  NEGATIVE Final   Comment: (NOTE) The Xpert Xpress SARS-CoV-2/FLU/RSV plus assay is intended as an aid in the diagnosis of influenza from Nasopharyngeal swab specimens and should not be used as a sole basis for treatment. Nasal washings and aspirates are unacceptable for Xpert Xpress SARS-CoV-2/FLU/RSV testing.  Fact Sheet for Patients: BloggerCourse.com  Fact Sheet for Healthcare  Providers: SeriousBroker.it  This test is not yet approved or cleared by the [place] FDA and has been authorized for detection and/or diagnosis of SARS-CoV-2 by FDA under an Emergency Use Authorization (EUA). This EUA will remain in effect (meaning this test can be used) for the duration of the COVID-19 declaration under Section 564(b)(1) of the Act, 21 U.S.C. section 360bbb-3(b)(1), unless the authorization is terminated or revoked.  Performed at [facility], [address] [place], [place] [address]    Sodium 03/14/2021 139  135 - 145 mmol/L Final   Potassium 03/14/2021 4.5  3.5 - 5.1 mmol/L Final   Chloride 03/14/2021 104  98 - 111 mmol/L Final   CO2 03/14/2021 24  22 - 32 mmol/L Final   Glucose, Bld 03/14/2021 77  70 - 99 mg/dL Final   Glucose reference range applies only to samples taken after fasting for at least 8 hours.   BUN 03/14/2021 13  6 - 20 mg/dL Final   Creatinine, Ser 03/14/2021 0.70  0.61 - 1.24 mg/dL Final   Calcium 60/45/4098 9.8  8.9 - 10.3 mg/dL Final   Total Protein 11/91/4782 7.6  6.5 - 8.1 g/dL Final   Albumin 95/62/1308 4.4  3.5 - 5.0 g/dL Final   AST 65/78/4696 54 (A) 15 - 41 U/L Final   ALT 03/14/2021 66 (A) 0 - 44 U/L Final   Alkaline Phosphatase 03/14/2021 78  38 - 126 U/L Final   Total Bilirubin 03/14/2021 0.4  0.3 - 1.2 mg/dL Final   GFR, Estimated 03/14/2021 >60  >60 mL/min Final   Comment: (NOTE) Calculated using the CKD-EPI Creatinine Equation (2021)    Anion gap 03/14/2021 11  5 - 15 Final   Performed at [facility], [address] [place], [place] [address]   Alcohol, Ethyl (B) 03/14/2021 <10  <10 mg/dL Final   Comment: (NOTE) Lowest detectable limit for serum alcohol is 10 mg/dL.  For medical purposes only. Performed at [facility], [address], [place], [place] [address]    Opiates 03/14/2021 NONE DETECTED  NONE DETECTED Final   Cocaine  03/14/2021 NONE DETECTED  NONE DETECTED Final   Benzodiazepines 03/14/2021 NONE DETECTED  NONE DETECTED Final  Amphetamines 03/14/2021 NONE DETECTED  NONE DETECTED Final   Tetrahydrocannabinol 03/14/2021 NONE DETECTED  NONE DETECTED Final   Barbiturates 03/14/2021 NONE DETECTED  NONE DETECTED Final   Comment: (NOTE) DRUG SCREEN FOR MEDICAL PURPOSES ONLY.  IF CONFIRMATION IS NEEDED FOR ANY PURPOSE, NOTIFY LAB WITHIN 5 DAYS.  LOWEST DETECTABLE LIMITS FOR URINE DRUG SCREEN Drug Class                     Cutoff (ng/mL) Amphetamine and metabolites    1000 Barbiturate and metabolites    200 Benzodiazepine                 200 Tricyclics and metabolites     300 Opiates and metabolites        300 Cocaine and metabolites        300 THC                            50 Performed at [facility], [address] [place], [place] [address]    WBC 03/14/2021 7.2  4.0 - 10.5 K/uL Final   RBC 03/14/2021 5.25  4.22 - 5.81 MIL/uL Final   Hemoglobin 03/14/2021 15.2  13.0 - 17.0 g/dL Final   HCT 60/45/4098 47.9  39.0 - 52.0 % Final   MCV 03/14/2021 91.2  80.0 - 100.0 fL Final   MCH 03/14/2021 29.0  26.0 - 34.0 pg Final   MCHC 03/14/2021 31.7  30.0 - 36.0 g/dL Final   RDW 11/91/4782 13.8  11.5 - 15.5 % Final   Platelets 03/14/2021 266  150 - 400 K/uL Final   nRBC 03/14/2021 0.0  0.0 - 0.2 % Final   Neutrophils Relative % 03/14/2021 67  % Final   Neutro Abs 03/14/2021 4.8  1.7 - 7.7 K/uL Final   Lymphocytes Relative 03/14/2021 21  % Final   Lymphs Abs 03/14/2021 1.5  0.7 - 4.0 K/uL Final   Monocytes Relative 03/14/2021 9  % Final   Monocytes Absolute 03/14/2021 0.6  0.1 - 1.0 K/uL Final   Eosinophils Relative 03/14/2021 2  % Final   Eosinophils Absolute 03/14/2021 0.1  0.0 - 0.5 K/uL Final   Basophils Relative 03/14/2021 0  % Final   Basophils Absolute 03/14/2021 0.0  0.0 - 0.1 K/uL Final   Immature Granulocytes 03/14/2021 1  % Final   Abs Immature Granulocytes 03/14/2021 0.08  (A) 0.00 - 0.07 K/uL Final   Performed at [facility], [address], [place], [place] [address]   Acetaminophen (Tylenol), Serum 03/14/2021 <10 (A) 10 - 30 ug/mL Final   Comment: (NOTE) Therapeutic concentrations vary significantly. A range of 10-30 ug/mL  may be an effective concentration for many patients. However, some  are best treated at concentrations outside of this range. Acetaminophen concentrations >150 ug/mL at 4 hours after ingestion  and >50 ug/mL at 12 hours after ingestion are often associated with  toxic reactions.  Performed at [facility], [address], [place], [place] [address]    Salicylate Lvl 03/14/2021 <7.0 (A) 7.0 - 30.0 mg/dL Final   Performed at [facility], [address], [place], [place] [address]  Admission on 02/12/2021, Discharged on 02/18/2021  Component Date Value Ref Range Status   POC Amphetamine UR 02/12/2021 None Detected  NONE DETECTED (Cut Off Level 1000 ng/mL) Final   POC Secobarbital (BAR) 02/12/2021 None Detected  NONE DETECTED (Cut Off  Level 300 ng/mL) Final   POC Buprenorphine (BUP) 02/12/2021 None Detected  NONE DETECTED (Cut Off Level 10 ng/mL) Final   POC Oxazepam (BZO) 02/12/2021 None Detected  NONE DETECTED (Cut Off Level 300 ng/mL) Final   POC Cocaine UR 02/12/2021 None Detected  NONE DETECTED (Cut Off Level 300 ng/mL) Final   POC Methamphetamine UR 02/12/2021 None Detected  NONE DETECTED (Cut Off Level 1000 ng/mL) Final   POC Morphine 02/12/2021 Positive (A) NONE DETECTED (Cut Off Level 300 ng/mL) Final   POC Oxycodone UR 02/12/2021 None Detected  NONE DETECTED (Cut Off Level 100 ng/mL) Final   POC Methadone UR 02/12/2021 None Detected  NONE DETECTED (Cut Off Level 300 ng/mL) Final   POC Marijuana UR 02/12/2021 Positive (A) NONE DETECTED (Cut Off Level 50 ng/mL) Final  Admission on 02/12/2021, Discharged on 02/12/2021  Component Date Value Ref Range Status   SARS  Coronavirus 2 by RT PCR 02/12/2021 NEGATIVE  NEGATIVE Final   Comment: (NOTE) SARS-CoV-2 target nucleic acids are NOT DETECTED.  The SARS-CoV-2 RNA is generally detectable in upper respiratory specimens during the acute phase of infection. The lowest concentration of SARS-CoV-2 viral copies this assay can detect is 138 copies/mL. A negative result does not preclude SARS-Cov-2 infection and should not be used as the sole basis for treatment or other patient management decisions. A negative result may occur with  improper specimen collection/handling, submission of specimen other than nasopharyngeal swab, presence of viral mutation(s) within the areas targeted by this assay, and inadequate number of viral copies(<138 copies/mL). A negative result must be combined with clinical observations, patient history, and epidemiological information. The expected result is Negative.  Fact Sheet for Patients:  BloggerCourse.com  Fact Sheet for Healthcare Providers:  SeriousBroker.it  This test is no                          t yet approved or cleared by the [place] FDA and  has been authorized for detection and/or diagnosis of SARS-CoV-2 by FDA under an Emergency Use Authorization (EUA). This EUA will remain  in effect (meaning this test can be used) for the duration of the COVID-19 declaration under Section 564(b)(1) of the Act, 21 U.S.C.section 360bbb-3(b)(1), unless the authorization is terminated  or revoked sooner.       Influenza A by PCR 02/12/2021 NEGATIVE  NEGATIVE Final   Influenza B by PCR 02/12/2021 NEGATIVE  NEGATIVE Final   Comment: (NOTE) The Xpert Xpress SARS-CoV-2/FLU/RSV plus assay is intended as an aid in the diagnosis of influenza from Nasopharyngeal swab specimens and should not be used as a sole basis for treatment. Nasal washings and aspirates are unacceptable for Xpert Xpress SARS-CoV-2/FLU/RSV testing.  Fact  Sheet for Patients: BloggerCourse.com  Fact Sheet for Healthcare Providers: SeriousBroker.it  This test is not yet approved or cleared by the [place] FDA and has been authorized for detection and/or diagnosis of SARS-CoV-2 by FDA under an Emergency Use Authorization (EUA). This EUA will remain in effect (meaning this test can be used) for the duration of the COVID-19 declaration under Section 564(b)(1) of the Act, 21 U.S.C. section 360bbb-3(b)(1), unless the authorization is terminated or revoked.  Performed at [facility], [address] [place], [place] [address]    Sodium 02/12/2021 135  135 - 145 mmol/L Final   Potassium 02/12/2021 4.1  3.5 - 5.1 mmol/L Final   Chloride 02/12/2021 102  98 - 111 mmol/L Final  CO2 02/12/2021 28  22 - 32 mmol/L Final   Glucose, Bld 02/12/2021 92  70 - 99 mg/dL Final   Glucose reference range applies only to samples taken after fasting for at least 8 hours.   BUN 02/12/2021 13  6 - 20 mg/dL Final   Creatinine, Ser 02/12/2021 0.56 (A) 0.61 - 1.24 mg/dL Final   Calcium 40/98/1191 8.9  8.9 - 10.3 mg/dL Final   Total Protein 47/82/9562 6.7  6.5 - 8.1 g/dL Final   Albumin 13/02/6577 3.9  3.5 - 5.0 g/dL Final   AST 46/96/2952 19  15 - 41 U/L Final   ALT 02/12/2021 15  0 - 44 U/L Final   Alkaline Phosphatase 02/12/2021 75  38 - 126 U/L Final   Total Bilirubin 02/12/2021 0.6  0.3 - 1.2 mg/dL Final   GFR, Estimated 02/12/2021 >60  >60 mL/min Final   Comment: (NOTE) Calculated using the CKD-EPI Creatinine Equation (2021)    Anion gap 02/12/2021 5  5 - 15 Final   Performed at [facility], [address], [place], [place] [address]   Alcohol, Ethyl (B) 02/12/2021 <10  <10 mg/dL Final   Comment: (NOTE) Lowest detectable limit for serum alcohol is 10 mg/dL.  For medical purposes only. Performed at [facility], [address], [place], [place] [address]    WBC 02/12/2021 7.1  4.0 - 10.5 K/uL Final   RBC 02/12/2021 4.68  4.22 - 5.81 MIL/uL Final   Hemoglobin 02/12/2021 13.4  13.0 - 17.0 g/dL Final   HCT 27/25/3664 40.7  39.0 - 52.0 % Final   MCV 02/12/2021 87.0  80.0 - 100.0 fL Final   MCH 02/12/2021 28.6  26.0 - 34.0 pg Final   MCHC 02/12/2021 32.9  30.0 - 36.0 g/dL Final   RDW 40/34/7425 13.0  11.5 - 15.5 % Final   Platelets 02/12/2021 263  150 - 400 K/uL Final   nRBC 02/12/2021 0.0  0.0 - 0.2 % Final   Neutrophils Relative % 02/12/2021 73  % Final   Neutro Abs 02/12/2021 5.2  1.7 - 7.7 K/uL Final   Lymphocytes Relative 02/12/2021 18  % Final   Lymphs Abs 02/12/2021 1.3  0.7 - 4.0 K/uL Final   Monocytes Relative 02/12/2021 5  % Final   Monocytes Absolute 02/12/2021 0.4  0.1 - 1.0 K/uL Final   Eosinophils Relative 02/12/2021 3  % Final   Eosinophils Absolute 02/12/2021 0.2  0.0 - 0.5 K/uL Final   Basophils Relative 02/12/2021 1  % Final   Basophils Absolute 02/12/2021 0.1  0.0 - 0.1 K/uL Final   Immature Granulocytes 02/12/2021 0  % Final   Abs Immature Granulocytes 02/12/2021 0.03  0.00 - 0.07 K/uL Final   Performed at [facility], [address], [place], [place] [address]   Salicylate Lvl 02/12/2021 <7.0 (A) 7.0 - 30.0 mg/dL Final   Performed at [facility], [address] [place], [place] [address]   Acetaminophen (Tylenol), Serum 02/12/2021 <10 (A) 10 - 30 ug/mL Final   Comment: (NOTE) Therapeutic concentrations vary significantly. A range of 10-30 ug/mL  may be an effective concentration for many patients. However, some  are best treated at concentrations outside of this range. Acetaminophen concentrations >150 ug/mL at 4 hours after ingestion  and >50 ug/mL at 12 hours after ingestion are often associated with  toxic reactions.  Performed at [facility], [address] [place], [place] [address]  Admission on 02/08/2021,  Discharged on 02/10/2021  Component Date Value Ref Range Status   SARS Coronavirus 2 by RT PCR 02/08/2021 NEGATIVE  NEGATIVE Final   Comment: (NOTE) SARS-CoV-2 target nucleic acids are NOT DETECTED.  The SARS-CoV-2 RNA is generally detectable in upper respiratory specimens during the acute phase of infection. The lowest concentration of SARS-CoV-2 viral copies this assay can detect is 138 copies/mL. A negative result does not preclude SARS-Cov-2 infection and should not be used as the sole basis for treatment or other patient management decisions. A negative result may occur with  improper specimen collection/handling, submission of specimen other than nasopharyngeal swab, presence of viral mutation(s) within the areas targeted by this assay, and inadequate number of viral copies(<138 copies/mL). A negative result must be combined with clinical observations, patient history, and epidemiological information. The expected result is Negative.  Fact Sheet for Patients:  BloggerCourse.com  Fact Sheet for Healthcare Providers:  SeriousBroker.it  This test is no                          t yet approved or cleared by the [place] FDA and  has been authorized for detection and/or diagnosis of SARS-CoV-2 by FDA under an Emergency Use Authorization (EUA). This EUA will remain  in effect (meaning this test can be used) for the duration of the COVID-19 declaration under Section 564(b)(1) of the Act, 21 U.S.C.section 360bbb-3(b)(1), unless the authorization is terminated  or revoked sooner.       Influenza A by PCR 02/08/2021 NEGATIVE  NEGATIVE Final   Influenza B by PCR 02/08/2021 NEGATIVE  NEGATIVE Final   Comment: (NOTE) The Xpert Xpress SARS-CoV-2/FLU/RSV plus assay is intended as an aid in the diagnosis of influenza from Nasopharyngeal swab specimens and should not be used as a sole basis for treatment. Nasal washings and aspirates  are unacceptable for Xpert Xpress SARS-CoV-2/FLU/RSV testing.  Fact Sheet for Patients: BloggerCourse.com  Fact Sheet for Healthcare Providers: SeriousBroker.it  This test is not yet approved or cleared by the [place] FDA and has been authorized for detection and/or diagnosis of SARS-CoV-2 by FDA under an Emergency Use Authorization (EUA). This EUA will remain in effect (meaning this test can be used) for the duration of the COVID-19 declaration under Section 564(b)(1) of the Act, 21 U.S.C. section 360bbb-3(b)(1), unless the authorization is terminated or revoked.  Performed at [facility], [address], [place], [place] [address]    WBC 02/08/2021 6.5  4.0 - 10.5 K/uL Final   RBC 02/08/2021 5.15  4.22 - 5.81 MIL/uL Final   Hemoglobin 02/08/2021 14.9  13.0 - 17.0 g/dL Final   HCT 19/14/7829 44.9  39.0 - 52.0 % Final   MCV 02/08/2021 87.2  80.0 - 100.0 fL Final   MCH 02/08/2021 28.9  26.0 - 34.0 pg Final   MCHC 02/08/2021 33.2  30.0 - 36.0 g/dL Final   RDW 56/21/3086 13.0  11.5 - 15.5 % Final   Platelets 02/08/2021 330  150 - 400 K/uL Final   nRBC 02/08/2021 0.0  0.0 - 0.2 % Final   Neutrophils Relative % 02/08/2021 69  % Final   Neutro Abs 02/08/2021 4.5  1.7 - 7.7 K/uL Final   Lymphocytes Relative 02/08/2021 20  % Final   Lymphs Abs 02/08/2021 1.3  0.7 - 4.0 K/uL Final   Monocytes Relative 02/08/2021 7  % Final   Monocytes Absolute 02/08/2021 0.4  0.1 - 1.0  K/uL Final   Eosinophils Relative 02/08/2021 3  % Final   Eosinophils Absolute 02/08/2021 0.2  0.0 - 0.5 K/uL Final   Basophils Relative 02/08/2021 0  % Final   Basophils Absolute 02/08/2021 0.0  0.0 - 0.1 K/uL Final   Immature Granulocytes 02/08/2021 1  % Final   Abs Immature Granulocytes 02/08/2021 0.05  0.00 - 0.07 K/uL Final   Performed at [facility], [address] [place], [place] [address]   Sodium 02/08/2021 137  135 - 145 mmol/L Final    Potassium 02/08/2021 4.4  3.5 - 5.1 mmol/L Final   Chloride 02/08/2021 101  98 - 111 mmol/L Final   CO2 02/08/2021 26  22 - 32 mmol/L Final   Glucose, Bld 02/08/2021 75  70 - 99 mg/dL Final   Glucose reference range applies only to samples taken after fasting for at least 8 hours.   BUN 02/08/2021 12  6 - 20 mg/dL Final   Creatinine, Ser 02/08/2021 0.83  0.61 - 1.24 mg/dL Final   Calcium 60/45/4098 9.9  8.9 - 10.3 mg/dL Final   Total Protein 11/91/4782 6.9  6.5 - 8.1 g/dL Final   Albumin 95/62/1308 4.0  3.5 - 5.0 g/dL Final   AST 65/78/4696 19  15 - 41 U/L Final   ALT 02/08/2021 19  0 - 44 U/L Final   Alkaline Phosphatase 02/08/2021 87  38 - 126 U/L Final   Total Bilirubin 02/08/2021 0.4  0.3 - 1.2 mg/dL Final   GFR, Estimated 02/08/2021 >60  >60 mL/min Final   Comment: (NOTE) Calculated using the CKD-EPI Creatinine Equation (2021)    Anion gap 02/08/2021 10  5 - 15 Final   Performed at [facility], [address] [place], [place] [address]   Alcohol, Ethyl (B) 02/08/2021 <10  <10 mg/dL Final   Comment: (NOTE) Lowest detectable limit for serum alcohol is 10 mg/dL.  For medical purposes only. Performed at [facility], [address] [place], [place] [address]    POC Amphetamine UR 02/08/2021 None Detected  NONE DETECTED (Cut Off Level 1000 ng/mL) Final   POC Secobarbital (BAR) 02/08/2021 None Detected  NONE DETECTED (Cut Off Level 300 ng/mL) Final   POC Buprenorphine (BUP) 02/08/2021 None Detected  NONE DETECTED (Cut Off Level 10 ng/mL) Final   POC Oxazepam (BZO) 02/08/2021 None Detected  NONE DETECTED (Cut Off Level 300 ng/mL) Final   POC Cocaine UR 02/08/2021 None Detected  NONE DETECTED (Cut Off Level 300 ng/mL) Final   POC Methamphetamine UR 02/08/2021 None Detected  NONE DETECTED (Cut Off Level 1000 ng/mL) Final   POC Morphine 02/08/2021 None Detected  NONE DETECTED (Cut Off Level 300 ng/mL) Final   POC Oxycodone UR 02/08/2021 None Detected  NONE DETECTED (Cut  Off Level 100 ng/mL) Final   POC Methadone UR 02/08/2021 None Detected  NONE DETECTED (Cut Off Level 300 ng/mL) Final   POC Marijuana UR 02/08/2021 None Detected  NONE DETECTED (Cut Off Level 50 ng/mL) Final   SARSCOV2ONAVIRUS 2 AG 02/08/2021 NEGATIVE  NEGATIVE Final   Comment: (NOTE) SARS-CoV-2 antigen NOT DETECTED.   Negative results are presumptive.  Negative results do not preclude SARS-CoV-2 infection and should not be used as the sole basis for treatment or other patient management decisions, including infection  control decisions, particularly in the presence of clinical signs and  symptoms consistent with COVID-19, or in those who have been in contact with the virus.  Negative results must  be combined with clinical observations, patient history, and epidemiological information. The expected result is Negative.  Fact Sheet for Patients: https://www.jennings-kim.com/  Fact Sheet for Healthcare Providers: https://alexander-rogers.biz/  This test is not yet approved or cleared by the [place] FDA and  has been authorized for detection and/or diagnosis of SARS-CoV-2 by FDA under an Emergency Use Authorization (EUA).  This EUA will remain in effect (meaning this test can be used) for the duration of  the COV                          ID-19 declaration under Section 564(b)(1) of the Act, 21 U.S.C. section 360bbb-3(b)(1), unless the authorization is terminated or revoked sooner.     SARS Coronavirus 2 Ag 02/08/2021 Negative  Negative Final   SARSCOV2ONAVIRUS 2 AG 02/08/2021 NEGATIVE  NEGATIVE Final   Comment: (NOTE) SARS-CoV-2 antigen NOT DETECTED.   Negative results are presumptive.  Negative results do not preclude SARS-CoV-2 infection and should not be used as the sole basis for treatment or other patient management decisions, including infection  control decisions, particularly in the presence of clinical signs and  symptoms consistent with  COVID-19, or in those who have been in contact with the virus.  Negative results must be combined with clinical observations, patient history, and epidemiological information. The expected result is Negative.  Fact Sheet for Patients: https://www.jennings-kim.com/  Fact Sheet for Healthcare Providers: https://alexander-rogers.biz/  This test is not yet approved or cleared by the [place] FDA and  has been authorized for detection and/or diagnosis of SARS-CoV-2 by FDA under an Emergency Use Authorization (EUA).  This EUA will remain in effect (meaning this test can be used) for the duration of  the COV                          ID-19 declaration under Section 564(b)(1) of the Act, 21 U.S.C. section 360bbb-3(b)(1), unless the authorization is terminated or revoked sooner.    Admission on 11/30/2020, Discharged on 12/04/2020  Component Date Value Ref Range Status   Hepatitis B Surface Ag 12/01/2020 NON REACTIVE  NON REACTIVE Final   HCV Ab 12/01/2020 Reactive (A) NON REACTIVE Final   Comment: (NOTE) The CDC recommends that a Reactive HCV antibody result be followed up  with a HCV Nucleic Acid Amplification test.     Hep A IgM 12/01/2020 See Scanned report in [facility] (A) NON REACTIVE Final   Performed at [facility]   Hep B C IgM 12/01/2020 NON REACTIVE  NON REACTIVE Final   Performed at [facility], [address], [place], [place] [address]   Sodium 12/01/2020 137  135 - 145 mmol/L Final   Potassium 12/01/2020 4.8  3.5 - 5.1 mmol/L Final   Chloride 12/01/2020 102  98 - 111 mmol/L Final   CO2 12/01/2020 25  22 - 32 mmol/L Final   Glucose, Bld 12/01/2020 132 (A) 70 - 99 mg/dL Final   Glucose reference range applies only to samples taken after fasting for at least 8 hours.   BUN 12/01/2020 15  6 - 20 mg/dL Final   Creatinine, Ser 12/01/2020 0.80  0.61 - 1.24 mg/dL Final   Calcium 38/46/6599 9.7  8.9 - 10.3 mg/dL Final   Total  Protein 12/01/2020 7.9  6.5 - 8.1 g/dL Final   Albumin 35/70/1779 4.5  3.5 - 5.0 g/dL Final   AST 39/09/90 59 (A)  15 - 41 U/L Final   ALT 12/01/2020 93 (A) 0 - 44 U/L Final   Alkaline Phosphatase 12/01/2020 77  38 - 126 U/L Final   Total Bilirubin 12/01/2020 0.6  0.3 - 1.2 mg/dL Final   GFR, Estimated 12/01/2020 >60  >60 mL/min Final   Comment: (NOTE) Calculated using the CKD-EPI Creatinine Equation (2021)    Anion gap 12/01/2020 10  5 - 15 Final   Performed at [facility], [address] [place], [place] [address]   TSH 12/01/2020 0.276 (A) 0.350 - 4.500 uIU/mL Final   Comment: Performed by a 3rd Generation assay with a functional sensitivity of <=0.01 uIU/mL. Performed at [facility], [address], [place], [place] [address]    Free T4 12/03/2020 0.76  0.61 - 1.12 ng/dL Final   Comment: (NOTE) Biotin ingestion may interfere with free T4 tests. If the results are inconsistent with the TSH level, previous test results, or the clinical presentation, then consider biotin interference. If needed, order repeat testing after stopping biotin. Performed at [facility], [address], [place], [place] [address]    T3, Free 12/03/2020 2.5  2.0 - 4.4 pg/mL Final   Comment: (NOTE) Performed At: [facility] [address] [place], [place] [id number] Jolene Schimke MD OZ:[id number]    Cholesterol 12/03/2020 174  0 - 200 mg/dL Final   Triglycerides 62/95/2841 84  <150 mg/dL Final   HDL 32/44/0102 61  >40 mg/dL Final   Total CHOL/HDL Ratio 12/03/2020 2.9  RATIO Final   VLDL 12/03/2020 17  0 - 40 mg/dL Final   LDL Cholesterol 12/03/2020 96  0 - 99 mg/dL Final   Comment:        Total Cholesterol/HDL:CHD Risk Coronary Heart Disease Risk Table                     Men   Women  1/2 Average Risk   3.4   3.3  Average Risk       5.0   4.4  2 X Average Risk   9.6   7.1  3 X Average Risk  23.4   11.0        Use the calculated Patient  Ratio above and the CHD Risk Table to determine the patient's CHD Risk.        ATP III CLASSIFICATION (LDL):  <100     mg/dL   Optimal  725-366  mg/dL   Near or Above                    Optimal  130-159  mg/dL   Borderline  440-347  mg/dL   High  >425     mg/dL   Very High Performed at [facility], [address], [place], [place] [address]    HCV Quantitative 12/03/2020 68,900  >50 IU/mL Final   HCV Quantitative Log 12/03/2020 4.838  >1.70 log10 IU/mL Final   Test Information 12/03/2020 Comment   Final   Comment: (NOTE) The quantitative range of this assay is 15 IU/mL to 100 million IU/mL. Performed At: [facility] [address] [place], [place] [id number] Jolene Schimke MD [id number]    Total Protein 12/03/2020 7.1  6.5 - 8.1 g/dL Final   Albumin 60/04/9322 4.3  3.5 - 5.0 g/dL Final   AST 55/73/2202 49 (A) 15 - 41 U/L Final   ALT 12/03/2020 89 (A) 0 - 44 U/L Final  Alkaline Phosphatase 12/03/2020 68  38 - 126 U/L Final   Total Bilirubin 12/03/2020 0.5  0.3 - 1.2 mg/dL Final   Bilirubin, Direct 12/03/2020 <0.1  0.0 - 0.2 mg/dL Final   Indirect Bilirubin 12/03/2020 NOT CALCULATED  0.3 - 0.9 mg/dL Final   Performed at [facility], [address], [place], [place] [address]   Hgb A1c MFr Bld 12/03/2020 5.3  4.8 - 5.6 % Final   Comment: (NOTE)         Prediabetes: 5.7 - 6.4         Diabetes: >6.4         Glycemic control for adults with diabetes: <7.0    Mean Plasma Glucose 12/03/2020 105  mg/dL Final   Comment: (NOTE) Performed At: [facility] [address] [place], [place] [id number] Jolene Schimke MD XB:[id number]    Hepatitis C Quantitation 12/01/2020 27,600  IU/mL Final   HCV log10 12/01/2020 4.441  log10 IU/mL Final   Test Information (HCV): 12/01/2020 Comment   Final   Comment: (NOTE) The quantitative range of this assay is 15 IU/mL to 100 million IU/mL.    Interpretation (HCV): 12/01/2020 Comment    Final   Comment: (NOTE) Positive HCV antibody screen with the presence of HCV RNA is consistent with active infection. Performed At: [facility] [address] [place], [place] [id number] Jolene Schimke MD [id number]   Admission on 11/30/2020, Discharged on 11/30/2020  Component Date Value Ref Range Status   Sodium 11/30/2020 133 (A) 135 - 145 mmol/L Final   Potassium 11/30/2020 3.6  3.5 - 5.1 mmol/L Final   Chloride 11/30/2020 100  98 - 111 mmol/L Final   CO2 11/30/2020 24  22 - 32 mmol/L Final   Glucose, Bld 11/30/2020 106 (A) 70 - 99 mg/dL Final   Glucose reference range applies only to samples taken after fasting for at least 8 hours.   BUN 11/30/2020 13  6 - 20 mg/dL Final   Creatinine, Ser 11/30/2020 0.94  0.61 - 1.24 mg/dL Final   Calcium 40/04/2724 9.4  8.9 - 10.3 mg/dL Final   Total Protein 36/64/4034 6.4 (A) 6.5 - 8.1 g/dL Final   Albumin 74/25/9563 3.9  3.5 - 5.0 g/dL Final   AST 87/56/4332 34  15 - 41 U/L Final   ALT 11/30/2020 58 (A) 0 - 44 U/L Final   Alkaline Phosphatase 11/30/2020 64  38 - 126 U/L Final   Total Bilirubin 11/30/2020 0.5  0.3 - 1.2 mg/dL Final   GFR, Estimated 11/30/2020 >60  >60 mL/min Final   Comment: (NOTE) Calculated using the CKD-EPI Creatinine Equation (2021)    Anion gap 11/30/2020 9  5 - 15 Final   Performed at [facility], [address] [place], [place] [address]   Alcohol, Ethyl (B) 11/30/2020 <10  <10 mg/dL Final   Comment: (NOTE) Lowest detectable limit for serum alcohol is 10 mg/dL.  For medical purposes only. Performed at [facility], [address], [place], [place] [address]    Salicylate Lvl 11/30/2020 <7.0 (A) 7.0 - 30.0 mg/dL Final   Performed at [facility], [address] [place], [place] [address]   Acetaminophen (Tylenol), Serum 11/30/2020 <10 (A) 10 - 30 ug/mL Final   Comment: (NOTE) Therapeutic concentrations vary significantly. A range of 10-30 ug/mL  may be an effective  concentration for many patients. However, some  are best treated at concentrations outside of this range. Acetaminophen concentrations >150  ug/mL at 4 hours after ingestion  and >50 ug/mL at 12 hours after ingestion are often associated with  toxic reactions.  Performed at [facility], [address], [place], [place] [address]    WBC 11/30/2020 8.6  4.0 - 10.5 K/uL Final   RBC 11/30/2020 4.26  4.22 - 5.81 MIL/uL Final   Hemoglobin 11/30/2020 12.5 (A) 13.0 - 17.0 g/dL Final   HCT 62/13/0865 37.7 (A) 39.0 - 52.0 % Final   MCV 11/30/2020 88.5  80.0 - 100.0 fL Final   MCH 11/30/2020 29.3  26.0 - 34.0 pg Final   MCHC 11/30/2020 33.2  30.0 - 36.0 g/dL Final   RDW 78/46/9629 13.1  11.5 - 15.5 % Final   Platelets 11/30/2020 220  150 - 400 K/uL Final   nRBC 11/30/2020 0.0  0.0 - 0.2 % Final   Performed at [facility], [address], [place], [place] [address]   Opiates 11/30/2020 POSITIVE (A) NONE DETECTED Final   Cocaine 11/30/2020 NONE DETECTED  NONE DETECTED Final   Benzodiazepines 11/30/2020 NONE DETECTED  NONE DETECTED Final   Amphetamines 11/30/2020 NONE DETECTED  NONE DETECTED Final   Tetrahydrocannabinol 11/30/2020 NONE DETECTED  NONE DETECTED Final   Barbiturates 11/30/2020 NONE DETECTED  NONE DETECTED Final   Comment: (NOTE) DRUG SCREEN FOR MEDICAL PURPOSES ONLY.  IF CONFIRMATION IS NEEDED FOR ANY PURPOSE, NOTIFY LAB WITHIN 5 DAYS.  LOWEST DETECTABLE LIMITS FOR URINE DRUG SCREEN Drug Class                     Cutoff (ng/mL) Amphetamine and metabolites    1000 Barbiturate and metabolites    200 Benzodiazepine                 200 Tricyclics and metabolites     300 Opiates and metabolites        300 Cocaine and metabolites        300 THC                            50 Performed at [facility], [address] [place], [place] [address]    SARS Coronavirus 2 by RT PCR 11/30/2020 NEGATIVE  NEGATIVE Final   Comment: (NOTE) SARS-CoV-2 target nucleic  acids are NOT DETECTED.  The SARS-CoV-2 RNA is generally detectable in upper respiratory specimens during the acute phase of infection. The lowest concentration of SARS-CoV-2 viral copies this assay can detect is 138 copies/mL. A negative result does not preclude SARS-Cov-2 infection and should not be used as the sole basis for treatment or other patient management decisions. A negative result may occur with  improper specimen collection/handling, submission of specimen other than nasopharyngeal swab, presence of viral mutation(s) within the areas targeted by this assay, and inadequate number of viral copies(<138 copies/mL). A negative result must be combined with clinical observations, patient history, and epidemiological information. The expected result is Negative.  Fact Sheet for Patients:  BloggerCourse.com  Fact Sheet for Healthcare Providers:  SeriousBroker.it  This test is no                          t yet approved or cleared by the [place] FDA and  has been authorized for detection and/or diagnosis of SARS-CoV-2 by FDA under an Emergency Use Authorization (EUA). This EUA will remain  in effect (meaning this test can  be used) for the duration of the COVID-19 declaration under Section 564(b)(1) of the Act, 21 U.S.C.section 360bbb-3(b)(1), unless the authorization is terminated  or revoked sooner.       Influenza A by PCR 11/30/2020 NEGATIVE  NEGATIVE Final   Influenza B by PCR 11/30/2020 NEGATIVE  NEGATIVE Final   Comment: (NOTE) The Xpert Xpress SARS-CoV-2/FLU/RSV plus assay is intended as an aid in the diagnosis of influenza from Nasopharyngeal swab specimens and should not be used as a sole basis for treatment. Nasal washings and aspirates are unacceptable for Xpert Xpress SARS-CoV-2/FLU/RSV testing.  Fact Sheet for Patients: BloggerCourse.comhttps://www.fda.gov/media/152166/download  Fact Sheet for Healthcare  Providers: SeriousBroker.ithttps://www.fda.gov/media/152162/download  This test is not yet approved or cleared by the [place] FDA and has been authorized for detection and/or diagnosis of SARS-CoV-2 by FDA under an Emergency Use Authorization (EUA). This EUA will remain in effect (meaning this test can be used) for the duration of the COVID-19 declaration under Section 564(b)(1) of the Act, 21 U.S.C. section 360bbb-3(b)(1), unless the authorization is terminated or revoked.  Performed at [facility], [address] [place], [place] [address]     Blood Alcohol level:  Lab Results  Component Value Date   ETH <10 03/14/2021   ETH <10 02/12/2021    Metabolic Disorder Labs: Lab Results  Component Value Date   HGBA1C 5.3 12/03/2020   MPG 105 12/03/2020   MPG 108.28 11/23/2019   No results found for: PROLACTIN Lab Results  Component Value Date   CHOL 174 12/03/2020   TRIG 84 12/03/2020   HDL 61 12/03/2020   CHOLHDL 2.9 12/03/2020   VLDL 17 12/03/2020   LDLCALC 96 12/03/2020   LDLCALC 116 (H) 11/23/2019    Therapeutic Lab Levels: No results found for: LITHIUM No results found for: VALPROATE No components found for:  CBMZ  Physical Findings   AIMS    Flowsheet Row [facility] Ad[facility]  AIMS Total Score 0 0      AUDIT    Flowsheet Row [facility]) [facility] Ad[facility] (Discharged[facility]  Alcohol Use Disorder Identification Test Final Score (AUDIT) 7 8      PHQ2-9    Flowsheet Row [facility]  PHQ-2 Total Score 5  PHQ-9 Total Score 26      Flowsheet Row [facility] Most recent  reading at 03/14/2021 12:20 PM ED from 03/14/2021 in [facility] Most recent reading at 03/14/2021 10:20 AM ED from 03/06/2021 in [facility] Most recent reading at 03/06/2021  1:14 AM  C-SSRS RISK CATEGORY High Risk High Risk Error: Q3, 4, or 5 should not be populated when Q2 is No        Musculoskeletal  Strength & Muscle Tone: within normal limits Gait & Station: normal Patient leans: N/A  Psychiatric Specialty Exam  Presentation  General Appearance: Appropriate for Environment  Eye Contact:Fair  Speech:Clear and Coherent; Normal Rate  Speech Volume:Normal  Handedness:Right   Mood and Affect  Mood:Depressed  Affect:Congruent   Thought Process  Thought Processes:Coherent  Descriptions of Associations:Intact  Orientation:None  Thought Content:Logical  Diagnosis of Schizophrenia or Schizoaffective disorder in past: No    Hallucinations:Hallucinations: None  Ideas  of Reference:None  Suicidal Thoughts:Suicidal Thoughts: Yes, Passive SI Active Intent and/or Plan: With Plan; With Access to Means; Without Intent SI Passive Intent and/or Plan: Without Intent; With Plan; With Means to Carry Out  Homicidal Thoughts:Homicidal Thoughts: No   Sensorium  Memory:Immediate Good; Recent Good; Remote Good  Judgment:Fair  Insight:Fair   Executive Functions  Concentration:Good  Attention Span:Good  Recall:Good  Fund of Knowledge:Good  Language:Good   Psychomotor Activity  Psychomotor Activity:Psychomotor Activity: Normal   Assets  Assets:Communication Skills; Desire for Improvement; Leisure Time; Physical Health; Resilience   Sleep  Sleep:Sleep: Fair Number of Hours of Sleep: 5   No data recorded  Physical Exam  Physical Exam Vitals and nursing note reviewed.  Constitutional:      Appearance: Normal appearance. He is well-developed.  HENT:     Head: Normocephalic.  Eyes:      General:        Right eye: No discharge.        Left eye: No discharge.     Conjunctiva/sclera: Conjunctivae normal.  Cardiovascular:     Rate and Rhythm: Normal rate.     Heart sounds: No murmur heard. Pulmonary:     Effort: Pulmonary effort is normal. No respiratory distress.  Musculoskeletal:        General: Normal range of motion.     Cervical back: Normal range of motion.  Skin:    Coloration: Skin is not jaundiced or pale.  Neurological:     Mental Status: He is alert and oriented to person, place, and time.  Psychiatric:        Attention and Perception: Attention and perception normal.        Mood and Affect: Mood is depressed.        Speech: Speech normal.        Behavior: Behavior normal. Behavior is cooperative.        Thought Content: Thought content includes suicidal ideation. Thought content includes suicidal plan.        Cognition and Memory: Cognition normal.        Judgment: Judgment is impulsive.   Review of Systems  Constitutional: Negative.  Negative for chills and fever.  HENT: Negative.  Negative for hearing loss.   Eyes: Negative.   Respiratory: Negative.  Negative for cough.   Cardiovascular: Negative.   Musculoskeletal: Negative.   Skin: Negative.   Neurological: Negative.   Psychiatric/Behavioral:  Positive for depression and suicidal ideas.   Blood pressure (!) 147/98, pulse 95, temperature 97.7 F (36.5 C), temperature source Tympanic, resp. rate 18, SpO2 100 %. There is no height or weight on file to calculate BMI.  Treatment Plan Summary: Daily contact with patient to assess and evaluate symptoms and progress in treatment and Medication management.   Patient continues to meet criteria [facility] in the [facility].  Patient would like to talk with social work in the morning concerning the resources that was provided to him.  No medication changes at this time we will continue to monitor.Ardis Hughs, NP 03/16/2021 12:31 PM

## 2021-03-16 NOTE — ED Notes (Signed)
Pt sitting in dining room watching TV. A&O x4, calm and cooperative. When asked if pt is experiencing SI, pt states, "not anymore that I seen y'all." Pt denies HI/AVH also. Pt given sandwich, muffin, and juice per his request. No signs of acute distress noted. Will continue to monitor for safety.

## 2021-03-16 NOTE — Progress Notes (Signed)
Patient did not attend morning group, patient entered into the room , stayed for 5 minutes and apparently coughed and headed to the restroom and never went back to finish group, but returned to his bed to lay down. Patient understands the program requires group participation.

## 2021-03-16 NOTE — Progress Notes (Signed)
Patient is alert and oriented X 4 with complaints of depression and continued suicidal thoughts with plan of jumping off a bridge/ walking into traffic. Patient denies any issues of sleep or appetite. Patient received scheduled medication of Effexor-XR this morning no issues with medication. Nursing staff will continue to monitor patient throughout the shift.

## 2021-03-16 NOTE — ED Notes (Signed)
Pt is in bed sleeping, respirations are even/unlabored, environment check complete/secure, will continue to monitor patients for safety

## 2021-03-16 NOTE — ED Notes (Signed)
Pt is in bed sleeping, respirations even/unlabored, environment check complete

## 2021-03-16 NOTE — Progress Notes (Signed)
Patient resting in bed with even an unlabored respirations. No objective signs of distress. Staff will continue to monitor.

## 2021-03-17 DIAGNOSIS — Z5900 Homelessness unspecified: Secondary | ICD-10-CM | POA: Diagnosis not present

## 2021-03-17 DIAGNOSIS — F339 Major depressive disorder, recurrent, unspecified: Secondary | ICD-10-CM | POA: Diagnosis not present

## 2021-03-17 DIAGNOSIS — R45851 Suicidal ideations: Secondary | ICD-10-CM | POA: Diagnosis not present

## 2021-03-17 DIAGNOSIS — Z79899 Other long term (current) drug therapy: Secondary | ICD-10-CM | POA: Diagnosis not present

## 2021-03-17 NOTE — ED Notes (Signed)
Pt requested Vistaril for increased anxiety. Denies cause of increased anxiety. Safety maintained.

## 2021-03-17 NOTE — ED Notes (Signed)
Pt sitting in dayroom, no acute distress noted. Safety maintained.

## 2021-03-17 NOTE — ED Notes (Signed)
Pt given breakfast.

## 2021-03-17 NOTE — ED Notes (Signed)
Pt asleep in bed. Respirations even and unlabored. Will continue to monitor for safety. ?

## 2021-03-17 NOTE — ED Notes (Addendum)
Pt had lunch 

## 2021-03-17 NOTE — ED Notes (Signed)
Pt sitting in dining room watching TV. A&O x4, calm and cooperative. When asked if pt is experiencing SI, pt states, "not with you here." Pt denies HI/AVH. Muffin, graham crackers, and milk given per pt request. No signs of acute distress noted. Will continue to monitor for safety.

## 2021-03-17 NOTE — ED Notes (Signed)
Pt refused to attend team group

## 2021-03-17 NOTE — ED Notes (Signed)
Pt given dinner  

## 2021-03-17 NOTE — ED Notes (Signed)
Pt sleeping in no acute distress. RR even and unlabored. Safety maintained. 

## 2021-03-17 NOTE — ED Notes (Signed)
D:  Patient A&Ox4. Denies intent to harm self/others when asked. Denies A/VH. Patient denies any physical complaints when asked. No acute distress noted.    A: Pt refuse to attend groups. Encouragement provided. Routine safety checks conducted according to facility protocol. Encouraged patient to notify staff if thoughts of harm toward self or others arise. Patient verbalize understanding and agreement.   R: Patient remains safe and patient verbally contracts for safety at this time. Will continue to monitor.

## 2021-03-17 NOTE — Clinical Social Work Psych Note (Signed)
CSW Update  CSW met with Manjot to discuss any updates or any additional needs or concerns identified over the weekend.   Clarence continues to endorse suicidal ideations.   CSW asked how he was feeling and he stated "the same really". Coty reports he continues to worry about his living situation. He reported he lost his apartment and has no supports in the area for alternative living options. He also reports   Lovell is continues to request residential substance use treatment, however CSW explained that due to his UDS being negative for the last 4 months, he would not qualify for any of the appropriate facilities at this time.  Deaaron reports that his mother, Belenda Cruise stated she knew a facility that would "pick me up and take me straight to rehab". He provided CSW with his mother's number and requested CSW to contact her.    CSW will continue to follow     Radonna Ricker, MSW, LCSW Clinical Social Worker ([place]) [facility]

## 2021-03-17 NOTE — ED Notes (Signed)
Pt in no acute distress. Pleasant with staff. Denies concerns at present. Safety maintained.

## 2021-03-18 DIAGNOSIS — R45851 Suicidal ideations: Secondary | ICD-10-CM | POA: Diagnosis not present

## 2021-03-18 DIAGNOSIS — F339 Major depressive disorder, recurrent, unspecified: Secondary | ICD-10-CM | POA: Diagnosis not present

## 2021-03-18 DIAGNOSIS — Z5900 Homelessness unspecified: Secondary | ICD-10-CM | POA: Diagnosis not present

## 2021-03-18 DIAGNOSIS — Z79899 Other long term (current) drug therapy: Secondary | ICD-10-CM | POA: Diagnosis not present

## 2021-03-18 NOTE — ED Notes (Addendum)
Jared Jordan A&Ox4. Denies intent to harm self/others when asked. Denies A/VH. Endorse hopelessness, worthlessness. Pt states, "I can't believe I let my life turn out like this. I've been homeless all summer. I have literally lost everything, so I will have to start over from scratch. I have no one to blame but myself though. I put myself in bad situations that set me up to fail. Now, I don't know what to do". Jared Jordan denies any physical complaints when asked. Support and encouragement provided. Routine safety checks conducted according to facility protocol. Encouraged Jared Jordan to notify staff if thoughts of harm toward self or others arise. Jared Jordan verbalize understanding and agreement. Jared Jordan verbally contracts for safety. Pt currently on telephone making contact with resources provided by SW. Will continue to monitor.

## 2021-03-18 NOTE — ED Provider Notes (Signed)
[name]/OBS ASAP Discharge Summary  Date and Time: 03/18/2021 5:02 PM  Name: Jared Jordan  MRN:  [id number]   Discharge Diagnoses:  Final diagnoses:  MDD (major depressive disorder), recurrent severe, without psychosis (HCC)    Subjective: Patient seen and chart reviewed. Patient is calm and cooperative. He has been medication compliant. He requests discharge and plans to go his friends home today to stay with them until he is able to go to [employer] rescue mission on Monday. He states that he does not need any medication refills and still has medications from previous admission (no medication changes). Pt denied SI/HI/AVH.   Stay Summary: Patient was admitted to the [facility] on 9/9 for SI. He was restarted on effexor 75 mg and PRN TID vistaril. He tolerated medications well without issue. He attended groups and was appropriate with staff and peers throughout admission. SW assisted with dispo-patient was accepted to [employer] rescue mission on Monday. Patient requested discharge on 9/13 and stated that he was going to live with his friend. See above for day of discharge assessment-he denied SI/HI/AVH.   Total Time spent with patient: 15 minutes  Past Psychiatric History: see H&P Past Medical History:  Past Medical History:  Diagnosis Date   Drug use     Past Surgical History:  Procedure Laterality Date   SHOULDER SURGERY     SHOULDER SURGERY Left 2020   Family History: No family history on file. Family Psychiatric History: see H&P  Social History:  Social History   Substance and Sexual Activity  Alcohol Use Yes   Alcohol/week: 4.0 - 6.0 standard drinks   Types: 4 - 6 Cans of beer per week   Comment: 2-3 times weekly     Social History   Substance and Sexual Activity  Drug Use Not Currently   Types: IV, Methamphetamines   Comment: using 1gm everyday heroin    Social History   Socioeconomic History   Marital status: Single    Spouse name: Not on file   Number of  children: Not on file   Years of education: Not on file   Highest education level: Not on file  Occupational History   Not on file  Tobacco Use   Smoking status: Every Day    Packs/day: 0.25    Years: 1.00    Pack years: 0.25    Types: Cigarettes   Smokeless tobacco: Never  Vaping Use   Vaping Use: Some days   Start date: 11/04/2018   Devices: Jewel Menthol  Substance and Sexual Activity   Alcohol use: Yes    Alcohol/week: 4.0 - 6.0 standard drinks    Types: 4 - 6 Cans of beer per week    Comment: 2-3 times weekly   Drug use: Not Currently    Types: IV, Methamphetamines    Comment: using 1gm everyday heroin   Sexual activity: Yes  Other Topics Concern   Not on file  Social History Narrative   Not on file   Social Determinants of Health   Financial Resource Strain: Not on file  Food Insecurity: Not on file  Transportation Needs: Not on file  Physical Activity: Not on file  Stress: Not on file  Social Connections: Not on file   SDOH:  SDOH Screenings   Alcohol Screen: Low Risk    Last Alcohol Screening Score (AUDIT): 7  Depression (PHQ2-9): Medium Risk   PHQ-2 Score: 26  Financial Resource Strain: Not on file  Food Insecurity: Not on file  Housing: Not on file  Physical Activity: Not on file  Social Connections: Not on file  Stress: Not on file  Tobacco Use: High Risk   Smoking Tobacco Use: Every Day   Smokeless Tobacco Use: Never  Transportation Needs: Not on file    Tobacco Cessation:  Prescription not provided because: n/a  Current Medications:  Current Facility-Administered Medications  Medication Dose Route Frequency Provider Last Rate Last Admin   acetaminophen (TYLENOL) tablet 650 mg  650 mg Oral Q6H PRN Estella Husk, MD       alum & mag hydroxide-simeth (MAALOX/MYLANTA) 200-200-20 MG/5ML suspension 30 mL  30 mL Oral Q4H PRN Estella Husk, MD       hydrOXYzine (ATARAX/VISTARIL) tablet 25 mg  25 mg Oral TID PRN Estella Husk, MD    25 mg at 03/17/21 1810   magnesium hydroxide (MILK OF MAGNESIA) suspension 30 mL  30 mL Oral Daily PRN Estella Husk, MD       traZODone (DESYREL) tablet 50 mg  50 mg Oral QHS PRN Estella Husk, MD       venlafaxine XR (EFFEXOR-XR) 24 hr capsule 75 mg  75 mg Oral Q breakfast Estella Husk, MD   75 mg at 03/18/21 0920   Current Outpatient Medications  Medication Sig Dispense Refill   hydrOXYzine (ATARAX/VISTARIL) 25 MG tablet Take 1 tablet (25 mg total) by mouth 3 (three) times daily as needed for anxiety. (Patient not taking: Reported on 03/14/2021) 90 tablet 1   traZODone (DESYREL) 50 MG tablet Take 1 tablet (50 mg total) by mouth at bedtime as needed for sleep. (Patient not taking: Reported on 03/14/2021) 30 tablet 1   venlafaxine XR (EFFEXOR-XR) 75 MG 24 hr capsule Take 1 capsule (75 mg total) by mouth daily with breakfast. (Patient not taking: Reported on 03/14/2021) 30 capsule 1    PTA Medications: (Not in a hospital admission)   Musculoskeletal  Strength & Muscle Tone: within normal limits Gait & Station: normal Patient leans: N/A  Psychiatric Specialty Exam  Presentation  General Appearance: Appropriate for Environment; Casual  Eye Contact:Fair  Speech:Clear and Coherent; Normal Rate  Speech Volume:Normal  Handedness:Right   Mood and Affect  Mood:Euthymic  Affect:Appropriate; Congruent   Thought Process  Thought Processes:Coherent  Descriptions of Associations:Intact  Orientation:Full (Time, Place and Person)  Thought Content:WDL; Logical  Diagnosis of Schizophrenia or Schizoaffective disorder in past: No    Hallucinations:Hallucinations: None Ideas of Reference:None  Suicidal Thoughts:Suicidal Thoughts: No Homicidal Thoughts:Homicidal Thoughts: No  Sensorium  Memory:Immediate Good; Recent Good; Remote Good  Judgment:Fair  Insight:Fair   Executive Functions  Concentration:Good  Attention Span:Good  Recall:Good  Fund of  Knowledge:Good  Language:Good   Psychomotor Activity  Psychomotor Activity: Psychomotor Activity: Normal  Assets  Assets:Communication Skills; Desire for Improvement; Leisure Time; Social Support   Sleep  Sleep: Sleep: Fair  No data recorded  Physical Exam  See discharge summary and ROS for physical exam   Blood pressure (!) 130/93, pulse 88, temperature 98.2 F (36.8 C), temperature source Oral, resp. rate 18, SpO2 100 %. There is no height or weight on file to calculate BMI.  See SRA for full suicide risk assessment  Disposition: self care   Estella Husk, MD 03/18/2021, 5:02 PM

## 2021-03-18 NOTE — Clinical Social Work Psych Note (Signed)
CSW Update   CSW met with patient to determine if there were any updates or additional needs identified.  Abshir reports he felt "okay" today. He denied having any SI, HI, AVH at this time. He reports he is "desperate" to find a transitional living program or treatment at discharge.   CSW provided Soren the referral for [place] completed the referral and CSW faxed it to [facility] for review. Jadriel reports he attempted to call to follow up and complete a phone screening, however there was no answer at this time.   CSW offered Clinical research associate program information and a list of [employer] as alternative options.    CSW will continue to follow   Radonna Ricker, MSW, LCSW Clinical Social Worker ([place]) [facility]

## 2021-03-18 NOTE — Group Note (Unsigned)
Group Topic: Overcoming Obstacles  Group Date: 03/18/2021 Start Time: 1000 End Time: 1030 Facilitators: Liam Rogers  Department: [facility]  Number of Participants: 4  Group Focus: goals/reality orientation Treatment Modality:  Skills Training Interventions utilized were group exercise Purpose: improve communication skills   Today on 03/18/21 pts were instructed to talk about goals their personal individual goals that they had for themselves. Each pt revealed each goal that they had for the day or week and also revealed some barriers that would stop them frrom completing their goals. Also we talked in the group what person, place, or thing that they could reach out to if they needed help with their goals.   Name: Jared Jordan Date of Birth: 07-09-1990  MR: [id number]    Level of Participation: {THERAPIES; PSYCH GROUP PARTICIPATION QASTM:19622} Quality of Participation: {THERAPIES; PSYCH QUALITY OF PARTICIPATION:23992} Interactions with others: {THERAPIES; PSYCH INTERACTIONS:23993} Mood/Affect: {THERAPIES; PSYCH MOOD/AFFECT:23994} Triggers (if applicable): *** Cognition: {THERAPIES; PSYCH COGNITION:23995} Progress: {THERAPIES; PSYCH PROGRESS:23997} Response: *** Plan: {THERAPIES; PSYCH WLNL:89211}  Patients Problems:  Patient Active Problem List   Diagnosis Date Noted   MDD (major depressive disorder), recurrent episode (HCC) 03/14/2021   MDD (major depressive disorder), recurrent severe, without psychosis (HCC) 02/08/2021   MDD (major depressive disorder), recurrent episode, severe (HCC) 11/30/2020   Major depressive disorder, recurrent severe without psychotic features (HCC)    Substance induced mood disorder (HCC) 11/22/2019

## 2021-03-18 NOTE — Discharge Instructions (Signed)

## 2021-03-18 NOTE — ED Notes (Signed)
Patient A&O x 4, ambulatory. Patient discharged in no acute distress. Patient denied SI/HI, A/VH upon discharge. Patient verbalized understanding of all discharge instructions explained by staff, to include follow up appointments, medications and safety plan. Patient reported mood 10/10.  Pt belongings returned to patient from locker #29      intact. Patient escorted to lobby via staff for transport to destination. Safety maintained.

## 2021-03-18 NOTE — Clinical Social Work Psych Note (Signed)
Emotional Reg & Skills  Emotional Regulation & Emotional Regulation Skills  Date: 03/18/21  Type of Therapy/Therapeutic Modalities: Processing Group, Motivational Interviewing, Socratic Questioning, Psycho-Education, CBT  Participation Level: Did Not Attend  Objective: The purpose of this group is to discuss with patients the importance of emotional regulations skills in every day life and how those skills assist them in maintaining or achieving overall well-being in all dimensions of life.  Therapeutic Goals:  Patient will identify emotions they struggle managing and will reflect on the triggering factors and the behavioral responses associated with them.  Patient will review emotional regulation techniques that will enhance their ability to manage their emotions more appropriately.  Patient will discuss with group members their findings and how they plan to implement emotional regulation techniques in their road to recovery.   Summary of Patient's Progress:  Patient did not attend the group session.  

## 2021-03-18 NOTE — ED Notes (Signed)
Pt asleep in bed. Respirations even and unlabored. Will continue to monitor for safety. ?

## 2021-03-18 NOTE — ED Provider Notes (Signed)
[facility] Discharge Suicide Risk Assessment   Principal Problem: MDD (major depressive disorder), recurrent episode (HCC) Discharge Diagnoses: Principal Problem:   MDD (major depressive disorder), recurrent episode (HCC)   Total Time spent with patient: 15 minutes  Musculoskeletal: Strength & Muscle Tone: within normal limits Gait & Station: normal Patient leans: N/A  Psychiatric Specialty Exam  Presentation  General Appearance: Appropriate for Environment  Eye Contact:Fair  Speech:Clear and Coherent; Normal Rate  Speech Volume:Normal  Handedness:Right   Mood and Affect  Mood:Depressed  Duration of Depression Symptoms: Greater than two weeks  Affect:Congruent   Thought Process  Thought Processes:Coherent  Descriptions of Associations:Intact  Orientation:None  Thought Content:Logical  History of Schizophrenia/Schizoaffective disorder:No  Duration of Psychotic Symptoms:No data recorded Hallucinations:No data recorded Ideas of Reference:None  Suicidal Thoughts:No data recorded Homicidal Thoughts:No data recorded  Sensorium  Memory:Immediate Good; Recent Good; Remote Good  Judgment:Fair  Insight:Fair   Executive Functions  Concentration:Good  Attention Span:Good  Recall:Good  Fund of Knowledge:Good  Language:Good   Psychomotor Activity  Psychomotor Activity: No data recorded  Assets  Assets:Communication Skills; Desire for Improvement; Leisure Time; Physical Health; Resilience   Sleep  Sleep: No data recorded  Physical Exam: Physical Exam Constitutional:      Appearance: Normal appearance. He is normal weight.  HENT:     Head: Normocephalic and atraumatic.  Eyes:     Extraocular Movements: Extraocular movements intact.  Pulmonary:     Effort: Pulmonary effort is normal.  Neurological:     General: No focal deficit present.     Mental Status: He is alert.   Review of Systems  Constitutional:  Negative for chills and fever.   HENT:  Negative for hearing loss.   Eyes:  Negative for discharge and redness.  Respiratory:  Negative for cough.   Cardiovascular:  Negative for chest pain.  Gastrointestinal:  Negative for abdominal pain.  Musculoskeletal:  Negative for myalgias.  Neurological:  Negative for headaches.  Psychiatric/Behavioral:  Negative for hallucinations and suicidal ideas.   Blood pressure (!) 130/93, pulse 88, temperature 98.2 F (36.8 C), temperature source Oral, resp. rate 18, SpO2 100 %. There is no height or weight on file to calculate BMI.  Mental Status Per Nursing Assessment::   On Admission:     Demographic Factors:  Male, Caucasian, Low socioeconomic status, and Unemployed  Loss Factors: Financial problems/change in socioeconomic status  Historical Factors: Impulsivity  Risk Reduction Factors:   Sense of responsibility to family and Positive social support  Continued Clinical Symptoms:  Depression: Alcohol/Substance Abuse/Dependencies Previous Psychiatric Diagnoses and Treatments  Cognitive Features That Contribute To Risk:  None    Suicide Risk:  Minimal: No identifiable suicidal ideation.  Patients presenting with no risk factors but with morbid ruminations; may be classified as minimal risk based on the severity of the depressive symptoms   Follow-up Information     [facility] Go to.   Specialty: Behavioral Health Why: Please go during walk-in hours to establish outapatient medication management and therapy services.   Medication Managment Walk-In hours  Monday-Friday from 8:00am-11:00am. Please arrive between 7:30am-7:45am, as patients are seen on a first come, first served basis.   Therapy Walk-in Hours  Monday-Wednesday from 8:00am-until slots are full. Please arrive between 7:30am-7:45am, as patients are seen on a first come, first served basis.   Fridays, from 1:00pm-5:00pm. Contact information: [address] [place]  [place] [address] [phone]        [facility]. Call.   Why:  Please contact once you arrive to [facility] to establish outpatient medication management and therapy services. Please be sure to provide any discharge paperwork, inlcuding a list of medications. Contact information: [address] [place] [address] [phone]                 Plan Of Care/Follow-up recommendations:  Activity:  as tolerated Diet:  regular Other:     Patient is instructed prior to discharge to: Take all medications as prescribed by his/her mental healthcare provider. Report any adverse effects and or reactions from the medicines to his/her outpatient provider promptly. Patient has been instructed & cautioned: To not engage in alcohol and or illegal drug use while on prescription medicines. In the event of worsening symptoms, patient is instructed to call the crisis hotline, 911 and or go to the nearest ED for appropriate evaluation and treatment of symptoms. To follow-up with his/her primary care provider for your other medical issues, concerns and or health care needs.      Estella Husk, MD 03/18/2021, 3:21 PM

## 2021-03-18 NOTE — ED Notes (Signed)
Pt sitting in dining room eating breakfast. No acute distress noted. Safety maintained.

## 2021-03-18 NOTE — ED Notes (Signed)
Pt had lunch 

## 2021-03-18 NOTE — ED Notes (Signed)
Pt had breakfast. 

## 2021-03-19 ENCOUNTER — Emergency Department (HOSPITAL_COMMUNITY): Payer: Self-pay

## 2021-03-19 ENCOUNTER — Other Ambulatory Visit: Payer: Self-pay

## 2021-03-19 ENCOUNTER — Emergency Department (HOSPITAL_COMMUNITY)
Admission: EM | Admit: 2021-03-19 | Discharge: 2021-03-20 | Disposition: A | Discharge status: Home / Self Care | Payer: Self-pay | Attending: Emergency Medicine | Admitting: Emergency Medicine

## 2021-03-19 DIAGNOSIS — T401X2A Poisoning by heroin, intentional self-harm, initial encounter: Secondary | ICD-10-CM | POA: Insufficient documentation

## 2021-03-19 DIAGNOSIS — W228XXA Striking against or struck by other objects, initial encounter: Secondary | ICD-10-CM | POA: Insufficient documentation

## 2021-03-19 DIAGNOSIS — Y900 Blood alcohol level of less than 20 mg/100 ml: Secondary | ICD-10-CM | POA: Insufficient documentation

## 2021-03-19 DIAGNOSIS — F17210 Nicotine dependence, cigarettes, uncomplicated: Secondary | ICD-10-CM | POA: Insufficient documentation

## 2021-03-19 DIAGNOSIS — T50904D Poisoning by unspecified drugs, medicaments and biological substances, undetermined, subsequent encounter: Secondary | ICD-10-CM

## 2021-03-19 DIAGNOSIS — Z23 Encounter for immunization: Secondary | ICD-10-CM | POA: Insufficient documentation

## 2021-03-19 DIAGNOSIS — R7309 Other abnormal glucose: Secondary | ICD-10-CM | POA: Insufficient documentation

## 2021-03-19 DIAGNOSIS — R45851 Suicidal ideations: Secondary | ICD-10-CM

## 2021-03-19 DIAGNOSIS — S0101XA Laceration without foreign body of scalp, initial encounter: Secondary | ICD-10-CM | POA: Insufficient documentation

## 2021-03-19 DIAGNOSIS — Y92002 Bathroom of unspecified non-institutional (private) residence single-family (private) house as the place of occurrence of the external cause: Secondary | ICD-10-CM | POA: Insufficient documentation

## 2021-03-19 LAB — ACETAMINOPHEN LEVEL: Acetaminophen (Tylenol), Serum: <10 ug/mL — ABNORMAL LOW (ref 10–30)

## 2021-03-19 LAB — ETHANOL: Alcohol, Ethyl (B): <10 mg/dL (ref <10)

## 2021-03-19 LAB — COMPREHENSIVE METABOLIC PANEL
ALT: 35 U/L (ref 0–44)
AST: 30 U/L (ref 15–41)
Albumin: 3.8 g/dL (ref 3.5–5.0)
Alkaline Phosphatase: 75 U/L (ref 38–126)
Anion gap: 7 (ref 5–15)
BUN: 16 mg/dL (ref 6–20)
CO2: 29 mmol/L (ref 22–32)
Calcium: 9.1 mg/dL (ref 8.9–10.3)
Chloride: 102 mmol/L (ref 98–111)
Creatinine, Ser: 1.05 mg/dL (ref 0.61–1.24)
GFR, Estimated: >60 mL/min (ref >60)
Glucose, Bld: 141 mg/dL — ABNORMAL HIGH (ref 70–99)
Potassium: 4 mmol/L (ref 3.5–5.1)
Sodium: 138 mmol/L (ref 135–145)
Total Bilirubin: 0.8 mg/dL (ref 0.3–1.2)
Total Protein: 6.4 g/dL — ABNORMAL LOW (ref 6.5–8.1)

## 2021-03-19 LAB — CBC
HCT: 38.6 % — ABNORMAL LOW (ref 39.0–52.0)
Hemoglobin: 12.4 g/dL — ABNORMAL LOW (ref 13.0–17.0)
MCH: 28.7 pg (ref 26.0–34.0)
MCHC: 32.1 g/dL (ref 30.0–36.0)
MCV: 89.4 fL (ref 80.0–100.0)
Platelets: 220 10*3/uL (ref 150–400)
RBC: 4.32 MIL/uL (ref 4.22–5.81)
RDW: 13.4 % (ref 11.5–15.5)
WBC: 7.3 10*3/uL (ref 4.0–10.5)
nRBC: 0 % (ref 0.0–0.2)

## 2021-03-19 LAB — CBG MONITORING, ED: Glucose-Capillary: 95 mg/dL (ref 70–99)

## 2021-03-19 LAB — SALICYLATE LEVEL: Salicylate Lvl: <7 mg/dL — ABNORMAL LOW (ref 7.0–30.0)

## 2021-03-19 MED ORDER — TETANUS-DIPHTH-ACELL PERTUSSIS 5-2.5-18.5 LF-MCG/0.5 IM SUSY
0.5000 mL | PREFILLED_SYRINGE | Freq: Once | INTRAMUSCULAR | Status: AC
  Administered 2021-03-19: 0.5 mL via INTRAMUSCULAR
  Filled 2021-03-19: qty 0.5

## 2021-03-19 NOTE — ED Notes (Signed)
Received verbal report Chris C. RN at this time °

## 2021-03-19 NOTE — ED Triage Notes (Signed)
Pt was found unresponsive at [employer] [facility], he was given 1mg  narcan and became responsive, pt hit head on bathroom floor has left abrasion on cheek and laceration above right eye, pt endorses heroin use and wants to get help, pt

## 2021-03-19 NOTE — ED Provider Notes (Signed)
[facility] EMERGENCY DEPARTMENT Provider Note   CSN: [id number] Arrival date & time: 03/19/21  1801     History Chief Complaint  Patient presents with   Drug Overdose    Jared Jordan is a 30 y.o. male.  With past medical history of MDD, polysubstance abuse, previous drug overdose, suicidal attempt who presents to the emergency department after overdose today.  Per EMS patient was found unresponsive at a [employer].  He was given 1 mg of Narcan and became responsive.  At some point during event patient hit his head on the bathroom floor.  Patient states that he was injecting heroin into his forearms.  He is unsure if there was fentanyl mixed in.  States that a friend was with him and attempted chest compressions.  Do not think there was ever loss of pulses.  On interview the patient is very forthcoming, honest about past medical history of drug overdoses and suicidal attempts.  He states that he was injecting drugs to get high; however, states he would not be mad if he had a fatal overdose.  States that he feels "emotionally overwhelmed."  States that he has had a "hard time this summer.  Not doing what he needs to do."  He also states that "he missed the deadline to apply to go back to school."  Because of this he relapsed today.  States that he was sober for 2 weeks prior to this.  Additionally he states he is attempted to enroll in rehab, however states that there is a 90-day return policy and he has not been able to re-enroll.   Drug Overdose Associated symptoms include headaches. Pertinent negatives include no chest pain and no shortness of breath.      Past Medical History:  Diagnosis Date   Drug use     Patient Active Problem List   Diagnosis Date Noted   MDD (major depressive disorder), recurrent episode (HCC) 03/14/2021   MDD (major depressive disorder), recurrent severe, without psychosis (HCC) 02/08/2021   MDD (major depressive disorder), recurrent  episode, severe (HCC) 11/30/2020   Major depressive disorder, recurrent severe without psychotic features (HCC)    Substance induced mood disorder (HCC) 11/22/2019    Past Surgical History:  Procedure Laterality Date   SHOULDER SURGERY     SHOULDER SURGERY Left 2020       No family history on file.  Social History   Tobacco Use   Smoking status: Every Day    Packs/day: 0.25    Years: 1.00    Pack years: 0.25    Types: Cigarettes   Smokeless tobacco: Never  Vaping Use   Vaping Use: Some days   Start date: 11/04/2018   Devices: Jewel Menthol  Substance Use Topics   Alcohol use: Yes    Alcohol/week: 4.0 - 6.0 standard drinks    Types: 4 - 6 Cans of beer per week    Comment: 2-3 times weekly   Drug use: Not Currently    Types: IV, Methamphetamines    Comment: using 1gm everyday heroin    Home Medications Prior to Admission medications   Medication Sig Start Date End Date Taking? Authorizing Provider  hydrOXYzine (ATARAX/VISTARIL) 25 MG tablet Take 1 tablet (25 mg total) by mouth 3 (three) times daily as needed for anxiety. Patient not taking: Reported on 03/14/2021 02/17/21   Estella Husk, MD  traZODone (DESYREL) 50 MG tablet Take 1 tablet (50 mg total) by mouth at bedtime as needed for  sleep. Patient not taking: Reported on 03/14/2021 02/17/21   Estella Husk, MD  venlafaxine XR (EFFEXOR-XR) 75 MG 24 hr capsule Take 1 capsule (75 mg total) by mouth daily with breakfast. Patient not taking: Reported on 03/14/2021 02/18/21   Estella Husk, MD    Allergies    Patient has no known allergies.  Review of Systems   Review of Systems  Respiratory:  Negative for shortness of breath.   Cardiovascular:  Negative for chest pain.  Neurological:  Positive for headaches.  Psychiatric/Behavioral:  Positive for suicidal ideas. Negative for agitation, confusion and hallucinations. The patient is not nervous/anxious.   All other systems reviewed and are  negative.  Physical Exam Updated Vital Signs BP (!) 154/98 (BP Location: Right Arm)   Pulse 85   Temp 98.3 F (36.8 C) (Oral)   Resp 13   Ht 5\' 10"  (1.778 m)   Wt 72.6 kg   SpO2 98%   BMI 22.96 kg/m   Physical Exam Vitals and nursing note reviewed.  Constitutional:      General: He is not in acute distress.    Appearance: Normal appearance. He is normal weight. He is not diaphoretic.  HENT:     Head: Normocephalic and atraumatic.     Nose: Nose normal.     Mouth/Throat:     Mouth: Mucous membranes are moist.     Pharynx: Oropharynx is clear.  Eyes:     General: No scleral icterus.    Extraocular Movements: Extraocular movements intact.     Pupils: Pupils are equal, round, and reactive to light.  Cardiovascular:     Rate and Rhythm: Normal rate and regular rhythm.     Pulses: Normal pulses.     Heart sounds: Normal heart sounds. No murmur heard. Pulmonary:     Effort: Pulmonary effort is normal. No respiratory distress.     Breath sounds: Normal breath sounds.  Abdominal:     General: Abdomen is flat. Bowel sounds are normal.     Palpations: Abdomen is soft.     Tenderness: There is no abdominal tenderness.  Musculoskeletal:        General: Normal range of motion.     Cervical back: No tenderness.  Skin:    General: Skin is warm and dry.     Capillary Refill: Capillary refill takes less than 2 seconds.     Findings: Laceration present.          Comments: 2cm superficial laceration above the right eyebrow   Neurological:     General: No focal deficit present.     Mental Status: He is alert and oriented to person, place, and time. Mental status is at baseline.     Cranial Nerves: No cranial nerve deficit.  Psychiatric:        Attention and Perception: Attention and perception normal. He does not perceive auditory or visual hallucinations.        Mood and Affect: Mood is depressed. Affect is tearful.        Speech: Speech normal.        Behavior: Behavior  normal. Behavior is cooperative.        Thought Content: Thought content includes suicidal ideation. Thought content does not include homicidal ideation.        Cognition and Memory: Cognition and memory normal.        Judgment: Judgment is impulsive.    ED Results / Procedures / Treatments   Labs (all labs ordered  are listed, but only abnormal results are displayed) Labs Reviewed  COMPREHENSIVE METABOLIC PANEL - Abnormal; Notable for the following components:      Result Value   Glucose, Bld 141 (*)    Total Protein 6.4 (*)    All other components within normal limits  SALICYLATE LEVEL - Abnormal; Notable for the following components:   Salicylate Lvl <7.0 (*)    All other components within normal limits  ACETAMINOPHEN LEVEL - Abnormal; Notable for the following components:   Acetaminophen (Tylenol), Serum <10 (*)    All other components within normal limits  CBC - Abnormal; Notable for the following components:   Hemoglobin 12.4 (*)    HCT 38.6 (*)    All other components within normal limits  RESP PANEL BY RT-PCR (FLU A&B, COVID) ARPGX2  ETHANOL  RAPID URINE DRUG SCREEN, HOSP PERFORMED  CBG MONITORING, ED    EKG EKG Interpretation  Date/Time:  Wednesday March 19 2021 18:08:07 EDT Ventricular Rate:  83 PR Interval:  173 QRS Duration: 78 QT Interval:  378 QTC Calculation: 445 R Axis:   72 Text Interpretation: Sinus rhythm LVH by voltage When compared to prior, similar appearance. No STEMI Confirmed by Theda Belfast ([id number]) on 03/19/2021 6:21:27 PM  Radiology CT Head Wo Contrast  Result Date: 03/19/2021 CLINICAL DATA:  Head/neck trauma EXAM: CT HEAD WITHOUT CONTRAST CT CERVICAL SPINE WITHOUT CONTRAST TECHNIQUE: Multidetector CT imaging of the head and cervical spine was performed following the standard protocol without intravenous contrast. Multiplanar CT image reconstructions of the cervical spine were also generated. COMPARISON:  None. FINDINGS: CT HEAD FINDINGS  Brain: No evidence of acute infarction, hemorrhage, hydrocephalus, extra-axial collection or mass lesion/mass effect. Vascular: No hyperdense vessel or unexpected calcification. Skull: Normal. Negative for fracture or focal lesion. Sinuses/Orbits: The visualized paranasal sinuses are essentially clear. The mastoid air cells are unopacified. Other: None. CT CERVICAL SPINE FINDINGS Alignment: Normal cervical lordosis. Skull base and vertebrae: No acute fracture. No primary bone lesion or focal pathologic process. Soft tissues and spinal canal: No prevertebral fluid or swelling. No visible canal hematoma. Disc levels:  Intervertebral disc spaces are maintained. Upper chest: Visualized lung apices are clear. Other: Thyroid is unremarkable. IMPRESSION: Normal head CT. Normal cervical spine CT. Electronically Signed   By: Charline Bills M.D.   On: 03/19/2021 19:09   CT Cervical Spine Wo Contrast  Result Date: 03/19/2021 CLINICAL DATA:  Head/neck trauma EXAM: CT HEAD WITHOUT CONTRAST CT CERVICAL SPINE WITHOUT CONTRAST TECHNIQUE: Multidetector CT imaging of the head and cervical spine was performed following the standard protocol without intravenous contrast. Multiplanar CT image reconstructions of the cervical spine were also generated. COMPARISON:  None. FINDINGS: CT HEAD FINDINGS Brain: No evidence of acute infarction, hemorrhage, hydrocephalus, extra-axial collection or mass lesion/mass effect. Vascular: No hyperdense vessel or unexpected calcification. Skull: Normal. Negative for fracture or focal lesion. Sinuses/Orbits: The visualized paranasal sinuses are essentially clear. The mastoid air cells are unopacified. Other: None. CT CERVICAL SPINE FINDINGS Alignment: Normal cervical lordosis. Skull base and vertebrae: No acute fracture. No primary bone lesion or focal pathologic process. Soft tissues and spinal canal: No prevertebral fluid or swelling. No visible canal hematoma. Disc levels:  Intervertebral disc  spaces are maintained. Upper chest: Visualized lung apices are clear. Other: Thyroid is unremarkable. IMPRESSION: Normal head CT. Normal cervical spine CT. Electronically Signed   By: Charline Bills M.D.   On: 03/19/2021 19:09    Procedures .Marland KitchenLaceration Repair  Date/Time: 03/19/2021 9:15 PM Performed by:  Cristopher Peru, PA-C Authorized by: Cristopher Peru, PA-C   Consent:    Consent obtained:  Verbal   Consent given by:  Patient   Risks discussed:  Infection and pain   Alternatives discussed:  No treatment Anesthesia:    Anesthesia method:  None Laceration details:    Location:  Scalp   Scalp location:  Frontal   Length (cm):  2   Depth (mm):  0.5 Treatment:    Area cleansed with:  Povidone-iodine and saline   Amount of cleaning:  Standard   Debridement:  None   Undermining:  None Skin repair:    Repair method:  Tissue adhesive (Dermabond) Approximation:    Approximation:  Close Post-procedure details:    Dressing:  Open (no dressing)   Procedure completion:  Tolerated   Medications Ordered in ED Medications  Tdap (BOOSTRIX) injection 0.5 mL (0.5 mLs Intramuscular Given 03/19/21 2036)    ED Course  I have reviewed the triage vital signs and the nursing notes.  Pertinent labs & imaging results that were available during my care of the patient were reviewed by me and considered in my medical decision making (see chart for details).    MDM Rules/Calculators/A&P Ericberto Lebleu is a 30 year old male with past medical history of depression, suicidal attempts, polysubstance abuse presents emergency department after drug overdose.  Per patient injected heroin. UDS pending Acetaminophen, salicylates, ethanol negative  Required 1 mg of Narcan per EMS.  Has not required any further reversal.  Maintains his own airway.  Alert and oriented. Initially in c-collar, unable to clear him with Nexus criteria.  Obtain CT head and C-spine.  Both imaging negative.  Cleared  c-collar after this. Small laceration to the right eyebrow.  See procedural details.  Tetanus updated.  Regarding suicidal ideations, patient is very insightful and forthcoming with mental health struggles.  He seems to have more passive suicidal ideations when asked.  He is very open to speaking with psych. Medically cleared and TTS orders placed. Spoke with the patient regarding the TTS process.  He is agreeable to the plan. Final Clinical Impression(s) / ED Diagnoses Final diagnoses:  Drug overdose, undetermined intent, subsequent encounter    Rx / DC Orders ED Discharge Orders     None        Cristopher Peru, PA-C 03/19/21 2121    Tegeler, Canary Brim, MD 03/19/21 2337

## 2021-03-20 ENCOUNTER — Encounter (HOSPITAL_COMMUNITY): Payer: Self-pay | Admitting: Emergency Medicine

## 2021-03-20 ENCOUNTER — Emergency Department (HOSPITAL_COMMUNITY)
Admission: EM | Admit: 2021-03-20 | Discharge: 2021-03-20 | Disposition: A | Discharge status: Home / Self Care | Payer: Medicaid Other | Attending: Emergency Medicine | Admitting: Emergency Medicine

## 2021-03-20 ENCOUNTER — Other Ambulatory Visit: Payer: Self-pay

## 2021-03-20 DIAGNOSIS — F430 Acute stress reaction: Secondary | ICD-10-CM

## 2021-03-20 DIAGNOSIS — Z5900 Homelessness unspecified: Secondary | ICD-10-CM | POA: Insufficient documentation

## 2021-03-20 DIAGNOSIS — Z659 Problem related to unspecified psychosocial circumstances: Secondary | ICD-10-CM

## 2021-03-20 DIAGNOSIS — T401X1A Poisoning by heroin, accidental (unintentional), initial encounter: Secondary | ICD-10-CM | POA: Insufficient documentation

## 2021-03-20 MED ORDER — NALOXONE HCL 4 MG/0.1ML NA LIQD: 1.0000 | Freq: Once | NASAL | Status: DC | PRN

## 2021-03-20 NOTE — ED Provider Notes (Signed)
[facility] EMERGENCY DEPARTMENT Provider Note   CSN: [id number] Arrival date & time: 03/19/21  1801     History Chief Complaint  Patient presents with   Drug Overdose    Beren Yniguez is a 30 y.o. male.  The history is provided by the patient and medical records.  Drug Overdose  30 y.o. M here with SI.  He was here earlier after intentional OD and received narcan by EMS.  He was medically cleared after full evaluation and was waiting for TTS assessment when he apparently eloped without telling staff that he was leaving.  He reports he was at the vending machine, however he was not in the waiting room and was gone from this campus for approx 1.5 hours.  He denies using drugs or other substances during this time period.  Past Medical History:  Diagnosis Date   Drug use     Patient Active Problem List   Diagnosis Date Noted   MDD (major depressive disorder), recurrent episode (HCC) 03/14/2021   MDD (major depressive disorder), recurrent severe, without psychosis (HCC) 02/08/2021   MDD (major depressive disorder), recurrent episode, severe (HCC) 11/30/2020   Major depressive disorder, recurrent severe without psychotic features (HCC)    Substance induced mood disorder (HCC) 11/22/2019    Past Surgical History:  Procedure Laterality Date   SHOULDER SURGERY     SHOULDER SURGERY Left 2020       No family history on file.  Social History   Tobacco Use   Smoking status: Every Day    Packs/day: 0.25    Years: 1.00    Pack years: 0.25    Types: Cigarettes   Smokeless tobacco: Never  Vaping Use   Vaping Use: Some days   Start date: 11/04/2018   Devices: Jewel Menthol  Substance Use Topics   Alcohol use: Yes    Alcohol/week: 4.0 - 6.0 standard drinks    Types: 4 - 6 Cans of beer per week    Comment: 2-3 times weekly   Drug use: Not Currently    Types: IV, Methamphetamines    Comment: using 1gm everyday heroin    Home Medications Prior to  Admission medications   Medication Sig Start Date End Date Taking? Authorizing Provider  hydrOXYzine (ATARAX/VISTARIL) 25 MG tablet Take 1 tablet (25 mg total) by mouth 3 (three) times daily as needed for anxiety. Patient not taking: Reported on 03/14/2021 02/17/21   Estella Husk, MD  traZODone (DESYREL) 50 MG tablet Take 1 tablet (50 mg total) by mouth at bedtime as needed for sleep. Patient not taking: Reported on 03/14/2021 02/17/21   Estella Husk, MD  venlafaxine XR (EFFEXOR-XR) 75 MG 24 hr capsule Take 1 capsule (75 mg total) by mouth daily with breakfast. Patient not taking: Reported on 03/14/2021 02/18/21   Estella Husk, MD    Allergies    Patient has no known allergies.  Review of Systems   Review of Systems  Psychiatric/Behavioral:  Positive for suicidal ideas.   All other systems reviewed and are negative.  Physical Exam Updated Vital Signs BP 119/72   Pulse 76   Temp 98.3 F (36.8 C) (Oral)   Resp 13   Ht 5\' 10"  (1.778 m)   Wt 72.6 kg   SpO2 97%   BMI 22.96 kg/m   Physical Exam Vitals and nursing note reviewed.  Constitutional:      Appearance: He is well-developed.  HENT:     Head: Normocephalic and  atraumatic.  Eyes:     Conjunctiva/sclera: Conjunctivae normal.     Pupils: Pupils are equal, round, and reactive to light.  Cardiovascular:     Rate and Rhythm: Normal rate and regular rhythm.     Heart sounds: Normal heart sounds.  Pulmonary:     Effort: Pulmonary effort is normal.     Breath sounds: Normal breath sounds.  Abdominal:     General: Bowel sounds are normal.     Palpations: Abdomen is soft.  Musculoskeletal:        General: Normal range of motion.     Cervical back: Normal range of motion.  Skin:    General: Skin is warm and dry.  Neurological:     Mental Status: He is alert and oriented to person, place, and time.  Psychiatric:        Thought Content: Thought content includes suicidal ideation. Thought content includes  suicidal plan.    ED Results / Procedures / Treatments   Labs (all labs ordered are listed, but only abnormal results are displayed) Labs Reviewed  COMPREHENSIVE METABOLIC PANEL - Abnormal; Notable for the following components:      Result Value   Glucose, Bld 141 (*)    Total Protein 6.4 (*)    All other components within normal limits  SALICYLATE LEVEL - Abnormal; Notable for the following components:   Salicylate Lvl <7.0 (*)    All other components within normal limits  ACETAMINOPHEN LEVEL - Abnormal; Notable for the following components:   Acetaminophen (Tylenol), Serum <10 (*)    All other components within normal limits  CBC - Abnormal; Notable for the following components:   Hemoglobin 12.4 (*)    HCT 38.6 (*)    All other components within normal limits  RESP PANEL BY RT-PCR (FLU A&B, COVID) ARPGX2  ETHANOL  RAPID URINE DRUG SCREEN, HOSP PERFORMED  CBG MONITORING, ED    EKG EKG Interpretation  Date/Time:  Wednesday March 19 2021 18:08:07 EDT Ventricular Rate:  83 PR Interval:  173 QRS Duration: 78 QT Interval:  378 QTC Calculation: 445 R Axis:   72 Text Interpretation: Sinus rhythm LVH by voltage When compared to prior, similar appearance. No STEMI Confirmed by Theda Belfast ([id number]) on 03/19/2021 6:21:27 PM  Radiology CT Head Wo Contrast  Result Date: 03/19/2021 CLINICAL DATA:  Head/neck trauma EXAM: CT HEAD WITHOUT CONTRAST CT CERVICAL SPINE WITHOUT CONTRAST TECHNIQUE: Multidetector CT imaging of the head and cervical spine was performed following the standard protocol without intravenous contrast. Multiplanar CT image reconstructions of the cervical spine were also generated. COMPARISON:  None. FINDINGS: CT HEAD FINDINGS Brain: No evidence of acute infarction, hemorrhage, hydrocephalus, extra-axial collection or mass lesion/mass effect. Vascular: No hyperdense vessel or unexpected calcification. Skull: Normal. Negative for fracture or focal lesion.  Sinuses/Orbits: The visualized paranasal sinuses are essentially clear. The mastoid air cells are unopacified. Other: None. CT CERVICAL SPINE FINDINGS Alignment: Normal cervical lordosis. Skull base and vertebrae: No acute fracture. No primary bone lesion or focal pathologic process. Soft tissues and spinal canal: No prevertebral fluid or swelling. No visible canal hematoma. Disc levels:  Intervertebral disc spaces are maintained. Upper chest: Visualized lung apices are clear. Other: Thyroid is unremarkable. IMPRESSION: Normal head CT. Normal cervical spine CT. Electronically Signed   By: Charline Bills M.D.   On: 03/19/2021 19:09   CT Cervical Spine Wo Contrast  Result Date: 03/19/2021 CLINICAL DATA:  Head/neck trauma EXAM: CT HEAD WITHOUT CONTRAST CT CERVICAL SPINE WITHOUT  CONTRAST TECHNIQUE: Multidetector CT imaging of the head and cervical spine was performed following the standard protocol without intravenous contrast. Multiplanar CT image reconstructions of the cervical spine were also generated. COMPARISON:  None. FINDINGS: CT HEAD FINDINGS Brain: No evidence of acute infarction, hemorrhage, hydrocephalus, extra-axial collection or mass lesion/mass effect. Vascular: No hyperdense vessel or unexpected calcification. Skull: Normal. Negative for fracture or focal lesion. Sinuses/Orbits: The visualized paranasal sinuses are essentially clear. The mastoid air cells are unopacified. Other: None. CT CERVICAL SPINE FINDINGS Alignment: Normal cervical lordosis. Skull base and vertebrae: No acute fracture. No primary bone lesion or focal pathologic process. Soft tissues and spinal canal: No prevertebral fluid or swelling. No visible canal hematoma. Disc levels:  Intervertebral disc spaces are maintained. Upper chest: Visualized lung apices are clear. Other: Thyroid is unremarkable. IMPRESSION: Normal head CT. Normal cervical spine CT. Electronically Signed   By: Charline Bills M.D.   On: 03/19/2021 19:09     Procedures Procedures   Medications Ordered in ED Medications  Tdap (BOOSTRIX) injection 0.5 mL (0.5 mLs Intramuscular Given 03/19/21 2036)    ED Course  I have reviewed the triage vital signs and the nursing notes.  Pertinent labs & imaging results that were available during my care of the patient were reviewed by me and considered in my medical decision making (see chart for details).    MDM Rules/Calculators/A&P                           30 y.o. M here with SI.  Had full assessment earlier and eloped.  Labs done <12 hours ago, have been reviewed.  VSS at present.  Denies drug use since leaving hospital earlier.  He does return with IV still in place-- this was removed in triage.  Have re-ordered TTS to be completed.  6:24 AM Patient has been resting in triage overnight, no acute events.  Still awaiting TTS consult.  Final Clinical Impression(s) / ED Diagnoses Final diagnoses:  Drug overdose, undetermined intent, subsequent encounter  Suicidal ideation    Rx / DC Orders ED Discharge Orders     None        Garlon Hatchet, PA-C 03/20/21 [id number]    Zadie Rhine, MD 03/20/21 [phone]

## 2021-03-20 NOTE — Discharge Instructions (Signed)
It was our pleasure to provide your ER care today - we hope that you feel better.  Follow up with [name] this Monday.  Also see resource guide to help access other social services, and other substance use treatment and behavioral health resources.   For mental health issues and/or crisis, you may also go directly to the [facility] - it is open 24/7 and walk-ins are welcome.   In event accidental opiate/heroin overuse or overdose - use narcan kit as directed and call EMS/911.   Return to ER if worse, new symptoms, fevers, trouble breathing, or other emergency concern.

## 2021-03-20 NOTE — Consult Note (Signed)
  Patient has been seen and reassessed. He was discharged yesterday from [facility], and began to use substances immediately after discharge. This resulted in an incidental overdose.He denies this as a suicide attempt. He currently denies any suicidal ideations at this time. He denies any homicidal ideations. He is aware that he is going to be rehab on Monday in [place]. He is offered transportation at this time to [facility], however prior to coordinating services he was discharged.   Patient is to follow up with rehab facility for appropriate disposition.

## 2021-03-20 NOTE — BH Assessment (Signed)
TTS attempted to complete assessment. Per Bonita Quin, RN, patient not roomed at this time. TTS will attempt at later times.

## 2021-03-20 NOTE — ED Triage Notes (Signed)
Pt left the ED "to go get some food and take a walk."  He was here earlier for an overdose where he received narcan. Pt was here earlier b/c he overdosed to kill himself.    RN removed the IV he left with from his right arm.

## 2021-03-20 NOTE — BH Assessment (Signed)
Per Dr. Lucianne Muss at the morning meeting- No initial TTS needed. Pt has been assigned a bed at [facility] starting Monday. NP Tanika to Assess pt.

## 2021-03-20 NOTE — ED Provider Notes (Addendum)
Emergency Medicine Observation Re-evaluation Note  Jared Jordan is a 30 y.o. male, seen on rounds today.  Pt initially presented to the ED for complaints of  heroin overdose. Pt indicates is feeling improved currently, although is hungry. Pt indicates was accidental overdose/was getting high. Pt denies thoughts of self harm or SI.   Physical Exam  BP 135/86 (BP Location: Right Arm)   Pulse 72   Temp 97.9 F (36.6 C) (Oral)   Resp 18   SpO2 99%  Physical Exam General: alert, content, nad.  Cardiac: regular rate. Lungs:  breathing comfortably. Psych: alert, content, conversant. Pt w normal mood/affect, does not appear acutely depressed. No plan to harm self or others, no SI. Pt is not responding to internal stimuli - no delusions or hallucinations noted.   ED Course / MDM   I have reviewed the labs performed to date as well as medications administered while in observation.  Recent changes in the last 24 hours include ED observation, and reassessment.   Plan    Brayant Couzens is not under involuntary commitment.  I reassessed pt - currently, no acute psychosis, and does not appear despondent or acutely depressed. Pt acknowledges social issues around ongoing substance abuse and housing. Will provide resource guide. Is encourage to pursue sobriety and use resource guide to access treatment programs. Note potentially can get into [name] next week.  Po fluids/food provided. Will give narcan kit for home use in event accidental overdose.   Pt indicates to nursing staff he feels improved, ready to leave, does not want to stay in hospital. Pt indicates will follow up closely as outpatient.   Pt currently appears stable for d/c.   Return precautions provided.       Lajean Saver, MD 03/20/21 [phone]

## 2021-03-20 NOTE — ED Notes (Signed)
Pt refused discharge vitals, pt has all personal belongings, pt is alert and oriented and stable, pt is requesting to be discharged, MD aware

## 2021-04-28 ENCOUNTER — Encounter (HOSPITAL_COMMUNITY): Payer: Self-pay

## 2021-04-28 ENCOUNTER — Emergency Department (HOSPITAL_COMMUNITY)
Admission: EM | Admit: 2021-04-28 | Discharge: 2021-04-28 | Disposition: A | Discharge status: Other Acute Inpatient Hospital | Payer: Self-pay | Attending: Emergency Medicine | Admitting: Emergency Medicine

## 2021-04-28 ENCOUNTER — Encounter (HOSPITAL_COMMUNITY): Payer: Self-pay | Admitting: Psychiatry

## 2021-04-28 ENCOUNTER — Emergency Department (HOSPITAL_COMMUNITY): Payer: Self-pay

## 2021-04-28 ENCOUNTER — Inpatient Hospital Stay (HOSPITAL_COMMUNITY)
Admission: AD | Admit: 2021-04-28 | Discharge: 2021-05-05 | DRG: 885 | Disposition: A | Discharge status: Home / Self Care | Payer: Federal, State, Local not specified - Other | Source: Intra-hospital | Attending: Emergency Medicine | Admitting: Emergency Medicine

## 2021-04-28 ENCOUNTER — Other Ambulatory Visit: Payer: Self-pay

## 2021-04-28 DIAGNOSIS — F1114 Opioid abuse with opioid-induced mood disorder: Secondary | ICD-10-CM | POA: Diagnosis present

## 2021-04-28 DIAGNOSIS — Z20822 Contact with and (suspected) exposure to covid-19: Secondary | ICD-10-CM | POA: Diagnosis present

## 2021-04-28 DIAGNOSIS — R45851 Suicidal ideations: Secondary | ICD-10-CM | POA: Insufficient documentation

## 2021-04-28 DIAGNOSIS — E871 Hypo-osmolality and hyponatremia: Secondary | ICD-10-CM | POA: Diagnosis present

## 2021-04-28 DIAGNOSIS — Z5900 Homelessness unspecified: Secondary | ICD-10-CM | POA: Diagnosis not present

## 2021-04-28 DIAGNOSIS — F17210 Nicotine dependence, cigarettes, uncomplicated: Secondary | ICD-10-CM | POA: Diagnosis present

## 2021-04-28 DIAGNOSIS — B1920 Unspecified viral hepatitis C without hepatic coma: Secondary | ICD-10-CM | POA: Diagnosis present

## 2021-04-28 DIAGNOSIS — F332 Major depressive disorder, recurrent severe without psychotic features: Principal | ICD-10-CM | POA: Diagnosis present

## 2021-04-28 DIAGNOSIS — R0781 Pleurodynia: Secondary | ICD-10-CM | POA: Insufficient documentation

## 2021-04-28 DIAGNOSIS — F1994 Other psychoactive substance use, unspecified with psychoactive substance-induced mood disorder: Secondary | ICD-10-CM | POA: Diagnosis present

## 2021-04-28 DIAGNOSIS — F1110 Opioid abuse, uncomplicated: Secondary | ICD-10-CM | POA: Diagnosis present

## 2021-04-28 DIAGNOSIS — F1124 Opioid dependence with opioid-induced mood disorder: Secondary | ICD-10-CM | POA: Insufficient documentation

## 2021-04-28 DIAGNOSIS — Z9152 Personal history of nonsuicidal self-harm: Secondary | ICD-10-CM

## 2021-04-28 DIAGNOSIS — E7800 Pure hypercholesterolemia, unspecified: Secondary | ICD-10-CM | POA: Diagnosis present

## 2021-04-28 DIAGNOSIS — G4700 Insomnia, unspecified: Secondary | ICD-10-CM | POA: Diagnosis present

## 2021-04-28 DIAGNOSIS — F32A Depression, unspecified: Secondary | ICD-10-CM

## 2021-04-28 LAB — CBC
HCT: 38.3 % — ABNORMAL LOW (ref 39.0–52.0)
Hemoglobin: 12.6 g/dL — ABNORMAL LOW (ref 13.0–17.0)
MCH: 29 pg (ref 26.0–34.0)
MCHC: 32.9 g/dL (ref 30.0–36.0)
MCV: 88 fL (ref 80.0–100.0)
Platelets: 329 10*3/uL (ref 150–400)
RBC: 4.35 MIL/uL (ref 4.22–5.81)
RDW: 13.8 % (ref 11.5–15.5)
WBC: 8.2 10*3/uL (ref 4.0–10.5)
nRBC: 0 % (ref 0.0–0.2)

## 2021-04-28 LAB — RESP PANEL BY RT-PCR (FLU A&B, COVID) ARPGX2
Influenza A by PCR: NEGATIVE
Influenza B by PCR: NEGATIVE
SARS Coronavirus 2 by RT PCR: NEGATIVE

## 2021-04-28 LAB — COMPREHENSIVE METABOLIC PANEL
ALT: 36 U/L (ref 0–44)
AST: 32 U/L (ref 15–41)
Albumin: 4.2 g/dL (ref 3.5–5.0)
Alkaline Phosphatase: 81 U/L (ref 38–126)
Anion gap: 8 (ref 5–15)
BUN: 13 mg/dL (ref 6–20)
CO2: 22 mmol/L (ref 22–32)
Calcium: 9 mg/dL (ref 8.9–10.3)
Chloride: 106 mmol/L (ref 98–111)
Creatinine, Ser: 0.66 mg/dL (ref 0.61–1.24)
GFR, Estimated: >60 mL/min (ref >60)
Glucose, Bld: 108 mg/dL — ABNORMAL HIGH (ref 70–99)
Potassium: 3.4 mmol/L — ABNORMAL LOW (ref 3.5–5.1)
Sodium: 136 mmol/L (ref 135–145)
Total Bilirubin: 0.3 mg/dL (ref 0.3–1.2)
Total Protein: 7.4 g/dL (ref 6.5–8.1)

## 2021-04-28 LAB — RAPID URINE DRUG SCREEN, HOSP PERFORMED
Amphetamines: POSITIVE — AB
Barbiturates: NOT DETECTED
Benzodiazepines: NOT DETECTED
Cocaine: NOT DETECTED
Opiates: NOT DETECTED
Tetrahydrocannabinol: NOT DETECTED

## 2021-04-28 LAB — ACETAMINOPHEN LEVEL: Acetaminophen (Tylenol), Serum: <10 ug/mL — ABNORMAL LOW (ref 10–30)

## 2021-04-28 LAB — SALICYLATE LEVEL: Salicylate Lvl: <7 mg/dL — ABNORMAL LOW (ref 7.0–30.0)

## 2021-04-28 LAB — ETHANOL: Alcohol, Ethyl (B): 136 mg/dL — ABNORMAL HIGH (ref <10)

## 2021-04-28 MED ORDER — VENLAFAXINE HCL ER 75 MG PO CP24
75.0000 mg | ORAL_CAPSULE | Freq: Every day | ORAL | Status: DC
  Administered 2021-04-29 – 2021-05-05 (×7): 75 mg via ORAL
  Filled 2021-04-28 (×2): qty 1
  Filled 2021-04-28 (×2): qty 14
  Filled 2021-04-28 (×5): qty 1
  Filled 2021-04-28: qty 14

## 2021-04-28 MED ORDER — ALUM & MAG HYDROXIDE-SIMETH 200-200-20 MG/5ML PO SUSP: 30.0000 mL | ORAL | Status: DC | PRN

## 2021-04-28 MED ORDER — HYDROXYZINE HCL 25 MG PO TABS
25.0000 mg | ORAL_TABLET | Freq: Three times a day (TID) | ORAL | Status: DC | PRN
  Filled 2021-04-28: qty 1

## 2021-04-28 MED ORDER — IBUPROFEN 400 MG PO TABS
400.0000 mg | ORAL_TABLET | Freq: Four times a day (QID) | ORAL | Status: DC | PRN
  Filled 2021-04-28: qty 1

## 2021-04-28 MED ORDER — ENSURE ENLIVE PO LIQD
237.0000 mL | Freq: Two times a day (BID) | ORAL | Status: DC
  Filled 2021-04-28 (×5): qty 237

## 2021-04-28 MED ORDER — MAGNESIUM HYDROXIDE 400 MG/5ML PO SUSP: 30.0000 mL | Freq: Every day | ORAL | Status: DC | PRN

## 2021-04-28 MED ORDER — TRAZODONE HCL 50 MG PO TABS
50.0000 mg | ORAL_TABLET | Freq: Every evening | ORAL | Status: DC | PRN
Start: 2021-04-28 — End: 2021-05-05
  Administered 2021-04-29 – 2021-05-04 (×4): 50 mg via ORAL
  Filled 2021-04-28 (×3): qty 1
  Filled 2021-04-28: qty 14
  Filled 2021-04-28 (×3): qty 1

## 2021-04-28 MED ORDER — ACETAMINOPHEN 325 MG PO TABS: 650.0000 mg | ORAL_TABLET | Freq: Four times a day (QID) | ORAL | Status: DC | PRN

## 2021-04-28 NOTE — ED Notes (Signed)
Safe transport contact.

## 2021-04-28 NOTE — ED Notes (Signed)
Pt provided with milk per request.

## 2021-04-28 NOTE — BH Assessment (Signed)
Clinician attempted to complete pt's MH Assessment but no providers/nurses/techs were attached to pt's chart in which to contact. A call to [facility] Triage at 0316 was not answered. TTS to attempt assessment at a later time.

## 2021-04-28 NOTE — ED Notes (Signed)
Safe transport has arrived

## 2021-04-28 NOTE — Tx Team (Signed)
Initial Treatment Plan 04/28/2021 11:39 PM Jared Jayme Cloud [id number]    PATIENT STRESSORS: Educational concerns   Loss of father passed one year ago   Medication change or noncompliance   Substance abuse     PATIENT STRENGTHS: Average or above average intelligence  Capable of independent living  Motivation for treatment/growth    PATIENT IDENTIFIED PROBLEMS: SI - jump off bridge on [address]  IV heroin use  Alcohol use d/o  Grief over father's death last year  Marijuana use  Medication non-compliance  ("Pt wants to get off heroin, find a way to deal with his grief and is agreeable to medication again")         DISCHARGE CRITERIA:  Improved stabilization in mood, thinking, and/or behavior Motivation to continue treatment in a less acute level of care Verbal commitment to aftercare and medication compliance  PRELIMINARY DISCHARGE PLAN: Attend aftercare/continuing care group Outpatient therapy Return to previous living arrangement Return to previous work or school arrangements  PATIENT/FAMILY INVOLVEMENT: This treatment plan has been presented to and reviewed with the patient, Jared Jordan, and/or family member.  The patient and family have been given the opportunity to ask questions and make suggestions.  Victorino December, RN 04/28/2021, 11:39 PM

## 2021-04-28 NOTE — Progress Notes (Signed)
Patient ID: Jared Jordan, male   DOB: 1991/04/24, 30 y.o.   MRN: [id number]  D: Pt here voluntarily from [facility]. Pt denies HI/AVH at this time. Pt remains suicidal with plan to jump off of a bridge on [address]. Pt contracts for safety while at [facility]. Pt says it is the anniversary of his father's death a year ago. Pt is still grieving. Denies effective grief counseling. "It's so fast. Everyone is moving so fast. I went a couple times (to counseling) but everyone was in a hurry." Pt endorses pain 7/10 in his right flank area. "I thought I had broken ribs but they did an xray and didn't find anything." Pt says he fell off a 7-foot ledge last week at work. 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   Pt lives alone and counts among his stressors: father's death, school, friends, family. Pt wants to work on his substance abuse (IV heroin use), getting help with his grief and getting back on medication. Pt says his past non-compliance is due to "there were a lot of things going on and the medicines weren't gonna help with that. It wasn't the right time." Pt says he has been using heroin, marijuana for the past 3-4 weeks everyday. Last marijuana use was Sunday and last heroin use and drink was also Sunday.    From [facility] Assessment:   CCA Screening, Triage and Referral (STR) Jared Jordan is a 30 year old patient who came to [facility] due to ongoing depression with a plan to kill himself by walking off of a bridge. Pt states, "I haven't been feeling like my life is worth living for the past week. My father died not too long ago and it seems like this summer got away from me in terms of taking care of my responsibilities, which made me more depressed." Pt endorses SI and a hx of SI. Pt shares he has attempted to kill himself in the past, most recently by cutting his wrist one year ago. Pt shares he was hospitalized at [facility] several months ago (chart records indicate pt was hospitalized  11/30/2020 - 12/04/2020). Pt shares he has a plan to walk off of a bridge in an effort to kill himself.    Pt denies HI, AVH, NSSIB, access to guns/weapons, or engagement with the legal system. He states he uses substances "off and on" and that he last shot up 1/4 gram of heroin on Sunday (yesterday) and last drank champagne and smoke marijuana on Saturday (two days ago).  A: Pt was offered support and encouragement. Pt is cooperative during assessment. VS assessed and admission paperwork signed. Belongings searched and contraband items placed in locker. Non-invasive skin search completed: pt has track marks on both arms from IV drug use. Pt offered food and drink and both accepted. Pt introduced to unit milieu by nursing staff. Q 15 minute checks were started for safety.   R: Pt in dayroom eating. Pt safety maintained on unit.

## 2021-04-28 NOTE — BH Assessment (Signed)
Comprehensive Clinical Assessment (CCA) Note  04/28/2021 Jared Jordan [id number]  Discharge Disposition: Jared Bering, NP, reviewed pt's chart and information and determined pt meets inpatient criteria. Pt's referral information will be reviewed by [facility] for an appropriate bed; if there is no appropriate bed at [facility], pt's referral information will be faxed out to other hospitals for potential placement. This information was relayed to pt's team at [phone].  The patient demonstrates the following risk factors for suicide: Chronic risk factors for suicide include: psychiatric disorder of Opioid dependence with opioid-induced mood disorder, substance use disorder, previous suicide attempts , the most recent taking place 1 year ago, and medical illness of Hepatitis C . Acute risk factors for suicide include: unemployment and social withdrawal/isolation. Protective factors for this patient include: hope for the future. Considering these factors, the overall suicide risk at this point appears to be high. Patient is not appropriate for outpatient follow up.  Therefore, a 1:1 sitter is recommended for suicide precautions.  Flowsheet Row ED from 04/28/2021 in [facility] from 03/20/2021 in [facility] EMERGENCY DEPARTMENT ED from 03/19/2021 in [facility] EMERGENCY DEPARTMENT  C-SSRS RISK CATEGORY High Risk Error: Q3, 4, or 5 should not be populated when Q2 is No Low Risk     Chief Complaint:  Chief Complaint  Patient presents with   Depression   Visit Diagnosis: F11.24, Opioid dependence with opioid-induced mood disorder   CCA Screening, Triage and Referral (STR) Jared Jordan is a 30 year old patient who came to [facility] due to ongoing depression with a plan to kill himself by walking off of a bridge. Pt states, "I haven't been feeling like my life is worth living for the past week. My father died not too long ago and it  seems like this summer got away from me in terms of taking care of my responsibilities, which made me more depressed." Pt endorses SI and a hx of SI. Pt shares he has attempted to kill himself in the past, most recently by cutting his wrist one year ago. Pt shares he was hospitalized at [facility] several months ago (chart records indicate pt was hospitalized 11/30/2020 - 12/04/2020). Pt shares he has a plan to walk off of a bridge in an effort to kill himself.   Pt denies HI, AVH, NSSIB, access to guns/weapons, or engagement with the legal system. He states he uses substances "off and on" and that he last shot up 1/4 gram of heroin on Sunday (yesterday) and last drank champagne and smoke marijuana on Saturday (two days ago).  Pt is oriented x5. His recent/remote memory is intact. Pt was cooperative throughout the assessment process. Pt's insight, judgement, and impulse control is impaired at this time.  Patient Reported Information How did you hear about [place]? Self  What Is the Reason for Your Visit/Call Today? Pt states, "I haven't been feeling like my life is worth living for the past week. My father died not too long ago and it seems like this summer got away from me in terms of taking care of my responsibilities, which made me more depressed." Pt endorses SI and a hx of SI. Pt shares he has attempted to kill himself in the past, most recently by cutting his wrist one year ago. Pt shares he was hospitalized at [facility] several months ago (chart records indicate pt was hospitalized 11/30/2020 - 12/04/2020). Pt shares he has a plan to walk off of a bridge in an effort to  kill himself. Pt denies HI, AVH, NSSIB, access to guns/weapons, or engagement with the legal system. He states he uses substances "off and on" and that he last shot up 1/4 gram of heroin on Sunday (yesterday) and last drank champagne and smoke marijuana on Saturday (two days ago).  How Long Has This Been Causing You Problems? > than 6 months  What Do  You Feel Would Help You the Most Today? Treatment for Depression or other mood problem   Have You Recently Had Any Thoughts About Hurting Yourself? Yes  Are You Planning to Commit Suicide/Harm Yourself At This time? Yes   Have you Recently Had Thoughts About Hurting Someone [name]? No  Are You Planning to Harm Someone at This Time? No  Explanation: No data recorded  Have You Used Any Alcohol or Drugs in the Past 24 Hours? Yes  How Long Ago Did You Use Drugs or Alcohol? No data recorded What Did You Use and How Much? Pt states he "shot up" 1/4 gram of heroin on Sunday (yesterday, 04/27/2021).   Do You Currently Have a Therapist/Psychiatrist? No  Name of Therapist/Psychiatrist: No data recorded  Have You Been Recently Discharged From Any Office Practice or Programs? No  Explanation of Discharge From Practice/Program: No data recorded    CCA Screening Triage Referral Assessment Type of Contact: Tele-Assessment  Telemedicine Service Delivery: Telemedicine service delivery: This service was provided via telemedicine using a 2-way, interactive audio and video technology  Is this Initial or Reassessment? Initial Assessment  Date Telepsych consult ordered in [facility]:  04/28/21  Time Telepsych consult ordered in [facility]:  0555  Location of Assessment: [facility] ED  Provider Location: [facility] Assessment Services   Collateral Involvement: None at this time   Does Patient Have a Automotive engineer Guardian? No data recorded Name and Contact of Legal Guardian: No data recorded If Minor and Not Living with Parent(s), Who has Custody? N/A  Is CPS involved or ever been involved? Never  Is APS involved or ever been involved? Never   Patient Determined To Be At Risk for Harm To Self or Others Based on Review of Patient Reported Information or Presenting Complaint? Yes, for Self-Harm  Method: No data recorded Availability of Means: No data recorded Intent: No data recorded Notification  Required: No data recorded Additional Information for Danger to Others Potential: No data recorded Additional Comments for Danger to Others Potential: No data recorded Are There Guns or Other Weapons in Your Home? No data recorded Types of Guns/Weapons: No data recorded Are These Weapons Safely Secured?                            No data recorded Who Could Verify You Are Able To Have These Secured: No data recorded Do You Have any Outstanding Charges, Pending Court Dates, Parole/Probation? No data recorded Contacted To Inform of Risk of Harm To Self or Others: Other: Comment (Pt denies he has supports in which clinician could make contact)    Does Patient Present under Involuntary Commitment? No  IVC Papers Initial File Date: No data recorded  [place] of Residence: [place]   Patient Currently Receiving the Following Services: Not Receiving Services   Determination of Need: Emergent (2 hours)   Options For Referral: Outpatient Therapy; Medication Management; Inpatient Hospitalization     CCA Biopsychosocial Patient Reported Schizophrenia/Schizoaffective Diagnosis in Past: No   Strengths: Pt is able to identify his needs in regards to  his mental health. Pt is able to identify his thoughts, feelings, and concerns. He openly answers the questions posed.   Mental Health Symptoms Depression:   Change in energy/activity; Hopelessness   Duration of Depressive symptoms:  Duration of Depressive Symptoms: Greater than two weeks   Mania:   None   Anxiety:    Difficulty concentrating; Worrying   Psychosis:   None   Duration of Psychotic symptoms:    Trauma:   None   Obsessions:   Disrupts routine/functioning   Compulsions:   None   Inattention:   None   Hyperactivity/Impulsivity:   N/A   Oppositional/Defiant Behaviors:   None   Emotional Irregularity:   Mood lability; Chronic feelings of emptiness; Recurrent suicidal behaviors/gestures/threats; Potentially  harmful impulsivity   Other Mood/Personality Symptoms:   None noted    Mental Status Exam Appearance and self-care  Stature:   Average   Weight:   Average weight   Clothing:   -- (Pt is dressed in scrubs)   Grooming:   Normal   Cosmetic use:   None   Posture/gait:   Normal   Motor activity:   Not Remarkable   Sensorium  Attention:   Normal   Concentration:   Anxiety interferes   Orientation:   X5   Recall/memory:   Normal   Affect and Mood  Affect:   Depressed   Mood:   Depressed; Hopeless   Relating  Eye contact:   Normal   Facial expression:   Depressed; Anxious   Attitude toward examiner:   Cooperative   Thought and Language  Speech flow:  Clear and Coherent   Thought content:   Appropriate to Mood and Circumstances   Preoccupation:   None   Hallucinations:   None   Organization:  No data recorded  [employer] of Knowledge:   Average   Intelligence:   Average   Abstraction:   Functional   Judgement:   Fair   Dance movement psychotherapist:   Realistic   Insight:   Lacking   Decision Making:   Impulsive   Social Functioning  Social Maturity:   Impulsive   Social Judgement:   Normal   Stress  Stressors:   Surveyor, quantity; Work; Architect Ability:   Deficient supports   Skill Deficits:   Interpersonal   Supports:   Support needed     Religion: Religion/Spirituality Are You A Religious Person?:  (Not assessed) How Might This Affect Treatment?: Not assessed  Leisure/Recreation: Leisure / Recreation Do You Have Hobbies?:  (Not assessed)  Exercise/Diet: Exercise/Diet Do You Exercise?:  (Not assessed) Have You Gained or Lost A Significant Amount of Weight in the Past Six Months?: Yes-Lost Number of Pounds Lost?: 20 Do You Follow a Special Diet?:  (Not assessed) Do You Have Any Trouble Sleeping?: Yes Explanation of Sleeping Difficulties: Pt states he averages 4 hours of  sleep/night   CCA Employment/Education Employment/Work Situation: Employment / Work Situation Employment Situation: Unemployed Patient's Job has Been Impacted by Current Illness: No Has Patient ever Been in Equities trader?: No Did You Receive Any Psychiatric Treatment/Services While in Equities trader?:  (N/A)  Education: Education Is Patient Currently Attending School?: No School Currently Attending: N/A Last Grade Completed: 14 Did You Product manager?: Yes What Type of College Degree Do you Have?: Patient has attended [employer] Did You Have An Individualized Education Program (IIEP): No Did You Have Any Difficulty At School?: No Patient's Education Has Been Impacted by Current Illness:  No   CCA Family/Childhood History Family and Relationship History: Family history Marital status: Single Does patient have children?: No  Childhood History:  Childhood History By whom was/is the patient raised?: Both parents Did patient suffer any verbal/emotional/physical/sexual abuse as a child?: No Did patient suffer from severe childhood neglect?: No Has patient ever been sexually abused/assaulted/raped as an adolescent or adult?: No Was the patient ever a victim of a crime or a disaster?: No Witnessed domestic violence?: No Has patient been affected by domestic violence as an adult?: No  Child/Adolescent Assessment:     CCA Substance Use Alcohol/Drug Use: Alcohol / Drug Use Pain Medications: See MAR Prescriptions: See MAR Over the Counter: See MAR History of alcohol / drug use?: Yes Longest period of sobriety (when/how long): 7 years; relapsed in 2020 and is currently using "off and on." Negative Consequences of Use: Financial, Personal relationships, Work / Programmer, multimedia Withdrawal Symptoms:  (Pt denies) Substance #1 Name of Substance 1: Opiates/Percocets/Heroin 1 - Age of First Use: Age 28 1 - Amount (size/oz): Pt last used 1/4 gram of heroin 1 - Frequency: 2x/week 1 - Duration:  Ongoing 1 - Last Use / Amount: Sunday (yesterday) 1 - Method of Aquiring: Not assessed 1- Route of Use: Injected Substance #2 Name of Substance 2: EtOH 2 - Age of First Use: [age] 2 - Amount (size/oz): 1 bottle of champagne 2 - Frequency: 2x/week 2 - Duration: Ongoing 2 - Last Use / Amount: Saturday (2 days ago) 2 - Method of Aquiring: Not assessed 2 - Route of Substance Use: Oral Substance #3 Name of Substance 3: Marijuana 3 - Age of First Use: [age] 3 - Amount (size/oz): "A few hits." 3 - Frequency: 2-3x/week 3 - Duration: Not assessed 3 - Last Use / Amount: Saturday (2 days ago) 3 - Method of Aquiring: Not assessed 3 - Route of Substance Use: Oral                   ASAM's:  Six Dimensions of Multidimensional Assessment  Dimension 1:  Acute Intoxication and/or Withdrawal Potential:   Dimension 1:  Description of individual's past and current experiences of substance use and withdrawal: None noted  Dimension 2:  Biomedical Conditions and Complications:   Dimension 2:  Description of patient's biomedical conditions and  complications: Patient previously shared he has 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 drug use.  Dimension 3:  Emotional, Behavioral, or Cognitive Conditions and Complications:  Dimension 3:  Description of emotional, behavioral, or cognitive conditions and complications: Patient states that he has a history of anxiety, depression, and suicidal thoughts.  Dimension 4:  Readiness to Change:  Dimension 4:  Description of Readiness to Change criteria: Patient states that he wants to get help and make positive changes in his life.  Dimension 5:  Relapse, Continued use, or Continued Problem Potential:  Dimension 5:  Relapse, continued use, or continued problem potential critiera description: Pt has a hx of relapsing.  Dimension 6:  Recovery/Living Environment:  Dimension 6:  Recovery/Iiving environment criteria description: Patient states that he lives in a  safe environment, though he lives alone with limited supports.  ASAM Severity Score: ASAM's Severity Rating Score: 11  ASAM Recommended Level of Treatment: ASAM Recommended Level of Treatment: Level III Residential Treatment   Substance use Disorder (SUD) Substance Use Disorder (SUD)  Checklist Symptoms of Substance Use: Continued use despite having a persistent/recurrent physical/psychological problem caused/exacerbated by use, Persistent desire or unsuccessful efforts to cut down  or control use, Social, occupational, recreational activities given up or reduced due to use, Substance(s) often taken in larger amounts or over longer times than was intended, Evidence of tolerance, Presence of craving or strong urge to use, Recurrent use that results in a failure to fulfill major role obligations (work, school, home)  Recommendations for Services/Supports/Treatments: Recommendations for Services/Supports/Treatments Recommendations For Services/Supports/Treatments: Medication Management, Individual Therapy, Inpatient Hospitalization  Discharge Disposition: Jared Bering, NP, reviewed pt's chart and information and determined pt meets inpatient criteria. Pt's referral information will be reviewed by [facility] for an appropriate bed; if there is no appropriate bed at [facility], pt's referral information will be faxed out to other hospitals for potential placement. This information was relayed to pt's team at [phone].  DSM5 Diagnoses: Patient Active Problem List   Diagnosis Date Noted   MDD (major depressive disorder), recurrent episode (HCC) 03/14/2021   MDD (major depressive disorder), recurrent severe, without psychosis (HCC) 02/08/2021   MDD (major depressive disorder), recurrent episode, severe (HCC) 11/30/2020   Major depressive disorder, recurrent severe without psychotic features (HCC)    Substance induced mood disorder (HCC) 11/22/2019     Referrals to Alternative Service(s): Referred to  Alternative Service(s):   Place:   Date:   Time:    Referred to Alternative Service(s):   Place:   Date:   Time:    Referred to Alternative Service(s):   Place:   Date:   Time:    Referred to Alternative Service(s):   Place:   Date:   Time:     Ralph Dowdy, LMFT

## 2021-04-28 NOTE — Progress Notes (Signed)
   04/28/21 2230  Psych Admission Type (Psych Patients Only)  Admission Status Voluntary  Psychosocial Assessment  Patient Complaints Anxiety;Depression;Hopelessness;Helplessness;Insomnia;Other (Comment);Substance abuse (grief over father's death)  Eye Contact Brief  Facial Expression Anxious  Affect Anxious  Speech Logical/coherent  Interaction Assertive  Motor Activity Fidgety  Appearance/Hygiene In scrubs  Behavior Characteristics Cooperative;Anxious  Mood Depressed;Anxious;Sad  Thought Process  Coherency WDL  Content WDL  Delusions None reported or observed  Perception WDL  Hallucination None reported or observed  Judgment Poor  Confusion None  Danger to Self  Current suicidal ideation? Active;Plan  Description of Suicide Plan Jump off of a bridge on [facility]  Self-Injurious Behavior No self-injurious ideation or behavior indicators observed or expressed   Agreement Not to Harm Self Yes  Description of Agreement verbal agreement to approach staff  Danger to Others  Danger to Others None reported or observed

## 2021-04-28 NOTE — ED Notes (Signed)
Pt belongings bagged: Bag 1: - Black Jeans - Black Jacket - Facilities manager - White T-shirt - Black/white button down - [employer] 2: - Black Shoes - Black Socks - Green/White Stripe bag with change of clothing.   Belongings placed in cabinets above 19-22 nurse desk

## 2021-04-28 NOTE — Consult Note (Signed)
Patient seen and reassessed by this psychiatric nurse practitioner. He continues to endorse ongoing depressive symptoms and suicidal ideations with a  plan, despite no longer being under the influence of substances. BAL on admission is 136. He continues to meet inpatient psychiatric criteria at this time.   -Will initiate contact with [facility] for inpatient admission; mood disorder bed.

## 2021-04-28 NOTE — ED Provider Notes (Addendum)
[facility]-EMERGENCY DEPT Provider Note   CSN: [id number] Arrival date & time: 04/28/21  0017     History Chief Complaint  Patient presents with   Depression    Jared Jordan is a 30 y.o. male.  30 year old male presents with complaint of depression and wanting to "end it all."  Has a plan to walk off a bridge.  Patient was last hospitalized with similar symptoms a month and a half ago.  Patient has not compliant with medications, states that he needs a situation change.  He reports right lower rib pain after trip and fall a week and a half ago.  No other complaints or concerns today.  Does report occasional alcohol use, denies drug use.      Past Medical History:  Diagnosis Date   Drug use     Patient Active Problem List   Diagnosis Date Noted   MDD (major depressive disorder), recurrent episode (HCC) 03/14/2021   MDD (major depressive disorder), recurrent severe, without psychosis (HCC) 02/08/2021   MDD (major depressive disorder), recurrent episode, severe (HCC) 11/30/2020   Major depressive disorder, recurrent severe without psychotic features (HCC)    Substance induced mood disorder (HCC) 11/22/2019    Past Surgical History:  Procedure Laterality Date   SHOULDER SURGERY     SHOULDER SURGERY Left 2020       No family history on file.  Social History   Tobacco Use   Smoking status: Every Day    Packs/day: 0.25    Years: 1.00    Pack years: 0.25    Types: Cigarettes   Smokeless tobacco: Never  Vaping Use   Vaping Use: Some days   Start date: 11/04/2018   Devices: Jewel Menthol  Substance Use Topics   Alcohol use: Yes    Alcohol/week: 4.0 - 6.0 standard drinks    Types: 4 - 6 Cans of beer per week    Comment: 2-3 times weekly   Drug use: Not Currently    Types: IV, Methamphetamines    Comment: using 1gm everyday heroin    Home Medications Prior to Admission medications   Medication Sig Start Date End Date Taking?  Authorizing Provider  hydrOXYzine (ATARAX/VISTARIL) 25 MG tablet Take 1 tablet (25 mg total) by mouth 3 (three) times daily as needed for anxiety. 02/17/21   Estella Husk, MD  ibuprofen (ADVIL) 200 MG tablet Take 400 mg by mouth every 6 (six) hours as needed for headache or moderate pain.    [provider]  traZODone (DESYREL) 50 MG tablet Take 1 tablet (50 mg total) by mouth at bedtime as needed for sleep. 02/17/21   Estella Husk, MD  venlafaxine XR (EFFEXOR-XR) 75 MG 24 hr capsule Take 1 capsule (75 mg total) by mouth daily with breakfast. 02/18/21   Estella Husk, MD    Allergies    Patient has no known allergies.  Review of Systems   Review of Systems  Constitutional:  Negative for fever.  Respiratory:  Negative for cough and shortness of breath.   Cardiovascular:  Negative for chest pain.  Gastrointestinal:  Negative for abdominal pain, constipation, diarrhea, nausea and vomiting.  Musculoskeletal:  Negative for arthralgias and myalgias.  Skin:  Negative for rash and wound.  Allergic/Immunologic: Negative for immunocompromised state.  Neurological:  Negative for weakness.  Hematological:  Negative for adenopathy.  Psychiatric/Behavioral:  Positive for decreased concentration and suicidal ideas.   All other systems reviewed and are negative.  Physical Exam  Updated Vital Signs BP (!) 146/83 (BP Location: Left Arm)   Pulse 99   Temp 98.4 F (36.9 C) (Oral)   Resp 18   Ht 5\' 10"  (1.778 m)   Wt 72.6 kg   SpO2 99%   BMI 22.96 kg/m   Physical Exam Vitals and nursing note reviewed.  Constitutional:      General: He is not in acute distress.    Appearance: He is well-developed. He is not diaphoretic.  HENT:     Head: Normocephalic and atraumatic.  Cardiovascular:     Rate and Rhythm: Normal rate and regular rhythm.     Pulses: Normal pulses.     Heart sounds: Normal heart sounds.  Pulmonary:     Effort: Pulmonary effort is normal.     Breath  sounds: Normal breath sounds.  Chest:     Chest wall: Tenderness present.  Abdominal:     Palpations: Abdomen is soft.     Tenderness: There is no abdominal tenderness.  Skin:    General: Skin is warm and dry.     Findings: No erythema or rash.  Neurological:     Mental Status: He is alert and oriented to person, place, and time.  Psychiatric:        Attention and Perception: Attention normal.        Mood and Affect: Affect is blunt.        Speech: Speech normal.        Behavior: Behavior is withdrawn.        Thought Content: Thought content is not paranoid. Thought content includes suicidal ideation. Thought content does not include homicidal ideation.    ED Results / Procedures / Treatments   Labs (all labs ordered are listed, but only abnormal results are displayed) Labs Reviewed  COMPREHENSIVE METABOLIC PANEL - Abnormal; Notable for the following components:      Result Value   Potassium 3.4 (*)    Glucose, Bld 108 (*)    All other components within normal limits  ETHANOL - Abnormal; Notable for the following components:   Alcohol, Ethyl (B) 136 (*)    All other components within normal limits  SALICYLATE LEVEL - Abnormal; Notable for the following components:   Salicylate Lvl <7.0 (*)    All other components within normal limits  ACETAMINOPHEN LEVEL - Abnormal; Notable for the following components:   Acetaminophen (Tylenol), Serum <10 (*)    All other components within normal limits  CBC - Abnormal; Notable for the following components:   Hemoglobin 12.6 (*)    HCT 38.3 (*)    All other components within normal limits  RAPID URINE DRUG SCREEN, HOSP PERFORMED - Abnormal; Notable for the following components:   Amphetamines POSITIVE (*)    All other components within normal limits  RESP PANEL BY RT-PCR (FLU A&B, COVID) ARPGX2    EKG None  Radiology DG Ribs Unilateral W/Chest Right  Result Date: 04/28/2021 CLINICAL DATA:  30 year old male with history of trauma  from a fall 1 week ago complaining of right mid anterior rib pain. EXAM: RIGHT RIBS AND CHEST - 3+ VIEW COMPARISON:  Chest x-ray 06/29/2018. FINDINGS: Lung volumes are normal. No consolidative airspace disease. No pleural effusions. No pneumothorax. No pulmonary nodule or mass noted. Pulmonary vasculature and the cardiomediastinal silhouette are within normal limits. Dedicated views of the right ribs demonstrate a radiopaque marker in place overlying the lower right ribs. No definite acute displaced right-sided rib fractures are identified. IMPRESSION: 1.  No acute displaced right-sided rib fractures. 2. No radiographic evidence of acute cardiopulmonary disease. Electronically Signed   By: Trudie Reed M.D.   On: 04/28/2021 05:31    Procedures Procedures   Medications Ordered in ED Medications - No data to display  ED Course  I have reviewed the triage vital signs and the nursing notes.  Pertinent labs & imaging results that were available during my care of the patient were reviewed by me and considered in my medical decision making (see chart for details).  Clinical Course as of 04/28/21 0615  Mon Apr 28, 2021  4732 30 year old male presents with complaint of depression with suicidal ideation with plan.  History of same.  Also reports pain in his right lower ribs after a fall a week and a half ago.  There is right midclavicular anterior chest wall tenderness, abdomen is soft and nontender.  Alcohol is elevated at 136.  CBC, CMP without significant findings, aspirin Tylenol negative.  UDS pending collection.  Patient is medically cleared for behavioral health assessment and disposition. [LM]  [phone] Will obtain x-ray right ribs to further evaluate his complaint of rib pain. [LM]  0605 X-ray ribs negative.  Patient currently in TTS consult. Labs are reviewed, no significant findings on CBC, CMP.  Alcohol is elevated at 136.  Tylenol and salicylate negative.  UDS positive for amphetamines.  COVID  swab pending. [LM]  [phone] Patient valuated by behavioral health who feels patient meets inpatient criteria and will work on placement. [LM]    Clinical Course User Index [LM] Alden Hipp   MDM Rules/Calculators/A&P                           Final Clinical Impression(s) / ED Diagnoses Final diagnoses:  Depression, unspecified depression type    Rx / DC Orders ED Discharge Orders     None        Jeannie Fend, PA-C 04/28/21 8127    Jeannie Fend, PA-C 04/28/21 0615    Molpus, John, MD 04/28/21 0617    Jeannie Fend, PA-C 04/30/21 1910    Molpus, Jonny Ruiz, MD 04/30/21 2233

## 2021-04-28 NOTE — ED Notes (Signed)
Report called to [place] at [facility]. Pt good to go by safe transport after 9pm.

## 2021-04-28 NOTE — ED Notes (Signed)
Pt ambulatory with a steady gait to and from bathroom. Pt changed into purple scrubs. Pt calm and cooperative.

## 2021-04-28 NOTE — BH Assessment (Signed)
BHH Assessment Progress Note   Per Caryn Bee, NP, this pt requires psychiatric hospitalization at this time.  Linsey, RN, [facility] has assigned pt to [facility] Rm 302-1 to the service of Dr Sherron Flemings.  She will call when ready to receive pt.  Pt has signed Voluntary Admission and Consent for Treatment, as well as Consent to Release Information to no one, and signed forms have been faxed to [facility].  EDP Virgina Norfolk, DO and pt's nurse, Florentina Addison, have been notified, and Florentina Addison agrees to send original paperwork along with pt via Safe Transport, and to call report to [phone].  Doylene Canning, [place] [facility] Coordinator [phone]

## 2021-04-28 NOTE — ED Triage Notes (Signed)
Pt reports ongoing depression and losing his will to live. Requesting transport to behavior health for monitoring. Says he has been having thoughts of walking off a bridge.

## 2021-04-28 NOTE — ED Notes (Signed)
Pt speaking with TTS 

## 2021-04-29 DIAGNOSIS — F1110 Opioid abuse, uncomplicated: Secondary | ICD-10-CM | POA: Diagnosis present

## 2021-04-29 DIAGNOSIS — B1920 Unspecified viral hepatitis C without hepatic coma: Secondary | ICD-10-CM | POA: Diagnosis present

## 2021-04-29 DIAGNOSIS — F332 Major depressive disorder, recurrent severe without psychotic features: Principal | ICD-10-CM

## 2021-04-29 LAB — LIPID PANEL
Cholesterol: 202 mg/dL — ABNORMAL HIGH (ref 0–200)
HDL: 63 mg/dL (ref >40)
LDL Cholesterol: 112 mg/dL — ABNORMAL HIGH (ref 0–99)
Total CHOL/HDL Ratio: 3.2 RATIO
Triglycerides: 134 mg/dL (ref <150)
VLDL: 27 mg/dL (ref 0–40)

## 2021-04-29 LAB — TSH: TSH: 0.392 u[IU]/mL (ref 0.350–4.500)

## 2021-04-29 LAB — HEMOGLOBIN A1C
Hgb A1c MFr Bld: 5.4 % (ref 4.8–5.6)
Mean Plasma Glucose: 108.28 mg/dL

## 2021-04-29 MED ORDER — PANTOPRAZOLE SODIUM 40 MG PO TBEC
40.0000 mg | DELAYED_RELEASE_TABLET | Freq: Every day | ORAL | Status: DC
  Administered 2021-04-29 – 2021-05-05 (×7): 40 mg via ORAL
  Filled 2021-04-29: qty 1
  Filled 2021-04-29 (×2): qty 14
  Filled 2021-04-29 (×5): qty 1
  Filled 2021-04-29: qty 14
  Filled 2021-04-29 (×2): qty 1

## 2021-04-29 MED ORDER — LORAZEPAM 1 MG PO TABS: 1.0000 mg | ORAL_TABLET | Freq: Four times a day (QID) | ORAL | Status: AC | PRN

## 2021-04-29 MED ORDER — NAPROXEN 500 MG PO TABS
500.0000 mg | ORAL_TABLET | Freq: Two times a day (BID) | ORAL | Status: AC | PRN
  Administered 2021-04-29 – 2021-05-04 (×5): 500 mg via ORAL
  Filled 2021-04-29 (×5): qty 1

## 2021-04-29 MED ORDER — ONDANSETRON 4 MG PO TBDP: 4.0000 mg | ORAL_TABLET | Freq: Four times a day (QID) | ORAL | Status: AC | PRN

## 2021-04-29 MED ORDER — THIAMINE HCL 100 MG PO TABS
100.0000 mg | ORAL_TABLET | Freq: Every day | ORAL | Status: DC
  Administered 2021-04-30 – 2021-05-05 (×6): 100 mg via ORAL
  Filled 2021-04-29 (×8): qty 1

## 2021-04-29 MED ORDER — LOPERAMIDE HCL 2 MG PO CAPS: 2.0000 mg | ORAL_CAPSULE | ORAL | Status: AC | PRN

## 2021-04-29 MED ORDER — HYDROXYZINE HCL 25 MG PO TABS
25.0000 mg | ORAL_TABLET | Freq: Four times a day (QID) | ORAL | Status: AC | PRN
  Administered 2021-04-29 – 2021-05-01 (×4): 25 mg via ORAL
  Filled 2021-04-29 (×5): qty 1

## 2021-04-29 MED ORDER — POTASSIUM CHLORIDE 20 MEQ PO PACK
40.0000 meq | PACK | Freq: Two times a day (BID) | ORAL | Status: DC
  Filled 2021-04-29: qty 2

## 2021-04-29 MED ORDER — ACETAMINOPHEN 500 MG PO TABS
500.0000 mg | ORAL_TABLET | Freq: Four times a day (QID) | ORAL | Status: DC | PRN
  Administered 2021-05-05: 500 mg via ORAL
  Filled 2021-04-29: qty 1

## 2021-04-29 MED ORDER — POTASSIUM CHLORIDE 20 MEQ PO PACK
40.0000 meq | PACK | Freq: Once | ORAL | Status: DC
  Filled 2021-04-29: qty 2

## 2021-04-29 MED ORDER — POTASSIUM CHLORIDE 20 MEQ PO PACK
40.0000 meq | PACK | Freq: Two times a day (BID) | ORAL | Status: DC
  Filled 2021-04-29 (×2): qty 2

## 2021-04-29 MED ORDER — METHOCARBAMOL 500 MG PO TABS
500.0000 mg | ORAL_TABLET | Freq: Three times a day (TID) | ORAL | Status: AC | PRN
  Administered 2021-04-29 – 2021-05-04 (×4): 500 mg via ORAL
  Filled 2021-04-29 (×4): qty 1

## 2021-04-29 MED ORDER — DICYCLOMINE HCL 20 MG PO TABS: 20.0000 mg | ORAL_TABLET | Freq: Four times a day (QID) | ORAL | Status: AC | PRN

## 2021-04-29 MED ORDER — ADULT MULTIVITAMIN W/MINERALS CH
1.0000 | ORAL_TABLET | Freq: Every day | ORAL | Status: DC
  Administered 2021-04-30 – 2021-05-05 (×6): 1 via ORAL
  Filled 2021-04-29 (×9): qty 1

## 2021-04-29 NOTE — Progress Notes (Signed)
D:  He was visible on the unit.  Attended evening wrap up group.  He is minimal with staff but pleasant.  He reported vague withdrawal symptoms (body aches and anxiety) but states "it will be fine."  He continues to report passive SI and will seek out staff if that worsens. He denies HI or AVH.   A:  1:1 with RN for support and encouragement.  Medications given as ordered.  Q 15 minute checks maintained for safety.  Encouraged participation in group and unit activities.   R:  He is currently resting with his eyes closed and appears to be asleep.  He remains safe on the unit.  We will continue to monitor the progress towards his goals.   04/29/21 2141  Psych Admission Type (Psych Patients Only)  Admission Status Voluntary  Psychosocial Assessment  Patient Complaints Anxiety;Depression;Insomnia  Eye Contact Brief  Facial Expression Anxious  Affect Anxious  Speech Logical/coherent  Interaction Assertive  Motor Activity Other (Comment) (unremarkable)  Appearance/Hygiene Unremarkable  Behavior Characteristics Cooperative;Appropriate to situation;Anxious  Mood Depressed;Anxious  Thought Process  Coherency WDL  Content WDL  Delusions None reported or observed  Perception WDL  Hallucination None reported or observed  Judgment Poor  Confusion None  Danger to Self  Current suicidal ideation? Passive  Self-Injurious Behavior No self-injurious ideation or behavior indicators observed or expressed   Agreement Not to Harm Self Yes  Description of Agreement verbal agreement to not harm self on the unit  Danger to Others  Danger to Others None reported or observed

## 2021-04-29 NOTE — Progress Notes (Signed)
NUTRITION ASSESSMENT RD working remotely.   Pt identified as at risk on the Malnutrition Screen Tool  INTERVENTION: - will d/c Ensure Enlive. - will order 1 tablet multivitamin with minerals/day. [facility] staff to continue to encourage PO intakes of meals and snacks.   NUTRITION DIAGNOSIS: Unintentional weight loss related to sub-optimal intake as evidenced by pt report.   Goal: Pt to meet >/= 90% of their estimated nutrition needs.  Monitor:  PO intake  Assessment:  Patient presented to the ED with depression and SI with a plan to walk off of a bridge.   Weight yesterday and on 9/14/122 documented as 160 lb and appears to be a stated weight. Weight on 9/1 was documented as 170 lb. This would indicate 10 lb weight loss (6% body weight).   Ensure Enlive was ordered BID per ONS protocol starting this AM and patient refused supplement.   30 y.o. male  Height: Ht Readings from Last 1 Encounters:  04/28/21 5\' 10"  (1.778 m)    Weight: Wt Readings from Last 1 Encounters:  04/28/21 72.6 kg    Weight Hx: Wt Readings from Last 10 Encounters:  04/28/21 72.6 kg  04/28/21 72.6 kg  03/19/21 72.6 kg  03/14/21 77.1 kg  03/06/21 77.1 kg  02/08/21 74.8 kg  01/27/21 74.8 kg  11/30/20 76.2 kg  11/30/20 85 kg  04/10/20 81.6 kg    BMI:  Body mass index is 22.96 kg/m. Pt meets criteria for normal weight based on current BMI.  Estimated Nutritional Needs: Kcal: 25-30 kcal/kg Protein: > 1 gram protein/kg Fluid: 1 ml/kcal  Diet Order:  Diet Order             Diet regular Room service appropriate? Yes; Fluid consistency: Thin  Diet effective now                  Pt is also offered choice of unit snacks mid-morning and mid-afternoon.  Pt is eating as desired.   Lab results and medications reviewed.      06/10/20, MS, RD, LDN, CNSC Inpatient Clinical Dietitian RD pager # available in [facility]  After hours/weekend pager # available in [facility]

## 2021-04-29 NOTE — Progress Notes (Signed)
   04/29/21 0621  Vital Signs  Temp 97.7 F (36.5 C)  Pulse Rate 78  BP (!) 143/104  BP Location Right Arm  BP Method Automatic  Patient Position (if appropriate) Sitting  Oxygen Therapy  SpO2 99 %   D: Patient denies admits to passive SI but denies HI and AVH. Pt. Rated anxiety 7/10 and depression 10/10/ Pt. Isolated in his room. A:  Patient took scheduled medicine.  Support and encouragement provided Routine safety checks conducted every 15 minutes. Patient  Informed to notify staff with any concerns.   R: Safety maintained.

## 2021-04-29 NOTE — BHH Group Notes (Signed)
Pt was appropriate and attentive during tonight's wrap up group. Pt was was able to share that he continue to have hard time with females and drugs. Pts stated that he wants to do the right thing and follow doctors orders. Pt stated that he just need to work the programs  and not do his own thing.   The focus of this group is to help patients review their daily goal of treatment and discuss progress on daily workbooks.

## 2021-04-29 NOTE — BHH Group Notes (Signed)
Adult Psychoeducational Group Note  Date:  04/29/2021 Time:  4:27 PM  Group Topic/Focus:  Managing Feelings:   The focus of this group is to identify what feelings patients have difficulty handling and develop a plan to handle them in a healthier way upon discharge.  Participation Level:  Active  Participation Quality:  Appropriate  Affect:  Appropriate  Cognitive:  Appropriate  Insight: Appropriate  Engagement in Group:  Engaged  Modes of Intervention:  Discussion  Additional Comments:  Patient attended and participated in the Psycho-Ed group.  Jearl Klinefelter 04/29/2021, 4:27 PM

## 2021-04-29 NOTE — BHH Suicide Risk Assessment (Signed)
[facility] Admission Suicide Risk Assessment   Nursing information obtained from:  Patient, Review of record Demographic factors:  Male, Caucasian, Living alone Current Mental Status:  Suicidal ideation indicated by patient Loss Factors:  Loss of significant relationship (one year anniversary of father's death) Historical Factors:  Impulsivity, Prior suicide attempts, substance use prior to admission, previous psychiatric diagnoses/treatment Risk Reduction Factors:  Positive social supports  Total Time Spent in Direct Patient Care:  I personally spent 45 minutes on the unit in direct patient care. The direct patient care time included face-to-face time with the patient, reviewing the patient's chart, communicating with other professionals, and coordinating care. Greater than 50% of this time was spent in counseling or coordinating care with the patient regarding goals of hospitalization, psycho-education, and discharge planning needs.  Principal Problem: MDD (major depressive disorder), recurrent severe, without psychosis (HCC) Diagnosis:  Principal Problem:   MDD (major depressive disorder), recurrent severe, without psychosis (HCC) Active Problems:   Substance induced mood disorder (HCC)   Opiate abuse, continuous (HCC)   Hepatitis C  Subjective Data: The patient is a 30y/o male with h/o MDD and polysubstance abuse who was admitted voluntarily for management of SI in the context of heroin abuse. The patient was admitted at [facility] in May 2022 at which time he was felt to have MDD vs a substance induced mood d/o and was discharge home on a combination of Zyprexa, Remeron, and Neurontin. He states that soon after discharge from [facility], he was subsequently readmitted at [facility] inpatient psychiatric unit at which time he was started on Subutex and Effexor and was referred to [facility] residential for help with his addictions. He stayed at [facility] for a week before signing himself out of treatment and  states he has been back and forth to [facility] or the ED for ongoing addiction issues and depressed mood over the summer. He has been living on the streets and states he has not followed through on referrals that have been made for substance abuse treatment. He has been recently injecting $20 worth of heroin daily since his last hospital discharge and has occasionally been injecting IV methamphetamine if he cannot locate heroin. He has been using THC intermittently in addition to drinking alcohol intermittently with elevated BAL on admission. He is vague as to his pattern of alcohol use. He states that presently he has mild withdrawal symptoms including sweats, anxiety, and nausea and would like referral to residential rehab again. He states that prior to admission he had SI with thoughts of jumping off a bridge but he denies current SI and can contract for safety. He states he cut his wrists in a suicide attempt 2 years ago and has been psychiatrically admitted 3-4 times in the past for depression and addiction issues. When questioned about previous mania/hypomania, he is vague. He thinks he may have had 1 episode when clean and sober where he was more "witty", was talkative, and did not need as much sleep but is vague in his recall. He denies AVH or paranoia. He reports recent issues with depressed mood, poor sleep, anhedonia, guilt about his drug use, low energy, poor focus, and fluctuating appetite. He is currently on Effexor and he is not interested in restart of any mood stabilizer. See H&P for additional details.   Continued Clinical Symptoms:  Alcohol Use Disorder Identification Test Final Score (AUDIT): 9 The "Alcohol Use Disorders Identification Test", Guidelines for Use in Primary Care, Second Edition.  World Science writer [facility]). Score  between 0-7:  no or low risk or alcohol related problems. Score between 8-15:  moderate risk of alcohol related problems. Score between 16-19:  high risk of alcohol  related problems. Score 20 or above:  warrants further diagnostic evaluation for alcohol dependence and treatment.   CLINICAL FACTORS:   Depression:   Anhedonia Comorbid alcohol abuse/dependence Hopelessness Impulsivity Insomnia Severe Alcohol/Substance Abuse/Dependencies More than one psychiatric diagnosis Previous Psychiatric Diagnoses and Treatments   Musculoskeletal: Strength & Muscle Tone: within normal limits Gait & Station: normal Patient leans: N/A Psychiatric Specialty Exam: Physical Exam Vitals reviewed.  HENT:     Head: Normocephalic.  Pulmonary:     Effort: Pulmonary effort is normal.  Neurological:     General: No focal deficit present.     Mental Status: He is alert.    Review of Systems- see H&P  Blood pressure (!) 143/99, pulse 90, temperature (!) 97.3 F (36.3 C), temperature source Oral, resp. rate 16, height 5\' 10"  (1.778 m), weight 72.6 kg, SpO2 99 %.Body mass index is 22.96 kg/m.  General Appearance:  casually dressed, fair hygiene  Eye Contact:  Fair  Speech:  Clear and Coherent and Normal Rate  Volume:  Normal  Mood:  Anxious and Dysphoric  Affect:  Constricted  Thought Process:  Goal Directed and Linear  Orientation:  Full (Time, Place, and Person)  Thought Content:  Logical and no evidence of psychosis, delusions, or paranoia  Suicidal Thoughts:   SI with plan prior to admission but denies current SI, intent or plan and contracts for safety on the unit  Homicidal Thoughts:  No  Memory:  Recent;   Fair  Judgement:  Fair  Insight:  Lacking  Psychomotor Activity:  Normal  Concentration:  Concentration: Good and Attention Span: Good  Recall:  Fair  Fund of Knowledge:  Good  Language:  Good  Akathisia:  Negative  Assets:  Communication Skills Desire for Improvement Resilience  ADL's:  Intact  Cognition:  WNL    COGNITIVE FEATURES THAT CONTRIBUTE TO RISK:  Thought constriction (tunnel vision)    SUICIDE RISK:   Moderate:  Frequent  suicidal ideation with limited intensity, and duration, some specificity in terms of plans, no associated intent, good self-control, limited dysphoria/symptomatology, some risk factors present, and identifiable protective factors, including available and accessible social support.  PLAN OF CARE: Patient admitted voluntarily to [facility]. Admission labs reviewed: Lipid panel WNL except for Cholesterol 202 and LDL 112; A1c 5.4; TSH 0.392; UDS positive for amphetamines; respiratory panel negative; WBC 8.2, H/H 12.6/38.3, platelets 329; CMP WNL except for K+ 3.4 and glucose 108; AST 32, ALT 36; Tylenol <10, Salicylate <7, ETOH 136; EKG shows NSR 65bpm with possible LAE and QTC 12-11-1999.   Patient was made aware that we have been attempting to reach him since his May discharge to make him aware that his viral load for Hep C was positive and that he needs f/u with PCP/GI for management of Hep C. The need for blood bourne precautions and safe sex practices were discussed.  Patient will be continued on Effexor XR 75mg  daily which was restarted prior to admission. He has been given a replacement of K+ and will repeat BMP for trending of hypokalemia. He has declined restart of mood stabilizers (previously on Neurontin and Zyprexa). He will be placed on CIWA protocol for possible alcohol withdrawal monitoring with thiamine and MVI replacement and Ativan for CIWA score >10. He has PRNS available for potential opiate withdrawal. SW to assist  with referrals for outpatient SA treatment.   I certify that inpatient services furnished can reasonably be expected to improve the patient's condition.   Comer Locket, MD, FAPA 04/29/2021, 4:41 PM

## 2021-04-29 NOTE — H&P (Addendum)
Psychiatric Admission Assessment Adult  Patient Identification: Jared Jordan MRN:  [id number] Date of Evaluation:  04/29/2021 Chief Complaint:  Substance induced mood disorder (HCC) [F19.94] Principal Diagnosis: MDD (major depressive disorder), recurrent severe, without psychosis (HCC) Diagnosis:  Principal Problem:   MDD (major depressive disorder), recurrent severe, without psychosis (HCC) Active Problems:   Substance induced mood disorder (HCC)   Opiate abuse, continuous (HCC)   Hepatitis C  History of Present Illness: Patient is 30 yo w/ reported psychiatric history of opiate dependence, substance-induced mood disorder, depression, substance abuse presents to [facility] from [place] [facility] ED due to worsening suicidal ideation.  CHART REVIEW Patient has had multiple psychiatric hospitalizations with the most recent one being in [facility] from 12/04/2020 to 12/12/2020.  This followed his hospitalization at [facility] from 11/30/2020 to 12/04/2020.  Patient was discharged to rehab at [facility] residential and Zoloft 25 mg.  Patient relapsed on opioids and was back in the ED on December 17, 2020.  Patient has been in the ED 12 times for "accidental overdoses", major depressive disorder, and suicidal ideation.  Patient has been to [facility] multiple times since being discharged from [facility] in June as well.  Pertinent labs prior to admission include: Negative COVID PCR, UDS positive for amphetamines, alcohol 136, potassium 3.4, hemoglobin 12.6, hematocrit 38.3.  Today's Interview Patient seen and assessed with attending Dr. Mason Jim.  Patient states he is at [facility] because of severe depression as well as active suicidal ideation with plan to jump off [facility] bridge.  Patient states stressors include daily IV heroin use, father's death in 01/28/20and disputes with mother.  Patient states that he has been "getting comfortable" with how he is living with being homeless and "getting high".   Patient states that he has been experiencing depression symptoms including poor concentration and energy, appetite disturbances, poor sleep, guilt over substance abuse, anhedonia, low interest.  Patient states that all he needs to do is "rest and eat" and he will no longer feel depressed and suicidal.  Patient requesting substance rehab residential treatment.  Patient repeatedly states he is "trying to get help" and that whenever he tries "something small would happen and get in my way".  Discussed with patient his need to identify specific coping skills and ways to ensure that this admission will be different as patient has had multiple hospitalizations and multiple overdoses.  Patient endorses passive SI but is able to contract for safety while on the unit.  Patient denies present HI/AVH.  Patient states that he regularly "shoots heroin approximately $20 a day all summer".  When patient is unable to shoot heroin, patient will "shoot methamphetamine".  Patient states that he regularly drinks alcohol and he had arrived at the ED intoxicated.  Patient endorses that he regularly experience withdrawal symptoms including sweating, anxiety, nausea, and agitation.  Patient states he is experiencing those symptoms right now.  Patient smokes a few cigarettes per day but does not want nicotine patch while hospitalized.  Patient states he also occasionally smokes THC.  Patient is presently homeless but will occasionally live with different friends.  Patient states that he is currently living in a "embarrassing place".  Patient has no income and he is living "day-to-day".   Patient does not appear to have significant manic symptoms.  Patient mentions that he occasionally has days where he is energetic and possible elevated mood but these are within the context of heroin use.  Patient also states that he  has not had significant manic symptoms while on antidepressant treatment.  Patient does not endorse delusions,  hallucinations, ideas of reference, first rank symptoms.  Patient denies flashbacks/dissociations, nightmares, trauma history, hypervigilance, avoidance, amnesia.  Patient denies significant medical history beyond shoulder bone spur and was informed of hepatitis C that was positive last [facility] admission in June.  Associated Signs/Symptoms: Depression Symptoms:  depressed mood, anhedonia, insomnia, feelings of worthlessness/guilt, difficulty concentrating, hopelessness, recurrent thoughts of death, suicidal thoughts with specific plan, anxiety, disturbed sleep, Duration of Depression Symptoms: Greater than two weeks  (Hypo) Manic Symptoms:   none Anxiety Symptoms:   none Psychotic Symptoms:   none PTSD Symptoms: Negative Total Time spent with patient: 1 hour I personally spent 60 minutes on the unit in direct patient care. The direct patient care time included face-to-face time with the patient, reviewing the patient's chart, communicating with other professionals, and coordinating care. Greater than 50% of this time was spent in counseling or coordinating care with the patient regarding goals of hospitalization, psycho-education, and discharge planning needs.   Past Psychiatric History: Depression, substance abuse, and substance induced mood disorder.  Patient has had para suicidal behaviors including slitting his wrist 2 years ago.  Patient approximates that he has had 3-4 psychiatric admissions  Is the patient at risk to self? Yes.    Has the patient been a risk to self in the past 6 months? Yes.    Has the patient been a risk to self within the distant past? Yes.    Is the patient a risk to others? No.  Has the patient been a risk to others in the past 6 months? No.  Has the patient been a risk to others within the distant past? No.   Prior Inpatient Therapy:  yes Prior Outpatient Therapy:  yes  Alcohol Screening: 1. How often do you have a drink containing alcohol?: 2 to 4  times a month (weekly) 2. How many drinks containing alcohol do you have on a typical day when you are drinking?: 3 or 4 3. How often do you have six or more drinks on one occasion?: Weekly AUDIT-C Score: 6 4. How often during the last year have you found that you were not able to stop drinking once you had started?: Less than monthly 5. How often during the last year have you failed to do what was normally expected from you because of drinking?: Never 6. How often during the last year have you needed a first drink in the morning to get yourself going after a heavy drinking session?: Never 7. How often during the last year have you had a feeling of guilt of remorse after drinking?: Never 8. How often during the last year have you been unable to remember what happened the night before because you had been drinking?: Monthly 9. Have you or someone else been injured as a result of your drinking?: No 10. Has a relative or friend or a doctor or another health worker been concerned about your drinking or suggested you cut down?: No Alcohol Use Disorder Identification Test Final Score (AUDIT): 9 Alcohol Brief Interventions/Follow-up: Alcohol education/Brief advice Substance Abuse History in the last 12 months:  Yes.   Consequences of Substance Abuse: Medical Consequences:  multiple ED visits for overdose Previous Psychotropic Medications: Yes  Psychological Evaluations: No  Past Medical History:  Past Medical History:  Diagnosis Date   Drug use     Past Surgical History:  Procedure Laterality Date   SHOULDER  SURGERY     SHOULDER SURGERY Left 2020   Family History: History reviewed. No pertinent family history. Family Psychiatric  History: Patient reports that dad having opiate addiction, and mom side having bipolar disorder Tobacco Screening:   Social History:  Social History   Substance and Sexual Activity  Alcohol Use Yes   Alcohol/week: 4.0 - 6.0 standard drinks   Types: 4 - 6 Cans  of beer per week   Comment: normally, 4-6 beers weekly; last drank 2 bottles of champagne yesterday     Social History   Substance and Sexual Activity  Drug Use Yes   Types: IV, Heroin, Marijuana, Other-see comments   Comment: using 1gm everyday heroin - for past 3 weeks    Additional Social History:                           Allergies:  No Known Allergies Lab Results:  Results for orders placed or performed during the hospital encounter of 04/28/21 (from the past 48 hour(s))  TSH     Status: None   Collection Time: 04/29/21  6:13 AM  Result Value Ref Range   TSH 0.392 0.350 - 4.500 uIU/mL    Comment: Performed by a 3rd Generation assay with a functional sensitivity of <=0.01 uIU/mL. Performed at [facility], [address], [place], [place] [address]   Hemoglobin A1c     Status: None   Collection Time: 04/29/21  6:13 AM  Result Value Ref Range   Hgb A1c MFr Bld 5.4 4.8 - 5.6 %    Comment: (NOTE) Pre diabetes:          5.7%-6.4%  Diabetes:              >6.4%  Glycemic control for   <7.0% adults with diabetes    Mean Plasma Glucose 108.28 mg/dL    Comment: Performed at [facility], [address] [place], [place] [address]  Lipid panel     Status: Abnormal   Collection Time: 04/29/21  6:13 AM  Result Value Ref Range   Cholesterol 202 (H) 0 - 200 mg/dL   Triglycerides 716 <967 mg/dL   HDL 63 >89 mg/dL   Total CHOL/HDL Ratio 3.2 RATIO   VLDL 27 0 - 40 mg/dL   LDL Cholesterol 381 (H) 0 - 99 mg/dL    Comment:        Total Cholesterol/HDL:CHD Risk Coronary Heart Disease Risk Table                     Men   Women  1/2 Average Risk   3.4   3.3  Average Risk       5.0   4.4  2 X Average Risk   9.6   7.1  3 X Average Risk  23.4   11.0        Use the calculated Patient Ratio above and the CHD Risk Table to determine the patient's CHD Risk.        ATP III CLASSIFICATION (LDL):  <100     mg/dL   Optimal  017-510  mg/dL   Near or  Above                    Optimal  130-159  mg/dL   Borderline  258-527  mg/dL   High  >782     mg/dL   Very High Performed at  [facility], [address] [place], [place] [address]     Blood Alcohol level:  Lab Results  Component Value Date   ETH 136 (H) 04/28/2021   ETH <10 03/19/2021    Metabolic Disorder Labs:  Lab Results  Component Value Date   HGBA1C 5.4 04/29/2021   MPG 108.28 04/29/2021   MPG 105 12/03/2020   No results found for: PROLACTIN Lab Results  Component Value Date   CHOL 202 (H) 04/29/2021   TRIG 134 04/29/2021   HDL 63 04/29/2021   CHOLHDL 3.2 04/29/2021   VLDL 27 04/29/2021   LDLCALC 112 (H) 04/29/2021   LDLCALC 96 12/03/2020    Current Medications: Current Facility-Administered Medications  Medication Dose Route Frequency Provider Last Rate Last Admin   acetaminophen (TYLENOL) tablet 500 mg  500 mg Oral Q6H PRN Comer Locket, MD       alum & mag hydroxide-simeth (MAALOX/MYLANTA) 200-200-20 MG/5ML suspension 30 mL  30 mL Oral Q4H PRN Starkes-Perry, Juel Burrow, FNP       dicyclomine (BENTYL) tablet 20 mg  20 mg Oral Q6H PRN Comer Locket, MD       hydrOXYzine (ATARAX/VISTARIL) tablet 25 mg  25 mg Oral Q6H PRN Mason Jim, Igor Bishop E, MD       loperamide (IMODIUM) capsule 2-4 mg  2-4 mg Oral PRN Comer Locket, MD       LORazepam (ATIVAN) tablet 1 mg  1 mg Oral Q6H PRN Mason Jim, Shannie Kontos E, MD       magnesium hydroxide (MILK OF MAGNESIA) suspension 30 mL  30 mL Oral Daily PRN Starkes-Perry, Juel Burrow, FNP       methocarbamol (ROBAXIN) tablet 500 mg  500 mg Oral Q8H PRN Comer Locket, MD       multivitamin with minerals tablet 1 tablet  1 tablet Oral Daily Mason Jim, Aila Terra E, MD       naproxen (NAPROSYN) tablet 500 mg  500 mg Oral BID PRN Comer Locket, MD       ondansetron (ZOFRAN-ODT) disintegrating tablet 4 mg  4 mg Oral Q6H PRN Mason Jim, Adrianna Dudas E, MD       pantoprazole (PROTONIX) EC tablet 40 mg  40 mg Oral Daily Mason Jim, Bright Spielmann E,  MD   40 mg at 04/29/21 1840   potassium chloride (KLOR-CON) packet 40 mEq  40 mEq Oral Once Comer Locket, MD       [START ON 04/30/2021] thiamine tablet 100 mg  100 mg Oral Daily Mason Jim, Saretta Dahlem E, MD       traZODone (DESYREL) tablet 50 mg  50 mg Oral QHS PRN Maryagnes Amos, FNP       venlafaxine XR (EFFEXOR-XR) 24 hr capsule 75 mg  75 mg Oral Q breakfast Maryagnes Amos, FNP   75 mg at 04/29/21 0827   PTA Medications: No medications prior to admission.    Musculoskeletal: Strength & Muscle Tone: within normal limits Gait & Station: normal Patient leans: N/A            Psychiatric Specialty Exam:  Presentation  General Appearance: Appropriate for Environment; Casual  Eye Contact:Fair  Speech:Clear and Coherent; Normal Rate  Speech Volume:Normal  Handedness:Right   Mood and Affect  Mood:anxious, dysphoric  Affect:Congruent; Depressed   Thought Process  Thought Processes:Coherent, goal directed, linear  Duration of Psychotic Symptoms: No data recorded Past Diagnosis of Schizophrenia or Psychoactive disorder: No  Descriptions of Associations:Intact  Orientation:Full (Time, Place and Person)  Thought  Content:Logical  Hallucinations:Denied Ideas of Reference:None  Suicidal Thoughts:Suicidal Thoughts: Yes, Passive SI Passive Intent and/or Plan: Without Intent; Without Plan; Without Means to Carry Out  Homicidal Thoughts:Homicidal Thoughts: No   Sensorium  Memory:Fair  Judgment:Fair to Poor  Insight:Lacking   Executive Functions  Concentration:Good  Attention Span:Good  Recall:Fair  Fund of Knowledge:Good  Language:Good   Psychomotor Activity  Psychomotor Activity:Psychomotor Activity: Normal   Assets  Assets:Communication Skills; Desire for Improvement; Leisure Time; Social Support   Sleep  Sleep:Sleep: Fair   Physical Exam Vitals and nursing note reviewed.  Constitutional:      Appearance: Normal  appearance. He is normal weight.  HENT:     Head: Normocephalic and atraumatic.  Pulmonary:     Effort: Pulmonary effort is normal.  Neurological:     General: No focal deficit present.     Mental Status: He is oriented to person, place, and time.     Cranial Nerves: No cranial nerve deficit.   Review of Systems  Respiratory:  Negative for shortness of breath.   Cardiovascular:  Negative for chest pain.  Gastrointestinal:  Negative for abdominal pain, constipation, diarrhea, heartburn, nausea and vomiting.  Neurological:  Negative for headaches.  Blood pressure (!) 143/99, pulse 90, temperature (!) 97.3 F (36.3 C), temperature source Oral, resp. rate 16, height 5\' 10"  (1.778 m), weight 72.6 kg, SpO2 99 %. Body mass index is 22.96 kg/m.  Treatment Plan Summary: Daily contact with patient to assess and evaluate symptoms and progress in treatment and Medication management  ASSESSMENT Patient is 30 yo w/ reported psychiatric history of opiate dependence, substance-induced mood disorder, depression, substance abuse presents to [facility] from [place] long ED due to worsening suicidal ideation.  Patient currently endorsing passive suicidal ideation.  Patient is able to contract for safety while on the unit.  Patient denies HI/AVH.  Given patient's multiple ED visits and psychiatric hospitalizations, there is concern for secondary gain with this patient given he is homeless.  Patient does appear eager to attend substance rehab and engage in therapy.  Patient even admits that he "has gotten used to his way of life".  Encouraged patient to figure out specifically what can be different this hospitalization in order to break this routine.  Patient verbalized agreement to this.  Patient does not want to add any psychiatric medications including gabapentin which had helped him prior [facility] hospitalization.  Patient is only interested in continuing his Effexor.  PLAN Psychiatric Problems MDD-recurrent severe  without psychosis (r/o substance-induced mood disorder, r/o bipolar disorder, unspecified) Opiate use d/o  Marijuana abuse - r/o cannabis use d/o Methamphetamine abuse episodic- r/o stimulant use d/o -Effexor 75 qd for depressive symptoms - Patient declines start of mood stabilizer or use of Neurontin to help with potential cravings - PRNS available for potential withdrawal for symptomatic management  Medical Problems TSH 0.392, A1c 5.4  Hypercholesterolemia -Cholesterol 202, LDL Cholesterol 112 -Recommend follow-up with PCP to trend  Hypokalemia -K: 3.4 -Klor-Con 40 meq, repeat BMP tomorrow morning  PRNs Tylenol 650 mg for mild pain Maalox/Mylanta 30 mL for indigestion Hydroxyzine 25 mg tid for anxiety Milk of Magnesia 30 mL for constipation Trazodone 50 mg for sleep  3. Safety and Monitoring: Voluntary admission to inpatient psychiatric unit for safety, stabilization and treatment Daily contact with patient to assess and evaluate symptoms and progress in treatment Patient's case to be discussed in multi-disciplinary team meeting Observation Level : q15 minute checks Vital signs: q12 hours Precautions: suicide, elopement, and assault  4. Discharge Planning: Social work and case management to assist with discharge planning and identification of hospital follow-up needs prior to discharge Estimated LOS: 3-5 days Discharge Concerns: Need to establish a safety plan; Medication compliance and effectiveness Discharge Goals: Return home with outpatient referrals for mental health follow-up including medication management/psychotherapy  Observation Level/Precautions:  15 minute checks  Laboratory:  CBC Chemistry Profile HbAIC  Psychotherapy:    Medications:    Consultations:    Discharge Concerns:    Estimated LOS:  Other:     Physician Treatment Plan for Primary Diagnosis: MDD (major depressive disorder), recurrent severe, without psychosis (HCC) Long Term Goal(s):  Improvement in symptoms so as ready for discharge  Short Term Goals: Ability to identify changes in lifestyle to reduce recurrence of condition will improve, Ability to verbalize feelings will improve, Ability to disclose and discuss suicidal ideas, Ability to demonstrate self-control will improve, Ability to identify and develop effective coping behaviors will improve, Ability to maintain clinical measurements within normal limits will improve, Compliance with prescribed medications will improve, and Ability to identify triggers associated with substance abuse/mental health issues will improve  Physician Treatment Plan for Secondary Diagnosis: Principal Problem:   MDD (major depressive disorder), recurrent severe, without psychosis (HCC) Active Problems:   Substance induced mood disorder (HCC)   Opiate abuse, continuous (HCC)   Hepatitis C  Long Term Goal(s): Improvement in symptoms so as ready for discharge  Short Term Goals: Ability to identify changes in lifestyle to reduce recurrence of condition will improve, Ability to verbalize feelings will improve, Ability to disclose and discuss suicidal ideas, Ability to demonstrate self-control will improve, Ability to identify and develop effective coping behaviors will improve, Ability to maintain clinical measurements within normal limits will improve, Compliance with prescribed medications will improve, and Ability to identify triggers associated with substance abuse/mental health issues will improve  I certify that inpatient services furnished can reasonably be expected to improve the patient's condition.    Park Pope, MD 10/25/20228:56 PM

## 2021-04-29 NOTE — Group Note (Signed)
Group Topic: Goal Setting  Group Date: 04/29/2021 Start Time: 0830 End Time: 0900 Facilitators: Margaret Pyle, NT  [facility]  Number of Participants: 14  Group Focus: activities of daily living skills and goals/reality orientation  Name: Jared Jordan Date of Birth: Feb 16, 1991  MR: [id number]    Level of Participation: Pt did not attend

## 2021-04-29 NOTE — [facility] Counselor (Addendum)
Adult Comprehensive Assessment   Patient ID: Jared Jordan, male   DOB: 28-Mar-1991, 30 y.o.   MRN: [id number]   Information Source: Information source: Patient   Current Stressors:  Patient states their primary concerns and needs for treatment are: Patient states that it is the aniversery of his father's death and he was having suicidal thoughts.  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  Physical health (include injuries & life threatening diseases): Denies any current stressors Social relationships: Denies any current stressors; "I have no friends" Substance abuse: Patient reports that he uses heroin (both snorting and injecting) daily, he uses about $20/day. Bereavement / Loss: Reports he continues to struggle with the death of his father. 1 year anniversary   Living/Environment/Situation:  Living Arrangements: Homeless, couch surfing Living conditions (as described by patient or guardian): Homeless Who else lives in the home?: Homeless How long has patient lived in current situation?: "about a month" What is atmosphere in current home: Temporary   Family History:  Marital status: Single Are you sexually active?: No What is your sexual orientation?: Heterosexual Has your sexual activity been affected by drugs, alcohol, medication, or emotional stress?: No Does patient have children?: No   Childhood History:  By whom was/is the patient raised?: Both parents Additional childhood  history information: Reports his parents were divorced when he was a child, however both parents were active Description of patient's relationship with caregiver when they were a child: Reports having a close relationship with both parents during his childhood Patient's description of current relationship with people who raised him/her: Reports having a distant relationship with his mother currently. Patient shared his father is currently deceased How were you disciplined when you got in trouble as a child/adolescent?: Spankings; Verbally Does patient have siblings?: Yes Number of Siblings: 3 Description of patient's current relationship with siblings: Reports having an "okay" relationship with his sisters, however he reports having a strained relationship with his brother Did patient suffer any verbal/emotional/physical/sexual abuse as a child?: No Did patient suffer from severe childhood neglect?: No Has patient ever been sexually abused/assaulted/raped as an adolescent or adult?: No Was the patient ever a victim of a crime or a disaster?: No Witnessed domestic violence?: No Has patient been effected by domestic violence as an adult?: No   Education:  Highest grade of school patient has completed: 12th grade; Some college Currently a student?: No Learning disability?: No   Employment/Work Situation:   Employment situation: Unemployed Patient's job has been impacted by current illness: No What is the longest time patient has a held a job?: 6 months 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?: No Are There Guns or Other Weapons in Your Home?: No   Financial Resources:   Financial resources: No income Does patient have a Lawyer or guardian?: No   Alcohol/Substance Abuse:   What has been your use of drugs/alcohol within the last 12 months?: reports  using about $20 worth  of Heroin daily. Patient denies using any other drugs or alcohol. If attempted suicide, did drugs/alcohol play a role in this?: Patient reports that he usually gets suicidal when he is not using drugs.  Alcohol/Substance Abuse Treatment Hx: Denies past history Has alcohol/substance abuse ever caused legal problems?: No   Social Support System:   Forensic psychologist System: None Describe Community Support System: "No one" Type of faith/religion: None How does patient's faith help to cope with current illness?: N/A   Leisure/Recreation:   Leisure and Hobbies: "Nothing currently"   Strengths/Needs:   What is the patient's perception of their strengths?: "Nothing currently" Patient states they can use these personal strengths during their treatment to contribute to their recovery: No Patient states these barriers may affect/interfere with their treatment: No Patient states these barriers may affect their return to the community: N/A Other important information patient would like considered in planning for their treatment: N/A   Discharge Plan:   Currently receiving community mental health services: No Patient states concerns and preferences for aftercare planning are: Patient expressed interest in residential treatment services at discharge Patient states they will know when they are safe and ready for discharge when: To be determined Does patient have access to transportation?: No Does patient have financial barriers related to discharge medications?: Yes Patient description of barriers related to discharge medications: No income; No health insurance Plan for no access to transportation at discharge: No Plan for living situation after discharge: Patient expressed interest in residential treatment Will patient be returning to same living situation after discharge?: No   Summary/Recommendations:   Summary and Recommendations (to be completed by the  evaluator): Jared Jordan is a 30 year old male who is diagnosed with Substance induced mood disorder. He presented to the hospital seeking treatment for suicidal thoughts associated with the anniversary of his father's death.  Patient also reports using about $20 worth of heroin a day. Patient also reports that he is unemployed and has no income. Patient currently homeless and couch surfing.  Jared Jordan states that he is very interested in residential treatment. Jared Jordan can benefit from crisis stabilization, medication management, therapeutic milieu and referral services.

## 2021-04-30 ENCOUNTER — Encounter (HOSPITAL_COMMUNITY): Payer: Self-pay

## 2021-04-30 LAB — BASIC METABOLIC PANEL
Anion gap: 9 (ref 5–15)
BUN: 16 mg/dL (ref 6–20)
CO2: 23 mmol/L (ref 22–32)
Calcium: 9 mg/dL (ref 8.9–10.3)
Chloride: 102 mmol/L (ref 98–111)
Creatinine, Ser: 0.61 mg/dL (ref 0.61–1.24)
GFR, Estimated: >60 mL/min (ref >60)
Glucose, Bld: 112 mg/dL — ABNORMAL HIGH (ref 70–99)
Potassium: 4 mmol/L (ref 3.5–5.1)
Sodium: 134 mmol/L — ABNORMAL LOW (ref 135–145)

## 2021-04-30 NOTE — Progress Notes (Signed)
[facility] MD Progress Note  04/30/2021 6:43 AM Jared Jordan  MRN:  [id number]  Chief Complaint: SI and addiction  Subjective: Jared Jordan is a 30 y.o. 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 The patient is currently on Hospital Day 2.   Chart Review from last 24 hours:  The patient's chart was reviewed and nursing notes were reviewed. The patient's case was discussed in multidisciplinary team meeting. Per nursing he has had no acute behavioral issues or safety concerns. Per [facility] he was compliant with Protonix and Effexor but refused MVI and did not receive K+. He received Naprosyn X1 for pain, Robaxin X1 for spasm, Trazodone X1 for sleep, Vistaril X1 for anxiety.  Information Obtained Today During Patient Interview: The patient was seen and evaluated on the unit. On assessment today the patient reports that he had passive SI this morning which has resolved, and he can contract for safety on the unit. He continues to endorse low mood. He denies HI, AVH, paranoia or delusions. He reports stable sleep and appetite. He states his withdrawal symptoms are improving today and he specifically denies GI sx, cramps, chills, body aches, sweats, yawning, or anxiety. He continues to decline start of any scheduled medications other than Effexor. I advised that his repeat BMP shows his K+ has corrected even without replacement but his sodium is slightly low. He was encouraged to work on improved po intake. He hopes to talk to social work about residential rehab options for discharge.   Principal Problem: MDD (major depressive disorder), recurrent severe, without psychosis (HCC) Diagnosis: Principal Problem:   MDD (major depressive disorder), recurrent severe, without psychosis (HCC) Active Problems:   Substance  induced mood disorder (HCC)   Opiate abuse, continuous (HCC)   Hepatitis C  Total Time Spent in Direct Patient Care:  I personally spent 30 minutes on the unit in direct patient care. The direct patient care time included face-to-face time with the patient, reviewing the patient's chart, communicating with other professionals, and coordinating care. Greater than 50% of this time was spent in counseling or coordinating care with the patient regarding goals of hospitalization, psycho-education, and discharge planning needs.  Past Psychiatric History: see H&P  Past Medical History:  Past Medical History:  Diagnosis Date   Drug use     Past Surgical History:  Procedure Laterality Date   SHOULDER SURGERY     SHOULDER SURGERY Left 2020   Family History: see H&P  Family Psychiatric  History: see H&P  Social History:  Social History   Substance and Sexual Activity  Alcohol Use Yes   Alcohol/week: 4.0 - 6.0 standard drinks   Types: 4 - 6 Cans of beer per week   Comment: normally, 4-6 beers weekly; last drank 2 bottles of champagne yesterday     Social History   Substance and Sexual Activity  Drug Use Yes   Types: IV, Heroin, Marijuana, Other-see comments   Comment: using 1gm everyday heroin - for past 3 weeks    Social History   Socioeconomic History   Marital status: Single    Spouse name: Not on file   Number of children: Not on file   Years of education: Not on file   Highest education level: Not on file  Occupational History   Not on file  Tobacco Use   Smoking  status: Every Day    Packs/day: 0.25    Years: 1.00    Pack years: 0.25    Types: Cigarettes   Smokeless tobacco: Never  Vaping Use   Vaping Use: Some days   Start date: 11/04/2018   Devices: Jewel Menthol  Substance and Sexual Activity   Alcohol use: Yes    Alcohol/week: 4.0 - 6.0 standard drinks    Types: 4 - 6 Cans of beer per week    Comment: normally, 4-6 beers weekly; last drank 2 bottles of  champagne yesterday   Drug use: Yes    Types: IV, Heroin, Marijuana, Other-see comments    Comment: using 1gm everyday heroin - for past 3 weeks   Sexual activity: Not Currently  Other Topics Concern   Not on file  Social History Narrative   Pt is grieving the death of his father last year. 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 Lives alone and counts his mom as social support. Has siblings but doesn't speak to them.   Social Determinants of Health   Financial Resource Strain: Not on file  Food Insecurity: Not on file  Transportation Needs: Not on file  Physical Activity: Not on file  Stress: Not on file  Social Connections: Not on file   Sleep: Good per patient report - slept 4.25 hours per staff  Appetite:  Good  Current Medications: Current Facility-Administered Medications  Medication Dose Route Frequency Provider Last Rate Last Admin   acetaminophen (TYLENOL) tablet 500 mg  500 mg Oral Q6H PRN Mason Jim, Nettie Cromwell E, MD       alum & mag hydroxide-simeth (MAALOX/MYLANTA) 200-200-20 MG/5ML suspension 30 mL  30 mL Oral Q4H PRN Starkes-Perry, Juel Burrow, FNP       dicyclomine (BENTYL) tablet 20 mg  20 mg Oral Q6H PRN Comer Locket, MD       hydrOXYzine (ATARAX/VISTARIL) tablet 25 mg  25 mg Oral Q6H PRN Comer Locket, MD   25 mg at 04/29/21 2141   loperamide (IMODIUM) capsule 2-4 mg  2-4 mg Oral PRN Comer Locket, MD       LORazepam (ATIVAN) tablet 1 mg  1 mg Oral Q6H PRN Mason Jim,  Askren E, MD       magnesium hydroxide (MILK OF MAGNESIA) suspension 30 mL  30 mL Oral Daily PRN Starkes-Perry, Juel Burrow, FNP       methocarbamol (ROBAXIN) tablet 500 mg  500 mg Oral Q8H PRN Mason Jim, Treyven Lafauci E, MD   500 mg at 04/29/21 2140   multivitamin with minerals tablet 1 tablet  1 tablet Oral Daily Mason Jim, Yoskar Murrillo E, MD       naproxen (NAPROSYN) tablet 500 mg  500 mg Oral BID PRN Bartholomew Crews E, MD   500 mg at 04/29/21 2141   ondansetron (ZOFRAN-ODT) disintegrating tablet 4 mg  4 mg Oral  Q6H PRN Comer Locket, MD       pantoprazole (PROTONIX) EC tablet 40 mg  40 mg Oral Daily Mason Jim, Kelsy Polack E, MD   40 mg at 04/29/21 1840   potassium chloride (KLOR-CON) packet 40 mEq  40 mEq Oral Once Bartholomew Crews E, MD       thiamine tablet 100 mg  100 mg Oral Daily Mason Jim, Rhyan Wolters E, MD       traZODone (DESYREL) tablet 50 mg  50 mg Oral QHS PRN Maryagnes Amos, FNP   50 mg at 04/29/21 2140   venlafaxine XR (EFFEXOR-XR) 24 hr capsule  75 mg  75 mg Oral Q breakfast Maryagnes Amos, FNP   75 mg at 04/29/21 2951    Lab Results:  Results for orders placed or performed during the hospital encounter of 04/28/21 (from the past 48 hour(s))  TSH     Status: None   Collection Time: 04/29/21  6:13 AM  Result Value Ref Range   TSH 0.392 0.350 - 4.500 uIU/mL    Comment: Performed by a 3rd Generation assay with a functional sensitivity of <=0.01 uIU/mL. Performed at [facility], [address] [place], [place] [address]   Hemoglobin A1c     Status: None   Collection Time: 04/29/21  6:13 AM  Result Value Ref Range   Hgb A1c MFr Bld 5.4 4.8 - 5.6 %    Comment: (NOTE) Pre diabetes:          5.7%-6.4%  Diabetes:              >6.4%  Glycemic control for   <7.0% adults with diabetes    Mean Plasma Glucose 108.28 mg/dL    Comment: Performed at [facility], [address] [place], [place] [address]  Lipid panel     Status: Abnormal   Collection Time: 04/29/21  6:13 AM  Result Value Ref Range   Cholesterol 202 (H) 0 - 200 mg/dL   Triglycerides 160 <109 mg/dL   HDL 63 >32 mg/dL   Total CHOL/HDL Ratio 3.2 RATIO   VLDL 27 0 - 40 mg/dL   LDL Cholesterol 355 (H) 0 - 99 mg/dL    Comment:        Total Cholesterol/HDL:CHD Risk Coronary Heart Disease Risk Table                     Men   Women  1/2 Average Risk   3.4   3.3  Average Risk       5.0   4.4  2 X Average Risk   9.6   7.1  3 X Average Risk  23.4   11.0        Use the calculated Patient  Ratio above and the CHD Risk Table to determine the patient's CHD Risk.        ATP III CLASSIFICATION (LDL):  <100     mg/dL   Optimal  732-202  mg/dL   Near or Above                    Optimal  130-159  mg/dL   Borderline  542-706  mg/dL   High  >237     mg/dL   Very High Performed at [facility], [address], [place], [place] [address]     Blood Alcohol level:  Lab Results  Component Value Date   ETH 136 (H) 04/28/2021   ETH <10 03/19/2021    Metabolic Disorder Labs: Lab Results  Component Value Date   HGBA1C 5.4 04/29/2021   MPG 108.28 04/29/2021   MPG 105 12/03/2020   No results found for: PROLACTIN Lab Results  Component Value Date   CHOL 202 (H) 04/29/2021   TRIG 134 04/29/2021   HDL 63 04/29/2021   CHOLHDL 3.2 04/29/2021   VLDL 27 04/29/2021   LDLCALC 112 (H) 04/29/2021   LDLCALC 96 12/03/2020    Musculoskeletal: Strength & Muscle Tone: within normal limits Gait & Station: normal Patient leans: N/A Psychiatric Specialty Exam: Physical Exam Vitals and nursing note  reviewed.  HENT:     Head: Normocephalic.  Pulmonary:     Effort: Pulmonary effort is normal.  Neurological:     General: No focal deficit present.     Mental Status: He is alert.    Review of Systems  Respiratory:  Negative for shortness of breath.   Cardiovascular:  Negative for chest pain.  Gastrointestinal:  Negative for diarrhea, nausea and vomiting.  Neurological:  Negative for headaches.   Blood pressure (!) 139/99, pulse 100, temperature 98 F (36.7 C), temperature source Oral, resp. rate 16, height 5\' 10"  (1.778 m), weight 72.6 kg, SpO2 97 %.Body mass index is 22.96 kg/m.  General Appearance:  casually dressed, well groomed  Eye Contact:  Good  Speech:  Clear and Coherent and Normal Rate  Volume:  Normal  Mood:  Dysphoric  Affect:  Constricted  Thought Process:  Goal Directed and Linear  Orientation:  Full (Time, Place, and Person)  Thought  Content:  Logical and denies AVH, paranoia or delusions  Suicidal Thoughts:  Yes.  without intent/plan - can contract for safety on the unit  Homicidal Thoughts:  No  Memory:  Recent;   Good  Judgement:  Fair  Insight:  Fair  Psychomotor Activity:  Normal  Concentration:  Concentration: Good and Attention Span: Good  Recall:  Good  Fund of Knowledge:  Good  Language:  Good  Akathisia:  Negative  Assets:  Communication Skills Desire for Improvement Resilience  ADL's:  Intact  Cognition:  WNL  Sleep:  Number of Hours: 4.25   Treatment Plan Summary: Diagnoses / Active Problems: 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 features (r/o substance induced mood d/o, r/o bipolar II) Opiate use d/o Methamphetamine use - episodic (r/o stimulant use d/o) Cannabis use - episodic (r/o cannabis use d/o) R/o alcohol use d/o  PLAN: Safety and Monitoring:  -- Voluntary admission to inpatient psychiatric unit for safety, stabilization and treatment  -- Daily contact with patient to assess and evaluate symptoms and progress in treatment  -- Patient's case to be discussed in multi-disciplinary team meeting  -- Observation Level : q15 minute checks  -- Vital signs:  q12 hours  -- Precautions: suicide, elopement, and assault  2. Psychiatric Diagnoses and Treatment:   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 features (r/o substance induced mood d/o, r/o bipolar II)  -- Continue Effexor XR 75mg  daily   -- patient refusing restart of Neurontin to help with potential cravings, mood, or anxiety and refusing restart of additional mood stabilizer at this time  -- Encouraged patient to participate in unit milieu and in scheduled group therapies   -- Short Term Goals: Ability to verbalize feelings will improve, Ability to demonstrate self-control will improve, and Ability to identify and develop effective coping behaviors will improve  -- Long Term Goals: Improvement in symptoms so as ready for  discharge   Opiate use d/o Methamphetamine use - episodic (r/o stimulant use d/o) Cannabis use - episodic (r/o cannabis use d/o) R/o alcohol use d/o -- Continue COWS monitoring - Recent COWS score 3 -- Continue CIWA monitoring with Ativan 1mg  for scores >10; CIWA score 2 today -- Continue PRNs for symptomatic treatment of withdrawal sx (Robaxin, Naprosyn, Zofran, Vistaril, Tylenol, Bentyl, Imodium) -- Continue Thiamine and MVI oral replacement and Ensure for nutritional support -- Continue Protonix 40mg  daily for GI protection -- SW to assist with SA treatment options after discharge  -- Short Term Goals: Ability to identify triggers associated with substance abuse/mental health issues will  improve  -- Long Term Goals: Improvement in symptoms so as ready for discharge   3. Medical Issues Being Addressed:   Hepatitis C  -- Will need outpatient f/u for treatment after discharge  -- AST 32 and ALT 36 and patient currently asymptomatic; discussed blood bourne precautions with patient   Hypokalemia - resolved  -- Patient did not receive K+ supplement - BMP this morning shows K+ 4.0   Hyponatremia  -- will encourage improved po intake/hydration and monitor with start of SNRI   Hypercholesterolemia  -- Will need outpatient management after discharge   4. Discharge Planning:   -- Social work and case management to assist with discharge planning and identification of hospital follow-up needs prior to discharge  -- Estimated LOS: 5-7 days  -- Discharge Concerns: Need to establish a safety plan; Medication compliance and effectiveness  -- Discharge Goals: Return home with outpatient referrals for mental health follow-up including medication management/psychotherapy   Comer Locket, MD, FAPA 04/30/2021, 6:43 AM

## 2021-04-30 NOTE — BH IP Treatment Plan (Signed)
Interdisciplinary Treatment and Diagnostic Plan Update  04/30/2021 Time of Session: 9:05am  Jared Jordan MRN: [id number]  Principal Diagnosis: MDD (major depressive disorder), recurrent severe, without psychosis (HCC)  Secondary Diagnoses: Principal Problem:   MDD (major depressive disorder), recurrent severe, without psychosis (HCC) Active Problems:   Substance induced mood disorder (HCC)   Opiate abuse, continuous (HCC)   Hepatitis C   Current Medications:  Current Facility-Administered Medications  Medication Dose Route Frequency Provider Last Rate Last Admin   acetaminophen (TYLENOL) tablet 500 mg  500 mg Oral Q6H PRN Singleton, Amy E, MD       alum & mag hydroxide-simeth (MAALOX/MYLANTA) 200-200-20 MG/5ML suspension 30 mL  30 mL Oral Q4H PRN Starkes-Perry, Takia S, FNP       dicyclomine (BENTYL) tablet 20 mg  20 mg Oral Q6H PRN Singleton, Amy E, MD       hydrOXYzine (ATARAX/VISTARIL) tablet 25 mg  25 mg Oral Q6H PRN Singleton, Amy E, MD   25 mg at 04/29/21 2141   loperamide (IMODIUM) capsule 2-4 mg  2-4 mg Oral PRN Singleton, Amy E, MD       LORazepam (ATIVAN) tablet 1 mg  1 mg Oral Q6H PRN Singleton, Amy E, MD       magnesium hydroxide (MILK OF MAGNESIA) suspension 30 mL  30 mL Oral Daily PRN Starkes-Perry, Takia S, FNP       methocarbamol (ROBAXIN) tablet 500 mg  500 mg Oral Q8H PRN Singleton, Amy E, MD   500 mg at 04/29/21 2140   multivitamin with minerals tablet 1 tablet  1 tablet Oral Daily Singleton, Amy E, MD   1 tablet at 04/30/21 0900   naproxen (NAPROSYN) tablet 500 mg  500 mg Oral BID PRN Singleton, Amy E, MD   500 mg at 04/29/21 2141   ondansetron (ZOFRAN-ODT) disintegrating tablet 4 mg  4 mg Oral Q6H PRN Singleton, Amy E, MD       pantoprazole (PROTONIX) EC tablet 40 mg  40 mg Oral Daily Singleton, Amy E, MD   40 mg at 04/30/21 0900   potassium chloride (KLOR-CON) packet 40 mEq  40 mEq Oral Once Singleton, Amy E, MD       thiamine tablet 100 mg  100 mg Oral  Daily Singleton, Amy E, MD   100 mg at 04/30/21 0900   traZODone (DESYREL) tablet 50 mg  50 mg Oral QHS PRN Starkes-Perry, Takia S, FNP   50 mg at 04/29/21 2140   venlafaxine XR (EFFEXOR-XR) 24 hr capsule 75 mg  75 mg Oral Q breakfast Starkes-Perry, Takia S, FNP   75 mg at 04/30/21 0900   PTA Medications: No medications prior to admission.    Patient Stressors: Educational concerns   Loss of father passed one year ago   Medication change or noncompliance   Substance abuse    Patient Strengths: Average or above average intelligence  Capable of independent living  Motivation for treatment/growth   Treatment Modalities: Medication Management, Group therapy, Case management,  1 to 1 session with clinician, Psychoeducation, Recreational therapy.   Physician Treatment Plan for Primary Diagnosis: MDD (major depressive disorder), recurrent severe, without psychosis (HCC) Long Term Goal(s): Improvement in symptoms so as ready for discharge   Short Term Goals: Ability to identify triggers associated with substance abuse/mental health issues will improve Ability to verbalize feelings will improve Ability to demonstrate self-control will improve Ability to identify and develop effective coping behaviors will improve  Medication Management: Evaluate patient's response, side   effects, and tolerance of medication regimen.  Therapeutic Interventions: 1 to 1 sessions, Unit Group sessions and Medication administration.  Evaluation of Outcomes: Not Met  Physician Treatment Plan for Secondary Diagnosis: Principal Problem:   MDD (major depressive disorder), recurrent severe, without psychosis (HCC) Active Problems:   Substance induced mood disorder (HCC)   Opiate abuse, continuous (HCC)   Hepatitis C  Long Term Goal(s): Improvement in symptoms so as ready for discharge   Short Term Goals: Ability to identify triggers associated with substance abuse/mental health issues will improve Ability to  verbalize feelings will improve Ability to demonstrate self-control will improve Ability to identify and develop effective coping behaviors will improve     Medication Management: Evaluate patient's response, side effects, and tolerance of medication regimen.  Therapeutic Interventions: 1 to 1 sessions, Unit Group sessions and Medication administration.  Evaluation of Outcomes: Not Met   RN Treatment Plan for Primary Diagnosis: MDD (major depressive disorder), recurrent severe, without psychosis (HCC) Long Term Goal(s): Knowledge of disease and therapeutic regimen to maintain health will improve  Short Term Goals: Ability to remain free from injury will improve, Ability to demonstrate self-control, Ability to participate in decision making will improve, Ability to verbalize feelings will improve, Ability to disclose and discuss suicidal ideas, and Ability to identify and develop effective coping behaviors will improve  Medication Management: RN will administer medications as ordered by provider, will assess and evaluate patient's response and provide education to patient for prescribed medication. RN will report any adverse and/or side effects to prescribing provider.  Therapeutic Interventions: 1 on 1 counseling sessions, Psychoeducation, Medication administration, Evaluate responses to treatment, Monitor vital signs and CBGs as ordered, Perform/monitor CIWA, COWS, AIMS and Fall Risk screenings as ordered, Perform wound care treatments as ordered.  Evaluation of Outcomes: Not Met   LCSW Treatment Plan for Primary Diagnosis: MDD (major depressive disorder), recurrent severe, without psychosis (HCC) Long Term Goal(s): Safe transition to appropriate next level of care at discharge, Engage patient in therapeutic group addressing interpersonal concerns.  Short Term Goals: Engage patient in aftercare planning with referrals and resources, Increase social support, Increase emotional regulation,  Facilitate acceptance of mental health diagnosis and concerns, Identify triggers associated with mental health/substance abuse issues, and Increase skills for wellness and recovery  Therapeutic Interventions: Assess for all discharge needs, 1 to 1 time with Social worker, Explore available resources and support systems, Assess for adequacy in community support network, Educate family and significant other(s) on suicide prevention, Complete Psychosocial Assessment, Interpersonal group therapy.  Evaluation of Outcomes: Not Met   Progress in Treatment: Attending groups: No. Participating in groups: No. Taking medication as prescribed: Yes. Toleration medication: Yes. Family/Significant other contact made: No, will contact:  Declined consents  Patient understands diagnosis: Yes. Discussing patient identified problems/goals with staff: Yes. Medical problems stabilized or resolved: Yes. Denies suicidal/homicidal ideation: Yes. Issues/concerns per patient self-inventory: No.   New problem(s) identified: No, Describe:  None   New Short Term/Long Term Goal(s): medication stabilization, elimination of SI thoughts, development of comprehensive mental wellness plan.   Patient Goals: "To work on my attitude and remain positive"   Discharge Plan or Barriers: Patient recently admitted. CSW will continue to follow and assess for appropriate referrals and possible discharge planning.   Reason for Continuation of Hospitalization: Depression Medication stabilization Suicidal ideation  Estimated Length of Stay: 3 to 5 days    Scribe for Treatment Team:  M , LCSWA 04/30/2021 2:22 PM 

## 2021-04-30 NOTE — Progress Notes (Signed)
D:  Patient denied SI and HI, contracts for safety.  Denied A/V hallucinations.   A:  Medications administered per MD orders.  Emotional support and encouragement given patient. R:  Safety maintained with 15 minute checks.  

## 2021-04-30 NOTE — Group Note (Signed)
LCSW Group Therapy Note   Group Date: 04/30/2021 Start Time: 1300 End Time: 1330 LCSW Group Therapy Note   Group Date: 04/30/2021 Start Time: 1300 End Time: 1330   Type of Therapy and Topic:  Group Therapy:   Therapy Type: Group Therapy  Participation Level:  Did Not Attend   Patients received a worksheet with an outline of 2 gingerbread men with a separation in the middle of the page. One sign designated what the pt sees about themselves and the other is what others see. Pts were asked to introduce themselves and share something they like about themself. Pts were then asked to draw, write or color how they view themselves as well as how they are viewed by others. CSW led discussion about the feelings and words associated with each side.   Patient Summary:    Pt did not attend.  Felizardo Hoffmann, LCSWA 04/30/2021  1:25 PM

## 2021-04-30 NOTE — Progress Notes (Signed)
Patient did attend the evening speaker NA meeting.  

## 2021-04-30 NOTE — Group Note (Signed)
Recreation Therapy Group Note   Group Topic:Stress Management  Group Date: 04/30/2021 Start Time: 0935 End Time: 0953 Facilitators: Caroll Rancher, LRT/CTRS Location: [address] Dayroom  Goal Area(s) Addresses:  Patient will actively participate in stress management techniques presented during session.  Patient will successfully identify benefit of practicing stress management post d/c.    Group Description: Guided Imagery. LRT provided education, instruction, and demonstration on practice of visualization via guided imagery. Patient was asked to participate in the technique introduced during session. Patients were given suggestions of ways to access scripts post d/c and encouraged to explore Youtube and other apps available on smartphones, tablets, and computers.   Affect/Mood: N/A   Participation Level: Did not attend    Clinical Observations/Individualized Feedback: Pt did not attend group.    Plan: Continue to engage patient in RT group sessions 2-3x/week.   Caroll Rancher, LRT/CTRS 04/30/2021 11:41 AM

## 2021-04-30 NOTE — Progress Notes (Signed)
Patient's self inventory sheet, patient has fair sleep, sleep medication helpful.  Good appetite, low energy level, poor concentration.  Rated depression, anxiety and hopeless 8.  Withdrawals, none.  SI sometimes, contracts for safety.  Denied physical problems.  Physical pain, worst pain #5 in past 24 hours.  Goal is positive attitude.  Plans to stay awake all day.  No discharge plans.

## 2021-04-30 NOTE — Plan of Care (Signed)
Nurse discussed anxiety and coping skills with patient. 

## 2021-05-01 NOTE — Progress Notes (Addendum)
[facility] Progress Note  05/01/2021 4:39 PM Jared Jordan  MRN:  [id number]  Chief Complaint: SI and addiction  Subjective: Jared Jordan is a 30 y.o. 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 The patient is currently on Hospital Day 3.   Chart Review from last 24 hours:  The patient's chart was reviewed and nursing notes were reviewed. The patient's case was discussed in multidisciplinary team meeting. Per nursing he has had no acute behavioral issues or safety concerns. Per [facility] he has been compliant with scheduled medications. He has received Naprosyn X1 for pain, Robaxin X1 for spasm, Trazodone X1 for sleep, Vistaril X2 for anxiety.  Information Obtained Today During Patient Interview: The patient was seen and evaluated on the unit in the presence of Dr. Mason Jim and Dr. Hazle Quant. On assessment today, the patient reports no suicidal ideation, intent or plan and can contract for safety on the unit. He denies HI, AVH, paranoia, delusions. He is sleeping and eating well. He has no reported withdrawal symptoms and no cravings, and he denied nausea, vomiting, headache, and chest pain.   He said he is feeling optimistic about the future, and he said his mindset now is much different from what it was during his previous hospitalization because "he went through a lot during the summer". He says he is looking forward to going to [facility] and wants to get as much as he can out of the experience this time. He was informed that he will need to demonstrate that he has been clean in order to get outpatient Hepatitis C treatment and he admits this will help motivate him for sobriety. He reports he has been clean for a period of 3 years in the past and believes he can do it again.   Principal Problem: MDD (major depressive  disorder), recurrent severe, without psychosis (HCC) Diagnosis: Principal Problem:   MDD (major depressive disorder), recurrent severe, without psychosis (HCC) Active Problems:   Substance induced mood disorder (HCC)   Opiate abuse, continuous (HCC)   Hepatitis C  Total Time Spent in Direct Patient Care:  I personally spent 30 minutes on the unit in direct patient care. The direct patient care time included face-to-face time with the patient, reviewing the patient's chart, communicating with other professionals, and coordinating care. Greater than 50% of this time was spent in counseling or coordinating care with the patient regarding goals of hospitalization, psycho-education, and discharge planning needs.  Past Psychiatric History: see H&P  Past Medical History:  Past Medical History:  Diagnosis Date   Drug use     Past Surgical History:  Procedure Laterality Date   SHOULDER SURGERY     SHOULDER SURGERY Left 2020   Family History: see H&P  Family Psychiatric  History: see H&P  Social History:  Social History   Substance and Sexual Activity  Alcohol Use Yes   Alcohol/week: 4.0 - 6.0 standard drinks   Types: 4 - 6 Cans of beer per week   Comment: normally, 4-6 beers weekly; last drank 2 bottles of champagne yesterday     Social History   Substance and Sexual Activity  Drug Use Yes   Types: IV, Heroin, Marijuana, Other-see comments   Comment: using 1gm everyday heroin - for past 3 weeks    Social History   Socioeconomic History   Marital status:  Single    Spouse name: Not on file   Number of children: Not on file   Years of education: Not on file   Highest education level: Not on file  Occupational History   Not on file  Tobacco Use   Smoking status: Every Day    Packs/day: 0.25    Years: 1.00    Pack years: 0.25    Types: Cigarettes   Smokeless tobacco: Never  Vaping Use   Vaping Use: Some days   Start date: 11/04/2018   Devices: Jewel Menthol  Substance  and Sexual Activity   Alcohol use: Yes    Alcohol/week: 4.0 - 6.0 standard drinks    Types: 4 - 6 Cans of beer per week    Comment: normally, 4-6 beers weekly; last drank 2 bottles of champagne yesterday   Drug use: Yes    Types: IV, Heroin, Marijuana, Other-see comments    Comment: using 1gm everyday heroin - for past 3 weeks   Sexual activity: Not Currently  Other Topics Concern   Not on file  Social History Narrative   Pt is grieving the death of his father last year. 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 Lives alone and counts his mom as social support. Has siblings but doesn't speak to them.   Social Determinants of Health   Financial Resource Strain: Not on file  Food Insecurity: Not on file  Transportation Needs: Not on file  Physical Activity: Not on file  Stress: Not on file  Social Connections: Not on file   Sleep: Good per patient report - slept 4.75 hours per staff  Appetite:  Good  Current Medications: Current Facility-Administered Medications  Medication Dose Route Frequency Provider Last Rate Last Admin   acetaminophen (TYLENOL) tablet 500 mg  500 mg Oral Q6H PRN Mason Jim, Evanee Lubrano E, MD       alum & mag hydroxide-simeth (MAALOX/MYLANTA) 200-200-20 MG/5ML suspension 30 mL  30 mL Oral Q4H PRN Starkes-Perry, Juel Burrow, FNP       dicyclomine (BENTYL) tablet 20 mg  20 mg Oral Q6H PRN Mason Jim, Willene Holian E, MD       hydrOXYzine (ATARAX/VISTARIL) tablet 25 mg  25 mg Oral Q6H PRN Mason Jim, Taviana Westergren E, MD   25 mg at 05/01/21 0957   loperamide (IMODIUM) capsule 2-4 mg  2-4 mg Oral PRN Comer Locket, MD       LORazepam (ATIVAN) tablet 1 mg  1 mg Oral Q6H PRN Mason Jim, Jevon Littlepage E, MD       magnesium hydroxide (MILK OF MAGNESIA) suspension 30 mL  30 mL Oral Daily PRN Starkes-Perry, Juel Burrow, FNP       methocarbamol (ROBAXIN) tablet 500 mg  500 mg Oral Q8H PRN Mason Jim, Giani Betzold E, MD   500 mg at 04/30/21 2134   multivitamin with minerals tablet 1 tablet  1 tablet Oral Daily  Bartholomew Crews E, MD   1 tablet at 05/01/21 0956   naproxen (NAPROSYN) tablet 500 mg  500 mg Oral BID PRN Comer Locket, MD   500 mg at 05/01/21 0958   ondansetron (ZOFRAN-ODT) disintegrating tablet 4 mg  4 mg Oral Q6H PRN Comer Locket, MD       pantoprazole (PROTONIX) EC tablet 40 mg  40 mg Oral Daily Mason Jim, Micahel Omlor E, MD   40 mg at 05/01/21 0956   potassium chloride (KLOR-CON) packet 40 mEq  40 mEq Oral Once Comer Locket, MD  thiamine tablet 100 mg  100 mg Oral Daily Mason Jim, Ashar Lewinski E, MD   100 mg at 05/01/21 0955   traZODone (DESYREL) tablet 50 mg  50 mg Oral QHS PRN Maryagnes Amos, FNP   50 mg at 04/30/21 2135   venlafaxine XR (EFFEXOR-XR) 24 hr capsule 75 mg  75 mg Oral Q breakfast Maryagnes Amos, FNP   75 mg at 05/01/21 9983    Lab Results:  Results for orders placed or performed during the hospital encounter of 04/28/21 (from the past 48 hour(s))  Basic metabolic panel     Status: Abnormal   Collection Time: 04/30/21  6:41 AM  Result Value Ref Range   Sodium 134 (L) 135 - 145 mmol/L   Potassium 4.0 3.5 - 5.1 mmol/L   Chloride 102 98 - 111 mmol/L   CO2 23 22 - 32 mmol/L   Glucose, Bld 112 (H) 70 - 99 mg/dL    Comment: Glucose reference range applies only to samples taken after fasting for at least 8 hours.   BUN 16 6 - 20 mg/dL   Creatinine, Ser 3.82 0.61 - 1.24 mg/dL   Calcium 9.0 8.9 - 50.5 mg/dL   GFR, Estimated >39 >76 mL/min    Comment: (NOTE) Calculated using the CKD-EPI Creatinine Equation (2021)    Anion gap 9 5 - 15    Comment: Performed at [facility], [address] [place], [place] [address]    Blood Alcohol level:  Lab Results  Component Value Date   ETH 136 (H) 04/28/2021   ETH <10 03/19/2021    Metabolic Disorder Labs: Lab Results  Component Value Date   HGBA1C 5.4 04/29/2021   MPG 108.28 04/29/2021   MPG 105 12/03/2020   No results found for: PROLACTIN Lab Results  Component Value Date   CHOL  202 (H) 04/29/2021   TRIG 134 04/29/2021   HDL 63 04/29/2021   CHOLHDL 3.2 04/29/2021   VLDL 27 04/29/2021   LDLCALC 112 (H) 04/29/2021   LDLCALC 96 12/03/2020    Musculoskeletal: Strength & Muscle Tone: within normal limits Gait & Station: normal Patient leans: N/A Psychiatric Specialty Exam: Physical Exam Vitals and nursing note reviewed.  HENT:     Head: Normocephalic.  Pulmonary:     Effort: Pulmonary effort is normal.  Neurological:     General: No focal deficit present.     Mental Status: He is alert.    Review of Systems  Respiratory:  Negative for shortness of breath.   Cardiovascular:  Negative for chest pain.  Gastrointestinal:  Negative for diarrhea, nausea and vomiting.  Neurological:  Negative for headaches.   Blood pressure (!) 158/100, pulse (!) 102, temperature 97.8 F (36.6 C), temperature source Oral, resp. rate 18, height 5\' 10"  (1.778 m), weight 72.6 kg, SpO2 99 %.Body mass index is 22.96 kg/m.  General Appearance:  casually dressed, well groomed  Eye Contact:  Good  Speech:  Clear and Coherent and Normal Rate  Volume:  Normal  Mood:  Optimistic  Affect:  Mood-congruent  Thought Process:  Goal Directed and Linear  Orientation:  Full (Time, Place, and Person)  Thought Content:  Logical and denies AVH, paranoia or delusions  Suicidal Thoughts: Denies current intent, plan or ideation and can contract for safety on the unit  Homicidal Thoughts:  No  Memory:  Recent;   Good  Judgement:  Fair  Insight:  Fair  Psychomotor Activity:  Normal  Concentration:  Concentration: Good and  Attention Span: Good  Recall:  Good  Fund of Knowledge:  Good  Language:  Good  Akathisia:  Negative  Assets:  Communication Skills Desire for Improvement Resilience  ADL's:  Intact  Cognition:  WNL  Sleep: Good   Treatment Plan Summary: Diagnoses / Active Problems: 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 features (r/o substance induced mood d/o, r/o bipolar  II) Opiate use d/o Methamphetamine use - episodic (r/o stimulant use d/o) Cannabis use - episodic (r/o cannabis use d/o) R/o alcohol use d/o  PLAN: Safety and Monitoring:  -- Voluntary admission to inpatient psychiatric unit for safety, stabilization and treatment  -- Daily contact with patient to assess and evaluate symptoms and progress in treatment  -- Patient's case to be discussed in multi-disciplinary team meeting  -- Observation Level : q15 minute checks  -- Vital signs:  q12 hours, BP and pulse rate both elevated this morning (158/100 and 102 respectively) so will continue monitoring  -- Precautions: suicide, elopement, and assault  2. Psychiatric Diagnoses and Treatment:   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 features (r/o substance induced mood d/o, r/o bipolar II)  -- Continue Effexor XR 75mg  daily   -- patient refusing restart of Neurontin to help with potential cravings, mood, or anxiety and refusing restart of additional mood stabilizer at this time  -- Encouraged patient to participate in unit milieu and in scheduled group therapies   -- Short Term Goals: Ability to verbalize feelings will improve, Ability to demonstrate self-control will improve, and Ability to identify and develop effective coping behaviors will improve  -- Long Term Goals: Improvement in symptoms so as ready for discharge   Opiate use d/o Methamphetamine use - episodic (r/o stimulant use d/o) Cannabis use - episodic (r/o cannabis use d/o) R/o alcohol use d/o -- Continue COWS monitoring - Recent COWS score 3,1,2 -- Continue CIWA monitoring with Ativan 1mg  for scores >10; CIWA score 0 today -- Continue PRNs for symptomatic treatment of withdrawal sx (Robaxin, Naprosyn, Zofran, Vistaril, Tylenol, Bentyl, Imodium) -- Continue Thiamine and MVI oral replacement and Ensure for nutritional support -- Continue Protonix 40mg  daily for GI protection -- SW reports patient is accepted to [facility] for residential  program Monday  -- Short Term Goals: Ability to identify triggers associated with substance abuse/mental health issues will improve  -- Long Term Goals: Improvement in symptoms so as ready for discharge   3. Medical Issues Being Addressed:   Hepatitis C  -- Will need outpatient f/u for treatment after discharge  -- AST 32 and ALT 36 and patient currently asymptomatic; discussed blood bourne precautions with patient   Hypokalemia - resolved  -- Patient did not receive K+ supplement - BMP yesterday morning shows K+ 4.0   Hyponatremia  -- BMP yesterday morning shows Na+ 134, will encourage improved po intake/hydration and monitor with start of SNRI with repeat BMP tomorrow morning    Hypercholesterolemia  -- Will need outpatient management after discharge   4. Discharge Planning:   -- Social work and case management to assist with discharge planning and identification of hospital follow-up needs prior to discharge  -- Estimated LOS: 5-7 days  -- Discharge Concerns: Need to establish a safety plan; Medication compliance and effectiveness  -- Discharge Goals: Return home with outpatient referrals for mental health follow-up including medication management/psychotherapy  MS-3  Attestation for Student Documentation:   I personally was present and performed or re-performed the history, physical exam and medical decision-making activities of this service and have  verified that the service and findings are accurately documented in the student's note. Bartholomew Crews, MD, FAPA   05/01/2021, 4:39 PM

## 2021-05-01 NOTE — [facility] Group Notes (Signed)
Adult Psychoeducational Group Note  Date:  05/01/2021 Time:  2:19 PM  Group Topic/Focus:  Wellness Toolbox:   The focus of this group is to discuss various aspects of wellness, balancing those aspects and exploring ways to increase the ability to experience wellness.  Patients will create a wellness toolbox for use upon discharge.  Participation Level:  Active  Participation Quality:  Appropriate  Affect:  Appropriate  Cognitive:  Appropriate  Insight: Appropriate  Engagement in Group:  Engaged  Modes of Intervention:  Activity  Additional Comments:  Patient attended and participated in the relaxation activity.  Jearl Klinefelter 05/01/2021, 2:19 PM

## 2021-05-01 NOTE — Progress Notes (Signed)
[facility] Group Notes:  (Nursing/MHT/Case Management/Adjunct)  Date:  05/01/2021  Time:  2015  Type of Therapy:   wrap up group  Participation Level:  Active  Participation Quality:  Appropriate, Attentive, Sharing, and Supportive  Affect:  Depressed  Cognitive:  Alert  Insight:  Improving  Engagement in Group:  Engaged  Modes of Intervention:  Clarification, Education, and Support  Summary of Progress/Problems: Positive thinking and positive change were discussed.   Marcille Buffy 05/01/2021, 9:56 PM

## 2021-05-01 NOTE — Progress Notes (Addendum)
Patient has been accepted to [facility] rehab program.  If patient does not have an ID, intake coordinator requested a letter from CSW for proof of residency in [facility].  Patient has an intake appointment set up for 9am on Monday.   Patient will need to be discharged via safe transport on Monday to get to [facility] by 9am.    Anson Oregon, LCSW, LCAS Clincal Social Worker  [facility]

## 2021-05-01 NOTE — Progress Notes (Signed)
D:  Aldric was pleasant and cooperative.  He attended evening AA group.  He reported passive SI earlier in the day but currently denied SI this evening.  No HI or AVH.  He reported feeling worried about taking some of the medications because "I think that they are making me more nervous."  He continues to toss and turn during the night.   A:  1:1 with RN for support and encouragement.  Medications given as ordered.  Q 15 minute checks maintained for safety.  Encouraged participation in group and unit activities.   R:  He is currently resting with his eyes closed and appears to be asleep.  He remains safe on the unit.  We will continue to monitor the progress towards his goals.   04/30/21 2134  Psych Admission Type (Psych Patients Only)  Admission Status Voluntary  Psychosocial Assessment  Patient Complaints Insomnia;Anxiety  Eye Contact Fair  Facial Expression Anxious  Affect Anxious  Speech Logical/coherent  Interaction Assertive  Motor Activity Other (Comment) (unremarkable)  Appearance/Hygiene Unremarkable  Behavior Characteristics Cooperative;Appropriate to situation  Mood Anxious  Thought Process  Coherency WDL  Content WDL  Delusions None reported or observed  Perception WDL  Hallucination None reported or observed  Judgment Poor  Confusion None  Danger to Self  Current suicidal ideation? Denies  Danger to Others  Danger to Others None reported or observed

## 2021-05-01 NOTE — [facility] Suicide Risk Assessment (Signed)
[facility] INPATIENT:  Family/Significant Other Suicide Prevention Education  Suicide Prevention Education:  Education Completed; Hulet Ehrmann, [phone] (name of family member/significant other) has been identified by the patient as the family member/significant other with whom the patient will be residing, and identified as the person(s) who will aid the patient in the event of a mental health crisis (suicidal ideations/suicide attempt).  With written consent from the patient, the family member/significant other has been provided the following suicide prevention education, prior to the and/or following the discharge of the patient.  CSW called mother. Mother reports that patient has had every opportunity to better himself but he has been his own worst enemy.  Mother reports that family has been very supportive in the past but he has been self destructive and manipulative. Mother reports patient is a pathological liar and can not/will not be able to support patient at this time. Mother reports that he will often go to these programs and leave and ask for more money or more support and family can not help anymore. CSW let mother know that patient will be going to [facility]. Mother understood.   The suicide prevention education provided includes the following: Suicide risk factors Suicide prevention and interventions National Suicide Hotline telephone number [facility] assessment telephone number [facility] Emergency Assistance 911 [facility] and/or Residential Mobile Crisis Unit telephone number  Request made of family/significant other to: Remove weapons (e.g., guns, rifles, knives), all items previously/currently identified as safety concern.   Remove drugs/medications (over-the-counter, prescriptions, illicit drugs), all items previously/currently identified as a safety concern.  The family member/significant other verbalizes understanding of the suicide  prevention education information provided.  The family member/significant other agrees to remove the items of safety concern listed above.  Willys Salvino E Krystie Leiter 05/01/2021, 3:00 PM

## 2021-05-02 LAB — BASIC METABOLIC PANEL
Anion gap: 8 (ref 5–15)
BUN: 19 mg/dL (ref 6–20)
CO2: 25 mmol/L (ref 22–32)
Calcium: 9.2 mg/dL (ref 8.9–10.3)
Chloride: 104 mmol/L (ref 98–111)
Creatinine, Ser: 0.65 mg/dL (ref 0.61–1.24)
GFR, Estimated: >60 mL/min (ref >60)
Glucose, Bld: 102 mg/dL — ABNORMAL HIGH (ref 70–99)
Potassium: 4.3 mmol/L (ref 3.5–5.1)
Sodium: 137 mmol/L (ref 135–145)

## 2021-05-02 NOTE — [facility] Group Notes (Signed)
The focus of this group is to help patients establish daily goals to achieve during treatment and discuss how the patient can incorporate goal setting into their daily lives to aide in recovery.  Pt attended morning goals group. 

## 2021-05-02 NOTE — Progress Notes (Signed)
   05/02/21 0830  Psych Admission Type (Psych Patients Only)  Admission Status Voluntary  Psychosocial Assessment  Patient Complaints Anxiety  Eye Contact Fair  Facial Expression Anxious  Affect Anxious  Speech Logical/coherent  Interaction Assertive  Motor Activity Other (Comment) (unremarkable)  Appearance/Hygiene Unremarkable  Behavior Characteristics Cooperative  Mood Anxious;Other (Comment) ("Energetic")  Thought Process  Coherency WDL  Content WDL  Delusions None reported or observed  Perception WDL  Hallucination None reported or observed  Judgment Poor  Confusion None  Danger to Self  Current suicidal ideation? Denies  Self-Injurious Behavior No self-injurious ideation or behavior indicators observed or expressed   Agreement Not to Harm Self Yes  Description of Agreement verbal agreement to not harm self on the unit  Danger to Others  Danger to Others None reported or observed   D: Pt alert and oriented. Rates anxiety 5/10 and depression 3/10 at this time. Pt  rates R ribs pain 5/10 at this time, Naproxen 500mg  po prn offered at 0812 w/ 2/10 pain reported at reassessment. Pt denies experiencing any SI/HI, or AVH at this time, describes his current mood as " Energetic."     A: Scheduled medications administered to pt, per MD orders. Support and encouragement provided. Frequent verbal contact made. Routine safety checks conducted q15 minutes.   R: No adverse drug reactions noted. Pt verbally contracts for safety at this time. Pt complaint with medications. Pt interacts appropriately with others on the unit. Pt remains safe at this time. Will continue to monitor.

## 2021-05-02 NOTE — Progress Notes (Signed)
   05/01/21 2313  Psych Admission Type (Psych Patients Only)  Admission Status Voluntary  Psychosocial Assessment  Patient Complaints None  Eye Contact Fair  Facial Expression Anxious  Affect Anxious  Speech Logical/coherent  Interaction Assertive  Motor Activity Other (Comment) (unremarkable)  Appearance/Hygiene Unremarkable  Behavior Characteristics Cooperative;Calm  Mood Pleasant  Thought Process  Coherency WDL  Content WDL  Delusions None reported or observed  Perception WDL  Hallucination None reported or observed  Judgment Poor  Confusion None  Danger to Self  Current suicidal ideation? Denies  Self-Injurious Behavior No self-injurious ideation or behavior indicators observed or expressed   Agreement Not to Harm Self Yes  Description of Agreement verbal agreement to not harm self on the unit  Danger to Others  Danger to Others None reported or observed

## 2021-05-02 NOTE — [facility] Group Notes (Signed)
Engaged in Psycho-ed group today by participating with the musical program on self love and self-care. Educated patient on ways that promotes self-care and self-love.

## 2021-05-02 NOTE — Progress Notes (Signed)
CSW faxed letter for proof of residency to intake coordinator of [name]. CSW also provided proof of residency letter to patient for patient records and intake.    Denyla Cortese, LCSW, LCAS Clincal Social Worker  [facility]

## 2021-05-02 NOTE — Progress Notes (Addendum)
Jared [facility] Physician Progress Note  05/02/2021 12:02 PM Jared Jordan  MRN:  [id number]  Chief Complaint: SI and addiction  Subjective: Jared Jordan is a 30 y.o. 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 The patient is currently on [facility].   Chart Review from last 24 hours:  Chart Review, 24 hr Events: The patient's chart was reviewed and nursing notes were reviewed. The patient's case was discussed in multidisciplinary team meeting.  Per MAR: - Patient is not compliant with scheduled meds. - PRNs: hydroxyzine Per RN notes, no documented behavioral issues and is attending group. Patient slept, 4.75 hours  Patient had the following psychiatric recommendations yesterday: - Continue Effexor XR 75mg  daily   Information Obtained Today During Patient Interview: Patient seen and assessed attending Dr. Mason Jim.    Patient states he is doing "much better" today. Patient states he is sleeping well, eating more appropriately.  Patient insists that he plans to stick to [facility] program this time around.  Patient states that he is "passed that point in my life".  Patient states that he will entirely abstain from substance use.  Patient has no complaints at this time including cravings, tremors, physical complaints.  Patient states that he is working on attending group regularly.    Patient denies present SI/HI/AVH.  Patient denies paranoia, first rank symptoms, ideas of reference, delusions, thought broadcasting, thought insertion/extraction.  Principal Problem: MDD (major depressive disorder), recurrent severe, without psychosis (HCC) Diagnosis: Principal Problem:   MDD (major depressive disorder), recurrent severe, without psychosis (HCC) Active Problems:   Substance induced mood disorder  (HCC)   Opiate abuse, continuous (HCC)   Hepatitis C  Total Time Spent in Direct Patient Care:  I personally spent 30 minutes on the unit in direct patient care. The direct patient care time included face-to-face time with the patient, reviewing the patient's chart, communicating with other professionals, and coordinating care. Greater than 50% of this time was spent in counseling or coordinating care with the patient regarding goals of hospitalization, psycho-education, and discharge planning needs.  Past Psychiatric History: see H&P  Past Medical History:  Past Medical History:  Diagnosis Date   Drug use     Past Surgical History:  Procedure Laterality Date   SHOULDER SURGERY     SHOULDER SURGERY Left 2020   Family History: see H&P  Family Psychiatric  History: see H&P  Social History:  Social History   Substance and Sexual Activity  Alcohol Use Yes   Alcohol/week: 4.0 - 6.0 standard drinks   Types: 4 - 6 Cans of beer per week   Comment: normally, 4-6 beers weekly; last drank 2 bottles of champagne yesterday     Social History   Substance and Sexual Activity  Drug Use Yes   Types: IV, Heroin, Marijuana, Other-see comments   Comment: using 1gm everyday heroin - for past 3 weeks    Social History   Socioeconomic History   Marital status: Single    Spouse name: Not on file   Number of children: Not on file   Years of education: Not on file   Highest education level: Not on file  Occupational History   Not on file  Tobacco Use   Smoking status: Every Day    Packs/day: 0.25    Years: 1.00    Pack years:  0.25    Types: Cigarettes   Smokeless tobacco: Never  Vaping Use   Vaping Use: Some days   Start date: 11/04/2018   Devices: Jewel Menthol  Substance and Sexual Activity   Alcohol use: Yes    Alcohol/week: 4.0 - 6.0 standard drinks    Types: 4 - 6 Cans of beer per week    Comment: normally, 4-6 beers weekly; last drank 2 bottles of champagne yesterday   Drug  use: Yes    Types: IV, Heroin, Marijuana, Other-see comments    Comment: using 1gm everyday heroin - for past 3 weeks   Sexual activity: Not Currently  Other Topics Concern   Not on file  Social History Narrative   Pt is grieving the death of his father last year. 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 Lives alone and counts his mom as social support. Has siblings but doesn't speak to them.   Social Determinants of Health   Financial Resource Strain: Not on file  Food Insecurity: Not on file  Transportation Needs: Not on file  Physical Activity: Not on file  Stress: Not on file  Social Connections: Not on file   Sleep: Good per patient report - slept 4.75 hours per staff  Appetite:  Good  Current Medications: Current Facility-Administered Medications  Medication Dose Route Frequency Provider Last Rate Last Admin   acetaminophen (TYLENOL) tablet 500 mg  500 mg Oral Q6H PRN Mason Jim, Rajinder Mesick E, MD       alum & mag hydroxide-simeth (MAALOX/MYLANTA) 200-200-20 MG/5ML suspension 30 mL  30 mL Oral Q4H PRN Starkes-Perry, Juel Burrow, FNP       dicyclomine (BENTYL) tablet 20 mg  20 mg Oral Q6H PRN Mason Jim, Crixus Mcaulay E, MD       hydrOXYzine (ATARAX/VISTARIL) tablet 25 mg  25 mg Oral Q6H PRN Mason Jim, Percell Lamboy E, MD   25 mg at 05/01/21 2106   loperamide (IMODIUM) capsule 2-4 mg  2-4 mg Oral PRN Comer Locket, MD       LORazepam (ATIVAN) tablet 1 mg  1 mg Oral Q6H PRN Mason Jim, Aarthi Uyeno E, MD       magnesium hydroxide (MILK OF MAGNESIA) suspension 30 mL  30 mL Oral Daily PRN Starkes-Perry, Juel Burrow, FNP       methocarbamol (ROBAXIN) tablet 500 mg  500 mg Oral Q8H PRN Mason Jim, Jeshurun Oaxaca E, MD   500 mg at 05/01/21 2106   multivitamin with minerals tablet 1 tablet  1 tablet Oral Daily Bartholomew Crews E, MD   1 tablet at 05/02/21 1443   naproxen (NAPROSYN) tablet 500 mg  500 mg Oral BID PRN Comer Locket, MD   500 mg at 05/02/21 0812   ondansetron (ZOFRAN-ODT) disintegrating tablet 4 mg  4 mg Oral Q6H  PRN Comer Locket, MD       pantoprazole (PROTONIX) EC tablet 40 mg  40 mg Oral Daily Mason Jim, Sala Tague E, MD   40 mg at 05/02/21 1540   potassium chloride (KLOR-CON) packet 40 mEq  40 mEq Oral Once Bartholomew Crews E, MD       thiamine tablet 100 mg  100 mg Oral Daily Mason Jim, Samuella Rasool E, MD   100 mg at 05/02/21 0808   traZODone (DESYREL) tablet 50 mg  50 mg Oral QHS PRN Maryagnes Amos, FNP   50 mg at 05/01/21 2106   venlafaxine XR (EFFEXOR-XR) 24 hr capsule 75 mg  75 mg Oral Q breakfast Starkes-Perry, Juel Burrow, FNP  75 mg at 05/02/21 5621    Lab Results:  Results for orders placed or performed during the [facility] encounter of 04/28/21 (from the past 48 hour(s))  Basic metabolic panel     Status: Abnormal   Collection Time: 05/02/21  6:19 AM  Result Value Ref Range   Sodium 137 135 - 145 mmol/L   Potassium 4.3 3.5 - 5.1 mmol/L   Chloride 104 98 - 111 mmol/L   CO2 25 22 - 32 mmol/L   Glucose, Bld 102 (H) 70 - 99 mg/dL    Comment: Glucose reference range applies only to samples taken after fasting for at least 8 hours.   BUN 19 6 - 20 mg/dL   Creatinine, Ser 3.08 0.61 - 1.24 mg/dL   Calcium 9.2 8.9 - 65.7 mg/dL   GFR, Estimated >84 >69 mL/min    Comment: (NOTE) Calculated using the CKD-EPI Creatinine Equation (2021)    Anion gap 8 5 - 15    Comment: Performed at [facility], [address] [place], [place] [address]    Blood Alcohol level:  Lab Results  Component Value Date   ETH 136 (H) 04/28/2021   ETH <10 03/19/2021    Metabolic Disorder Labs: Lab Results  Component Value Date   HGBA1C 5.4 04/29/2021   MPG 108.28 04/29/2021   MPG 105 12/03/2020   No results found for: PROLACTIN Lab Results  Component Value Date   CHOL 202 (H) 04/29/2021   TRIG 134 04/29/2021   HDL 63 04/29/2021   CHOLHDL 3.2 04/29/2021   VLDL 27 04/29/2021   LDLCALC 112 (H) 04/29/2021   LDLCALC 96 12/03/2020    Musculoskeletal: Strength & Muscle Tone: within normal  limits Gait & Station: normal Patient leans: N/A Psychiatric Specialty Exam: Physical Exam Vitals and nursing note reviewed.  HENT:     Head: Normocephalic.  Pulmonary:     Effort: Pulmonary effort is normal.  Neurological:     General: No focal deficit present.     Mental Status: He is alert.    Review of Systems  Respiratory:  Negative for shortness of breath.   Cardiovascular:  Negative for chest pain.  Gastrointestinal:  Negative for diarrhea, nausea and vomiting.  Neurological:  Negative for headaches.   Blood pressure 125/90, pulse 88, temperature 98.1 F (36.7 C), resp. rate 18, height 5\' 10"  (1.778 m), weight 72.6 kg, SpO2 100 %.Body mass index is 22.96 kg/m.  General Appearance:  casually dressed, well groomed  Eye Contact:  Good  Speech:  Clear and Coherent and Normal Rate  Volume:  Normal  Mood:  Optimistic  Affect:  Mood-congruent  Thought Process:  Goal Directed and Linear  Orientation:  Full (Time, Place, and Person)  Thought Content:  Logical and denies AVH, paranoia or delusions  Suicidal Thoughts: Denies current intent, plan or ideation and can contract for safety on the unit  Homicidal Thoughts:  No  Memory:  Recent;   Good  Judgement:  Fair  Insight:  Fair  Psychomotor Activity:  Normal  Concentration:  Concentration: Good and Attention Span: Good  Recall:  Good  Fund of Knowledge:  Good  Language:  Good  Akathisia:  Negative  Assets:  Communication Skills Desire for Improvement Resilience  ADL's:  Intact  Cognition:  WNL   Treatment Plan Summary: Diagnoses / Active Problems: 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 features (r/o substance induced mood d/o, r/o bipolar II) Opiate use d/o Methamphetamine use - episodic (r/o stimulant use d/o)  Cannabis use - episodic (r/o cannabis use d/o) R/o alcohol use d/o  PLAN: Safety and Monitoring:  -- Voluntary admission to inpatient psychiatric unit for safety, stabilization and treatment  -- Daily  contact with patient to assess and evaluate symptoms and progress in treatment  -- Patient's case to be discussed in multi-disciplinary team meeting  -- Observation Level : q15 minute checks  -- Vital signs:  q12 hours, BP and pulse rate both elevated this morning (158/100 and 102 respectively) so will continue monitoring  -- Precautions: suicide, elopement, and assault  2. Psychiatric Diagnoses and Treatment:   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 features (r/o substance induced mood d/o, r/o bipolar II)  -- Continue Effexor XR 75mg  daily   -- patient refusing restart of Neurontin to help with potential cravings, mood, or anxiety and refusing restart of additional mood stabilizer at this time  -- Encouraged patient to participate in unit milieu and in scheduled group therapies   -- Short Term Goals: Ability to verbalize feelings will improve, Ability to demonstrate self-control will improve, and Ability to identify and develop effective coping behaviors will improve  -- Long Term Goals: Improvement in symptoms so as ready for discharge   Opiate use d/o Methamphetamine use - episodic (r/o stimulant use d/o) Cannabis use - episodic (r/o cannabis use d/o) R/o alcohol use d/o -- Continue COWS monitoring - Recent COWS score 3,1,2 -- Continue CIWA monitoring with Ativan 1mg  for scores >10; CIWA score 1 today -- Continue PRNs for symptomatic treatment of withdrawal sx (Robaxin, Naprosyn, Zofran, Vistaril, Tylenol, Bentyl, Imodium) -- Continue Thiamine and MVI oral replacement and Ensure for nutritional support -- Continue Protonix 40mg  daily for GI protection -- SW reports patient is accepted to [facility] for residential program Monday  -- Short Term Goals: Ability to identify triggers associated with substance abuse/mental health issues will improve  -- Long Term Goals: Improvement in symptoms so as ready for discharge   3. Medical Issues Being Addressed:   Hepatitis C  -- Will need  outpatient f/u for treatment after discharge  -- AST 32 and ALT 36 and patient currently asymptomatic; discussed blood bourne precautions with patient   Hypokalemia - resolved  -- Patient did not receive K+ supplement - BMP yesterday morning shows K+ 4.0   Hyponatremia - resolved  -- BMP Na 134 -> 137 (10/28)   Hypercholesterolemia  -- Will need outpatient management after discharge   4. Discharge Planning:   -- Social work and case management to assist with discharge planning and identification of [facility] follow-up needs prior to discharge  -- Estimated LOS: 5-7 days  -- Discharge Concerns: Need to establish a safety plan; Medication compliance and effectiveness  -- Discharge Goals: Return home with outpatient referrals for mental health follow-up including medication management/psychotherapy  [facility], MD 05/02/2021, 12:02 PM

## 2021-05-02 NOTE — [facility] Group Notes (Signed)
Pt attended Relaxation Group.  

## 2021-05-02 NOTE — Group Note (Addendum)
LCSW Group Therapy Note   Group Date: 05/02/2021 Start Time: 1300 End Time: 1400   Type of Therapy and Topic:  Group Therapy: Coping Skills  Participation Level:  Active  Topic:   Due to the acuity and complex discharge plans, group was not held. Patient was provided therapeutic worksheets and asked to meet with CSW as needed.  Jared [facility] M Ijeoma Loor, LCSWA 05/02/2021  2:47 PM    

## 2021-05-03 NOTE — Progress Notes (Signed)
[facility] Group Notes:  (Nursing/MHT/Case Management/Adjunct)  Date:  05/03/2021  Time:  9:50 PM  Type of Therapy:  Group Therapy  Participation Level:  Active  Participation Quality:  Appropriate, Attentive, Sharing, and Supportive  Affect:  Appropriate  Cognitive:  Alert, Appropriate, and Oriented  Insight:  Appropriate and Good  Engagement in Group:  Engaged and Supportive  Modes of Intervention:  Discussion  Summary of Progress/Problems: Pt reported a "good day". Informed that he's being discharged Monday to a treatment facility, and is happy that the doctors are weaning him off his medications.  J[facility] 05/03/2021, 9:50 PM

## 2021-05-03 NOTE — [facility] Group Notes (Signed)
.  Psychoeducational Group Note  Date: 05/01/2021 Time: 0900-1000    Goal Setting   Purpose of Group: This group helps to provide patients with the steps of setting a goal that is specific, measurable, attainable, realistic and time specific. A discussion on how we keep ourselves stuck with negative self talk.    Participation Level:  Active  Participation Quality:  Appropriate  Affect:  Appropriate  Cognitive:  Appropriate  Insight:  Improving  Engagement in Group:  Engaged  Additional Comments:  Pt rates his energy at a 8.5/10. States that he is here to "change my life". Goal is for 30 positives things about himself  Dione Housekeeper

## 2021-05-03 NOTE — Progress Notes (Signed)
   05/02/21 2120  Psych Admission Type (Psych Patients Only)  Admission Status Voluntary  Psychosocial Assessment  Patient Complaints None  Eye Contact Fair  Facial Expression Flat  Affect Anxious  Speech Logical/coherent  Interaction Assertive  Motor Activity Other (Comment) (unremarkable)  Appearance/Hygiene Unremarkable  Behavior Characteristics Cooperative;Appropriate to situation  Mood Pleasant  Thought Process  Coherency WDL  Content WDL  Delusions None reported or observed  Perception WDL  Hallucination None reported or observed  Judgment Poor  Confusion None  Danger to Self  Current suicidal ideation? Denies  Self-Injurious Behavior No self-injurious ideation or behavior indicators observed or expressed   Agreement Not to Harm Self Yes  Description of Agreement verbal agreement to not harm self on the unit  Danger to Others  Danger to Others None reported or observed

## 2021-05-03 NOTE — Group Note (Signed)
Group Topic: Goal Setting  Group Date: 05/03/2021 Start Time: 0900 End Time: 1000 Facilitators: Guss Bunde  [facility]  Number of Participants: 5  Group Focus: goals/reality orientation Treatment Modality:  Skills Training Interventions utilized were orientation Purpose: reinforce self-care  Name: Jared Jordan Date of Birth: 12-Dec-1990  MR: [id number]    Level of Participation: moderate Quality of Participation: engaged Interactions with others: gave feedback Mood/Affect: appropriate Triggers (if applicable):  Cognition:  Progress: Moderate Response:  Plan: patient will be encouraged to work on goals.  Patients Problems: Group was conducted by Thayer Ohm RN Patient Active Problem List   Diagnosis Date Noted   Opiate abuse, continuous (HCC) 04/29/2021   Hepatitis C 04/29/2021   MDD (major depressive disorder), recurrent episode (HCC) 03/14/2021   MDD (major depressive disorder), recurrent severe, without psychosis (HCC) 02/08/2021   MDD (major depressive disorder), recurrent episode, severe (HCC) 11/30/2020   Major depressive disorder, recurrent severe without psychotic features (HCC)    Substance induced mood disorder (HCC) 11/22/2019

## 2021-05-03 NOTE — Progress Notes (Signed)
[facility] Group Notes:  (Nursing/MHT/Case Management/Adjunct)  Date:  05/03/2021  Time:  12:19 AM  Type of Therapy:   AA  Participation Level:  Active  Participation Quality:  Appropriate and Supportive  Affect:  Appropriate  Cognitive:  Alert, Appropriate, and Oriented  Insight:  Appropriate  Engagement in Group:  Engaged  Modes of Intervention:  Discussion  Summary of Progress/Problems: Pt listened and participated when prompted by the group facilitators.   Jared Jordan 05/03/2021, 12:19 AM

## 2021-05-03 NOTE — Progress Notes (Addendum)
[facility] Physician Progress Note  05/03/2021 7:37 AM Jared Jordan  MRN:  [id number]  Chief Complaint: SI and addiction  Subjective: Jared Jordan is a 30 y.o. 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 The patient is currently on Hospital Day 5.   Chart Review from last 24 hours:  Chart Review, 24 hr Events: The patient's chart was reviewed and nursing notes were reviewed. The patient's case was discussed in multidisciplinary team meeting. Per nursing, he has had no acute safety concerns or behavioral issues noted. He attended groups. Per [facility], he was complaint with scheduled medications and did not require PRNs for anxiety, agitation or sleep. He did receive Naprosyn X1 for pain.   Information Obtained Today During Patient Interview: On interview today, the patient states he slept well without need for Trazodone. He initially had trouble falling asleep but states once asleep he slept fine. He voices no signs of withdrawal or acute cravings today. He is looking forward to transition to [facility] residential program on Monday. He denies SI, HI, AVH, paranoia, or delusions.He reports stable appetite and denies medication side-effects. He notes that he feels more alert and mentally stimulated on Effexor but denies sense of mood elevation, grandiosity or racing thoughts.  He voices no physical complaints today. He feels his depression is improving.  Principal Problem: MDD (major depressive disorder), recurrent severe, without psychosis (HCC) Diagnosis: Principal Problem:   MDD (major depressive disorder), recurrent severe, without psychosis (HCC) Active Problems:   Substance induced mood disorder (HCC)   Opiate abuse, continuous (HCC)   Hepatitis C  Total Time Spent in Direct Patient Care:  I personally  spent 20 minutes on the unit in direct patient care. The direct patient care time included face-to-face time with the patient, reviewing the patient's chart, communicating with other professionals, and coordinating care. Greater than 50% of this time was spent in counseling or coordinating care with the patient regarding goals of hospitalization, psycho-education, and discharge planning needs.  Past Psychiatric History: see H&P  Past Medical History:  Past Medical History:  Diagnosis Date   Drug use     Past Surgical History:  Procedure Laterality Date   SHOULDER SURGERY     SHOULDER SURGERY Left 2020   Family History: see H&P  Family Psychiatric  History: see H&P  Social History:  Social History   Substance and Sexual Activity  Alcohol Use Yes   Alcohol/week: 4.0 - 6.0 standard drinks   Types: 4 - 6 Cans of beer per week   Comment: normally, 4-6 beers weekly; last drank 2 bottles of champagne yesterday     Social History   Substance and Sexual Activity  Drug Use Yes   Types: IV, Heroin, Marijuana, Other-see comments   Comment: using 1gm everyday heroin - for past 3 weeks    Social History   Socioeconomic History   Marital status: Single    Spouse name: Not on file   Number of children: Not on file   Years of education: Not on file   Highest education level: Not on file  Occupational History   Not on file  Tobacco Use   Smoking status: Every Day    Packs/day: 0.25    Years: 1.00    Pack years: 0.25    Types: Cigarettes   Smokeless tobacco: Never  Vaping Use  Vaping Use: Some days   Start date: 11/04/2018   Devices: Jewel Menthol  Substance and Sexual Activity   Alcohol use: Yes    Alcohol/week: 4.0 - 6.0 standard drinks    Types: 4 - 6 Cans of beer per week    Comment: normally, 4-6 beers weekly; last drank 2 bottles of champagne yesterday   Drug use: Yes    Types: IV, Heroin, Marijuana, Other-see comments    Comment: using 1gm everyday heroin - for past 3  weeks   Sexual activity: Not Currently  Other Topics Concern   Not on file  Social History Narrative   Pt is grieving the death of his father last year. 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 Lives alone and counts his mom as social support. Has siblings but doesn't speak to them.   Social Determinants of Health   Financial Resource Strain: Not on file  Food Insecurity: Not on file  Transportation Needs: Not on file  Physical Activity: Not on file  Stress: Not on file  Social Connections: Not on file   Sleep: Good per patient report - 6.75 hours documented  Appetite:  Good  Current Medications: Current Facility-Administered Medications  Medication Dose Route Frequency Provider Last Rate Last Admin   acetaminophen (TYLENOL) tablet 500 mg  500 mg Oral Q6H PRN Mason Jim, Shannara Winbush E, MD       alum & mag hydroxide-simeth (MAALOX/MYLANTA) 200-200-20 MG/5ML suspension 30 mL  30 mL Oral Q4H PRN Starkes-Perry, Juel Burrow, FNP       dicyclomine (BENTYL) tablet 20 mg  20 mg Oral Q6H PRN Mason Jim, Anniston Nellums E, MD       magnesium hydroxide (MILK OF MAGNESIA) suspension 30 mL  30 mL Oral Daily PRN Starkes-Perry, Juel Burrow, FNP       methocarbamol (ROBAXIN) tablet 500 mg  500 mg Oral Q8H PRN Mason Jim, Tery Hoeger E, MD   500 mg at 05/01/21 2106   multivitamin with minerals tablet 1 tablet  1 tablet Oral Daily Comer Locket, MD   1 tablet at 05/02/21 9150   naproxen (NAPROSYN) tablet 500 mg  500 mg Oral BID PRN Comer Locket, MD   500 mg at 05/02/21 5697   pantoprazole (PROTONIX) EC tablet 40 mg  40 mg Oral Daily Mason Jim, Zahari Xiang E, MD   40 mg at 05/02/21 9480   potassium chloride (KLOR-CON) packet 40 mEq  40 mEq Oral Once Comer Locket, MD       thiamine tablet 100 mg  100 mg Oral Daily Mason Jim, Kehaulani Fruin E, MD   100 mg at 05/02/21 1655   traZODone (DESYREL) tablet 50 mg  50 mg Oral QHS PRN Maryagnes Amos, FNP   50 mg at 05/01/21 2106   venlafaxine XR (EFFEXOR-XR) 24 hr capsule 75 mg  75 mg  Oral Q breakfast Maryagnes Amos, FNP   75 mg at 05/02/21 0808    Lab Results:  Results for orders placed or performed during the hospital encounter of 04/28/21 (from the past 48 hour(s))  Basic metabolic panel     Status: Abnormal   Collection Time: 05/02/21  6:19 AM  Result Value Ref Range   Sodium 137 135 - 145 mmol/L   Potassium 4.3 3.5 - 5.1 mmol/L   Chloride 104 98 - 111 mmol/L   CO2 25 22 - 32 mmol/L   Glucose, Bld 102 (H) 70 - 99 mg/dL    Comment: Glucose reference range applies only to  samples taken after fasting for at least 8 hours.   BUN 19 6 - 20 mg/dL   Creatinine, Ser 7.34 0.61 - 1.24 mg/dL   Calcium 9.2 8.9 - 28.7 mg/dL   GFR, Estimated >68 >11 mL/min    Comment: (NOTE) Calculated using the CKD-EPI Creatinine Equation (2021)    Anion gap 8 5 - 15    Comment: Performed at [facility], [address] [place], [place] [address]    Blood Alcohol level:  Lab Results  Component Value Date   ETH 136 (H) 04/28/2021   ETH <10 03/19/2021    Metabolic Disorder Labs: Lab Results  Component Value Date   HGBA1C 5.4 04/29/2021   MPG 108.28 04/29/2021   MPG 105 12/03/2020   No results found for: PROLACTIN Lab Results  Component Value Date   CHOL 202 (H) 04/29/2021   TRIG 134 04/29/2021   HDL 63 04/29/2021   CHOLHDL 3.2 04/29/2021   VLDL 27 04/29/2021   LDLCALC 112 (H) 04/29/2021   LDLCALC 96 12/03/2020    Musculoskeletal: Strength & Muscle Tone: within normal limits Gait & Station: normal Patient leans: N/A Psychiatric Specialty Exam: Physical Exam Vitals and nursing note reviewed.  HENT:     Head: Normocephalic.  Pulmonary:     Effort: Pulmonary effort is normal.  Neurological:     General: No focal deficit present.     Mental Status: He is alert.    Review of Systems  Constitutional:  Negative for chills and diaphoresis.  Respiratory:  Negative for shortness of breath.   Cardiovascular:  Negative for chest pain.   Gastrointestinal:  Negative for diarrhea, nausea and vomiting.  Neurological:  Negative for headaches.   Blood pressure (!) 134/94, pulse 78, temperature 97.6 F (36.4 C), temperature source Oral, resp. rate 16, height 5\' 10"  (1.778 m), weight 72.6 kg, SpO2 100 %.Body mass index is 22.96 kg/m.  General Appearance:  casually dressed, well groomed  Eye Contact:  Good  Speech:  Clear and Coherent and Normal Rate  Volume:  Normal  Mood:  Optimistic, hopeful - appears euthymic  Affect:  moderate, stable, full  Thought Process:  Goal Directed and Linear  Orientation:  Full (Time, Place, and Person)  Thought Content:  Logical and denies AVH, paranoia or delusions  Suicidal Thoughts: Denied  Homicidal Thoughts: Denied  Memory:  Recent;   Good  Judgement:  Fair  Insight:  Fair  Psychomotor Activity:  Normal  Concentration:  Concentration: Good and Attention Span: Good  Recall:  Good  Fund of Knowledge:  Good  Language:  Good  Akathisia:  Negative  Assets:  Communication Skills Desire for Improvement Resilience  ADL's:  Intact  Cognition:  WNL   Treatment Plan Summary: Diagnoses / Active Problems: 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 features (r/o substance induced mood d/o, r/o bipolar II) Opiate use d/o Methamphetamine use - episodic (r/o stimulant use d/o) Cannabis use - episodic (r/o cannabis use d/o) R/o alcohol use d/o  PLAN: Safety and Monitoring:  -- Voluntary admission to inpatient psychiatric unit for safety, stabilization and treatment  -- Daily contact with patient to assess and evaluate symptoms and progress in treatment  -- Patient's case to be discussed in multi-disciplinary team meeting  -- Observation Level : q15 minute checks  -- Vital signs:  q12 hours, BP and pulse rate both elevated this morning (158/100 and 102 respectively) so will continue monitoring  -- Precautions: suicide, elopement, and assault  2. Psychiatric Diagnoses and  Treatment:   MDD  recurrent severe without psychotic features (r/o substance induced mood d/o, r/o bipolar II)  -- Continue Effexor XR 75mg  daily   -- patient refusing restart of Neurontin to help with potential cravings, mood, or anxiety and refusing restart of additional mood stabilizer at this time  -- Encouraged patient to participate in unit milieu and in scheduled group therapies   -- Short Term Goals: Ability to verbalize feelings will improve, Ability to demonstrate self-control will improve, and Ability to identify and develop effective coping behaviors will improve  -- Long Term Goals: Improvement in symptoms so as ready for discharge   Opiate use d/o Methamphetamine use - episodic (r/o stimulant use d/o) Cannabis use - episodic (r/o cannabis use d/o) R/o alcohol use d/o -- Continue COWS monitoring - Recent COWS score 2,1,0 -- Continue CIWA monitoring with Ativan 1mg  for scores >10; CIWA score 0 today -- Continue PRNs for symptomatic treatment of withdrawal sx (Robaxin, Naprosyn, Zofran, Vistaril, Tylenol, Bentyl, Imodium) -- Continue Thiamine and MVI oral replacement and Ensure for nutritional support -- Continue Protonix 40mg  daily for GI protection -- SW reports patient is accepted to [facility] for residential program Monday  -- Short Term Goals: Ability to identify triggers associated with substance abuse/mental health issues will improve  -- Long Term Goals: Improvement in symptoms so as ready for discharge   3. Medical Issues Being Addressed:   Hepatitis C  -- Will need outpatient f/u for treatment after discharge  -- AST 32 and ALT 36 and patient currently asymptomatic; discussed blood bourne precautions with patient   Hypokalemia - resolved  -- Patient did not receive K+ supplement - BMP yesterday morning shows K+ 4.0   Hyponatremia - resolved  -- BMP Na 134 -> 137 (10/28)   Hypercholesterolemia  -- Will need outpatient management after discharge   4. Discharge Planning:   -- Social  work and case management to assist with discharge planning and identification of hospital follow-up needs prior to discharge  -- Estimated LOS: 5-7 days  -- Discharge Concerns: Need to establish a safety plan; Medication compliance and effectiveness  -- Discharge Goals: Return home with outpatient referrals for mental health follow-up including medication management/psychotherapy  [facility], MD, FAPA 05/03/2021, 7:37 AM

## 2021-05-03 NOTE — [facility] Group Notes (Signed)
.  Psychoeducational Group Note    Date:05/03/2021 Time: 1300-1400    Purpose of Group: . The group focus' on teaching patients on how to identify their needs and how Life Skills:  A group where two lists are made. What people need and what are things that we do that are healthy. The lists are developed by the patients and it is explained that we often do the actions that are not healthy to get our list of needs met.  to develop the coping skills needed to get their needs met  Participation Level:  Active  Participation Quality:  Appropriate  Affect:  Appropriate  Cognitive:  Oriented  Insight:  Improving  Engagement in Group:  Engaged  Additional Comments:  Pt rates his energy as a 10. Atteneding the group but just sitting and not speaking much.  Jared Jordan

## 2021-05-03 NOTE — Progress Notes (Signed)
D: Patient is animated and pleasant with staff. He is talkative. Patient denies any thoughts of self harm. Patient states he is here for heroin detox. He denies any severe withdrawal symptoms and his COWS have been under 4. He is attending group and interacting with his peers.   A: Continue to monitor medication management and MD orders.  Safety checks completed every 15 minutes per protocol.  Offer support and encouragement as needed.  R: Patient is receptive to staff; his behavior is appropriate.     05/03/21 1100  Psych Admission Type (Psych Patients Only)  Admission Status Voluntary  Psychosocial Assessment  Patient Complaints None  Eye Contact Fair  Facial Expression Animated  Affect Appropriate to circumstance  Speech Logical/coherent  Interaction Assertive  Motor Activity Other (Comment) (wnl)  Appearance/Hygiene Unremarkable  Behavior Characteristics Cooperative  Mood Pleasant  Thought Process  Coherency WDL  Content WDL  Delusions None reported or observed  Perception WDL  Hallucination None reported or observed  Judgment Poor  Confusion None  Danger to Self  Current suicidal ideation? Denies  Danger to Others  Danger to Others None reported or observed

## 2021-05-03 NOTE — [facility] Group Notes (Signed)
Psychoeducational Group Note  Date: 05-03-21 Time:  1300  Group Topic/Focus:  Making Healthy Choices:   The focus of this group is to help patients identify negative/unhealthy choices they were using prior to admission and identify positive/healthier coping strategies to replace them upon discharge.In this group.   Participation Level:  Active  Participation Quality:  Appropriate  Affect:  Appropriate  Cognitive:  Oriented  Insight:  Improving  Engagement in Group:  Engaged  Additional Comments:.Attended and participated.  Dione Housekeeper

## 2021-05-03 NOTE — Group Note (Signed)
LCSW Group Therapy Note  Group Date: 05/03/2021 Start Time: 1005 End Time: 1105   Type of Therapy and Topic:  Group Therapy: Anger Cues and Responses  Participation Level:  Active   Description of Group:   In this group, patients learned how to recognize the physical, cognitive, emotional, and behavioral responses they have to anger-provoking situations.  They identified a recent time they became angry and how they reacted.  They analyzed how their reaction was possibly beneficial and how it was possibly unhelpful.  The group discussed a variety of healthier coping skills that could help with such a situation in the future.  Focus was placed on how helpful it is to recognize the underlying emotions to our anger, because working on those can lead to a more permanent solution as well as our ability to focus on the important rather than the urgent.  Therapeutic Goals: Patients will remember their last incident of anger and how they felt emotionally and physically, what their thoughts were at the time, and how they behaved. Patients will identify how their behavior at that time worked for them, as well as how it worked against them. Patients will explore possible new behaviors to use in future anger situations. Patients will learn that anger itself is normal and cannot be eliminated, and that healthier reactions can assist with resolving conflict rather than worsening situations.  Summary of Patient Progress:  Jared Jordan was active during the group. He shared a recent occurrence wherein feeling angry at himself led to him choosing to cope with drugs and alcohol, which he recognizes is a pattern he continues to follow.  He made several observations during group that were insightful for the whole group.  He shared that the biggest lie he was ever told was "It's not that simple."  He stated it is in fact simple to do the things that patients are told repeatedly will be helpful to them, although it may  not be easy. He demonstrated excellent insight into the subject matter, was respectful of peers, and participated throughout the entire session.  Therapeutic Modalities:   Cognitive Behavioral Therapy    Lynnell Chad, LCSWA 05/03/2021  11:17 AM

## 2021-05-04 MED ORDER — PANTOPRAZOLE SODIUM 40 MG PO TBEC: 40.0000 mg | DELAYED_RELEASE_TABLET | Freq: Every day | ORAL | 0 refills | Status: DC

## 2021-05-04 MED ORDER — VENLAFAXINE HCL ER 75 MG PO CP24: 75.0000 mg | ORAL_CAPSULE | Freq: Every day | ORAL | 0 refills | Status: DC

## 2021-05-04 MED ORDER — TRAZODONE HCL 50 MG PO TABS: 50.0000 mg | ORAL_TABLET | Freq: Every evening | ORAL | 0 refills | Status: DC | PRN

## 2021-05-04 NOTE — Progress Notes (Addendum)
   05/04/21 0700  Vital Signs  Temp 99 F (37.2 C)  Pulse Rate 78  BP 129/89  Patient Position (if appropriate) Sitting   D: Patient denies SI/HI/ AVH. Pt. Complained if pain in his ribs. Pt. Denies both anxiety and depression. Pt. Reported that he sleptp."Good"   A:  Patient took scheduled medicine.  Support and encouragement provided Routine safety checks conducted every 15 minutes. Patient  Informed to notify staff with any concerns.     R: Safety maintained.   Late note @1925   Manual bp 160/90 left arm sitting. Pt. Reported that his BP always runs high.

## 2021-05-04 NOTE — Progress Notes (Signed)
   05/03/21 2130  Psychosocial Assessment  Patient Complaints None  Eye Contact Fair  Facial Expression Animated  Affect Appropriate to circumstance  Speech Logical/coherent  Interaction Assertive  Motor Activity Other (Comment) (wnl)  Appearance/Hygiene Unremarkable  Behavior Characteristics Cooperative;Appropriate to situation  Mood Pleasant  Thought Process  Coherency WDL  Content WDL  Delusions None reported or observed  Perception WDL  Hallucination None reported or observed  Judgment Poor  Confusion None  Danger to Self  Current suicidal ideation? Denies  Self-Injurious Behavior No self-injurious ideation or behavior indicators observed or expressed   Agreement Not to Harm Self Yes  Description of Agreement verbal agreement to not harm self on the unit  Danger to Others  Danger to Others None reported or observed

## 2021-05-04 NOTE — Progress Notes (Signed)
Patient did not attend group this AM.

## 2021-05-04 NOTE — BHH Suicide Risk Assessment (Signed)
[facility] Discharge Suicide Risk Assessment   Principal Problem: MDD (major depressive disorder), recurrent severe, without psychosis ([place]) Discharge Diagnoses: Principal Problem:   MDD (major depressive disorder), recurrent severe, without psychosis ([place]) Active Problems:   Substance induced mood disorder ([place])   Opiate abuse, continuous ([place])   Hepatitis C  Total Time Spent in Direct Patient Care:  I personally spent 30 minutes on the unit in direct patient care. The direct patient care time included face-to-face time with the patient, reviewing the patient's chart, communicating with other professionals, and coordinating care. Greater than 50% of this time was spent in counseling or coordinating care with the patient regarding goals of hospitalization, psycho-education, and discharge planning needs.  Musculoskeletal: Strength & Muscle Tone: within normal limits Gait & Station: normal Patient leans: N/A Psychiatric Specialty Exam: Physical Exam Vitals and nursing note reviewed.  HENT:     Head: Normocephalic.  Pulmonary:     Effort: Pulmonary effort is normal.  Neurological:     General: No focal deficit present.     Mental Status: He is alert.     Review of Systems  Constitutional:  Negative for chills and diaphoresis.  Respiratory:  Negative for shortness of breath.   Cardiovascular:  Negative for chest pain.  Gastrointestinal:  Negative for diarrhea, nausea and vomiting.  Neurological:  Negative for headaches.   BP 133/108 HR 107, T97.8 sats 100% RA  General Appearance:  casually dressed, well groomed  Eye Contact:  Good  Speech:  Clear and Coherent and Normal Rate  Volume:  Normal  Mood: hopeful - appears euthymic  Affect:  moderate, stable, full  Thought Process:  Goal Directed and Linear  Orientation:  Full (Time, Place, and Person)  Thought Content:  Logical and denies AVH, paranoia or delusions  Suicidal Thoughts: Denied  Homicidal Thoughts: Denied  Memory:  Recent;    Good  Judgement:  Fair  Insight:  Fair  Psychomotor Activity:  Normal  Concentration:  Concentration: Good and Attention Span: Good  Recall:  Good  Fund of Knowledge:  Good  Language:  Good  Akathisia:  Negative  Assets:  Communication Skills Desire for Improvement Resilience  ADL's:  Intact  Cognition:  WNL    Mental Status Per Nursing Assessment::   On Admission:  Suicidal ideation indicated by patient - resolved  Demographic Factors:  Male, Living alone, and Unemployed, homeless  Loss Factors: Loss of significant relationship(one year anniversary of father's death)  Historical Factors: Prior suicide attempts and Impulsivity, substance use prior to admission, previous psychiatric diagnoses/treatment  Risk Reduction Factors:   Sense of responsibility to family, Positive social support, and Positive coping skills or problem solving skills  Continued Clinical Symptoms:  Depression:   Recent sense of peace/wellbeing Alcohol/Substance Abuse/Dependencies Previous Psychiatric Diagnoses and Treatments  Cognitive Features That Contribute To Risk:  None    Suicide Risk:  Mild:  There are no identifiable plans, no associated intent, mild dysphoria and related symptoms, good self-control (both objective and subjective assessment), few other risk factors, and identifiable protective factors, including available and accessible social support.   Follow-up [place] Follow up.   Specialty: Addiction Medicine Why: Referral made Contact information: [id number] [place] [address] [phone]         Services, [name] Follow up.   Why: Referral made Contact information: [address] [place] [address] [phone]         [facility]. Go on 05/20/2021.   Why: You have a hospital follow up appointment to establish care with a primary care provider on 05/20/21  at 2:15 pm.  This appointment will be held in person. Please consider Contact information: [address] [place] [address] [phone]                Plan Of Care/Follow-up recommendations:  Activity:  as tolerated Diet:  heart healthy Other:  Patient advised to go directly to [facility] for continued treatment and to comply with antidepressant. He was encouraged to follow up with [facility] for mental health outpatient care after completing rehab. He was encouraged to abstain from alcohol and illicit substance use after discharge. He was advised to keep scheduled appointment with [facility] without fail for management of his Hepatitis C. He was advised not to share needles, to practice blood bourne precautions, and to engage in safe sex practices given Hep C positive status. He has mildly elevated LDL cholesterol that also needs to be followed up with a primary care provider after discharge, and he was advised to have his blood pressure monitored while on Effexor.  Comer Locket, MD, FAPA 05/04/2021, 6:08 PM

## 2021-05-04 NOTE — Group Note (Signed)
LCSW Group Therapy Note   Group Date: 05/04/2021 Start Time: 1000 End Time: 1100   Type of Therapy and Topic:  Group Therapy: Boundaries  Participation Level:  Active  Description of Group: This group will address the use of boundaries in their personal lives. Patients will explore why boundaries are important, the difference between healthy and unhealthy boundaries, and negative and postive outcomes of different boundaries and will look at how boundaries can be crossed.  Patients will be encouraged to identify current boundaries in their own lives and identify what kind of boundary is being set. Facilitators will guide patients in utilizing problem-solving interventions to address and correct types boundaries being used and to address when no boundary is being used. Understanding and applying boundaries will be explored and addressed for obtaining and maintaining a balanced life. Patients will be encouraged to explore ways to assertively make their boundaries and needs known to significant others in their lives, using other group members and facilitator for role play, support, and feedback.  Therapeutic Goals:  1.  Patient will identify areas in their life where setting clear boundaries could be  used to improve their life.  2.  Patient will identify signs/triggers that a boundary is not being respected. 3.  Patient will identify two ways to set boundaries in order to achieve balance in  their lives: 4.  Patient will demonstrate ability to communicate their needs and set boundaries  through discussion and/or role plays  Summary of Patient Progress:  Jared Jordan was fully present/active throughout the session and proved open to feedback from CSW and peers. Patient demonstrated excellent insight into the subject matter, was respectful of peers, and was present  for almost the entire session, leaving about 10 minutes before the end of group.  His comments were on topic and helpful to  others.  Therapeutic Modalities:   Cognitive Behavioral Therapy Solution-Focused Therapy  Lynnell Chad, LCSWA 05/04/2021  3:05 PM

## 2021-05-04 NOTE — Progress Notes (Signed)
[facility] Physician Progress Note  05/04/2021 7:13 AM Jared Jordan  MRN:  [id number]  Chief Complaint: SI and addiction  Subjective: Jared Jordan is a 30 y.o. 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 The patient is currently on Hospital Day 6.   Chart Review from last 24 hours:  Chart Review, 24 hr Events: The patient's chart was reviewed and nursing notes were reviewed. The patient's case was discussed in multidisciplinary team meeting. Per nursing, he has had no acute safety concerns or behavioral issues noted. He attended groups. Per MAR, he was complaint with scheduled medications and only required Naprosyn PRN X1 for pain.   Information Obtained Today During Patient Interview: On interview today, the patient states he is feeling good today without signs of anxiety, depression or mood elevation. He denies SI, HI, AVH, paranoia or medication side-effects. He questions if he needs to remain on antidepressant and was advised that given recent SI that I would recommend remaining on medication. He denies current withdrawal or cravings and is looking forward to [facility] Treatment referral tomorrow. He voices no physical complaints and states he slept well without Trazodone. He reports good appetite.   Principal Problem: MDD (major depressive disorder), recurrent severe, without psychosis ([facility]) Diagnosis: Principal Problem:   MDD (major depressive disorder), recurrent severe, without psychosis ([facility]) Active Problems:   Substance induced mood disorder ([facility])   Opiate abuse, continuous ([facility])   Hepatitis C  Total Time Spent in Direct Patient Care:  I personally spent 20 minutes on the unit in direct patient care. The direct patient care time included face-to-face time with the patient,  reviewing the patient's chart, communicating with other professionals, and coordinating care. Greater than 50% of this time was spent in counseling or coordinating care with the patient regarding goals of hospitalization, psycho-education, and discharge planning needs.  Past Psychiatric History: see H&P  Past Medical History:  Past Medical History:  Diagnosis Date   Drug use     Past Surgical History:  Procedure Laterality Date   SHOULDER SURGERY     SHOULDER SURGERY Left 2020   Family History: see H&P  Family Psychiatric  History: see H&P  Social History:  Social History   Substance and Sexual Activity  Alcohol Use Yes   Alcohol/week: 4.0 - 6.0 standard drinks   Types: 4 - 6 Cans of beer per week   Comment: normally, 4-6 beers weekly; last drank 2 bottles of champagne yesterday     Social History   Substance and Sexual Activity  Drug Use Yes   Types: IV, Heroin, Marijuana, Other-see comments   Comment: using 1gm everyday heroin - for past 3 weeks    Social History   Socioeconomic History   Marital status: Single    Spouse name: Not on file   Number of children: Not on file   Years of education: Not on file   Highest education level: Not on file  Occupational History   Not on file  Tobacco Use   Smoking status: Every Day    Packs/day: 0.25    Years: 1.00    Pack years: 0.25    Types: Cigarettes   Smokeless tobacco: Never  Vaping Use   Vaping Use: Some days   Start date: 11/04/2018   Devices: Jewel Menthol  Substance and Sexual Activity  Alcohol use: Yes    Alcohol/week: 4.0 - 6.0 standard drinks    Types: 4 - 6 Cans of beer per week    Comment: normally, 4-6 beers weekly; last drank 2 bottles of champagne yesterday   Drug use: Yes    Types: IV, Heroin, Marijuana, Other-see comments    Comment: using 1gm everyday heroin - for past 3 weeks   Sexual activity: Not Currently  Other Topics Concern   Not on file  Social History Narrative   Pt is grieving the  death of his father last year. 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 Lives alone and counts his mom as social support. Has siblings but doesn't speak to them.   Social Determinants of Health   Financial Resource Strain: Not on file  Food Insecurity: Not on file  Transportation Needs: Not on file  Physical Activity: Not on file  Stress: Not on file  Social Connections: Not on file   Sleep: Good per patient report - 6.5 hours documented  Appetite:  Good  Current Medications: Current Facility-Administered Medications  Medication Dose Route Frequency Provider Last Rate Last Admin   acetaminophen (TYLENOL) tablet 500 mg  500 mg Oral Q6H PRN Mason Jim, Margherita Collyer E, MD       alum & mag hydroxide-simeth (MAALOX/MYLANTA) 200-200-20 MG/5ML suspension 30 mL  30 mL Oral Q4H PRN Starkes-Perry, Juel Burrow, FNP       dicyclomine (BENTYL) tablet 20 mg  20 mg Oral Q6H PRN Mason Jim, Yovani Cogburn E, MD       magnesium hydroxide (MILK OF MAGNESIA) suspension 30 mL  30 mL Oral Daily PRN Starkes-Perry, Juel Burrow, FNP       methocarbamol (ROBAXIN) tablet 500 mg  500 mg Oral Q8H PRN Mason Jim, Eriyonna Matsushita E, MD   500 mg at 05/01/21 2106   multivitamin with minerals tablet 1 tablet  1 tablet Oral Daily Bartholomew Crews E, MD   1 tablet at 05/03/21 0902   naproxen (NAPROSYN) tablet 500 mg  500 mg Oral BID PRN Comer Locket, MD   500 mg at 05/03/21 1310   pantoprazole (PROTONIX) EC tablet 40 mg  40 mg Oral Daily Mason Jim, Delesia Martinek E, MD   40 mg at 05/03/21 0902   potassium chloride (KLOR-CON) packet 40 mEq  40 mEq Oral Once Bartholomew Crews E, MD       thiamine tablet 100 mg  100 mg Oral Daily Mason Jim, Francile Woolford E, MD   100 mg at 05/03/21 0902   traZODone (DESYREL) tablet 50 mg  50 mg Oral QHS PRN Maryagnes Amos, FNP   50 mg at 05/01/21 2106   venlafaxine XR (EFFEXOR-XR) 24 hr capsule 75 mg  75 mg Oral Q breakfast Maryagnes Amos, FNP   75 mg at 05/03/21 3154    Lab Results:  No results found for this or any  previous visit (from the past 48 hour(s)).   Blood Alcohol level:  Lab Results  Component Value Date   ETH 136 (H) 04/28/2021   ETH <10 03/19/2021    Metabolic Disorder Labs: Lab Results  Component Value Date   HGBA1C 5.4 04/29/2021   MPG 108.28 04/29/2021   MPG 105 12/03/2020   No results found for: PROLACTIN Lab Results  Component Value Date   CHOL 202 (H) 04/29/2021   TRIG 134 04/29/2021   HDL 63 04/29/2021   CHOLHDL 3.2 04/29/2021   VLDL 27 04/29/2021   LDLCALC 112 (H) 04/29/2021   LDLCALC 96  12/03/2020    Musculoskeletal: Strength & Muscle Tone: within normal limits Gait & Station: normal Patient leans: N/A Psychiatric Specialty Exam: Physical Exam Vitals and nursing note reviewed.  HENT:     Head: Normocephalic.  Pulmonary:     Effort: Pulmonary effort is normal.  Neurological:     General: No focal deficit present.     Mental Status: He is alert.    Review of Systems  Constitutional:  Negative for chills and diaphoresis.  Respiratory:  Negative for shortness of breath.   Cardiovascular:  Negative for chest pain.  Gastrointestinal:  Negative for diarrhea, nausea and vomiting.  Neurological:  Negative for headaches.   Blood pressure (!) 141/90, pulse 79, temperature 98.2 F (36.8 C), temperature source Oral, resp. rate 16, height 5\' 10"  (1.778 m), weight 72.6 kg, SpO2 100 %.Body mass index is 22.96 kg/m.  General Appearance:  casually dressed, well groomed  Eye Contact:  Good  Speech:  Clear and Coherent and Normal Rate  Volume:  Normal  Mood: hopeful - appears euthymic  Affect:  moderate, stable, full  Thought Process:  Goal Directed and Linear  Orientation:  Full (Time, Place, and Person)  Thought Content:  Logical and denies AVH, paranoia or delusions  Suicidal Thoughts: Denied  Homicidal Thoughts: Denied  Memory:  Recent;   Good  Judgement:  Fair  Insight:  Fair  Psychomotor Activity:  Normal  Concentration:  Concentration: Good and  Attention Span: Good  Recall:  Good  Fund of Knowledge:  Good  Language:  Good  Akathisia:  Negative  Assets:  Communication Skills Desire for Improvement Resilience  ADL's:  Intact  Cognition:  WNL   Treatment Plan Summary: Diagnoses / Active Problems: 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 features (r/o substance induced mood d/o, r/o bipolar II) Opiate use d/o Methamphetamine use - episodic (r/o stimulant use d/o) Cannabis use - episodic (r/o cannabis use d/o) R/o alcohol use d/o  PLAN: Safety and Monitoring:  -- Voluntary admission to inpatient psychiatric unit for safety, stabilization and treatment  -- Daily contact with patient to assess and evaluate symptoms and progress in treatment  -- Patient's case to be discussed in multi-disciplinary team meeting  -- Observation Level : q15 minute checks  -- Vital signs:  q12 hours, BP and pulse rate both elevated this morning (158/100 and 102 respectively) so will continue monitoring  -- Precautions: suicide, elopement, and assault  2. Psychiatric Diagnoses and Treatment:   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 features (r/o substance induced mood d/o, r/o bipolar II)  -- Continue Effexor XR 75mg  daily   -- patient refusing restart of Neurontin to help with potential cravings, mood, or anxiety and refusing restart of additional mood stabilizer at this time  -- Encouraged patient to participate in unit milieu and in scheduled group therapies   -- Short Term Goals: Ability to verbalize feelings will improve, Ability to demonstrate self-control will improve, and Ability to identify and develop effective coping behaviors will improve  -- Long Term Goals: Improvement in symptoms so as ready for discharge   Opiate use d/o Methamphetamine use - episodic (r/o stimulant use d/o) Cannabis use - episodic (r/o cannabis use d/o) R/o alcohol use d/o -- Continue COWS monitoring - Recent COWS score 1,0,0 -- Continue CIWA monitoring with  Ativan 1mg  for scores >10; last CIWA score 0 -- Continue PRNs for symptomatic treatment of withdrawal sx (Robaxin, Naprosyn, Zofran, Vistaril, Tylenol, Bentyl, Imodium) -- Continue Thiamine and MVI oral replacement and Ensure for  nutritional support -- Continue Protonix 40mg  daily for GI protection -- SW reports patient is accepted to [facility] for residential program Monday  -- Short Term Goals: Ability to identify triggers associated with substance abuse/mental health issues will improve  -- Long Term Goals: Improvement in symptoms so as ready for discharge   3. Medical Issues Being Addressed:   Hepatitis C  -- Will need outpatient f/u for treatment after discharge  -- AST 32 and ALT 36 and patient currently asymptomatic; discussed blood bourne precautions with patient   Hypokalemia - resolved  -- Patient did not receive K+ supplement - BMP yesterday morning shows K+ 4.0   Hyponatremia - resolved  -- BMP Na 134 -> 137 (10/28)   Hypercholesterolemia  -- Will need outpatient management after discharge   4. Discharge Planning:   -- Social work and case management to assist with discharge planning and identification of hospital follow-up needs prior to discharge  -- Discharge to [facility] tomorrow morning.  -- Discharge Goals: Return home with outpatient referrals for mental health follow-up including medication management/psychotherapy  [facility], MD, FAPA 05/04/2021, 7:13 AM

## 2021-05-04 NOTE — BHH Group Notes (Signed)
participated and contributed to wrap up group 

## 2021-05-04 NOTE — BHH Group Notes (Signed)
BHH Group Notes:  (Nursing/MHT/Case Management/Adjunct)  Date:  05/04/2021  Time:  3:59 PM  Type of Therapy:  Music Therapy  Participation Level:  Active  Participation Quality:  Appropriate  Affect:  Appropriate  Cognitive:  Appropriate  Insight:  Appropriate  Engagement in Group:  Engaged  Modes of Intervention:  Activity  Summary of Progress/Problems:Patient was engaged in music therapy today as evidence by singing, and sad "he felt like he had accomplished something."  Reymundo Poll 05/04/2021, 3:59 PM

## 2021-05-05 ENCOUNTER — Encounter (HOSPITAL_COMMUNITY): Payer: Self-pay

## 2021-05-05 NOTE — Plan of Care (Signed)
  Problem: Education: Goal: Emotional status will improve Outcome: Progressing Goal: Mental status will improve Outcome: Progressing Goal: Verbalization of understanding the information provided will improve Outcome: Progressing   Problem: Activity: Goal: Interest or engagement in activities will improve Outcome: Progressing   Problem: Coping: Goal: Ability to verbalize frustrations and anger appropriately will improve Outcome: Progressing   Problem: Safety: Goal: Periods of time without injury will increase Outcome: Progressing   

## 2021-05-05 NOTE — Plan of Care (Signed)
Patient seen this morning and reports no SI, HI or AVH. He denies cravings or withdrawal symptoms. He is looking forward to transition to [facility] residential today. No physical complaints reported.  Bartholomew Crews, MD, Celene Skeen

## 2021-05-05 NOTE — Progress Notes (Signed)
Pt did not attend group. 

## 2021-05-05 NOTE — BH IP Treatment Plan (Signed)
Interdisciplinary Treatment and Diagnostic Plan Update  05/05/2021 Time of Session: 9:50am Jatavion Barada MRN: [id number]  Principal Diagnosis: MDD (major depressive disorder), recurrent severe, without psychosis ([place])  Secondary Diagnoses: Principal Problem:   MDD (major depressive disorder), recurrent severe, without psychosis ([place]) Active Problems:   Substance induced mood disorder ([place])   Opiate abuse, continuous ([place])   Hepatitis C   Current Medications:  Current Facility-Administered Medications  Medication Dose Route Frequency Provider Last Rate Last Admin   acetaminophen (TYLENOL) tablet 500 mg  500 mg Oral Q6H PRN Nelda Marseille, Amy E, MD   500 mg at 05/05/21 0759   alum & mag hydroxide-simeth (MAALOX/MYLANTA) 200-200-20 MG/5ML suspension 30 mL  30 mL Oral Q4H PRN Starkes-Perry, Gayland Curry, FNP       magnesium hydroxide (MILK OF MAGNESIA) suspension 30 mL  30 mL Oral Daily PRN Starkes-Perry, Gayland Curry, FNP       multivitamin with minerals tablet 1 tablet  1 tablet Oral Daily Nelda Marseille, Amy E, MD   1 tablet at 05/05/21 0758   pantoprazole (PROTONIX) EC tablet 40 mg  40 mg Oral Daily Nelda Marseille, Amy E, MD   40 mg at 05/05/21 0758   potassium chloride (KLOR-CON) packet 40 mEq  40 mEq Oral Once Harlow Asa, MD       thiamine tablet 100 mg  100 mg Oral Daily Nelda Marseille, Amy E, MD   100 mg at 05/05/21 0758   traZODone (DESYREL) tablet 50 mg  50 mg Oral QHS PRN Suella Broad, FNP   50 mg at 05/04/21 2109   venlafaxine XR (EFFEXOR-XR) 24 hr capsule 75 mg  75 mg Oral Q breakfast Suella Broad, FNP   75 mg at 05/05/21 [id number]   PTA Medications: Medications Prior to Admission  Medication Sig Dispense Refill Last Dose   [DISCONTINUED] traZODone (DESYREL) 50 MG tablet Take 1 tablet (50 mg total) by mouth at bedtime as needed for sleep. 30 tablet 1    [DISCONTINUED] venlafaxine XR (EFFEXOR-XR) 75 MG 24 hr capsule Take 1 capsule (75 mg total) by mouth daily with breakfast. 30  capsule 1     Patient Stressors: Educational concerns   Loss of father passed one year ago   Medication change or noncompliance   Substance abuse    Patient Strengths: Average or above average intelligence  Capable of independent living  Motivation for treatment/growth   Treatment Modalities: Medication Management, Group therapy, Case management,  1 to 1 session with clinician, Psychoeducation, Recreational therapy.   Physician Treatment Plan for Primary Diagnosis: MDD (major depressive disorder), recurrent severe, without psychosis ([place]) Long Term Goal(s): Improvement in symptoms so as ready for discharge   Short Term Goals: Ability to identify triggers associated with substance abuse/mental health issues will improve Ability to verbalize feelings will improve Ability to demonstrate self-control will improve Ability to identify and develop effective coping behaviors will improve  Medication Management: Evaluate patient's response, side effects, and tolerance of medication regimen.  Therapeutic Interventions: 1 to 1 sessions, Unit Group sessions and Medication administration.  Evaluation of Outcomes: Adequate for Discharge  Physician Treatment Plan for Secondary Diagnosis: Principal Problem:   MDD (major depressive disorder), recurrent severe, without psychosis ([place]) Active Problems:   Substance induced mood disorder ([place])   Opiate abuse, continuous ([place])   Hepatitis C  Long Term Goal(s): Improvement in symptoms so as ready for discharge   Short Term Goals: Ability to identify triggers associated with substance abuse/mental health issues will improve Ability to  verbalize feelings will improve Ability to demonstrate self-control will improve Ability to identify and develop effective coping behaviors will improve     Medication Management: Evaluate patient's response, side effects, and tolerance of medication regimen.  Therapeutic Interventions: 1 to 1 sessions, Unit Group  sessions and Medication administration.  Evaluation of Outcomes: Adequate for Discharge   RN Treatment Plan for Primary Diagnosis: MDD (major depressive disorder), recurrent severe, without psychosis ([facility]) Long Term Goal(s): Knowledge of disease and therapeutic regimen to maintain health will improve  Short Term Goals: Ability to remain free from injury will improve, Ability to verbalize frustration and anger appropriately will improve, and Ability to demonstrate self-control  Medication Management: RN will administer medications as ordered by provider, will assess and evaluate patient's response and provide education to patient for prescribed medication. RN will report any adverse and/or side effects to prescribing provider.  Therapeutic Interventions: 1 on 1 counseling sessions, Psychoeducation, Medication administration, Evaluate responses to treatment, Monitor vital signs and CBGs as ordered, Perform/monitor CIWA, COWS, AIMS and Fall Risk screenings as ordered, Perform wound care treatments as ordered.  Evaluation of Outcomes: Adequate for Discharge   LCSW Treatment Plan for Primary Diagnosis: MDD (major depressive disorder), recurrent severe, without psychosis ([facility]) Long Term Goal(s): Safe transition to appropriate next level of care at discharge, Engage patient in therapeutic group addressing interpersonal concerns.  Short Term Goals: Engage patient in aftercare planning with referrals and resources, Increase social support, Increase ability to appropriately verbalize feelings, and Increase skills for wellness and recovery  Therapeutic Interventions: Assess for all discharge needs, 1 to 1 time with Social worker, Explore available resources and support systems, Assess for adequacy in community support network, Educate family and significant other(s) on suicide prevention, Complete Psychosocial Assessment, Interpersonal group therapy.  Evaluation of Outcomes: Adequate for  Discharge   Progress in Treatment: Attending groups: Yes. Participating in groups: Yes. Taking medication as prescribed: Yes. Toleration medication: Yes. Family/Significant other contact made: No, will contact:  Declined consents  Patient understands diagnosis: Yes. Discussing patient identified problems/goals with staff: Yes. Medical problems stabilized or resolved: Yes. Denies suicidal/homicidal ideation: Yes. Issues/concerns per patient self-inventory: No.     New problem(s) identified: No, Describe:  None    New Short Term/Long Term Goal(s): medication stabilization, elimination of SI thoughts, development of comprehensive mental wellness plan.    Patient Goals: "To work on my attitude and remain positive"    Discharge Plan or Barriers: Patient discharged to [facility] services    Reason for Continuation of Hospitalization:  Medication stabilization    Estimated Length of Stay: 1 day     Scribe for Treatment Team: Otelia Santee, LCSW 05/05/2021 10:49 AM

## 2021-05-05 NOTE — Progress Notes (Signed)
Patient denies any SI, HI, AVH. Denies anxiety and depression.No complaints of pain voiced. Trazodone requested for sleep, patient excited about going home and wanted to get a good nights rest. Patient has been pleasant and cooperative. Socializing appropriately with peers.  Encouragement and support provided. Safety checks maintained every 15 minutes. Medications given as prescribed.   Pt receptive and remains safe on unit with q15 min checks.

## 2021-05-05 NOTE — Progress Notes (Signed)
D: Pt A & O X . Denies SI, HI, AVH and pain at this time. D/C to [facility] as ordered. Picked up in front of facility by [employer].  A: D/C instructions reviewed with pt including prescriptions, medication samples and follow up appointments; compliance encouraged. All belongings from assigned locker given to pt at time of departure. Scheduled medications given with verbal education and effects monitored. Safety checks maintained without incident till time of d/c.  R: Pt receptive to care. Compliant with medications when offered. Denies adverse drug reactions when assessed. Verbalized understanding related to d/c instructions. Signed belonging sheet in agreement with items received from locker. Ambulatory with a steady gait. Appears to be in no physical distress at time of departure.

## 2021-05-05 NOTE — Discharge Summary (Addendum)
Physician Discharge Summary Note  Patient:  Jared Jordan is an 30 y.o., male MRN:  [id number] DOB:  [birth year] Patient phone:  [phone] (home)  Patient address:   [address] [place] [address],  Total Time spent with patient: 1 hour  Date of Admission:  04/28/2021 Date of Discharge: 05/05/2021  Reason for Admission:  Jared Jordan is a 30 y.o. 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  Per H&P Patient states he is at [facility] because of severe depression as well as active suicidal ideation with plan to jump off [facility] bridge.  Patient states stressors include daily IV heroin use, father's death in Aug 13, 2018, and disputes with mother.  Patient states that he has been "getting comfortable" with how he is living with being homeless and "getting high".  Patient states that he has been experiencing depression symptoms including poor concentration and energy, appetite disturbances, poor sleep, guilt over substance abuse, anhedonia, low interest.  Patient states that all he needs to do is "rest and eat" and he will no longer feel depressed and suicidal.  Patient requesting substance rehab residential treatment.  Patient repeatedly states he is "trying to get help" and that whenever he tries "something small would happen and get in my way".  Discussed with patient his need to identify specific coping skills and ways to ensure that this admission will be different as patient has had multiple hospitalizations and multiple overdoses.  Patient endorses passive SI but is able to contract for safety while on the unit.  Patient denies present HI/AVH.   Patient states that he regularly "shoots heroin approximately $20 a day all summer".  When patient is unable to shoot heroin,  patient will "shoot methamphetamine".  Patient states that he regularly drinks alcohol and he had arrived at the ED intoxicated.  Patient endorses that he regularly experience withdrawal symptoms including sweating, anxiety, nausea, and agitation.  Patient states he is experiencing those symptoms right now.  Patient smokes a few cigarettes per day but does not want nicotine patch while hospitalized.  Patient states he also occasionally smokes THC.   Patient is presently homeless but will occasionally live with different friends.  Patient states that he is currently living in a "embarrassing place".  Patient has no income and he is living "day-to-day".    Patient does not appear to have significant manic symptoms.  Patient mentions that he occasionally has days where he is energetic and possible elevated mood but these are within the context of heroin use.  Patient also states that he has not had significant manic symptoms while on antidepressant treatment.   Patient does not endorse delusions, hallucinations, ideas of reference, first rank symptoms.   Patient denies flashbacks/dissociations, nightmares, trauma history, hypervigilance, avoidance, amnesia.   Patient denies significant medical history beyond shoulder bone spur and was informed of hepatitis C that was positive last [facility] admission in June.  Principal Problem: MDD (major depressive disorder), recurrent severe, without psychosis ([place]) Discharge Diagnoses: Principal Problem:   MDD (major depressive disorder), recurrent severe, without psychosis ([place]) Active Problems:   Substance induced mood disorder ([place])   Opiate abuse, continuous ([place])   Hepatitis C   Past Psychiatric History: see H&P  Past Medical History:  Past Medical History:  Diagnosis Date   Drug use  Past Surgical History:  Procedure Laterality Date   SHOULDER SURGERY     SHOULDER SURGERY Left 2020   Family History: History reviewed. No pertinent family  history. Family Psychiatric  History: see H&P Social History:  Social History   Substance and Sexual Activity  Alcohol Use Yes   Alcohol/week: 4.0 - 6.0 standard drinks   Types: 4 - 6 Cans of beer per week   Comment: normally, 4-6 beers weekly; last drank 2 bottles of champagne yesterday     Social History   Substance and Sexual Activity  Drug Use Yes   Types: IV, Heroin, Marijuana, Other-see comments   Comment: using 1gm everyday heroin - for past 3 weeks    Social History   Socioeconomic History   Marital status: Single    Spouse name: Not on file   Number of children: Not on file   Years of education: Not on file   Highest education level: Not on file  Occupational History   Not on file  Tobacco Use   Smoking status: Every Day    Packs/day: 0.25    Years: 1.00    Pack years: 0.25    Types: Cigarettes   Smokeless tobacco: Never  Vaping Use   Vaping Use: Some days   Start date: 11/04/2018   Devices: Jewel Menthol  Substance and Sexual Activity   Alcohol use: Yes    Alcohol/week: 4.0 - 6.0 standard drinks    Types: 4 - 6 Cans of beer per week    Comment: normally, 4-6 beers weekly; last drank 2 bottles of champagne yesterday   Drug use: Yes    Types: IV, Heroin, Marijuana, Other-see comments    Comment: using 1gm everyday heroin - for past 3 weeks   Sexual activity: Not Currently  Other Topics Concern   Not on file  Social History Narrative   Pt is grieving the death of his father last year. 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 Lives alone and counts his mom as social support. Has siblings but doesn't speak to them.   Social Determinants of Health   Financial Resource Strain: Not on file  Food Insecurity: Not on file  Transportation Needs: Not on file  Physical Activity: Not on file  Stress: Not on file  Social Connections: Not on file    Hospital Course:   After the above admission evaluation, patient's presenting symptoms were noted.  Patient was recommended for antidepressant treatment. The medication regimen targeting those presenting symptoms were discussed with patient & initiated with patient's consent. Patient was continued on effexor-XR 75 mg for depression symptoms. Patient was started on Trazodone 50 mg prn qhs for sleep. Patient was on COWS protocol for opioid withdrawal.  Patient had no complaints throughout the hospitalization and felt that the medication regiment was appropriate.  Pertinent labs drawn during hospitalizations include: wnl bmp, cholesterol 202, LDL cholesterol 112, UDS positive for amphetamines, TSH 0.392, COVID PCR Negative, Hgb 12.6. He was made aware that his previous Hep C viral load was positive and that this means he has an active hepatitis C infection. He was set up with PCP appointment at time of discharge for management of Hep C and was counseled on safe sex practices and need for blood bourne precautions.   During the course of patient's hospitalization, the 15-minute checks were adequate to ensure patient's safety. Patient did not exhibit erratic or aggressive behavior and was compliant with scheduled medication. Patient was recommended for outpatient  substance rehab residential.  At the time of discharge patient is not reporting any acute suicidal/homicidal ideations/AVH, delusional thoughts or paranoia. Patient did not appear to be responding to any internal stimuli. Patient feels more confident about self-care & in managing their mental health problems. Patient currently denies any new issues or concerns. Education and supportive counseling provided throughout patient's hospital stay & upon discharge.   Today upon discharge evaluation with the attending psychiatrist Dr. Mason Jim, patient's mood is "good". Patient denies any specific concerns. Patient slept well, appetite good, regular bowel movements. Patient denies any physical complaints. Patient feels that the medications have been helpful &  is in agreement to continue current treatment regimen as recommended. Patient was able to engage in safety planning including plan to return to [facility] or contact emergency services if patient feels unable to maintain their own safety or the safety of others. Patient had no further questions, comments, or concerns. Patient left [facility] with all personal belongings in no apparent distress. Transportation per safe transport to [facility] was arranged for patient.  Physical Findings: CIWA:  CIWA-Ar Total: 0 COWS:  COWS Total Score: 0  Musculoskeletal: Strength & Muscle Tone: within normal limits Gait & Station: normal Patient leans: N/A   Psychiatric Specialty Exam:  Presentation  General Appearance: Appropriate for Environment; Casual  Eye Contact:Good  Speech:Clear and Coherent; Normal Rate  Speech Volume:Normal  Handedness:Right   Mood and Affect  Mood:Euthymic  Affect:Appropriate; Congruent   Thought Process  Thought Processes:Coherent; Goal Directed; Linear  Descriptions of Associations:Intact  Orientation:Full (Time, Place and Person)  Thought Content:Logical  History of Schizophrenia/Schizoaffective disorder:No  Duration of Psychotic Symptoms:No data recorded Hallucinations:Hallucinations: None  Ideas of Reference:None  Suicidal Thoughts:Suicidal Thoughts: No  Homicidal Thoughts:Homicidal Thoughts: No   Sensorium  Memory:Immediate Good; Recent Good; Remote Good  Judgment:Fair  Insight:Fair   Executive Functions  Concentration:Good  Attention Span:Good  Recall:Good  Fund of Knowledge:Good  Language:Good   Psychomotor Activity  Psychomotor Activity:Psychomotor Activity: Normal   Assets  Assets:Communication Skills; Desire for Improvement; Leisure Time; Social Support   Sleep  Sleep:Sleep: Good Number of Hours of Sleep: 5.75    Physical Exam: Physical Exam Vitals and nursing note reviewed.  Constitutional:      Appearance: Normal  appearance. He is normal weight.  HENT:     Head: Normocephalic and atraumatic.  Pulmonary:     Effort: Pulmonary effort is normal.  Neurological:     General: No focal deficit present.     Mental Status: He is oriented to person, place, and time.   Review of Systems  Respiratory:  Negative for shortness of breath.   Cardiovascular:  Negative for chest pain.  Gastrointestinal:  Negative for abdominal pain, constipation, diarrhea, heartburn, nausea and vomiting.  Neurological:  Negative for headaches.  Blood pressure (!) 134/97, pulse 94, temperature 97.6 F (36.4 C), temperature source Oral, resp. rate 16, height 5\' 10"  (1.778 m), weight 72.6 kg, SpO2 98 %. Body mass index is 22.96 kg/m.   Social History   Tobacco Use  Smoking Status Every Day   Packs/day: 0.25   Years: 1.00   Pack years: 0.25   Types: Cigarettes  Smokeless Tobacco Never   Tobacco Cessation:  N/A, patient does not currently use tobacco products   Blood Alcohol level:  Lab Results  Component Value Date   ETH 136 (H) 04/28/2021   ETH <10 03/19/2021    Metabolic Disorder Labs:  Lab Results  Component Value Date   HGBA1C 5.4 04/29/2021  MPG 108.28 04/29/2021   MPG 105 12/03/2020   No results found for: PROLACTIN Lab Results  Component Value Date   CHOL 202 (H) 04/29/2021   TRIG 134 04/29/2021   HDL 63 04/29/2021   CHOLHDL 3.2 04/29/2021   VLDL 27 04/29/2021   LDLCALC 112 (H) 04/29/2021   [place] 96 12/03/2020    See Psychiatric Specialty Exam and Suicide Risk Assessment completed by Attending Physician prior to discharge.  Discharge destination:  Home  Is patient on multiple antipsychotic therapies at discharge:  No   Has Patient had three or more failed trials of antipsychotic monotherapy by history:  No  Recommended Plan for Multiple Antipsychotic Therapies: NA   Allergies as of 05/05/2021   No Known Allergies      Medication List     TAKE these medications      Indication   pantoprazole 40 MG tablet Commonly known as: PROTONIX Take 1 tablet (40 mg total) by mouth daily.  Indication: Heartburn   traZODone 50 MG tablet Commonly known as: DESYREL Take 1 tablet (50 mg total) by mouth at bedtime as needed for sleep.  Indication: Trouble Sleeping   venlafaxine XR 75 MG 24 hr capsule Commonly known as: EFFEXOR-XR Take 1 capsule (75 mg total) by mouth daily with breakfast.  Indication: Major Depressive Disorder        Follow-up [facility] Follow up.   Specialty: Addiction Medicine Why: Referral made Contact information: [id number] [place] [address] [phone]         Services, [name] Follow up.   Why: Referral made Contact information: [address] [place] [address] [phone]         [facility]. Go on 05/20/2021.   Why: You have a hospital follow up appointment to establish care with a primary care provider on 05/20/21 at 2:15 pm.  This appointment will be held in person. Please consider Contact information: [address] [place] [phone]                 Follow-up recommendations:   Activity:  as tolerated Diet:  heart healthy   Comments:  Prescriptions were given at discharge.  Patient is agreeable with the discharge plan.  Patient was given an opportunity to ask questions.  Patient appears to feel comfortable with discharge and denies any current suicidal or homicidal thoughts.    Patient is instructed prior to discharge to: Take all medications as prescribed by mental healthcare provider. Report any adverse effects and or reactions from the medicines to outpatient provider promptly. In the event of worsening symptoms, patient is instructed to call the crisis hotline, 911 and or go to the nearest ED for appropriate evaluation and treatment of symptoms. Patient is to  follow-up with primary care provider for other medical issues, concerns and or health care needs.   Signed: France Ravens, MD 05/05/2021, 8:51 AM

## 2021-05-20 ENCOUNTER — Other Ambulatory Visit: Payer: Self-pay

## 2021-05-20 ENCOUNTER — Encounter: Payer: Self-pay | Admitting: Family Medicine

## 2021-05-20 ENCOUNTER — Ambulatory Visit: Payer: Self-pay | Attending: Family Medicine | Admitting: Family Medicine

## 2021-05-20 VITALS — BP 128/85 | HR 71 | Ht 70.0 in | Wt 176.8 lb

## 2021-05-20 DIAGNOSIS — B182 Chronic viral hepatitis C: Secondary | ICD-10-CM

## 2021-05-20 NOTE — Progress Notes (Signed)
Subjective:  Patient ID: Jared Jordan, male    DOB: Dec 11, 1990  Age: 30 y.o. MRN: [id number]  CC: Follow-up   HPI Jared Jordan is a 30 y.o. year old male who presents today to establish care. 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 During his course of hospitalization he was placed on trazodone and Wellbutrin which he was discharged on with a 30-day supply. He was referred to addiction recovery [facility] in [place]  Interval History: He is yet to establish with Behavioral Health and he never followed up with the addiction recovery care associates. He  states he is good and does not feel like he needs to take the medications anymore.  States he has quit IVDU. Quit smoking 1 month ago.  He informs me he presents today to discuss his positive hepatitis C screening which was discovered during his hospitalization. He has no additional concerns today. Past Medical History:  Diagnosis Date   Drug use     Past Surgical History:  Procedure Laterality Date   SHOULDER SURGERY     SHOULDER SURGERY Left 2020    History reviewed. No pertinent family history.  No Known Allergies  Outpatient Medications Prior to Visit  Medication Sig Dispense Refill   pantoprazole (PROTONIX) 40 MG tablet Take 1 tablet (40 mg total) by mouth daily. 30 tablet 0   traZODone (DESYREL) 50 MG tablet Take 1 tablet (50 mg total) by mouth at bedtime as needed for sleep. 30 tablet 0   venlafaxine XR (EFFEXOR-XR) 75 MG 24 hr capsule Take 1 capsule (75 mg total) by mouth daily with breakfast. 30 capsule 0   No facility-administered medications prior to visit.     ROS Review of Systems  Constitutional:  Negative for activity change and appetite change.  HENT:  Negative for sinus pressure and sore throat.   Eyes:  Negative for visual disturbance.  Respiratory:  Negative for  cough, chest tightness and shortness of breath.   Cardiovascular:  Negative for chest pain and leg swelling.  Gastrointestinal:  Negative for abdominal distention, abdominal pain, constipation and diarrhea.  Endocrine: Negative.   Genitourinary:  Negative for dysuria.  Musculoskeletal:  Negative for joint swelling and myalgias.  Skin:  Negative for rash.  Allergic/Immunologic: Negative.   Neurological:  Negative for weakness, light-headedness and numbness.  Psychiatric/Behavioral:  Negative for dysphoric mood and suicidal ideas.    Objective:  BP 128/85 (BP Location: Right Arm, Patient Position: Sitting, Cuff Size: Normal)   Pulse 71   Ht 5\' 10"  (1.778 m)   Wt 176 lb 12.8 oz (80.2 kg)   SpO2 98%   BMI 25.37 kg/m   BP/Weight 05/20/2021 05/05/2021 [id number]  Systolic BP 0000000 0000000 -  Diastolic BP 85 90 -  Wt. (Lbs) 176.8 - 160  BMI 25.37 - 22.96      Physical Exam Constitutional:      Appearance: He is well-developed.  Cardiovascular:     Rate and Rhythm: Normal rate.     Heart sounds: Normal heart sounds. No murmur heard. Pulmonary:     Effort: Pulmonary effort is normal.     Breath sounds: Normal breath sounds. No wheezing or rales.  Chest:     Chest wall: No tenderness.  Abdominal:     General: Bowel sounds are normal. There is no distension.     Palpations: Abdomen is soft. There is no mass.  Tenderness: There is no abdominal tenderness.  Musculoskeletal:        General: Normal range of motion.     Right lower leg: No edema.     Left lower leg: No edema.  Neurological:     Mental Status: He is alert and oriented to person, place, and time.  Psychiatric:        Mood and Affect: Mood normal.    CMP Latest Ref Rng & Units 05/02/2021 04/30/2021 04/28/2021  Glucose 70 - 99 mg/dL 177(L) 390(Z) 009(Q)  BUN 6 - 20 mg/dL 19 16 13   Creatinine 0.61 - 1.24 mg/dL 3.30 0.76  Sodium 135 - 145 mmol/L 137 134(L) 136  Potassium 3.5 - 5.1 mmol/L 4.3 4.0 3.4(L)   Chloride 98 - 111 mmol/L 104 102 106  CO2 22 - 32 mmol/L 25 23 22   Calcium 8.9 - 10.3 mg/dL 9.2 9.0 9.0  Total Protein 6.5 - 8.1 g/dL - - 7.4  Total Bilirubin 0.3 - 1.2 mg/dL - - 0.3  Alkaline Phos 38 - 126 U/L - - 81  AST 15 - 41 U/L - - 32  ALT 0 - 44 U/L - - 36    Lipid Panel     Component Value Date/Time   CHOL 202 (H) 04/29/2021 0613   TRIG 134 04/29/2021 0613   HDL 63 04/29/2021 0613   CHOLHDL 3.2 04/29/2021 0613   VLDL 27 04/29/2021 0613   LDLCALC 112 (H) 04/29/2021 0613    CBC    Component Value Date/Time   WBC 8.2 04/28/2021 0134   RBC 4.35 04/28/2021 0134   HGB 12.6 (L) 04/28/2021 0134   HCT 38.3 (L) 04/28/2021 0134   PLT 329 04/28/2021 0134   MCV 88.0 04/28/2021 0134   MCH 29.0 04/28/2021 0134   MCHC 32.9 04/28/2021 0134   RDW 13.8 04/28/2021 0134   LYMPHSABS 1.5 03/14/2021 0607   MONOABS 0.6 03/14/2021 0607   EOSABS 0.1 03/14/2021 0607   BASOSABS 0.0 03/14/2021 05/14/2021    Lab Results  Component Value Date   HGBA1C 5.4 04/29/2021    Assessment & Plan:  1. Chronic hepatitis C without hepatic coma (HCC) He is asymptomatic Risk factor for infection includes history of IVDU Will refer to infectious disease for treatment - Ambulatory referral to Infectious Disease    No orders of the defined types were placed in this encounter.   Follow-up: Return in about 1 year (around 05/20/2022) for Complete physical exam.       05/01/2021, MD, FAAFP. [facility] [place], [facility] [place]   05/20/2021, 5:29 PM

## 2021-05-20 NOTE — Patient Instructions (Signed)
Hepatitis C °Hepatitis C is a liver infection that is caused by the hepatitis C virus (HCV). The virus infects and causes inflammation in the liver. Hepatitis C can lead to: °Loss of liver function (liver failure). °Scarring of the liver (cirrhosis). °Liver cancer. °People with hepatitis C often do not know for months or years that they have this condition. This is because they have no symptoms or may have only mild symptoms. °What are the causes? °This condition is caused by HCV. The virus can spread from person to person (is contagious). It can spread through: °Contact with an infected person's blood, semen, or vaginal fluids. °Childbirth. A woman who has hepatitis C can pass it to her baby during birth. °Having received donated blood (blood transfusion) or an organ transplant that was done in the [place] before 1992. °What increases the risk? °The following factors may make you more likely to develop this condition: °Having contact with needles or syringes that have HCV on them (are contaminated). This may happen while injecting drugs, getting a tattoo or body piercing, or receiving acupuncture. Acupuncture is a treatment that inserts thin needles through your skin. Contact also may happen when you: °Have sex with someone who is infected. The virus can spread through vaginal, oral, or anal sex. °Receive treatment to filter your blood (kidney dialysis). °Have a job that involves contact with blood or body fluids, such as in health care. °Having HIV (human immunodeficiency virus) or AIDS (acquired immunodeficiency syndrome). °What are the signs or symptoms? °Symptoms of this condition include: °Tiredness (fatigue). °Loss of appetite. °Nausea or vomiting. °Pain in your abdomen. °Dark yellow urine. °Yellowing of your skin or the white parts of your eyes (jaundice). °Itchy skin. °Light-colored or tan stool. °Joint pain. °Bleeding and bruising that happen often. °Fluid buildup in your stomach (ascites). °Often,  hepatitis C causes no symptoms. °How is this diagnosed? °This condition is diagnosed with: °Blood tests. °Other tests that show how well your liver is working. These tests may include: °Magnetic resonance elastography (MRE). This imaging test uses MRI and sound waves to measure liver stiffness. °Transient elastography. This imaging test uses ultrasound to measure liver stiffness. °Liver biopsy. In this test, a tissue sample is taken from your liver and looked at under a microscope. °How is this treated? °Treatment may depend on how severe your condition is, how long it has lasted, and whether you have liver damage. Treatment may include: °Taking antiviral medicines and other medicines. °Having follow-up treatments every 6-12 months for infections or other liver problems. °Having a liver transplant. °Follow these instructions at home: °Medicines °Take over-the-counter and prescription medicines only as told by your health care provider. °If you were prescribed an antiviral medicine, take it as told by your health care provider. Do not stop using the antiviral even if you start to feel better. °Do not take any new medicines, including over-the-counter medicines or supplements, unless your health care provider approves. °Activity °Rest as needed. °Do not have sex unless your health care provider approves. °Avoid swimming or using hot tubs if you have open sores or wounds. °Return to your normal activities as told by your health care provider. Ask your health care provider what activities are safe for you. °Ask your health care provider when you may return to school or work. °Eating and drinking ° °Eat a balanced diet with plenty of fruits and vegetables, whole grains, and lean meats or non-meat proteins, such as beans or tofu. °Drink enough fluid to keep your   urine pale yellow. °Do not drink alcohol. °General instructions °Do not share toothbrushes, nail clippers, or razors. °Wash your hands often with soap and water  for at least 20 seconds. If soap and water are not available, use alcohol-based hand sanitizer. °Cover any cuts or open sores on your skin to prevent spreading HCV. °Keep all follow-up visits. This is important. You may need follow-up visits every 6-12 months. °How is this prevented? °There is no vaccine for hepatitis C. You can lessen your risk of coming into contact with HCV by making sure you: °Wash your hands often with soap and water for at least 20 seconds. °Do not share needles or syringes. °Use a condom every time you have vaginal, oral, or anal sex. Latex condoms offer the best protection. °Avoid handling blood or other body fluids without gloves or other protection. °Avoid getting tattoos or body piercings in shops that are not clean. °Where to find more information °Centers for Disease Control and Prevention: www.cdc.gov/hepatitis °World Health Organization: www.who.int °Contact a health care provider if you: °Have a fever or chills. °Have pain or swelling in your abdomen. °Pass dark urine. °Pass light-colored or tan stool. °Have joint pain. °Get help right away if you: °Have more fatigue. °Lose your appetite. °Cannot eat or drink without vomiting. °Develop jaundice, or your jaundice gets worse. °Bruise or bleed easily. °Summary °Hepatitis C is a liver infection that is caused by the hepatitis C virus (HCV). °This infection can lead to a loss of liver function (liver failure), scarring of the liver (cirrhosis), or liver cancer. °HCV can spread from person to person (is contagious). °Do not take any medicines, including over-the-counter medicines or supplements, unless your health care provider approves. °This information is not intended to replace advice given to you by your health care provider. Make sure you discuss any questions you have with your health care provider. °Document Revised: 05/09/2020 Document Reviewed: 05/09/2020 °Elsevier Patient Education © 2022 [employer] Inc[employer] ° °

## 2021-06-04 ENCOUNTER — Encounter: Payer: Self-pay | Admitting: Physician Assistant

## 2021-06-04 ENCOUNTER — Other Ambulatory Visit: Payer: Self-pay

## 2021-06-04 ENCOUNTER — Ambulatory Visit: Payer: Self-pay | Attending: Physician Assistant | Admitting: Physician Assistant

## 2021-06-04 VITALS — BP 139/80 | HR 80 | Temp 98.5°F | Resp 18 | Ht 70.0 in | Wt 181.0 lb

## 2021-06-04 DIAGNOSIS — F1010 Alcohol abuse, uncomplicated: Secondary | ICD-10-CM

## 2021-06-04 DIAGNOSIS — K219 Gastro-esophageal reflux disease without esophagitis: Secondary | ICD-10-CM

## 2021-06-04 DIAGNOSIS — F332 Major depressive disorder, recurrent severe without psychotic features: Secondary | ICD-10-CM

## 2021-06-04 DIAGNOSIS — Z6825 Body mass index (BMI) 25.0-25.9, adult: Secondary | ICD-10-CM

## 2021-06-04 DIAGNOSIS — F5104 Psychophysiologic insomnia: Secondary | ICD-10-CM

## 2021-06-04 DIAGNOSIS — F1110 Opioid abuse, uncomplicated: Secondary | ICD-10-CM

## 2021-06-04 DIAGNOSIS — B182 Chronic viral hepatitis C: Secondary | ICD-10-CM

## 2021-06-04 MED ORDER — TRAZODONE HCL 50 MG PO TABS: 50.0000 mg | ORAL_TABLET | Freq: Every evening | ORAL | 1 refills | Status: DC | PRN

## 2021-06-04 NOTE — Progress Notes (Signed)
Patient has eaten today. Patient has not taken medication today and would like to discuss D/C anxiety medication. Patient denies pain at this time.

## 2021-06-04 NOTE — Progress Notes (Signed)
Established Patient Office Visit  Subjective:  Patient ID: Jared Jordan, male    DOB: 08/18/1990  Age: 30 y.o. MRN: [id number]  CC:  Chief Complaint  Patient presents with   Medication Management    Anxiety medications    HPI Jared Jordan reports that he is currently residing at [facility] residential treatment center for substance abuse treatment.  States that he arrived on 05/05/21 States that he plans on going to long term care but unsure of his destination.  States that he should be transitioning in 1 to 2 weeks.  States that he does not feel he needs to take the Effexor any longer. States that he has taken the Effexor on and off for the past 6-7 months  and consistent for the last month, but then endorses that he stopped taking it 10days ago on his own.  States that he feels his moods are stable, denies any adverse effects from discontinuing the medication.  States that he does take the trazodone at bedtime with relief of insomnia.    States that he has been taking Protonix on a daily basis but would like to change that to as needed, states that he feels his lifestyle modifications have been helping.      Past Medical History:  Diagnosis Date   Anxiety    Depression    Drug use     Past Surgical History:  Procedure Laterality Date   SHOULDER SURGERY     SHOULDER SURGERY Left 2020    History reviewed. No pertinent family history.  Social History   Socioeconomic History   Marital status: Single    Spouse name: Not on file   Number of children: Not on file   Years of education: Not on file   Highest education level: Not on file  Occupational History   Not on file  Tobacco Use   Smoking status: Former    Packs/day: 0.25    Years: 1.00    Pack years: 0.25    Types: Cigarettes   Smokeless tobacco: Former    Quit date: 04/15/2021  Vaping Use   Vaping Use: Some days   Start date: 11/04/2018   Devices: Jewel Menthol  Substance and Sexual Activity    Alcohol use: Not Currently    Alcohol/week: 4.0 - 6.0 standard drinks    Types: 4 - 6 Cans of beer per week    Comment: normally, 4-6 beers weekly; last drank 2 bottles of champagne yesterday   Drug use: Not Currently    Types: IV, Heroin, Marijuana, Other-see comments    Comment: using 1gm everyday heroin - for past 3 weeks   Sexual activity: Not Currently  Other Topics Concern   Not on file  Social History Narrative   Pt is grieving the death of his father last year. 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 Lives alone and counts his mom as social support. Has siblings but doesn't speak to them.   Social Determinants of Health   Financial Resource Strain: Not on file  Food Insecurity: Not on file  Transportation Needs: Not on file  Physical Activity: Not on file  Stress: Not on file  Social Connections: Not on file  Intimate Partner Violence: Not on file    Outpatient Medications Prior to Visit  Medication Sig Dispense Refill   pantoprazole (PROTONIX) 40 MG tablet Take 1 tablet (40 mg total) by mouth daily. (Patient not taking: Reported on 06/04/2021) 30 tablet 0  traZODone (DESYREL) 50 MG tablet Take 1 tablet (50 mg total) by mouth at bedtime as needed for sleep. (Patient not taking: Reported on 06/04/2021) 30 tablet 0   venlafaxine XR (EFFEXOR-XR) 75 MG 24 hr capsule Take 1 capsule (75 mg total) by mouth daily with breakfast. (Patient not taking: Reported on 06/04/2021) 30 capsule 0   No facility-administered medications prior to visit.    No Known Allergies  ROS Review of Systems  Constitutional: Negative.   HENT: Negative.    Eyes: Negative.   Respiratory:  Negative for shortness of breath.   Cardiovascular:  Negative for chest pain.  Gastrointestinal: Negative.   Endocrine: Negative.   Genitourinary: Negative.   Musculoskeletal: Negative.   Skin: Negative.   Allergic/Immunologic: Negative.   Neurological: Negative.   Hematological: Negative.    Psychiatric/Behavioral: Negative.       Objective:    Physical Exam Vitals and nursing note reviewed.  Constitutional:      Appearance: Normal appearance.  HENT:     Head: Normocephalic and atraumatic.     Right Ear: External ear normal.     Left Ear: External ear normal.     Nose: Nose normal.     Mouth/Throat:     Mouth: Mucous membranes are moist.     Pharynx: Oropharynx is clear.  Eyes:     Extraocular Movements: Extraocular movements intact.     Conjunctiva/sclera: Conjunctivae normal.     Pupils: Pupils are equal, round, and reactive to light.  Cardiovascular:     Rate and Rhythm: Normal rate and regular rhythm.     Pulses: Normal pulses.     Heart sounds: Normal heart sounds.  Pulmonary:     Effort: Pulmonary effort is normal.     Breath sounds: Normal breath sounds.  Musculoskeletal:        General: Normal range of motion.     Cervical back: Normal range of motion and neck supple.  Skin:    General: Skin is warm and dry.  Neurological:     General: No focal deficit present.     Mental Status: He is alert and oriented to person, place, and time.  Psychiatric:        Mood and Affect: Mood normal.        Behavior: Behavior normal.        Thought Content: Thought content normal.        Judgment: Judgment normal.    BP 139/80 (BP Location: Left Arm, Patient Position: Sitting, Cuff Size: Normal)   Pulse 80   Temp 98.5 F (36.9 C) (Oral)   Resp 18   Ht 5\' 10"  (1.778 m)   Wt 181 lb (82.1 kg)   SpO2 100%   BMI 25.97 kg/m  Wt Readings from Last 3 Encounters:  06/04/21 181 lb (82.1 kg)  05/20/21 176 lb 12.8 oz (80.2 kg)  04/28/21 160 lb (72.6 kg)     Health Maintenance Due  Topic Date Due   COVID-19 Vaccine (1) Never done   Pneumococcal Vaccine 93-75 Years old (1 - PCV) Never done   HIV Screening  Never done    There are no preventive care reminders to display for this patient.  Lab Results  Component Value Date   TSH 0.392 04/29/2021   Lab  Results  Component Value Date   WBC 8.2 04/28/2021   HGB 12.6 (L) 04/28/2021   HCT 38.3 (L) 04/28/2021   MCV 88.0 04/28/2021   PLT 329 04/28/2021  Lab Results  Component Value Date   NA 137 05/02/2021   K 4.3 05/02/2021   CO2 25 05/02/2021   GLUCOSE 102 (H) 05/02/2021   BUN 19 05/02/2021   CREATININE 0.65 05/02/2021   BILITOT 0.3 04/28/2021   ALKPHOS 81 04/28/2021   AST 32 04/28/2021   ALT 36 04/28/2021   PROT 7.4 04/28/2021   ALBUMIN 4.2 04/28/2021   CALCIUM 9.2 05/02/2021   ANIONGAP 8 05/02/2021   Lab Results  Component Value Date   CHOL 202 (H) 04/29/2021   Lab Results  Component Value Date   HDL 63 04/29/2021   Lab Results  Component Value Date   LDLCALC 112 (H) 04/29/2021   Lab Results  Component Value Date   TRIG 134 04/29/2021   Lab Results  Component Value Date   CHOLHDL 3.2 04/29/2021   Lab Results  Component Value Date   HGBA1C 5.4 04/29/2021      Assessment & Plan:   Problem List Items Addressed This Visit       Digestive   Hepatitis C   Relevant Orders   Ambulatory referral to Infectious Disease   Gastroesophageal reflux disease without esophagitis     Other   Major depressive disorder, recurrent severe without psychotic features (HCC) - Primary   Relevant Medications   traZODone (DESYREL) 50 MG tablet   Opiate abuse, continuous (HCC)   Psychophysiological insomnia   Relevant Medications   traZODone (DESYREL) 50 MG tablet   Alcohol abuse   Other Visit Diagnoses     BMI 25.0-25.9,adult           Meds ordered this encounter  Medications   traZODone (DESYREL) 50 MG tablet    Sig: Take 1 tablet (50 mg total) by mouth at bedtime as needed for sleep.    Dispense:  30 tablet    Refill:  1    Order Specific Question:   Supervising Provider    Answer:   WRIGHT, PATRICK E [[id number]]   1. Major depressive disorder, recurrent severe without psychotic features (HCC) Patient stopped taking medication 10 days ago on his own, states he  is stable, okay to discontinue medication at this time.  Red flags given for prompt reevaluation  2. Psychophysiological insomnia Continue current regimen - traZODone (DESYREL) 50 MG tablet; Take 1 tablet (50 mg total) by mouth at bedtime as needed for sleep.  Dispense: 30 tablet; Refill: 1  3. Opiate abuse, continuous (HCC) Currently in substance abuse treatment program  4. Alcohol abuse   5. Chronic hepatitis C without hepatic coma (HCC)  Was seen by Dr. Alvis Lemmings and referred for hepatitis C treatment, unfortunately referral was closed, unable to contact patient, will restart referral today 12/03/20  - Ambulatory referral to Infectious Disease  6. Gastroesophageal reflux disease without esophagitis Change Protonix to as needed continue lifestyle modifications  7. BMI 25.0-25.9,adult    I have reviewed the patient's medical history (PMH, PSH, Social History, Family History, Medications, and allergies) , and have been updated if relevant. I spent 30 minutes reviewing chart and  face to face time with patient.    Follow-up: Return if symptoms worsen or fail to improve.    Kasandra Knudsen Mayers, PA-C

## 2021-06-04 NOTE — Patient Instructions (Signed)
I have discontinued the Effexor per your request.  You will continue taking trazodone at bedtime as needed.  I have also discontinued the Protonix per your request, you may resume this medication as needed.  I restarted your referral to infectious disease, they will call [name] to set up your appointment.  Please feel free to follow-up with the mobile unit in 2 weeks if needed.  Roney Jaffe, PA-C Physician Assistant [facility] [email or web address]   Food Choices for Gastroesophageal Reflux Disease, Adult When you have gastroesophageal reflux disease (GERD), the foods you eat and your eating habits are very important. Choosing the right foods can help ease the discomfort of GERD. Consider working with a dietitian to help you make healthy food choices. What are tips for following this plan? Reading food labels Look for foods that are low in saturated fat. Foods that have less than 5% of daily value (DV) of fat and 0 g of trans fats may help with your symptoms. Cooking Cook foods using methods other than frying. This may include baking, steaming, grilling, or broiling. These are all methods that do not need a lot of fat for cooking. To add flavor, try to use herbs that are low in spice and acidity. Meal planning  Choose healthy foods that are low in fat, such as fruits, vegetables, whole grains, low-fat dairy products, lean meats, fish, and poultry. Eat frequent, small meals instead of three large meals each day. Eat your meals slowly, in a relaxed setting. Avoid bending over or lying down until 2-3 hours after eating. Limit high-fat foods such as fatty meats or fried foods. Limit your intake of fatty foods, such as oils, butter, and shortening. Avoid the following as told by your health care provider: Foods that cause symptoms. These may be different for different people. Keep a food diary to keep track of foods that cause  symptoms. Alcohol. Drinking large amounts of liquid with meals. Eating meals during the 2-3 hours before bed. Lifestyle Maintain a healthy weight. Ask your health care provider what weight is healthy for you. If you need to lose weight, work with your health care provider to do so safely. Exercise for at least 30 minutes on 5 or more days each week, or as told by your health care provider. Avoid wearing clothes that fit tightly around your waist and chest. Do not use any products that contain nicotine or tobacco. These products include cigarettes, chewing tobacco, and vaping devices, such as e-cigarettes. If you need help quitting, ask your health care provider. Sleep with the head of your bed raised. Use a wedge under the mattress or blocks under the bed frame to raise the head of the bed. Chew sugar-free gum after mealtimes. What foods should I eat? Eat a healthy, well-balanced diet of fruits, vegetables, whole grains, low-fat dairy products, lean meats, fish, and poultry. Each person is different. Foods that may trigger symptoms in one person may not trigger any symptoms in another person. Work with your health care provider to identify foods that are safe for you. The items listed above may not be a complete list of recommended foods and beverages. Contact a dietitian for more information. What foods should I avoid? Limiting some of these foods may help manage the symptoms of GERD. Everyone is different. Consult a dietitian or your health care provider to help you identify the exact foods to avoid, if any. Fruits Any fruits prepared with added fat. Any fruits that cause symptoms. For  some people this may include citrus fruits, such as oranges, grapefruit, pineapple, and lemons. Vegetables Deep-fried vegetables. [place] fries. Any vegetables prepared with added fat. Any vegetables that cause symptoms. For some people, this may include tomatoes and tomato products, chili peppers, onions and  garlic, and horseradish. Grains Pastries or quick breads with added fat. Meats and other proteins High-fat meats, such as fatty beef or pork, hot dogs, ribs, ham, sausage, salami, and bacon. Fried meat or protein, including fried fish and fried chicken. Nuts and nut butters, in large amounts. Dairy Whole milk and chocolate milk. Sour cream. Cream. Ice cream. Cream cheese. Milkshakes. Fats and oils Butter. Margarine. Shortening. Ghee. Beverages Coffee and tea, with or without caffeine. Carbonated beverages. Sodas. Energy drinks. Fruit juice made with acidic fruits, such as orange or grapefruit. Tomato juice. Alcoholic drinks. Sweets and desserts Chocolate and cocoa. Donuts. Seasonings and condiments Pepper. Peppermint and spearmint. Added salt. Any condiments, herbs, or seasonings that cause symptoms. For some people, this may include curry, hot sauce, or vinegar-based salad dressings. The items listed above may not be a complete list of foods and beverages to avoid. Contact a dietitian for more information. Questions to ask your health care provider Diet and lifestyle changes are usually the first steps that are taken to manage symptoms of GERD. If diet and lifestyle changes do not improve your symptoms, talk with your health care provider about taking medicines. Where to find more information [employer]: aboutgerd.org Summary When you have gastroesophageal reflux disease (GERD), food and lifestyle choices may be very helpful in easing the discomfort of GERD. Eat frequent, small meals instead of three large meals each day. Eat your meals slowly, in a relaxed setting. Avoid bending over or lying down until 2-3 hours after eating. Limit high-fat foods such as fatty meats or fried foods. This information is not intended to replace advice given to you by your health care provider. Make sure you discuss any questions you have with your health care  provider. Document Revised: 01/01/2020 Document Reviewed: 01/01/2020 Elsevier Patient Education  2022 [employer].

## 2021-06-05 DIAGNOSIS — F1010 Alcohol abuse, uncomplicated: Secondary | ICD-10-CM | POA: Insufficient documentation

## 2021-06-05 DIAGNOSIS — F5104 Psychophysiologic insomnia: Secondary | ICD-10-CM | POA: Insufficient documentation

## 2021-06-05 DIAGNOSIS — K219 Gastro-esophageal reflux disease without esophagitis: Secondary | ICD-10-CM | POA: Insufficient documentation

## 2021-06-09 ENCOUNTER — Other Ambulatory Visit (HOSPITAL_COMMUNITY): Payer: Self-pay

## 2021-06-09 ENCOUNTER — Telehealth: Payer: Self-pay

## 2021-06-09 NOTE — Telephone Encounter (Signed)
[facility] Patient Advocate Encounter ? ?Insurance verification completed.   ? ?The patient is uninsured and will need patient assistance for medication. ? ?We can complete the application and will need to meet with the patient for signatures and income documentation. ? ?Emlyn Maves, CPhT ?Specialty Pharmacy Patient Advocate ?[facility] ?Phone: [phone] ?Fax:  [phone]  ?

## 2021-06-12 ENCOUNTER — Encounter: Payer: Medicaid Other | Admitting: Internal Medicine

## 2021-06-13 ENCOUNTER — Other Ambulatory Visit: Payer: Self-pay

## 2021-06-13 ENCOUNTER — Emergency Department (HOSPITAL_COMMUNITY)
Admission: EM | Admit: 2021-06-13 | Discharge: 2021-06-14 | Disposition: A | Discharge status: Home / Self Care | Payer: Medicaid Other | Attending: Emergency Medicine | Admitting: Emergency Medicine

## 2021-06-13 DIAGNOSIS — Z87891 Personal history of nicotine dependence: Secondary | ICD-10-CM | POA: Insufficient documentation

## 2021-06-13 DIAGNOSIS — F1124 Opioid dependence with opioid-induced mood disorder: Secondary | ICD-10-CM | POA: Insufficient documentation

## 2021-06-13 DIAGNOSIS — R45851 Suicidal ideations: Secondary | ICD-10-CM | POA: Insufficient documentation

## 2021-06-13 DIAGNOSIS — Z20822 Contact with and (suspected) exposure to covid-19: Secondary | ICD-10-CM | POA: Insufficient documentation

## 2021-06-13 DIAGNOSIS — F32A Depression, unspecified: Secondary | ICD-10-CM | POA: Insufficient documentation

## 2021-06-14 ENCOUNTER — Other Ambulatory Visit (HOSPITAL_COMMUNITY)
Admission: EM | Admit: 2021-06-14 | Discharge: 2021-06-17 | Disposition: A | Discharge status: Home / Self Care | Payer: No Payment, Other | Attending: Nurse Practitioner | Admitting: Nurse Practitioner

## 2021-06-14 ENCOUNTER — Other Ambulatory Visit: Payer: Self-pay

## 2021-06-14 DIAGNOSIS — F5104 Psychophysiologic insomnia: Secondary | ICD-10-CM

## 2021-06-14 DIAGNOSIS — F1190 Opioid use, unspecified, uncomplicated: Secondary | ICD-10-CM | POA: Insufficient documentation

## 2021-06-14 DIAGNOSIS — F332 Major depressive disorder, recurrent severe without psychotic features: Secondary | ICD-10-CM | POA: Insufficient documentation

## 2021-06-14 DIAGNOSIS — R45851 Suicidal ideations: Secondary | ICD-10-CM | POA: Diagnosis not present

## 2021-06-14 LAB — CBC WITH DIFFERENTIAL/PLATELET
Abs Immature Granulocytes: 0.01 10*3/uL (ref 0.00–0.07)
Basophils Absolute: 0 10*3/uL (ref 0.0–0.1)
Basophils Relative: 0 %
Eosinophils Absolute: 0.1 10*3/uL (ref 0.0–0.5)
Eosinophils Relative: 1 %
HCT: 41.5 % (ref 39.0–52.0)
Hemoglobin: 13.7 g/dL (ref 13.0–17.0)
Immature Granulocytes: 0 %
Lymphocytes Relative: 28 %
Lymphs Abs: 1.9 10*3/uL (ref 0.7–4.0)
MCH: 29.3 pg (ref 26.0–34.0)
MCHC: 33 g/dL (ref 30.0–36.0)
MCV: 88.7 fL (ref 80.0–100.0)
Monocytes Absolute: 0.6 10*3/uL (ref 0.1–1.0)
Monocytes Relative: 9 %
Neutro Abs: 4.2 10*3/uL (ref 1.7–7.7)
Neutrophils Relative %: 62 %
Platelets: 221 10*3/uL (ref 150–400)
RBC: 4.68 MIL/uL (ref 4.22–5.81)
RDW: 12.9 % (ref 11.5–15.5)
WBC: 6.9 10*3/uL (ref 4.0–10.5)
nRBC: 0 % (ref 0.0–0.2)

## 2021-06-14 LAB — LIPID PANEL
Cholesterol: 146 mg/dL (ref 0–200)
HDL: 49 mg/dL (ref >40)
LDL Cholesterol: 86 mg/dL (ref 0–99)
Total CHOL/HDL Ratio: 3 RATIO
Triglycerides: 54 mg/dL (ref <150)
VLDL: 11 mg/dL (ref 0–40)

## 2021-06-14 LAB — COMPREHENSIVE METABOLIC PANEL
ALT: 51 U/L — ABNORMAL HIGH (ref 0–44)
AST: 53 U/L — ABNORMAL HIGH (ref 15–41)
Albumin: 3.9 g/dL (ref 3.5–5.0)
Alkaline Phosphatase: 73 U/L (ref 38–126)
Anion gap: 7 (ref 5–15)
BUN: 6 mg/dL (ref 6–20)
CO2: 28 mmol/L (ref 22–32)
Calcium: 9.3 mg/dL (ref 8.9–10.3)
Chloride: 101 mmol/L (ref 98–111)
Creatinine, Ser: 0.71 mg/dL (ref 0.61–1.24)
GFR, Estimated: >60 mL/min (ref >60)
Glucose, Bld: 96 mg/dL (ref 70–99)
Potassium: 4.1 mmol/L (ref 3.5–5.1)
Sodium: 136 mmol/L (ref 135–145)
Total Bilirubin: 0.3 mg/dL (ref 0.3–1.2)
Total Protein: 6.9 g/dL (ref 6.5–8.1)

## 2021-06-14 LAB — RESP PANEL BY RT-PCR (FLU A&B, COVID) ARPGX2
Influenza A by PCR: NEGATIVE
Influenza B by PCR: NEGATIVE
SARS Coronavirus 2 by RT PCR: NEGATIVE

## 2021-06-14 LAB — RAPID URINE DRUG SCREEN, HOSP PERFORMED
Amphetamines: POSITIVE — AB
Barbiturates: NOT DETECTED
Benzodiazepines: NOT DETECTED
Cocaine: NOT DETECTED
Opiates: NOT DETECTED
Tetrahydrocannabinol: NOT DETECTED

## 2021-06-14 LAB — MAGNESIUM: Magnesium: 2.2 mg/dL (ref 1.7–2.4)

## 2021-06-14 LAB — ETHANOL: Alcohol, Ethyl (B): <10 mg/dL (ref <10)

## 2021-06-14 MED ORDER — ACETAMINOPHEN 325 MG PO TABS: 650.0000 mg | ORAL_TABLET | Freq: Four times a day (QID) | ORAL | Status: DC | PRN

## 2021-06-14 MED ORDER — ADULT MULTIVITAMIN W/MINERALS CH
1.0000 | ORAL_TABLET | Freq: Every day | ORAL | Status: DC
  Administered 2021-06-14 – 2021-06-17 (×4): 1 via ORAL
  Filled 2021-06-14 (×4): qty 1

## 2021-06-14 MED ORDER — ALUM & MAG HYDROXIDE-SIMETH 200-200-20 MG/5ML PO SUSP: 30.0000 mL | ORAL | Status: DC | PRN

## 2021-06-14 MED ORDER — ONDANSETRON 4 MG PO TBDP: 4.0000 mg | ORAL_TABLET | Freq: Four times a day (QID) | ORAL | Status: AC | PRN

## 2021-06-14 MED ORDER — LORAZEPAM 1 MG PO TABS: 1.0000 mg | ORAL_TABLET | Freq: Two times a day (BID) | ORAL | Status: DC

## 2021-06-14 MED ORDER — LOPERAMIDE HCL 2 MG PO CAPS: 2.0000 mg | ORAL_CAPSULE | ORAL | Status: AC | PRN

## 2021-06-14 MED ORDER — TRAZODONE HCL 50 MG PO TABS
50.0000 mg | ORAL_TABLET | Freq: Every evening | ORAL | Status: DC | PRN
  Administered 2021-06-15: 50 mg via ORAL
  Filled 2021-06-14: qty 1

## 2021-06-14 MED ORDER — THIAMINE HCL 100 MG PO TABS
100.0000 mg | ORAL_TABLET | Freq: Every day | ORAL | Status: DC
  Administered 2021-06-15 – 2021-06-17 (×3): 100 mg via ORAL
  Filled 2021-06-14 (×3): qty 1

## 2021-06-14 MED ORDER — LORAZEPAM 1 MG PO TABS: 1.0000 mg | ORAL_TABLET | Freq: Three times a day (TID) | ORAL | Status: DC

## 2021-06-14 MED ORDER — LORAZEPAM 1 MG PO TABS: 1.0000 mg | ORAL_TABLET | Freq: Every day | ORAL | Status: DC

## 2021-06-14 MED ORDER — LORAZEPAM 1 MG PO TABS: 1.0000 mg | ORAL_TABLET | Freq: Four times a day (QID) | ORAL | Status: DC | PRN

## 2021-06-14 MED ORDER — VENLAFAXINE HCL ER 75 MG PO CP24
75.0000 mg | ORAL_CAPSULE | Freq: Every day | ORAL | Status: DC
  Administered 2021-06-14 – 2021-06-17 (×4): 75 mg via ORAL
  Filled 2021-06-14 (×4): qty 1

## 2021-06-14 MED ORDER — MAGNESIUM HYDROXIDE 400 MG/5ML PO SUSP: 30.0000 mL | Freq: Every day | ORAL | Status: DC | PRN

## 2021-06-14 MED ORDER — HYDROXYZINE HCL 25 MG PO TABS: 25.0000 mg | ORAL_TABLET | Freq: Four times a day (QID) | ORAL | Status: AC | PRN

## 2021-06-14 MED ORDER — LORAZEPAM 1 MG PO TABS: 1.0000 mg | ORAL_TABLET | Freq: Four times a day (QID) | ORAL | Status: DC

## 2021-06-14 MED ORDER — HYDROXYZINE HCL 25 MG PO TABS: 25.0000 mg | ORAL_TABLET | Freq: Three times a day (TID) | ORAL | Status: DC | PRN

## 2021-06-14 NOTE — ED Notes (Signed)
Pt sitting in dining room watching TV. A&O x4, calm and cooperative. Pt denies current SI/HI/AVH. No signs of acute distress noted. Will continue to monitor for safety.
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
## 2021-06-14 NOTE — ED Notes (Signed)
Pt asleep in bed. Respirations even and unlabored. Will continue to monitor for safety. ?

## 2021-06-14 NOTE — Progress Notes (Signed)
Dinner given

## 2021-06-14 NOTE — ED Provider Notes (Signed)
Behavioral Health Admission H&P [facility] & OBS)  Date: 06/14/21 Patient Name: Jared Jordan MRN: [id number] Chief Complaint: No chief complaint on file.  Suicidal Ideations    Diagnoses:  Final diagnoses:  Opioid use disorder  MDD (major depressive disorder), recurrent severe, without psychosis ([place])  Suicidal ideation    HPI: Jared Jordan is a 30 y/o single male with MDD, opioid use d/o and suicidal ideation and presented to [facility] for suicidal ideation. Patient was recently dc'ed from [facility] for substance abuse treatment. Patient continues to endorse suicidal ideations at this time. Patient has a long history of opioid abuse starting in his early 31s after experiencing an injury while playing hockey that started with pills and progressed to a 20.00 day IV heroin use. Patient is alert oriented x3, does not appear to be responding to any internal or external stimuli or having any thought blocking. Patient wants to come inpatient and will be admitted to [facility] for safety and crisis stabilization.   Per  TTS note :  CCA Screening, Triage and Referral (STR) Jared Jordan is a 30 year old patient who came to the [facility] due to ongoing SI.    Pt states, "It's been a really rough week. I got out of rehab on Sunday. I was staying with a friend and after a day or 2 it wasn't the right environment for my sobriety. II was going to get a hotel but I lost my debit card. I went to a different friend's house and they had some drinks and I don't drink. It got kinda hostile and they thought I was a cop. I needed to leave and figure somehting out but they wouldn't let me leave. I was a heroin user back in the day. To prove I wasn't a cop I had to snort something and I overdosed. The people knew I was going to overdose. My friend was mad because they didn't want to save my life; he had to fight someone to get some Narcan. I forgot my bag there. I went ot a different friend's house on Thursday; he had a gun  and I tried to shoot myself but it wasn't loaded. He freaked out and took the gun from me. I feel like I need more treatment."    Pt confirms he is still experiencing SI at this time; he states his plan is to walk off of a bridge or get a knife and cut himself. He denies HI, AVH, NSSIB, access to guns/weapons ("not anymore"), or engagement wtih the legal system. He denies SA; he states he was clean for 2 months prior to relapsing this week. Pt states he was at [facility] for 40 days until his d/c on Sunday.   Pt is oriented x5. His recent/remote memory is intact. Pt was cooperative, though tearful, throughout the assessment process. Pt's insight, judgement, and impulse control is impaired at this time.     PHQ 2-9:  Jared Jordan ED from 06/13/2021 in [place] Office Visit from 06/04/2021 in [facility] Office Visit from 05/20/2021 in [place]  Thoughts that you would be better off dead, or of hurting yourself in some way Several days Not at all Not at all  PHQ-9 Total Score 6 1 0       [place] ED from 06/13/2021 in [place] Most recent reading at 06/14/2021 12:49 AM Admission (Discharged) from 04/28/2021 in [facility]  ADULT 400B Most recent reading at 04/28/2021 10:20 PM ED from 04/28/2021 in [facility] DEPT Most recent reading at 04/28/2021  6:41 AM  C-SSRS RISK CATEGORY High Risk High Risk High Risk        Total Time spent with patient: 30 minutes  Musculoskeletal  Strength & Muscle Tone: within normal limits Gait & Station: normal Patient leans: N/A  Psychiatric Specialty Exam  Presentation General Appearance: Appropriate for Environment  Eye Contact:Good  Speech:Clear and Coherent  Speech Volume:Normal  Handedness:Right   Mood and Affect   Mood:Depressed  Affect:Appropriate   Thought Process  Thought Processes:Coherent  Descriptions of Associations:Intact  Orientation:Full (Time, Place and Person)  Thought Content:WDL  Diagnosis of Schizophrenia or Schizoaffective disorder in past: No   Hallucinations:Hallucinations: None  Ideas of Reference:None  Suicidal Thoughts:No data recorded Homicidal Thoughts:Homicidal Thoughts: Yes, Active HI Active Intent and/or Plan: With Intent; With Plan   Sensorium  Memory:Immediate Good; Recent Good; Remote Good  Judgment:Poor  Insight:Poor   Executive Functions  Concentration:Good  Attention Span:Good  [place] of Knowledge:Good  Language:Good   Psychomotor Activity  Psychomotor Activity:Psychomotor Activity: Normal   Assets  Assets:Communication Skills; Desire for Improvement; Physical Health; Resilience; Social Support; Catering manager; Housing   Sleep  Sleep:Sleep: Fair Number of Hours of Sleep: -1   Nutritional Assessment (For OBS and [name] admissions only) Has the patient had a weight loss or gain of 10 pounds or more in the last 3 months?: No Has the patient had a decrease in food intake/or appetite?: No Does the patient have eating habits or behaviors that may be indicators of an eating disorder including binging or inducing vomiting?: No Has the patient recently lost weight without trying?: 0 Has the patient been eating poorly because of a decreased appetite?: 0 Malnutrition Screening Tool Score: 0    Physical Exam HENT:     Head: Normocephalic and atraumatic.     Right Ear: External ear normal.     Left Ear: External ear normal.     Nose: Nose normal.  Eyes:     Pupils: Pupils are equal, round, and reactive to light.  Cardiovascular:     Rate and Rhythm: Normal rate.  Pulmonary:     Effort: Pulmonary effort is normal.  Abdominal:     General: Bowel sounds are normal.  Musculoskeletal:        General: Normal  range of motion.     Cervical back: Normal range of motion.  Skin:    General: Skin is warm.  Neurological:     Mental Status: He is alert and oriented to person, place, and time.  Psychiatric:        Attention and Perception: Attention normal.        Mood and Affect: Mood is depressed.        Speech: Speech normal.        Behavior: Behavior normal.        Thought Content: Thought content includes suicidal ideation. Thought content does not include homicidal ideation. Thought content includes suicidal plan. Thought content does not include homicidal plan.        Cognition and Memory: Cognition normal.        Judgment: Judgment is impulsive.   Review of Systems  Constitutional: Negative.   HENT: Negative.    Eyes: Negative.   Respiratory: Negative.    Cardiovascular: Negative.   Gastrointestinal: Negative.   Genitourinary: Negative.   Musculoskeletal: Negative.   Skin: Negative.  Neurological: Negative.   Endo/Heme/Allergies: Negative.   Psychiatric/Behavioral:  Positive for depression, substance abuse and suicidal ideas.    Blood pressure (!) 128/98, pulse 68, temperature 97.7 F (36.5 C), temperature source Tympanic, resp. rate 18, SpO2 100 %. There is no height or weight on file to calculate BMI.  Past Psychiatric History: [facility] 2022, [name]-2022  Is the patient at risk to self? Yes  Has the patient been a risk to self in the past 6 months? Yes .    Has the patient been a risk to self within the distant past? No   Is the patient a risk to others? No   Has the patient been a risk to others in the past 6 months? No   Has the patient been a risk to others within the distant past? No   Past Medical History:  Past Medical History:  Diagnosis Date   Anxiety    Depression    Drug use     Past Surgical History:  Procedure Laterality Date   SHOULDER SURGERY     SHOULDER SURGERY Left 2020    Family History: No family history on file.  Social History:  Social History    Socioeconomic History   Marital status: Single    Spouse name: Not on file   Number of children: Not on file   Years of education: Not on file   Highest education level: Not on file  Occupational History   Not on file  Tobacco Use   Smoking status: Former    Packs/day: 0.25    Years: 1.00    Pack years: 0.25    Types: Cigarettes   Smokeless tobacco: Former    Quit date: 04/15/2021  Vaping Use   Vaping Use: Some days   Start date: 11/04/2018   Devices: Jewel Menthol  Substance and Sexual Activity   Alcohol use: Not Currently    Alcohol/week: 4.0 - 6.0 standard drinks    Types: 4 - 6 Cans of beer per week    Comment: normally, 4-6 beers weekly; last drank 2 bottles of champagne yesterday   Drug use: Not Currently    Types: IV, Heroin, Marijuana, Other-see comments    Comment: using 1gm everyday heroin - for past 3 weeks   Sexual activity: Not Currently  Other Topics Concern   Not on file  Social History Narrative   Pt is grieving the death of his father last year. 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 Lives alone and counts his mom as social support. Has siblings but doesn't speak to them.   Social Determinants of Health   Financial Resource Strain: Not on file  Food Insecurity: Not on file  Transportation Needs: Not on file  Physical Activity: Not on file  Stress: Not on file  Social Connections: Not on file  Intimate Partner Violence: Not on file    SDOH:  SDOH Screenings   Alcohol Screen: Medium Risk   Last Alcohol Screening Score (AUDIT): 9  Depression (PHQ2-9): Medium Risk   PHQ-2 Score: 6  Financial Resource Strain: Not on file  Food Insecurity: Not on file  Housing: Not on file  Physical Activity: Not on file  Social Connections: Not on file  Stress: Not on file  Tobacco Use: Medium Risk   Smoking Tobacco Use: Former   Smokeless Tobacco Use: Former   Passive Exposure: Not on Pensions consultant Needs: Not on file    Last Labs:   Admission on  06/13/2021, Discharged on 06/14/2021  Component Date Value Ref Range Status   WBC 06/14/2021 6.9  4.0 - 10.5 K/uL Final   RBC 06/14/2021 4.68  4.22 - 5.81 MIL/uL Final   Hemoglobin 06/14/2021 13.7  13.0 - 17.0 g/dL Final   HCT 16/10/960412/04/2021 41.5  39.0 - 52.0 % Final   MCV 06/14/2021 88.7  80.0 - 100.0 fL Final   MCH 06/14/2021 29.3  26.0 - 34.0 pg Final   MCHC 06/14/2021 33.0  30.0 - 36.0 g/dL Final   RDW 54/09/811912/04/2021 12.9  11.5 - 15.5 % Final   Platelets 06/14/2021 221  150 - 400 K/uL Final   nRBC 06/14/2021 0.0  0.0 - 0.2 % Final   Neutrophils Relative % 06/14/2021 62  % Final   Neutro Abs 06/14/2021 4.2  1.7 - 7.7 K/uL Final   Lymphocytes Relative 06/14/2021 28  % Final   Lymphs Abs 06/14/2021 1.9  0.7 - 4.0 K/uL Final   Monocytes Relative 06/14/2021 9  % Final   Monocytes Absolute 06/14/2021 0.6  0.1 - 1.0 K/uL Final   Eosinophils Relative 06/14/2021 1  % Final   Eosinophils Absolute 06/14/2021 0.1  0.0 - 0.5 K/uL Final   Basophils Relative 06/14/2021 0  % Final   Basophils Absolute 06/14/2021 0.0  0.0 - 0.1 K/uL Final   Immature Granulocytes 06/14/2021 0  % Final   Abs Immature Granulocytes 06/14/2021 0.01  0.00 - 0.07 K/uL Final   Performed at [facility], [address] [place], [place] [address]   Sodium 06/14/2021 136  135 - 145 mmol/L Final   Potassium 06/14/2021 4.1  3.5 - 5.1 mmol/L Final   Chloride 06/14/2021 101  98 - 111 mmol/L Final   CO2 06/14/2021 28  22 - 32 mmol/L Final   Glucose, Bld 06/14/2021 96  70 - 99 mg/dL Final   Glucose reference range applies only to samples taken after fasting for at least 8 hours.   BUN 06/14/2021 6  6 - 20 mg/dL Final   Creatinine, Ser 06/14/2021 0.71  0.61 - 1.24 mg/dL Final   Calcium 95/62/130812/04/2021 9.3  8.9 - 10.3 mg/dL Final   Total Protein 65/78/469612/04/2021 6.9  6.5 - 8.1 g/dL Final   Albumin 29/52/841312/04/2021 3.9  3.5 - 5.0 g/dL Final   AST 24/40/102712/04/2021 53 (H)  15 - 41 U/L Final   ALT 06/14/2021 51 (H)  0 - 44 U/L Final    Alkaline Phosphatase 06/14/2021 73  38 - 126 U/L Final   Total Bilirubin 06/14/2021 0.3  0.3 - 1.2 mg/dL Final   GFR, Estimated 06/14/2021 >60  >60 mL/min Final   Comment: (NOTE) Calculated using the CKD-EPI Creatinine Equation (2021)    Anion gap 06/14/2021 7  5 - 15 Final   Performed at [facility], [address] [place], [place] [address]   Alcohol, Ethyl (B) 06/14/2021 <10  <10 mg/dL Final   Comment: (NOTE) Lowest detectable limit for serum alcohol is 10 mg/dL.  For medical purposes only. Performed at [facility], [address] [place], [place] [address]    Opiates 06/14/2021 NONE DETECTED  NONE DETECTED Final   Cocaine 06/14/2021 NONE DETECTED  NONE DETECTED Final   Benzodiazepines 06/14/2021 NONE DETECTED  NONE DETECTED Final   Amphetamines 06/14/2021 POSITIVE (A)  NONE DETECTED Final   Tetrahydrocannabinol 06/14/2021 NONE DETECTED  NONE DETECTED Final   Barbiturates 06/14/2021 NONE DETECTED  NONE DETECTED Final   Comment: (NOTE) DRUG SCREEN  FOR MEDICAL PURPOSES ONLY.  IF CONFIRMATION IS NEEDED FOR ANY PURPOSE, NOTIFY LAB WITHIN 5 DAYS.  LOWEST DETECTABLE LIMITS FOR URINE DRUG SCREEN Drug Class                     Cutoff (ng/mL) Amphetamine and metabolites    1000 Barbiturate and metabolites    200 Benzodiazepine                 [id number] Tricyclics and metabolites     300 Opiates and metabolites        300 Cocaine and metabolites        300 THC                            50 Performed at [facility], [place] [address] [place], [place] [address]    SARS Coronavirus 2 by RT PCR 06/14/2021 NEGATIVE  NEGATIVE Final   Comment: (NOTE) SARS-CoV-2 target nucleic acids are NOT DETECTED.  The SARS-CoV-2 RNA is generally detectable in upper respiratory specimens during the acute phase of infection. The lowest concentration of SARS-CoV-2 viral copies this assay can detect is 138 copies/mL. A negative result does not preclude SARS-Cov-2 infection and should  not be used as the sole basis for treatment or other patient management decisions. A negative result may occur with  improper specimen collection/handling, submission of specimen other than nasopharyngeal swab, presence of viral mutation(s) within the areas targeted by this assay, and inadequate number of viral copies(<138 copies/mL). A negative result must be combined with clinical observations, patient history, and epidemiological information. The expected result is Negative.  Fact Sheet for Patients:  EntrepreneurPulse.com.au  Fact Sheet for Healthcare Providers:  IncredibleEmployment.be  This test is no                          t yet approved or cleared by the [place] FDA and  has been authorized for detection and/or diagnosis of SARS-CoV-2 by FDA under an Emergency Use Authorization (EUA). This EUA will remain  in effect (meaning this test can be used) for the duration of the COVID-19 declaration under Section 564(b)(1) of the Act, 21 U.S.C.section 360bbb-3(b)(1), unless the authorization is terminated  or revoked sooner.       Influenza A by PCR 06/14/2021 NEGATIVE  NEGATIVE Final   Influenza B by PCR 06/14/2021 NEGATIVE  NEGATIVE Final   Comment: (NOTE) The Xpert Xpress SARS-CoV-2/FLU/RSV plus assay is intended as an aid in the diagnosis of influenza from Nasopharyngeal swab specimens and should not be used as a sole basis for treatment. Nasal washings and aspirates are unacceptable for Xpert Xpress SARS-CoV-2/FLU/RSV testing.  Fact Sheet for Patients: EntrepreneurPulse.com.au  Fact Sheet for Healthcare Providers: IncredibleEmployment.be  This test is not yet approved or cleared by the [place] FDA and has been authorized for detection and/or diagnosis of SARS-CoV-2 by FDA under an Emergency Use Authorization (EUA). This EUA will remain in effect (meaning this test can be used) for the  duration of the COVID-19 declaration under Section 564(b)(1) of the Act, 21 U.S.C. section 360bbb-3(b)(1), unless the authorization is terminated or revoked.  Performed at [facility], [place] [address] [place], [place] [address]   Admission on 04/28/2021, Discharged on 05/05/2021  Component Date Value Ref Range Status   TSH 04/29/2021 0.392  0.350 - 4.500 uIU/mL Final   Comment: Performed by a  3rd Generation assay with a functional sensitivity of <=0.01 uIU/mL. Performed at [facility], [place] [address] [place], [place] [address]    Hgb A1c MFr Bld 04/29/2021 5.4  4.8 - 5.6 % Final   Comment: (NOTE) Pre diabetes:          5.7%-6.4%  Diabetes:              >6.4%  Glycemic control for   <7.0% adults with diabetes    Mean Plasma Glucose 04/29/2021 108.28  mg/dL Final   Performed at [facility], [place] [address], [place], [place] [address]   Cholesterol 04/29/2021 202 (H)  0 - 200 mg/dL Final   Triglycerides 04/29/2021 134  <150 mg/dL Final   HDL 04/29/2021 63  >40 mg/dL Final   Total CHOL/HDL Ratio 04/29/2021 3.2  RATIO Final   VLDL 04/29/2021 27  0 - 40 mg/dL Final   LDL Cholesterol 04/29/2021 112 (H)  0 - 99 mg/dL Final   Comment:        Total Cholesterol/HDL:CHD Risk Coronary Heart Disease Risk Table                     Men   Women  1/2 Average Risk   3.4   3.3  Average Risk       5.0   4.4  2 X Average Risk   9.6   7.1  3 X Average Risk  23.4   11.0        Use the calculated Patient Ratio above and the CHD Risk Table to determine the patient's CHD Risk.        ATP III CLASSIFICATION (LDL):  <100     mg/dL   Optimal  100-129  mg/dL   Near or Above                    Optimal  130-159  mg/dL   Borderline  160-189  mg/dL   High  >190     mg/dL   Very High Performed at [place] [address] [place], [place] [address]    Sodium 04/30/2021 134 (L)  135 - 145 mmol/L Final   Potassium 04/30/2021 4.0  3.5 - 5.1  mmol/L Final   Chloride 04/30/2021 102  98 - 111 mmol/L Final   CO2 04/30/2021 23  22 - 32 mmol/L Final   Glucose, Bld 04/30/2021 112 (H)  70 - 99 mg/dL Final   Glucose reference range applies only to samples taken after fasting for at least 8 hours.   BUN 04/30/2021 16  6 - 20 mg/dL Final   Creatinine, Ser 04/30/2021 0.61  0.61 - 1.24 mg/dL Final   Calcium 04/30/2021 9.0  8.9 - 10.3 mg/dL Final   GFR, Estimated 04/30/2021 >60  >60 mL/min Final   Comment: (NOTE) Calculated using the CKD-EPI Creatinine Equation (2021)    Anion gap 04/30/2021 9  5 - 15 Final   Performed at [facility], [place] [address] [place], [place] [address]   Sodium 05/02/2021 137  135 - 145 mmol/L Final   Potassium 05/02/2021 4.3  3.5 - 5.1 mmol/L Final   Chloride 05/02/2021 104  98 - 111 mmol/L Final   CO2 05/02/2021 25  22 - 32 mmol/L Final   Glucose, Bld 05/02/2021 102 (H)  70 - 99 mg/dL Final   Glucose reference range applies only to samples taken after fasting for at least 8  hours.   BUN 05/02/2021 19  6 - 20 mg/dL Final   Creatinine, Ser 05/02/2021 0.65  0.61 - 1.24 mg/dL Final   Calcium 05/02/2021 9.2  8.9 - 10.3 mg/dL Final   GFR, Estimated 05/02/2021 >60  >60 mL/min Final   Comment: (NOTE) Calculated using the CKD-EPI Creatinine Equation (2021)    Anion gap 05/02/2021 8  5 - 15 Final   Performed at [facility], [place] [address], [place], [place] [address]  Admission on 04/28/2021, Discharged on 04/28/2021  Component Date Value Ref Range Status   Sodium 04/28/2021 136  135 - 145 mmol/L Final   Potassium 04/28/2021 3.4 (L)  3.5 - 5.1 mmol/L Final   Chloride 04/28/2021 106  98 - 111 mmol/L Final   CO2 04/28/2021 22  22 - 32 mmol/L Final   Glucose, Bld 04/28/2021 108 (H)  70 - 99 mg/dL Final   Glucose reference range applies only to samples taken after fasting for at least 8 hours.   BUN 04/28/2021 13  6 - 20 mg/dL Final   Creatinine, Ser 04/28/2021 0.66  0.61 - 1.24  mg/dL Final   Calcium 04/28/2021 9.0  8.9 - 10.3 mg/dL Final   Total Protein 04/28/2021 7.4  6.5 - 8.1 g/dL Final   Albumin 04/28/2021 4.2  3.5 - 5.0 g/dL Final   AST 04/28/2021 32  15 - 41 U/L Final   ALT 04/28/2021 36  0 - 44 U/L Final   Alkaline Phosphatase 04/28/2021 81  38 - 126 U/L Final   Total Bilirubin 04/28/2021 0.3  0.3 - 1.2 mg/dL Final   GFR, Estimated 04/28/2021 >60  >60 mL/min Final   Comment: (NOTE) Calculated using the CKD-EPI Creatinine Equation (2021)    Anion gap 04/28/2021 8  5 - 15 Final   Performed at [facility], [place] [address], [place], [place] [address]   Alcohol, Ethyl (B) 04/28/2021 136 (H)  <10 mg/dL Final   Comment: (NOTE) Lowest detectable limit for serum alcohol is 10 mg/dL.  For medical purposes only. Performed at [facility], [place] [address] [place], [place] [id number]    Salicylate Lvl [id number] <7.0 (L)  7.0 - 30.0 mg/dL Final   Performed at [place] [address], [place], [place] [address]   Acetaminophen (Tylenol), Serum 04/28/2021 <10 (L)  10 - 30 ug/mL Final   Comment: (NOTE) Therapeutic concentrations vary significantly. A range of 10-30 ug/mL  may be an effective concentration for many patients. However, some  are best treated at concentrations outside of this range. Acetaminophen concentrations >150 ug/mL at 4 hours after ingestion  and >50 ug/mL at 12 hours after ingestion are often associated with  toxic reactions.  Performed at [facility], [place] [address] [place], [place] [address]    WBC 04/28/2021 8.2  4.0 - 10.5 K/uL Final   RBC 04/28/2021 4.35  4.22 - 5.81 MIL/uL Final   Hemoglobin 04/28/2021 12.6 (L)  13.0 - 17.0 g/dL Final   HCT 04/28/2021 38.3 (L)  39.0 - 52.0 % Final   MCV 04/28/2021 88.0  80.0 - 100.0 fL Final   MCH 04/28/2021 29.0  26.0 - 34.0 pg Final   MCHC 04/28/2021 32.9  30.0 - 36.0 g/dL Final   RDW 04/28/2021 13.8  11.5 - 15.5  % Final   Platelets 04/28/2021 329  150 - 400 K/uL Final   nRBC 04/28/2021 0.0  0.0 - 0.2 % Final   Performed  at [facility], [place] [address] [place], [place] [address]   Opiates 04/28/2021 NONE DETECTED  NONE DETECTED Final   Cocaine 04/28/2021 NONE DETECTED  NONE DETECTED Final   Benzodiazepines 04/28/2021 NONE DETECTED  NONE DETECTED Final   Amphetamines 04/28/2021 POSITIVE (A)  NONE DETECTED Final   Tetrahydrocannabinol 04/28/2021 NONE DETECTED  NONE DETECTED Final   Barbiturates 04/28/2021 NONE DETECTED  NONE DETECTED Final   Comment: (NOTE) DRUG SCREEN FOR MEDICAL PURPOSES ONLY.  IF CONFIRMATION IS NEEDED FOR ANY PURPOSE, NOTIFY LAB WITHIN 5 DAYS.  LOWEST DETECTABLE LIMITS FOR URINE DRUG SCREEN Drug Class                     Cutoff (ng/mL) Amphetamine and metabolites    1000 Barbiturate and metabolites    200 Benzodiazepine                 [id number] Tricyclics and metabolites     300 Opiates and metabolites        300 Cocaine and metabolites        300 THC                            50 Performed at [facility], [place] [address], [place],  [address]    SARS Coronavirus 2 by RT PCR 04/28/2021 NEGATIVE  NEGATIVE Final   Comment: (NOTE) SARS-CoV-2 target nucleic acids are NOT DETECTED.  The SARS-CoV-2 RNA is generally detectable in upper respiratory specimens during the acute phase of infection. The lowest concentration of SARS-CoV-2 viral copies this assay can detect is 138 copies/mL. A negative result does not preclude SARS-Cov-2 infection and should not be used as the sole basis for treatment or other patient management decisions. A negative result may occur with  improper specimen collection/handling, submission of specimen other than nasopharyngeal swab, presence of viral mutation(s) within the areas targeted by this assay, and inadequate number of viral copies(<138 copies/mL). A negative result must be combined with clinical  observations, patient history, and epidemiological information. The expected result is Negative.  Fact Sheet for Patients:  EntrepreneurPulse.com.au  Fact Sheet for Healthcare Providers:  IncredibleEmployment.be  This test is no                          t yet approved or cleared by the [place] FDA and  has been authorized for detection and/or diagnosis of SARS-CoV-2 by FDA under an Emergency Use Authorization (EUA). This EUA will remain  in effect (meaning this test can be used) for the duration of the COVID-19 declaration under Section 564(b)(1) of the Act, 21 U.S.C.section 360bbb-3(b)(1), unless the authorization is terminated  or revoked sooner.       Influenza A by PCR 04/28/2021 NEGATIVE  NEGATIVE Final   Influenza B by PCR 04/28/2021 NEGATIVE  NEGATIVE Final   Comment: (NOTE) The Xpert Xpress SARS-CoV-2/FLU/RSV plus assay is intended as an aid in the diagnosis of influenza from Nasopharyngeal swab specimens and should not be used as a sole basis for treatment. Nasal washings and aspirates are unacceptable for Xpert Xpress SARS-CoV-2/FLU/RSV testing.  Fact Sheet for Patients: EntrepreneurPulse.com.au  Fact Sheet for Healthcare Providers: IncredibleEmployment.be  This test is not yet approved or cleared by the [place] FDA and has been authorized for detection and/or diagnosis of SARS-CoV-2 by FDA under an Emergency Use Authorization (EUA). This EUA will remain in  effect (meaning this test can be used) for the duration of the COVID-19 declaration under Section 564(b)(1) of the Act, 21 U.S.C. section 360bbb-3(b)(1), unless the authorization is terminated or revoked.  Performed at [facility], [place] [address], [place], [place] [address]   Admission on 03/19/2021, Discharged on 03/20/2021  Component Date Value Ref Range Status   Sodium 03/19/2021 138  135 - 145 mmol/L  Final   Potassium 03/19/2021 4.0  3.5 - 5.1 mmol/L Final   Chloride 03/19/2021 102  98 - 111 mmol/L Final   CO2 03/19/2021 29  22 - 32 mmol/L Final   Glucose, Bld 03/19/2021 141 (H)  70 - 99 mg/dL Final   Glucose reference range applies only to samples taken after fasting for at least 8 hours.   BUN 03/19/2021 16  6 - 20 mg/dL Final   Creatinine, Ser 03/19/2021 1.05  0.61 - 1.24 mg/dL Final   Calcium 03/19/2021 9.1  8.9 - 10.3 mg/dL Final   Total Protein 03/19/2021 6.4 (L)  6.5 - 8.1 g/dL Final   Albumin 03/19/2021 3.8  3.5 - 5.0 g/dL Final   AST 03/19/2021 30  15 - 41 U/L Final   ALT 03/19/2021 35  0 - 44 U/L Final   Alkaline Phosphatase 03/19/2021 75  38 - 126 U/L Final   Total Bilirubin 03/19/2021 0.8  0.3 - 1.2 mg/dL Final   GFR, Estimated 03/19/2021 >60  >60 mL/min Final   Comment: (NOTE) Calculated using the CKD-EPI Creatinine Equation (2021)    Anion gap 03/19/2021 7  5 - 15 Final   Performed at [facility] [place] [address], [place],  [address]   Alcohol, Ethyl (B) 03/19/2021 <10  <10 mg/dL Final   Comment: (NOTE) Lowest detectable limit for serum alcohol is 10 mg/dL.  For medical purposes only. Performed at [facility], [place] [address] [place], [place] [id number]    Salicylate Lvl [id number] <7.0 (L)  7.0 - 30.0 mg/dL Final   Performed at [facility] [address] [place], [place] [address]   Acetaminophen (Tylenol), Serum 03/19/2021 <10 (L)  10 - 30 ug/mL Final   Comment: (NOTE) Therapeutic concentrations vary significantly. A range of 10-30 ug/mL  may be an effective concentration for many patients. However, some  are best treated at concentrations outside of this range. Acetaminophen concentrations >150 ug/mL at 4 hours after ingestion  and >50 ug/mL at 12 hours after ingestion are often associated with  toxic reactions.  Performed at [facility], [place] [address] [place], [place] [address]    WBC 03/19/2021 7.3  4.0 - 10.5 K/uL  Final   RBC 03/19/2021 4.32  4.22 - 5.81 MIL/uL Final   Hemoglobin 03/19/2021 12.4 (L)  13.0 - 17.0 g/dL Final   HCT 03/19/2021 38.6 (L)  39.0 - 52.0 % Final   MCV 03/19/2021 89.4  80.0 - 100.0 fL Final   MCH 03/19/2021 28.7  26.0 - 34.0 pg Final   MCHC 03/19/2021 32.1  30.0 - 36.0 g/dL Final   RDW 03/19/2021 13.4  11.5 - 15.5 % Final   Platelets 03/19/2021 220  150 - 400 K/uL Final   nRBC 03/19/2021 0.0  0.0 - 0.2 % Final   Performed at [facility] [address] [place],  [address]   Glucose-Capillary 03/19/2021 95  70 - 99 mg/dL Final   Glucose reference range applies only to samples taken after fasting for at least  8 hours.  Admission on 03/14/2021, Discharged on 03/14/2021  Component Date Value Ref Range Status   SARS Coronavirus 2 by RT PCR 03/14/2021 NEGATIVE  NEGATIVE Final   Comment: (NOTE) SARS-CoV-2 target nucleic acids are NOT DETECTED.  The SARS-CoV-2 RNA is generally detectable in upper respiratory specimens during the acute phase of infection. The lowest concentration of SARS-CoV-2 viral copies this assay can detect is 138 copies/mL. A negative result does not preclude SARS-Cov-2 infection and should not be used as the sole basis for treatment or other patient management decisions. A negative result may occur with  improper specimen collection/handling, submission of specimen other than nasopharyngeal swab, presence of viral mutation(s) within the areas targeted by this assay, and inadequate number of viral copies(<138 copies/mL). A negative result must be combined with clinical observations, patient history, and epidemiological information. The expected result is Negative.  Fact Sheet for Patients:  BloggerCourse.com  Fact Sheet for Healthcare Providers:  SeriousBroker.it  This test is no                          t yet approved or cleared by the [place] FDA and  has been authorized for  detection and/or diagnosis of SARS-CoV-2 by FDA under an Emergency Use Authorization (EUA). This EUA will remain  in effect (meaning this test can be used) for the duration of the COVID-19 declaration under Section 564(b)(1) of the Act, 21 U.S.C.section 360bbb-3(b)(1), unless the authorization is terminated  or revoked sooner.       Influenza A by PCR 03/14/2021 NEGATIVE  NEGATIVE Final   Influenza B by PCR 03/14/2021 NEGATIVE  NEGATIVE Final   Comment: (NOTE) The Xpert Xpress SARS-CoV-2/FLU/RSV plus assay is intended as an aid in the diagnosis of influenza from Nasopharyngeal swab specimens and should not be used as a sole basis for treatment. Nasal washings and aspirates are unacceptable for Xpert Xpress SARS-CoV-2/FLU/RSV testing.  Fact Sheet for Patients: BloggerCourse.com  Fact Sheet for Healthcare Providers: SeriousBroker.it  This test is not yet approved or cleared by the [place] FDA and has been authorized for detection and/or diagnosis of SARS-CoV-2 by FDA under an Emergency Use Authorization (EUA). This EUA will remain in effect (meaning this test can be used) for the duration of the COVID-19 declaration under Section 564(b)(1) of the Act, 21 U.S.C. section 360bbb-3(b)(1), unless the authorization is terminated or revoked.  Performed at [facility], [address], [place], [place] [address]    Sodium 03/14/2021 139  135 - 145 mmol/L Final   Potassium 03/14/2021 4.5  3.5 - 5.1 mmol/L Final   Chloride 03/14/2021 104  98 - 111 mmol/L Final   CO2 03/14/2021 24  22 - 32 mmol/L Final   Glucose, Bld 03/14/2021 77  70 - 99 mg/dL Final   Glucose reference range applies only to samples taken after fasting for at least 8 hours.   BUN 03/14/2021 13  6 - 20 mg/dL Final   Creatinine, Ser 03/14/2021 0.70  0.61 - 1.24 mg/dL Final   Calcium 70/62/3762 9.8  8.9 - 10.3 mg/dL Final   Total Protein 83/15/1761  7.6  6.5 - 8.1 g/dL Final   Albumin 60/73/7106 4.4  3.5 - 5.0 g/dL Final   AST 26/94/8546 54 (H)  15 - 41 U/L Final   ALT 03/14/2021 66 (H)  0 - 44 U/L Final   Alkaline Phosphatase 03/14/2021 78  38 - 126 U/L Final   Total  Bilirubin 03/14/2021 0.4  0.3 - 1.2 mg/dL Final   GFR, Estimated 03/14/2021 >60  >60 mL/min Final   Comment: (NOTE) Calculated using the CKD-EPI Creatinine Equation (2021)    Anion gap 03/14/2021 11  5 - 15 Final   Performed at [facility], [address], [place], [place] [address]   Alcohol, Ethyl (B) 03/14/2021 <10  <10 mg/dL Final   Comment: (NOTE) Lowest detectable limit for serum alcohol is 10 mg/dL.  For medical purposes only. Performed at [facility], [address] [place], [place] [address]    Opiates 03/14/2021 NONE DETECTED  NONE DETECTED Final   Cocaine 03/14/2021 NONE DETECTED  NONE DETECTED Final   Benzodiazepines 03/14/2021 NONE DETECTED  NONE DETECTED Final   Amphetamines 03/14/2021 NONE DETECTED  NONE DETECTED Final   Tetrahydrocannabinol 03/14/2021 NONE DETECTED  NONE DETECTED Final   Barbiturates 03/14/2021 NONE DETECTED  NONE DETECTED Final   Comment: (NOTE) DRUG SCREEN FOR MEDICAL PURPOSES ONLY.  IF CONFIRMATION IS NEEDED FOR ANY PURPOSE, NOTIFY LAB WITHIN 5 DAYS.  LOWEST DETECTABLE LIMITS FOR URINE DRUG SCREEN Drug Class                     Cutoff (ng/mL) Amphetamine and metabolites    1000 Barbiturate and metabolites    200 Benzodiazepine                 200 Tricyclics and metabolites     300 Opiates and metabolites        300 Cocaine and metabolites        300 THC                            50 Performed at [facility], [address] [place], [place] [address]    WBC 03/14/2021 7.2  4.0 - 10.5 K/uL Final   RBC 03/14/2021 5.25  4.22 - 5.81 MIL/uL Final   Hemoglobin 03/14/2021 15.2  13.0 - 17.0 g/dL Final   HCT 05/39/7673 47.9  39.0 - 52.0 % Final   MCV 03/14/2021  91.2  80.0 - 100.0 fL Final   MCH 03/14/2021 29.0  26.0 - 34.0 pg Final   MCHC 03/14/2021 31.7  30.0 - 36.0 g/dL Final   RDW 41/93/7902 13.8  11.5 - 15.5 % Final   Platelets 03/14/2021 266  150 - 400 K/uL Final   nRBC 03/14/2021 0.0  0.0 - 0.2 % Final   Neutrophils Relative % 03/14/2021 67  % Final   Neutro Abs 03/14/2021 4.8  1.7 - 7.7 K/uL Final   Lymphocytes Relative 03/14/2021 21  % Final   Lymphs Abs 03/14/2021 1.5  0.7 - 4.0 K/uL Final   Monocytes Relative 03/14/2021 9  % Final   Monocytes Absolute 03/14/2021 0.6  0.1 - 1.0 K/uL Final   Eosinophils Relative 03/14/2021 2  % Final   Eosinophils Absolute 03/14/2021 0.1  0.0 - 0.5 K/uL Final   Basophils Relative 03/14/2021 0  % Final   Basophils Absolute 03/14/2021 0.0  0.0 - 0.1 K/uL Final   Immature Granulocytes 03/14/2021 1  % Final   Abs Immature Granulocytes 03/14/2021 0.08 (H)  0.00 - 0.07 K/uL Final   Performed at [facility], [address] [place], [place] [address]   Acetaminophen (Tylenol), Serum 03/14/2021 <10 (L)  10 - 30 ug/mL Final   Comment: (NOTE) Therapeutic concentrations vary significantly. A range of  10-30 ug/mL  may be an effective concentration for many patients. However, some  are best treated at concentrations outside of this range. Acetaminophen concentrations >150 ug/mL at 4 hours after ingestion  and >50 ug/mL at 12 hours after ingestion are often associated with  toxic reactions.  Performed at [facility], [address] [place], [place] [address]    Salicylate Lvl 03/14/2021 <7.0 (L)  7.0 - 30.0 mg/dL Final   Performed at [facility], [address] [place] [address] [place], [place] [address]  Admission on 02/12/2021, Discharged on 02/18/2021  Component Date Value Ref Range Status   POC Amphetamine UR 02/12/2021 None Detected  NONE DETECTED (Cut Off Level 1000 ng/mL) Final   POC Secobarbital (BAR) 02/12/2021 None Detected  NONE DETECTED (Cut Off Level  300 ng/mL) Final   POC Buprenorphine (BUP) 02/12/2021 None Detected  NONE DETECTED (Cut Off Level 10 ng/mL) Final   POC Oxazepam (BZO) 02/12/2021 None Detected  NONE DETECTED (Cut Off Level 300 ng/mL) Final   POC Cocaine UR 02/12/2021 None Detected  NONE DETECTED (Cut Off Level 300 ng/mL) Final   POC Methamphetamine UR 02/12/2021 None Detected  NONE DETECTED (Cut Off Level 1000 ng/mL) Final   POC Morphine 02/12/2021 Positive (A)  NONE DETECTED (Cut Off Level 300 ng/mL) Final   POC Oxycodone UR 02/12/2021 None Detected  NONE DETECTED (Cut Off Level 100 ng/mL) Final   POC Methadone UR 02/12/2021 None Detected  NONE DETECTED (Cut Off Level 300 ng/mL) Final   POC Marijuana UR 02/12/2021 Positive (A)  NONE DETECTED (Cut Off Level 50 ng/mL) Final  Admission on 02/12/2021, Discharged on 02/12/2021  Component Date Value Ref Range Status   SARS Coronavirus 2 by RT PCR 02/12/2021 NEGATIVE  NEGATIVE Final   Comment: (NOTE) SARS-CoV-2 target nucleic acids are NOT DETECTED.  The SARS-CoV-2 RNA is generally detectable in upper respiratory specimens during the acute phase of infection. The lowest concentration of SARS-CoV-2 viral copies this assay can detect is 138 copies/mL. A negative result does not preclude SARS-Cov-2 infection and should not be used as the sole basis for treatment or other patient management decisions. A negative result may occur with  improper specimen collection/handling, submission of specimen other than nasopharyngeal swab, presence of viral mutation(s) within the areas targeted by this assay, and inadequate number of viral copies(<138 copies/mL). A negative result must be combined with clinical observations, patient history, and epidemiological information. The expected result is Negative.  Fact Sheet for Patients:  BloggerCourse.comhttps://www.fda.gov/media/152166/download  Fact Sheet for Healthcare Providers:  SeriousBroker.ithttps://www.fda.gov/media/152162/download  This test is no                           t yet approved or cleared by the [place] FDA and  has been authorized for detection and/or diagnosis of SARS-CoV-2 by FDA under an Emergency Use Authorization (EUA). This EUA will remain  in effect (meaning this test can be used) for the duration of the COVID-19 declaration under Section 564(b)(1) of the Act, 21 U.S.C.section 360bbb-3(b)(1), unless the authorization is terminated  or revoked sooner.       Influenza A by PCR 02/12/2021 NEGATIVE  NEGATIVE Final   Influenza B by PCR 02/12/2021 NEGATIVE  NEGATIVE Final   Comment: (NOTE) The Xpert Xpress SARS-CoV-2/FLU/RSV plus assay is intended as an aid in the diagnosis of influenza from Nasopharyngeal swab specimens and should not be used as a sole basis for treatment. Nasal washings and aspirates  are unacceptable for Xpert Xpress SARS-CoV-2/FLU/RSV testing.  Fact Sheet for Patients: EntrepreneurPulse.com.au  Fact Sheet for Healthcare Providers: IncredibleEmployment.be  This test is not yet approved or cleared by the [place] FDA and has been authorized for detection and/or diagnosis of SARS-CoV-2 by FDA under an Emergency Use Authorization (EUA). This EUA will remain in effect (meaning this test can be used) for the duration of the COVID-19 declaration under Section 564(b)(1) of the Act, 21 U.S.C. section 360bbb-3(b)(1), unless the authorization is terminated or revoked.  Performed at [facility], [place] [address] [place], [place] [address]    Sodium 02/12/2021 135  135 - 145 mmol/L Final   Potassium 02/12/2021 4.1  3.5 - 5.1 mmol/L Final   Chloride 02/12/2021 102  98 - 111 mmol/L Final   CO2 02/12/2021 28  22 - 32 mmol/L Final   Glucose, Bld 02/12/2021 92  70 - 99 mg/dL Final   Glucose reference range applies only to samples taken after fasting for at least 8 hours.   BUN 02/12/2021 13  6 - 20 mg/dL Final   Creatinine, Ser 02/12/2021 0.56 (L)  0.61 -  1.24 mg/dL Final   Calcium 02/12/2021 8.9  8.9 - 10.3 mg/dL Final   Total Protein 02/12/2021 6.7  6.5 - 8.1 g/dL Final   Albumin 02/12/2021 3.9  3.5 - 5.0 g/dL Final   AST 02/12/2021 19  15 - 41 U/L Final   ALT 02/12/2021 15  0 - 44 U/L Final   Alkaline Phosphatase 02/12/2021 75  38 - 126 U/L Final   Total Bilirubin 02/12/2021 0.6  0.3 - 1.2 mg/dL Final   GFR, Estimated 02/12/2021 >60  >60 mL/min Final   Comment: (NOTE) Calculated using the CKD-EPI Creatinine Equation (2021)    Anion gap 02/12/2021 5  5 - 15 Final   Performed at [facility], [place] [address], [place], [place] [address]   Alcohol, Ethyl (B) 02/12/2021 <10  <10 mg/dL Final   Comment: (NOTE) Lowest detectable limit for serum alcohol is 10 mg/dL.  For medical purposes only. Performed at [facility], [place] [address], [place], [place] [address]    WBC 02/12/2021 7.1  4.0 - 10.5 K/uL Final   RBC 02/12/2021 4.68  4.22 - 5.81 MIL/uL Final   Hemoglobin 02/12/2021 13.4  13.0 - 17.0 g/dL Final   HCT 02/12/2021 40.7  39.0 - 52.0 % Final   MCV 02/12/2021 87.0  80.0 - 100.0 fL Final   MCH 02/12/2021 28.6  26.0 - 34.0 pg Final   MCHC 02/12/2021 32.9  30.0 - 36.0 g/dL Final   RDW 02/12/2021 13.0  11.5 - 15.5 % Final   Platelets 02/12/2021 263  150 - 400 K/uL Final   nRBC 02/12/2021 0.0  0.0 - 0.2 % Final   Neutrophils Relative % 02/12/2021 73  % Final   Neutro Abs 02/12/2021 5.2  1.7 - 7.7 K/uL Final   Lymphocytes Relative 02/12/2021 18  % Final   Lymphs Abs 02/12/2021 1.3  0.7 - 4.0 K/uL Final   Monocytes Relative 02/12/2021 5  % Final   Monocytes Absolute 02/12/2021 0.4  0.1 - 1.0 K/uL Final   Eosinophils Relative 02/12/2021 3  % Final   Eosinophils Absolute 02/12/2021 0.2  0.0 - 0.5 K/uL Final   Basophils Relative 02/12/2021 1  % Final   Basophils Absolute 02/12/2021 0.1  0.0 - 0.1 K/uL Final   Immature Granulocytes 02/12/2021 0  % Final   Abs  Immature Granulocytes 02/12/2021 0.03   0.00 - 0.07 K/uL Final   Performed at [facility] [address] [place], [place] [id number]   Salicylate Lvl 9[address] <7.0 (L)  7.0 - 30.0 mg/dL Final   Performed at [facility] [address] [place], [place] [address]   Acetaminophen (Tylenol), Serum 02/12/2021 <10 (L)  10 - 30 ug/mL Final   Comment: (NOTE) Therapeutic concentrations vary significantly. A range of 10-30 ug/mL  may be an effective concentration for many patients. However, some  are best treated at concentrations outside of this range. Acetaminophen concentrations >150 ug/mL at 4 hours after ingestion  and >50 ug/mL at 12 hours after ingestion are often associated with  toxic reactions.  Performed at [facility], [place] [address], [place], [place] [address]   Admission on 02/08/2021, Discharged on 02/10/2021  Component Date Value Ref Range Status   SARS Coronavirus 2 by RT PCR 02/08/2021 NEGATIVE  NEGATIVE Final   Comment: (NOTE) SARS-CoV-2 target nucleic acids are NOT DETECTED.  The SARS-CoV-2 RNA is generally detectable in upper respiratory specimens during the acute phase of infection. The lowest concentration of SARS-CoV-2 viral copies this assay can detect is 138 copies/mL. A negative result does not preclude SARS-Cov-2 infection and should not be used as the sole basis for treatment or other patient management decisions. A negative result may occur with  improper specimen collection/handling, submission of specimen other than nasopharyngeal swab, presence of viral mutation(s) within the areas targeted by this assay, and inadequate number of viral copies(<138 copies/mL). A negative result must be combined with clinical observations, patient history, and epidemiological information. The expected result is Negative.  Fact Sheet for Patients:  EntrepreneurPulse.com.au  Fact Sheet for Healthcare Providers:   IncredibleEmployment.be  This test is no                          t yet approved or cleared by the [place] FDA and  has been authorized for detection and/or diagnosis of SARS-CoV-2 by FDA under an Emergency Use Authorization (EUA). This EUA will remain  in effect (meaning this test can be used) for the duration of the COVID-19 declaration under Section 564(b)(1) of the Act, 21 U.S.C.section 360bbb-3(b)(1), unless the authorization is terminated  or revoked sooner.       Influenza A by PCR 02/08/2021 NEGATIVE  NEGATIVE Final   Influenza B by PCR 02/08/2021 NEGATIVE  NEGATIVE Final   Comment: (NOTE) The Xpert Xpress SARS-CoV-2/FLU/RSV plus assay is intended as an aid in the diagnosis of influenza from Nasopharyngeal swab specimens and should not be used as a sole basis for treatment. Nasal washings and aspirates are unacceptable for Xpert Xpress SARS-CoV-2/FLU/RSV testing.  Fact Sheet for Patients: EntrepreneurPulse.com.au  Fact Sheet for Healthcare Providers: IncredibleEmployment.be  This test is not yet approved or cleared by the [place] FDA and has been authorized for detection and/or diagnosis of SARS-CoV-2 by FDA under an Emergency Use Authorization (EUA). This EUA will remain in effect (meaning this test can be used) for the duration of the COVID-19 declaration under Section 564(b)(1) of the Act, 21 U.S.C. section 360bbb-3(b)(1), unless the authorization is terminated or revoked.  Performed at [facility], [place] [address] [place], [place] [address]    WBC 02/08/2021 6.5  4.0 - 10.5 K/uL Final   RBC 02/08/2021 5.15  4.22 - 5.81 MIL/uL Final   Hemoglobin 02/08/2021 14.9  13.0 -  17.0 g/dL Final   HCT 02/08/2021 44.9  39.0 - 52.0 % Final   MCV 02/08/2021 87.2  80.0 - 100.0 fL Final   MCH 02/08/2021 28.9  26.0 - 34.0 pg Final   MCHC 02/08/2021 33.2  30.0 - 36.0 g/dL Final   RDW 02/08/2021 13.0  11.5  - 15.5 % Final   Platelets 02/08/2021 330  150 - 400 K/uL Final   nRBC 02/08/2021 0.0  0.0 - 0.2 % Final   Neutrophils Relative % 02/08/2021 69  % Final   Neutro Abs 02/08/2021 4.5  1.7 - 7.7 K/uL Final   Lymphocytes Relative 02/08/2021 20  % Final   Lymphs Abs 02/08/2021 1.3  0.7 - 4.0 K/uL Final   Monocytes Relative 02/08/2021 7  % Final   Monocytes Absolute 02/08/2021 0.4  0.1 - 1.0 K/uL Final   Eosinophils Relative 02/08/2021 3  % Final   Eosinophils Absolute 02/08/2021 0.2  0.0 - 0.5 K/uL Final   Basophils Relative 02/08/2021 0  % Final   Basophils Absolute 02/08/2021 0.0  0.0 - 0.1 K/uL Final   Immature Granulocytes 02/08/2021 1  % Final   Abs Immature Granulocytes 02/08/2021 0.05  0.00 - 0.07 K/uL Final   Performed at [facility], [place] [address] [place], [place] [address]   Sodium 02/08/2021 137  135 - 145 mmol/L Final   Potassium 02/08/2021 4.4  3.5 - 5.1 mmol/L Final   Chloride 02/08/2021 101  98 - 111 mmol/L Final   CO2 02/08/2021 26  22 - 32 mmol/L Final   Glucose, Bld 02/08/2021 75  70 - 99 mg/dL Final   Glucose reference range applies only to samples taken after fasting for at least 8 hours.   BUN 02/08/2021 12  6 - 20 mg/dL Final   Creatinine, Ser 02/08/2021 0.83  0.61 - 1.24 mg/dL Final   Calcium 02/08/2021 9.9  8.9 - 10.3 mg/dL Final   Total Protein 02/08/2021 6.9  6.5 - 8.1 g/dL Final   Albumin 02/08/2021 4.0  3.5 - 5.0 g/dL Final   AST 02/08/2021 19  15 - 41 U/L Final   ALT 02/08/2021 19  0 - 44 U/L Final   Alkaline Phosphatase 02/08/2021 87  38 - 126 U/L Final   Total Bilirubin 02/08/2021 0.4  0.3 - 1.2 mg/dL Final   GFR, Estimated 02/08/2021 >60  >60 mL/min Final   Comment: (NOTE) Calculated using the CKD-EPI Creatinine Equation (2021)    Anion gap 02/08/2021 10  5 - 15 Final   Performed at [facility] [address] [place], [place] [address]   Alcohol, Ethyl (B) 02/08/2021 <10  <10 mg/dL Final   Comment: (NOTE) Lowest detectable limit for  serum alcohol is 10 mg/dL.  For medical purposes only. Performed at [facility], [place] [address] [place], [place] [address]    POC Amphetamine UR 02/08/2021 None Detected  NONE DETECTED (Cut Off Level 1000 ng/mL) Final   POC Secobarbital (BAR) 02/08/2021 None Detected  NONE DETECTED (Cut Off Level 300 ng/mL) Final   POC Buprenorphine (BUP) 02/08/2021 None Detected  NONE DETECTED (Cut Off Level 10 ng/mL) Final   POC Oxazepam (BZO) 02/08/2021 None Detected  NONE DETECTED (Cut Off Level 300 ng/mL) Final   POC Cocaine UR 02/08/2021 None Detected  NONE DETECTED (Cut Off Level 300 ng/mL) Final   POC Methamphetamine UR 02/08/2021 None Detected  NONE DETECTED (Cut Off Level 1000 ng/mL) Final   POC Morphine 02/08/2021 None  Detected  NONE DETECTED (Cut Off Level 300 ng/mL) Final   POC Oxycodone UR 02/08/2021 None Detected  NONE DETECTED (Cut Off Level 100 ng/mL) Final   POC Methadone UR 02/08/2021 None Detected  NONE DETECTED (Cut Off Level 300 ng/mL) Final   POC Marijuana UR 02/08/2021 None Detected  NONE DETECTED (Cut Off Level 50 ng/mL) Final   SARSCOV2ONAVIRUS 2 AG 02/08/2021 NEGATIVE  NEGATIVE Final   Comment: (NOTE) SARS-CoV-2 antigen NOT DETECTED.   Negative results are presumptive.  Negative results do not preclude SARS-CoV-2 infection and should not be used as the sole basis for treatment or other patient management decisions, including infection  control decisions, particularly in the presence of clinical signs and  symptoms consistent with COVID-19, or in those who have been in contact with the virus.  Negative results must be combined with clinical observations, patient history, and epidemiological information. The expected result is Negative.  Fact Sheet for Patients: HandmadeRecipes.com.cy  Fact Sheet for Healthcare Providers: FuneralLife.at  This test is not yet approved or cleared by the [place] FDA and  has been  authorized for detection and/or diagnosis of SARS-CoV-2 by FDA under an Emergency Use Authorization (EUA).  This EUA will remain in effect (meaning this test can be used) for the duration of  the COV                          ID-19 declaration under Section 564(b)(1) of the Act, 21 U.S.C. section 360bbb-3(b)(1), unless the authorization is terminated or revoked sooner.     SARS Coronavirus 2 Ag 02/08/2021 Negative  Negative Final   SARSCOV2ONAVIRUS 2 AG 02/08/2021 NEGATIVE  NEGATIVE Final   Comment: (NOTE) SARS-CoV-2 antigen NOT DETECTED.   Negative results are presumptive.  Negative results do not preclude SARS-CoV-2 infection and should not be used as the sole basis for treatment or other patient management decisions, including infection  control decisions, particularly in the presence of clinical signs and  symptoms consistent with COVID-19, or in those who have been in contact with the virus.  Negative results must be combined with clinical observations, patient history, and epidemiological information. The expected result is Negative.  Fact Sheet for Patients: HandmadeRecipes.com.cy  Fact Sheet for Healthcare Providers: FuneralLife.at  This test is not yet approved or cleared by the [place] FDA and  has been authorized for detection and/or diagnosis of SARS-CoV-2 by FDA under an Emergency Use Authorization (EUA).  This EUA will remain in effect (meaning this test can be used) for the duration of  the COV                          ID-19 declaration under Section 564(b)(1) of the Act, 21 U.S.C. section 360bbb-3(b)(1), unless the authorization is terminated or revoked sooner.      Allergies: Patient has no known allergies.  PTA Medications: (Not in a hospital admission)   Medical Decision Making  Jared Jordan is a 30 y/o male presenting to [facility] for suicidal ideations after leaving [facility] inpatient treatment.  Patient has relapsed on substances and will be admitted to [facility].    Recommendations  Based on my evaluation the patient does not appear to have an emergency medical condition. Patient will be admitted to [facility]. For safety and crisis stabilization.  Lucia Bitter, NP 06/14/21  6:38 AM
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
## 2021-06-14 NOTE — ED Notes (Signed)
Pt sleeping in no acute distress. RR even and unlabored. Safety maintained. 
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
## 2021-06-14 NOTE — ED Notes (Signed)
Pt taking shower in no acute distress. Denies needs or concerns at present. Denies SI/HI/AVH. Will continue to monitor for safety.
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
## 2021-06-14 NOTE — BH Assessment (Addendum)
Per [facility], RN, pt has been accepted to [facility] and can arrive at any time.  Room: 153 Accepting: [name], NP Attending: Dr. Kerin Perna to Report: [phone]  This information was relayed to pt's team at 0438.

## 2021-06-14 NOTE — ED Notes (Signed)
Pt denies SI/HI/AVH this am. Calm, cooperative with staff. Received am medication without difficulty. Informed pt to notify staff with any needs or concerns. Safety maintained.

## 2021-06-15 DIAGNOSIS — F332 Major depressive disorder, recurrent severe without psychotic features: Secondary | ICD-10-CM | POA: Diagnosis not present

## 2021-06-15 DIAGNOSIS — R45851 Suicidal ideations: Secondary | ICD-10-CM | POA: Diagnosis not present

## 2021-06-15 DIAGNOSIS — F1190 Opioid use, unspecified, uncomplicated: Secondary | ICD-10-CM | POA: Diagnosis not present

## 2021-06-15 NOTE — ED Notes (Signed)
Pt sleeping in no acute distress. RR even and unlabored. Safety maintained. 

## 2021-06-15 NOTE — ED Notes (Signed)
Pt is in the bed sleeping. Respirations are even and unlabored. No acute distress noted. Will continue to monitor for safety. 

## 2021-06-15 NOTE — ED Provider Notes (Signed)
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 Serum 03/19/2021 <10 (L)  10 - 30 ug/mL Final   Comment: (NOTE) Therapeutic concentrations vary significantly. A range of 10-30 ug/mL  may be an effective concentration for many patients. However, some  are best treated at concentrations outside of this range. Acetaminophen concentrations >150 ug/mL at 4 hours after ingestion  and >50 ug/mL at 12 hours after  ingestion are often associated with  toxic reactions.  Performed at [facility], [place] [address] [place], [place] [address]    WBC 03/19/2021 7.3  4.0 - 10.5 K/uL Final   RBC 03/19/2021 4.32  4.22 - 5.81 MIL/uL Final   Hemoglobin 03/19/2021 12.4 (L)  13.0 - 17.0 g/dL Final   HCT 03/19/2021 38.6 (L)  39.0 - 52.0 % Final   MCV 03/19/2021 89.4  80.0 - 100.0 fL Final   MCH 03/19/2021 28.7  26.0 - 34.0 pg Final   MCHC 03/19/2021 32.1  30.0 - 36.0 g/dL Final   RDW 03/19/2021 13.4  11.5 - 15.5 % Final   Platelets 03/19/2021 220  150 - 400 K/uL Final   nRBC 03/19/2021 0.0  0.0 - 0.2 % Final   Performed at [facility] [address], [place], [place] [address]   Glucose-Capillary 03/19/2021 95  70 - 99 mg/dL Final   Glucose reference range applies only to samples taken after fasting for at least 8 hours.  Admission on 03/14/2021, Discharged on 03/14/2021  Component Date Value Ref Range Status   SARS Coronavirus 2 by RT PCR 03/14/2021 NEGATIVE  NEGATIVE Final   Comment: (NOTE) SARS-CoV-2 target nucleic acids are NOT DETECTED.  The SARS-CoV-2 RNA is generally detectable in upper respiratory specimens during the acute phase of infection. The lowest concentration of SARS-CoV-2 viral copies this assay can detect is 138 copies/mL. A negative result does not preclude SARS-Cov-2 infection and should not be used as the sole basis for treatment or other patient management decisions. A negative result may occur with  improper specimen collection/handling, submission of specimen other than nasopharyngeal swab, presence of viral mutation(s) within the areas targeted by this assay, and inadequate number of viral copies(<138 copies/mL). A negative result must be combined with clinical observations, patient history, and epidemiological information. The expected result is Negative.  Fact Sheet for Patients:  EntrepreneurPulse.com.au  Fact Sheet for Healthcare Providers:   IncredibleEmployment.be  This test is no  t yet approved or cleared by the [place] and  has been authorized for detection and/or diagnosis of SARS-CoV-2 by FDA under an Emergency Use Authorization (EUA). This EUA will remain  in effect (meaning this test can be used) for the duration of the COVID-19 declaration under Section 564(b)(1) of the Act, 21 U.S.C.section 360bbb-3(b)(1), unless the authorization is terminated  or revoked sooner.       Influenza A by PCR 03/14/2021 NEGATIVE  NEGATIVE Final   Influenza B by PCR 03/14/2021 NEGATIVE  NEGATIVE Final   Comment: (NOTE) The Xpert Xpress SARS-CoV-2/FLU/RSV plus assay is intended as an aid in the diagnosis of influenza from Nasopharyngeal swab specimens and should not be used as a sole basis for treatment. Nasal washings and aspirates are unacceptable for Xpert Xpress SARS-CoV-2/FLU/RSV testing.  Fact Sheet for Patients: EntrepreneurPulse.com.au  Fact Sheet for Healthcare Providers: IncredibleEmployment.be  This test is not yet approved or cleared by the [place] FDA and has been authorized for detection and/or diagnosis of SARS-CoV-2 by FDA under an Emergency Use Authorization (EUA). This EUA will remain in effect (meaning this test can be used) for the duration of the COVID-19 declaration under Section 564(b)(1) of the Act, 21 U.S.C. section 360bbb-3(b)(1), unless the authorization is terminated or revoked.  Performed at [facility], [place] [address], [place], [place] [address]    Sodium 03/14/2021 139  135 - 145 mmol/L Final   Potassium 03/14/2021 4.5  3.5 - 5.1 mmol/L Final   Chloride 03/14/2021 104  98 - 111 mmol/L Final   CO2 03/14/2021 24  22 - 32 mmol/L Final   Glucose, Bld 03/14/2021 77  70 - 99 mg/dL Final   Glucose reference range applies only to samples taken after fasting for at least 8 hours.   BUN  03/14/2021 13  6 - 20 mg/dL Final   Creatinine, Ser 03/14/2021 0.70  0.61 - 1.24 mg/dL Final   Calcium 03/14/2021 9.8  8.9 - 10.3 mg/dL Final   Total Protein 03/14/2021 7.6  6.5 - 8.1 g/dL Final   Albumin 03/14/2021 4.4  3.5 - 5.0 g/dL Final   AST 03/14/2021 54 (H)  15 - 41 U/L Final   ALT 03/14/2021 66 (H)  0 - 44 U/L Final   Alkaline Phosphatase 03/14/2021 78  38 - 126 U/L Final   Total Bilirubin 03/14/2021 0.4  0.3 - 1.2 mg/dL Final   GFR, Estimated 03/14/2021 >60  >60 mL/min Final   Comment: (NOTE) Calculated using the CKD-EPI Creatinine Equation (2021)    Anion gap 03/14/2021 11  5 - 15 Final   Performed at [facility], [place] [address] [place], [place] [address]   Alcohol, Ethyl (B) 03/14/2021 <10  <10 mg/dL Final   Comment: (NOTE) Lowest detectable limit for serum alcohol is 10 mg/dL.  For medical purposes only. Performed at [facility], [place] [address] [place], [place] [address]    Opiates 03/14/2021 NONE DETECTED  NONE DETECTED Final   Cocaine 03/14/2021 NONE DETECTED  NONE DETECTED Final   Benzodiazepines 03/14/2021 NONE DETECTED  NONE DETECTED Final   Amphetamines 03/14/2021 NONE DETECTED  NONE DETECTED Final   Tetrahydrocannabinol 03/14/2021 NONE DETECTED  NONE DETECTED Final   Barbiturates 03/14/2021 NONE DETECTED  NONE DETECTED Final   Comment: (NOTE) DRUG SCREEN FOR MEDICAL PURPOSES ONLY.  IF CONFIRMATION IS NEEDED FOR ANY PURPOSE, NOTIFY LAB WITHIN 5 DAYS.  LOWEST DETECTABLE LIMITS FOR URINE DRUG SCREEN Drug Class  Cutoff (ng/mL) Amphetamine and metabolites    1000 Barbiturate and metabolites    200 Benzodiazepine                 [id number] Tricyclics and metabolites     300 Opiates and metabolites        300 Cocaine and metabolites        300 THC                            50 Performed at [facility], [place] [address], [place], [place] [address]    WBC 03/14/2021 7.2  4.0 - 10.5 K/uL  Final   RBC 03/14/2021 5.25  4.22 - 5.81 MIL/uL Final   Hemoglobin 03/14/2021 15.2  13.0 - 17.0 g/dL Final   HCT 03/14/2021 47.9  39.0 - 52.0 % Final   MCV 03/14/2021 91.2  80.0 - 100.0 fL Final   MCH 03/14/2021 29.0  26.0 - 34.0 pg Final   MCHC 03/14/2021 31.7  30.0 - 36.0 g/dL Final   RDW 03/14/2021 13.8  11.5 - 15.5 % Final   Platelets 03/14/2021 266  150 - 400 K/uL Final   nRBC 03/14/2021 0.0  0.0 - 0.2 % Final   Neutrophils Relative % 03/14/2021 67  % Final   Neutro Abs 03/14/2021 4.8  1.7 - 7.7 K/uL Final   Lymphocytes Relative 03/14/2021 21  % Final   Lymphs Abs 03/14/2021 1.5  0.7 - 4.0 K/uL Final   Monocytes Relative 03/14/2021 9  % Final   Monocytes Absolute 03/14/2021 0.6  0.1 - 1.0 K/uL Final   Eosinophils Relative 03/14/2021 2  % Final   Eosinophils Absolute 03/14/2021 0.1  0.0 - 0.5 K/uL Final   Basophils Relative 03/14/2021 0  % Final   Basophils Absolute 03/14/2021 0.0  0.0 - 0.1 K/uL Final   Immature Granulocytes 03/14/2021 1  % Final   Abs Immature Granulocytes 03/14/2021 0.08 (H)  0.00 - 0.07 K/uL Final   Performed at [facility], [place] [address] [place], [place] [address]   Acetaminophen (Tylenol), Serum 03/14/2021 <10 (L)  10 - 30 ug/mL Final   Comment: (NOTE) Therapeutic concentrations vary significantly. A range of 10-30 ug/mL  may be an effective concentration for many patients. However, some  are best treated at concentrations outside of this range. Acetaminophen concentrations >150 ug/mL at 4 hours after ingestion  and >50 ug/mL at 12 hours after ingestion are often associated with  toxic reactions.  Performed at [facility], [place] [address], [place], [place] [id number]    Salicylate Lvl [id number] <7.0 (L)  7.0 - 30.0 mg/dL Final   Performed at [facility] [address] [place], [place] [address]  Admission on 02/12/2021, Discharged on 02/18/2021  Component Date Value Ref Range Status   POC  Amphetamine UR 02/12/2021 None Detected  NONE DETECTED (Cut Off Level 1000 ng/mL) Final   POC Secobarbital (BAR) 02/12/2021 None Detected  NONE DETECTED (Cut Off Level 300 ng/mL) Final   POC Buprenorphine (BUP) 02/12/2021 None Detected  NONE DETECTED (Cut Off Level 10 ng/mL) Final   POC Oxazepam (BZO) 02/12/2021 None Detected  NONE DETECTED (Cut Off Level 300 ng/mL) Final   POC Cocaine UR 02/12/2021 None Detected  NONE DETECTED (Cut Off Level 300 ng/mL) Final   POC Methamphetamine UR 02/12/2021 None Detected  NONE DETECTED (Cut Off Level 1000 ng/mL) Final   POC Morphine 02/12/2021  Positive (A)  NONE DETECTED (Cut Off Level 300 ng/mL) Final   POC Oxycodone UR 02/12/2021 None Detected  NONE DETECTED (Cut Off Level 100 ng/mL) Final   POC Methadone UR 02/12/2021 None Detected  NONE DETECTED (Cut Off Level 300 ng/mL) Final   POC Marijuana UR 02/12/2021 Positive (A)  NONE DETECTED (Cut Off Level 50 ng/mL) Final  Admission on 02/12/2021, Discharged on 02/12/2021  Component Date Value Ref Range Status   SARS Coronavirus 2 by RT PCR 02/12/2021 NEGATIVE  NEGATIVE Final   Comment: (NOTE) SARS-CoV-2 target nucleic acids are NOT DETECTED.  The SARS-CoV-2 RNA is generally detectable in upper respiratory specimens during the acute phase of infection. The lowest concentration of SARS-CoV-2 viral copies this assay can detect is 138 copies/mL. A negative result does not preclude SARS-Cov-2 infection and should not be used as the sole basis for treatment or other patient management decisions. A negative result may occur with  improper specimen collection/handling, submission of specimen other than nasopharyngeal swab, presence of viral mutation(s) within the areas targeted by this assay, and inadequate number of viral copies(<138 copies/mL). A negative result must be combined with clinical observations, patient history, and epidemiological information. The expected result is Negative.  Fact Sheet for  Patients:  EntrepreneurPulse.com.au  Fact Sheet for Healthcare Providers:  IncredibleEmployment.be  This test is no                          t yet approved or cleared by the [place] FDA and  has been authorized for detection and/or diagnosis of SARS-CoV-2 by FDA under an Emergency Use Authorization (EUA). This EUA will remain  in effect (meaning this test can be used) for the duration of the COVID-19 declaration under Section 564(b)(1) of the Act, 21 U.S.C.section 360bbb-3(b)(1), unless the authorization is terminated  or revoked sooner.       Influenza A by PCR 02/12/2021 NEGATIVE  NEGATIVE Final   Influenza B by PCR 02/12/2021 NEGATIVE  NEGATIVE Final   Comment: (NOTE) The Xpert Xpress SARS-CoV-2/FLU/RSV plus assay is intended as an aid in the diagnosis of influenza from Nasopharyngeal swab specimens and should not be used as a sole basis for treatment. Nasal washings and aspirates are unacceptable for Xpert Xpress SARS-CoV-2/FLU/RSV testing.  Fact Sheet for Patients: EntrepreneurPulse.com.au  Fact Sheet for Healthcare Providers: IncredibleEmployment.be  This test is not yet approved or cleared by the [place] FDA and has been authorized for detection and/or diagnosis of SARS-CoV-2 by FDA under an Emergency Use Authorization (EUA). This EUA will remain in effect (meaning this test can be used) for the duration of the COVID-19 declaration under Section 564(b)(1) of the Act, 21 U.S.C. section 360bbb-3(b)(1), unless the authorization is terminated or revoked.  Performed at [facility], [place] [address], [place], [place] [address]    Sodium 02/12/2021 135  135 - 145 mmol/L Final   Potassium 02/12/2021 4.1  3.5 - 5.1 mmol/L Final   Chloride 02/12/2021 102  98 - 111 mmol/L Final   CO2 02/12/2021 28  22 - 32 mmol/L Final   Glucose, Bld 02/12/2021 92  70 - 99 mg/dL Final    Glucose reference range applies only to samples taken after fasting for at least 8 hours.   BUN 02/12/2021 13  6 - 20 mg/dL Final   Creatinine, Ser 02/12/2021 0.56 (L)  0.61 - 1.24 mg/dL Final   Calcium 02/12/2021 8.9  8.9 - 10.3 mg/dL Final  Total Protein 02/12/2021 6.7  6.5 - 8.1 g/dL Final   Albumin 02/12/2021 3.9  3.5 - 5.0 g/dL Final   AST 02/12/2021 19  15 - 41 U/L Final   ALT 02/12/2021 15  0 - 44 U/L Final   Alkaline Phosphatase 02/12/2021 75  38 - 126 U/L Final   Total Bilirubin 02/12/2021 0.6  0.3 - 1.2 mg/dL Final   GFR, Estimated 02/12/2021 >60  >60 mL/min Final   Comment: (NOTE) Calculated using the CKD-EPI Creatinine Equation (2021)    Anion gap 02/12/2021 5  5 - 15 Final   Performed at [facility], [place] [address] [place], [place] [address]   Alcohol, Ethyl (B) 02/12/2021 <10  <10 mg/dL Final   Comment: (NOTE) Lowest detectable limit for serum alcohol is 10 mg/dL.  For medical purposes only. Performed at [facility], [place] [address] [place], [place] [address]    WBC 02/12/2021 7.1  4.0 - 10.5 K/uL Final   RBC 02/12/2021 4.68  4.22 - 5.81 MIL/uL Final   Hemoglobin 02/12/2021 13.4  13.0 - 17.0 g/dL Final   HCT 02/12/2021 40.7  39.0 - 52.0 % Final   MCV 02/12/2021 87.0  80.0 - 100.0 fL Final   MCH 02/12/2021 28.6  26.0 - 34.0 pg Final   MCHC 02/12/2021 32.9  30.0 - 36.0 g/dL Final   RDW 02/12/2021 13.0  11.5 - 15.5 % Final   Platelets 02/12/2021 263  150 - 400 K/uL Final   nRBC 02/12/2021 0.0  0.0 - 0.2 % Final   Neutrophils Relative % 02/12/2021 73  % Final   Neutro Abs 02/12/2021 5.2  1.7 - 7.7 K/uL Final   Lymphocytes Relative 02/12/2021 18  % Final   Lymphs Abs 02/12/2021 1.3  0.7 - 4.0 K/uL Final   Monocytes Relative 02/12/2021 5  % Final   Monocytes Absolute 02/12/2021 0.4  0.1 - 1.0 K/uL Final   Eosinophils Relative 02/12/2021 3  % Final   Eosinophils Absolute 02/12/2021 0.2  0.0 - 0.5 K/uL Final   Basophils  Relative 02/12/2021 1  % Final   Basophils Absolute 02/12/2021 0.1  0.0 - 0.1 K/uL Final   Immature Granulocytes 02/12/2021 0  % Final   Abs Immature Granulocytes 02/12/2021 0.03  0.00 - 0.07 K/uL Final   Performed at [facility], [place] [address] [place], [place] [id number]   Salicylate Lvl 99991111 <7.0 (L)  7.0 - 30.0 mg/dL Final   Performed at [place] [address] [place], [place] [address]   Acetaminophen (Tylenol), Serum 02/12/2021 <10 (L)  10 - 30 ug/mL Final   Comment: (NOTE) Therapeutic concentrations vary significantly. A range of 10-30 ug/mL  may be an effective concentration for many patients. However, some  are best treated at concentrations outside of this range. Acetaminophen concentrations >150 ug/mL at 4 hours after ingestion  and >50 ug/mL at 12 hours after ingestion are often associated with  toxic reactions.  Performed at [facility], [place] [address], [place],  [address]   Admission on 02/08/2021, Discharged on 02/10/2021  Component Date Value Ref Range Status   SARS Coronavirus 2 by RT PCR 02/08/2021 NEGATIVE  NEGATIVE Final   Comment: (NOTE) SARS-CoV-2 target nucleic acids are NOT DETECTED.  The SARS-CoV-2 RNA is generally detectable in upper respiratory specimens during the acute phase of infection. The lowest concentration of SARS-CoV-2 viral copies this assay can detect is 138 copies/mL. A negative result  does not preclude SARS-Cov-2 infection and should not be used as the sole basis for treatment or other patient management decisions. A negative result may occur with  improper specimen collection/handling, submission of specimen other than nasopharyngeal swab, presence of viral mutation(s) within the areas targeted by this assay, and inadequate number of viral copies(<138 copies/mL). A negative result must be combined with clinical observations, patient history, and  epidemiological information. The expected result is Negative.  Fact Sheet for Patients:  EntrepreneurPulse.com.au  Fact Sheet for Healthcare Providers:  IncredibleEmployment.be  This test is no                          t yet approved or cleared by the [place] FDA and  has been authorized for detection and/or diagnosis of SARS-CoV-2 by FDA under an Emergency Use Authorization (EUA). This EUA will remain  in effect (meaning this test can be used) for the duration of the COVID-19 declaration under Section 564(b)(1) of the Act, 21 U.S.C.section 360bbb-3(b)(1), unless the authorization is terminated  or revoked sooner.       Influenza A by PCR 02/08/2021 NEGATIVE  NEGATIVE Final   Influenza B by PCR 02/08/2021 NEGATIVE  NEGATIVE Final   Comment: (NOTE) The Xpert Xpress SARS-CoV-2/FLU/RSV plus assay is intended as an aid in the diagnosis of influenza from Nasopharyngeal swab specimens and should not be used as a sole basis for treatment. Nasal washings and aspirates are unacceptable for Xpert Xpress SARS-CoV-2/FLU/RSV testing.  Fact Sheet for Patients: EntrepreneurPulse.com.au  Fact Sheet for Healthcare Providers: IncredibleEmployment.be  This test is not yet approved or cleared by the [place] FDA and has been authorized for detection and/or diagnosis of SARS-CoV-2 by FDA under an Emergency Use Authorization (EUA). This EUA will remain in effect (meaning this test can be used) for the duration of the COVID-19 declaration under Section 564(b)(1) of the Act, 21 U.S.C. section 360bbb-3(b)(1), unless the authorization is terminated or revoked.  Performed at [facility], [place] [address], [place], [place] [address]    WBC 02/08/2021 6.5  4.0 - 10.5 K/uL Final   RBC 02/08/2021 5.15  4.22 - 5.81 MIL/uL Final   Hemoglobin 02/08/2021 14.9  13.0 - 17.0 g/dL Final   HCT 02/08/2021 44.9  39.0 - 52.0  % Final   MCV 02/08/2021 87.2  80.0 - 100.0 fL Final   MCH 02/08/2021 28.9  26.0 - 34.0 pg Final   MCHC 02/08/2021 33.2  30.0 - 36.0 g/dL Final   RDW 02/08/2021 13.0  11.5 - 15.5 % Final   Platelets 02/08/2021 330  150 - 400 K/uL Final   nRBC 02/08/2021 0.0  0.0 - 0.2 % Final   Neutrophils Relative % 02/08/2021 69  % Final   Neutro Abs 02/08/2021 4.5  1.7 - 7.7 K/uL Final   Lymphocytes Relative 02/08/2021 20  % Final   Lymphs Abs 02/08/2021 1.3  0.7 - 4.0 K/uL Final   Monocytes Relative 02/08/2021 7  % Final   Monocytes Absolute 02/08/2021 0.4  0.1 - 1.0 K/uL Final   Eosinophils Relative 02/08/2021 3  % Final   Eosinophils Absolute 02/08/2021 0.2  0.0 - 0.5 K/uL Final   Basophils Relative 02/08/2021 0  % Final   Basophils Absolute 02/08/2021 0.0  0.0 - 0.1 K/uL Final   Immature Granulocytes 02/08/2021 1  % Final   Abs Immature Granulocytes 02/08/2021 0.05  0.00 - 0.07 K/uL Final   Performed at [facility], [place] [address] [place], [place] [address]   Sodium 02/08/2021 137  135 - 145 mmol/L Final   Potassium 02/08/2021 4.4  3.5 - 5.1 mmol/L Final   Chloride 02/08/2021 101  98 - 111 mmol/L Final   CO2 02/08/2021 26  22 - 32 mmol/L Final   Glucose, Bld 02/08/2021 75  70 - 99 mg/dL Final   Glucose reference range applies only to samples taken after fasting for at least 8 hours.   BUN 02/08/2021 12  6 - 20 mg/dL Final   Creatinine, Ser 02/08/2021 0.83  0.61 - 1.24 mg/dL Final   Calcium 02/08/2021 9.9  8.9 - 10.3 mg/dL Final   Total Protein 02/08/2021 6.9  6.5 - 8.1 g/dL Final   Albumin 02/08/2021 4.0  3.5 - 5.0 g/dL Final   AST 02/08/2021 19  15 - 41 U/L Final   ALT 02/08/2021 19  0 - 44 U/L Final   Alkaline Phosphatase 02/08/2021 87  38 - 126 U/L Final   Total Bilirubin 02/08/2021 0.4  0.3 - 1.2 mg/dL Final   GFR, Estimated 02/08/2021 >60  >60 mL/min Final   Comment: (NOTE) Calculated using the CKD-EPI Creatinine Equation (2021)    Anion gap 02/08/2021 10  5 - 15 Final    Performed at [facility] [address] [place], [place] [address]   Alcohol, Ethyl (B) 02/08/2021 <10  <10 mg/dL Final   Comment: (NOTE) Lowest detectable limit for serum alcohol is 10 mg/dL.  For medical purposes only. Performed at [facility], [place] [address] [place], [place] [address]    POC Amphetamine UR 02/08/2021 None Detected  NONE DETECTED (Cut Off Level 1000 ng/mL) Final   POC Secobarbital (BAR) 02/08/2021 None Detected  NONE DETECTED (Cut Off Level 300 ng/mL) Final   POC Buprenorphine (BUP) 02/08/2021 None Detected  NONE DETECTED (Cut Off Level 10 ng/mL) Final   POC Oxazepam (BZO) 02/08/2021 None Detected  NONE DETECTED (Cut Off Level 300 ng/mL) Final   POC Cocaine UR 02/08/2021 None Detected  NONE DETECTED (Cut Off Level 300 ng/mL) Final   POC Methamphetamine UR 02/08/2021 None Detected  NONE DETECTED (Cut Off Level 1000 ng/mL) Final   POC Morphine 02/08/2021 None Detected  NONE DETECTED (Cut Off Level 300 ng/mL) Final   POC Oxycodone UR 02/08/2021 None Detected  NONE DETECTED (Cut Off Level 100 ng/mL) Final   POC Methadone UR 02/08/2021 None Detected  NONE DETECTED (Cut Off Level 300 ng/mL) Final   POC Marijuana UR 02/08/2021 None Detected  NONE DETECTED (Cut Off Level 50 ng/mL) Final   SARSCOV2ONAVIRUS 2 AG 02/08/2021 NEGATIVE  NEGATIVE Final   Comment: (NOTE) SARS-CoV-2 antigen NOT DETECTED.   Negative results are presumptive.  Negative results do not preclude SARS-CoV-2 infection and should not be used as the sole basis for treatment or other patient management decisions, including infection  control decisions, particularly in the presence of clinical signs and  symptoms consistent with COVID-19, or in those who have been in contact with the virus.  Negative results must be combined with clinical observations, patient history, and epidemiological information. The expected result is Negative.  Fact Sheet for Patients:  HandmadeRecipes.com.cy  Fact Sheet for Healthcare Providers: FuneralLife.at  This test is not yet approved or cleared by the [place] FDA and  has been authorized for detection and/or diagnosis of SARS-CoV-2 by FDA under an Emergency Use Authorization (EUA).  This EUA will remain in effect (meaning this test  can be used) for the duration of  the COV                          ID-19 declaration under Section 564(b)(1) of the Act, 21 U.S.C. section 360bbb-3(b)(1), unless the authorization is terminated or revoked sooner.     SARS Coronavirus 2 Ag 02/08/2021 Negative  Negative Final   SARSCOV2ONAVIRUS 2 AG 02/08/2021 NEGATIVE  NEGATIVE Final   Comment: (NOTE) SARS-CoV-2 antigen NOT DETECTED.   Negative results are presumptive.  Negative results do not preclude SARS-CoV-2 infection and should not be used as the sole basis for treatment or other patient management decisions, including infection  control decisions, particularly in the presence of clinical signs and  symptoms consistent with COVID-19, or in those who have been in contact with the virus.  Negative results must be combined with clinical observations, patient history, and epidemiological information. The expected result is Negative.  Fact Sheet for Patients: HandmadeRecipes.com.cy  Fact Sheet for Healthcare Providers: FuneralLife.at  This test is not yet approved or cleared by the [place] FDA and  has been authorized for detection and/or diagnosis of SARS-CoV-2 by FDA under an Emergency Use Authorization (EUA).  This EUA will remain in effect (meaning this test can be used) for the duration of  the COV                          ID-19 declaration under Section 564(b)(1) of the Act, 21 U.S.C. section 360bbb-3(b)(1), unless the authorization is terminated or revoked sooner.      Blood Alcohol level:  Lab Results   Component Value Date   ETH <10 06/14/2021   ETH 136 (H) [id number]    Metabolic Disorder Labs: Lab Results  Component Value Date   HGBA1C 5.4 04/29/2021   MPG 108.28 04/29/2021   MPG 105 12/03/2020   No results found for: PROLACTIN Lab Results  Component Value Date   CHOL 146 06/14/2021   TRIG 54 06/14/2021   HDL 49 06/14/2021   CHOLHDL 3.0 06/14/2021   VLDL 11 06/14/2021   LDLCALC 86 06/14/2021   LDLCALC 112 (H) 04/29/2021    Therapeutic Lab Levels: No results found for: LITHIUM No results found for: VALPROATE No components found for:  CBMZ  Physical Findings   AIMS    Flowsheet Row Admission (Discharged) from 04/28/2021 in [place] [facility] Admission (Discharged) from 11/30/2020 in [place] [facility] Admission (Discharged) from 11/21/2019 in [place] [facility]B  AIMS Total Score 0 0 0      AUDIT    Flowsheet Row Admission (Discharged) from 04/28/2021 in [place] [facility] Admission (Discharged) from 11/30/2020 in [place] [facility] Admission (Discharged) from 11/21/2019 in [place] 300B  Alcohol Use Disorder Identification Test Final Score (AUDIT) 9 7 8       GAD-7    Flowsheet Row Office Visit from 06/04/2021 in [place] Office Visit from 05/20/2021 in [place]  Total GAD-7 Score 1 0      PHQ2-9    [place] ED from 06/13/2021 in [place] Office Visit from 06/04/2021 in [place] Office Visit from 05/20/2021 in [place] ED from 04/28/2021 in [place] DEPT ED from  11/30/2020 in [facility] EMERGENCY DEPARTMENT  PHQ-2 Total Score 4 1 0 5 5  PHQ-9 Total Score 6 1 0 21 26       Flowsheet Row ED from 06/13/2021 in [facility] EMERGENCY DEPARTMENT Most recent reading at 06/14/2021 12:49 AM Ad[facility] (Dis[facility] Most recent reading at 04/28/2021 10:20 PM ED from 04/28/2021 in [facility] Most recent reading at 04/28/2021  6:41 AM  C-SSRS RISK CATEGORY High Risk High Risk High Risk        Musculoskeletal  Strength & Muscle Tone: within normal limits Gait & Station: normal Patient leans: N/A  Psychiatric Specialty Exam  Presentation  General Appearance: Appropriate for Environment  Eye Contact:Fair  Speech:Clear and Coherent  Speech Volume:Normal  Handedness:Right   Mood and Affect  Mood:Dysphoric  Affect:Congruent   Thought Process  Thought Processes:Coherent; Goal Directed  Descriptions of Associations:Intact  Orientation:Full (Time, Place and Person)  Thought Content:WDL  Diagnosis of Schizophrenia or Schizoaffective disorder in past: No    Hallucinations:Hallucinations: None  Ideas of Reference:None  Suicidal Thoughts:Suicidal Thoughts: No  Homicidal Thoughts:Homicidal Thoughts: No HI Active Intent and/or Plan: With Intent; With Plan   Sensorium  Memory:Immediate Fair; Recent Fair; Remote Fair  Judgment:Intact  Insight:Present   Executive Functions  Concentration:Fair  Attention Span:Fair  Recall:Fair  Fund of Knowledge:Fair  Language:Fair   Psychomotor Activity  Psychomotor Activity:Psychomotor Activity: Normal   Assets  Assets:Communication Skills; Desire for Improvement; Leisure Time; Physical Health   Sleep  Sleep:Sleep: Fair Number of Hours of Sleep: 8   Nutritional Assessment (For OBS and [name] admissions only) Has the patient had a weight loss or gain of 10 pounds or more in the last 3 months?: No Has the patient had a decrease in food intake/or appetite?: No Does the patient have  eating habits or behaviors that may be indicators of an eating disorder including binging or inducing vomiting?: No Has the patient recently lost weight without trying?: 0 Has the patient been eating poorly because of a decreased appetite?: 0 Malnutrition Screening Tool Score: 0    Physical Exam  Physical Exam Constitutional:      Appearance: Normal appearance.  HENT:     Head: Normocephalic and atraumatic.     Nose: Nose normal.  Eyes:     Conjunctiva/sclera: Conjunctivae normal.  Cardiovascular:     Rate and Rhythm: Normal rate.  Pulmonary:     Effort: Pulmonary effort is normal.  Musculoskeletal:        General: Normal range of motion.     Cervical back: Normal range of motion.  Neurological:     Mental Status: He is alert and oriented to person, place, and time.   Review of Systems  Constitutional: Negative.   HENT: Negative.    Eyes: Negative.   Respiratory: Negative.    Cardiovascular: Negative.   Skin: Negative.   Neurological: Negative.   Endo/Heme/Allergies: Negative.   Blood pressure 135/81, pulse 84, temperature 98.4 F (36.9 C), temperature source Oral, resp. rate 16, SpO2 98 %. There is no height or weight on file to calculate BMI.  Treatment Plan Summary: Patient admitted to the [facility] facility based crisis unit for mood stabilization and safety.  Medications:  Effexor 75 mg po daily for depression Trazodone 50 mg po nightly as needed for sleep Vistaril 25 mg po as needed every 6 hours for anxiety  Dispo-ongoing-patient is requesting substance use residential treatment.  Keylen Uzelac L, NP 06/15/2021 8:09 AM

## 2021-06-15 NOTE — ED Notes (Signed)
Pt watching tv and interacting with peers. No acute distress noted. Safety maintained.

## 2021-06-15 NOTE — ED Notes (Signed)
Pt asked for medication to help him sleep.

## 2021-06-15 NOTE — ED Notes (Signed)
Pt is currently in the dining room watching television.

## 2021-06-15 NOTE — ED Notes (Signed)
Pt asleep in bed. Respirations even and unlabored. Will continue to monitor for safety. ?

## 2021-06-15 NOTE — ED Notes (Signed)
Pt eating lunch

## 2021-06-15 NOTE — ED Notes (Signed)
Patient A&Ox4. Denies intent to harm self/others when asked. Denies A/VH. Patient denies any physical complaints when asked. No acute distress noted. Support and encouragement provided. Routine safety checks conducted according to facility protocol. Encouraged patient to notify staff if thoughts of harm toward self or others arise. Patient verbalize understanding and agreement. Will continue to monitor for safety.    

## 2021-06-16 DIAGNOSIS — R45851 Suicidal ideations: Secondary | ICD-10-CM | POA: Diagnosis not present

## 2021-06-16 DIAGNOSIS — F332 Major depressive disorder, recurrent severe without psychotic features: Secondary | ICD-10-CM | POA: Diagnosis not present

## 2021-06-16 DIAGNOSIS — F1190 Opioid use, unspecified, uncomplicated: Secondary | ICD-10-CM | POA: Diagnosis not present

## 2021-06-16 NOTE — Progress Notes (Signed)
Pt is awake, alert and oriented. Pt did not voice any complaints of pain or discomfort. No signs acute distress noted. Administered scheduled meds with no incident. Pt denies current SI/HI/AVH. Staff will monitor for pt's safety.

## 2021-06-16 NOTE — ED Notes (Signed)
Patient denies SI,HI,AVH.  Patient is cooperative and interacts well with staff and peers. Respirations are even and unlabored. No distress noted. Patient is watching TV at present. Patient stated no complaints at present. will continue to monitor for safety.

## 2021-06-16 NOTE — Progress Notes (Signed)
Pt is in the dayroom interacting with peers. No distress noted. Pt's safety is maintained.

## 2021-06-16 NOTE — ED Notes (Signed)
Pt is currently sleeping, no distress noted, environmental check complete, will continue to monitor patient for safety. ? ?

## 2021-06-16 NOTE — ED Notes (Signed)
Snacks given 

## 2021-06-16 NOTE — ED Notes (Signed)
Pt is in his room quietly, no distress noted, will continue to monitor patient for safety

## 2021-06-16 NOTE — Clinical Social Work Psych Note (Addendum)
CSW Initial Note  CSW met with Jared Jordan for introduction and to begin discussions regarding treatment and potential discharge planning.   Jared Jordan reports "feeling great". Jared Jordan reports he had a "setback" and that his recent relapse was "my fault".   Jared Jordan reports that he recently discharged from [facility] on Sunday after completing 40 days in the program. He reports he left to stay with a friend, however began to experience anxiety and panic attacks.   Jared Jordan shared that instead of utilizing new coping mechanisms, he engaged in previous self sabotaging behaviors.   Jared Jordan reports he wants to participate in residential treatment services at discharge.    CSW will continue to follow.   Radonna Ricker, MSW, LCSW Clinical Education officer, museum ([place]) [facility]

## 2021-06-16 NOTE — ED Provider Notes (Signed)
Behavioral Health Progress Note  Date and Time: 06/16/2021 5:17 PM Name: Jared Jordan MRN:  [id number]  Subjective:   30 year old male with past psychiatric history of MDD, opioid use disorder, polysubstance use who [facility] for suicidal ideation on 12/10.  Patient was transferred to [facility] for continued treatment on this date.  Patient was restarted on his home medication of Effexor 75 mg. UDS+amphetamine. Etoh neg.  Patient seen and chart reviewed.  Patient is well known to me from previous hospitalizations.  Patient found resting in his bed in no acute distress this afternoon.  Patient states that "I want to get back on track".  He denies SI/HI/AVH.  Patient states that he was at [facility] and completed 28 days at the program and then left because "I had an episode".  Patient describes becoming overwhelmed since his Christmas time and that he decided to leave early.  Patient states that he went to a "familiar territory" on [place] some hotels where he ultimately ended up using drugs.  Patient states that he had to receive Narcan which "brought me back".  Patient expressed interest in going back to substance use treatment and he states that he presented to the [facility] because he knew he could get help here.  Patient denies all physical complaints today.  Diagnosis:  Final diagnoses:  Opioid use disorder  MDD (major depressive disorder), recurrent severe, without psychosis ([place])  Suicidal ideation    Total Time spent with patient: 15 minutes  Past Psychiatric History: MDD, substance use Past Medical History:  Past Medical History:  Diagnosis Date   Anxiety    Depression    Drug use     Past Surgical History:  Procedure Laterality Date   SHOULDER SURGERY     SHOULDER SURGERY Left 2020   Family History: No family history on file. Family Psychiatric  History: denies Social History:  Social History   Substance and Sexual Activity  Alcohol Use Not  Currently   Alcohol/week: 4.0 - 6.0 standard drinks   Types: 4 - 6 Cans of beer per week   Comment: normally, 4-6 beers weekly; last drank 2 bottles of champagne yesterday     Social History   Substance and Sexual Activity  Drug Use Not Currently   Types: IV, Heroin, Marijuana, Other-see comments   Comment: using 1gm everyday heroin - for past 3 weeks    Social History   Socioeconomic History   Marital status: Single    Spouse name: Not on file   Number of children: Not on file   Years of education: Not on file   Highest education level: Not on file  Occupational History   Not on file  Tobacco Use   Smoking status: Former    Packs/day: 0.25    Years: 1.00    Pack years: 0.25    Types: Cigarettes   Smokeless tobacco: Former    Quit date: 04/15/2021  Vaping Use   Vaping Use: Some days   Start date: 11/04/2018   Devices: Jewel Menthol  Substance and Sexual Activity   Alcohol use: Not Currently    Alcohol/week: 4.0 - 6.0 standard drinks    Types: 4 - 6 Cans of beer per week    Comment: normally, 4-6 beers weekly; last drank 2 bottles of champagne yesterday   Drug use: Not Currently    Types: IV, Heroin, Marijuana, Other-see comments    Comment: using 1gm everyday heroin - for past 3 weeks  Sexual activity: Not Currently  Other Topics Concern   Not on file  Social History Narrative   Pt is grieving the death of his father last year. 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 Lives alone and counts his mom as social support. Has siblings but doesn't speak to them.   Social Determinants of Health   Financial Resource Strain: Not on file  Food Insecurity: Not on file  Transportation Needs: Not on file  Physical Activity: Not on file  Stress: Not on file  Social Connections: Not on file   SDOH:  SDOH Screenings   Alcohol Screen: Medium Risk   Last Alcohol Screening Score (AUDIT): 9  Depression (PHQ2-9): Medium Risk   PHQ-2 Score: 6  Financial Resource  Strain: Not on file  Food Insecurity: Not on file  Housing: Not on file  Physical Activity: Not on file  Social Connections: Not on file  Stress: Not on file  Tobacco Use: Medium Risk   Smoking Tobacco Use: Former   Smokeless Tobacco Use: Former   Passive Exposure: Not on Chartered certified accountant Needs: Not on file   Additional Social History:                         Sleep: Fair  Appetite:  Fair  Current Medications:  Current Facility-Administered Medications  Medication Dose Route Frequency Provider Last Rate Last Admin   acetaminophen (TYLENOL) tablet 650 mg  650 mg Oral Q6H PRN Bobbitt, Shalon E, NP       alum & mag hydroxide-simeth (MAALOX/MYLANTA) 200-200-20 MG/5ML suspension 30 mL  30 mL Oral Q4H PRN Bobbitt, Shalon E, NP       hydrOXYzine (ATARAX) tablet 25 mg  25 mg Oral Q6H PRN Bobbitt, Shalon E, NP       loperamide (IMODIUM) capsule 2-4 mg  2-4 mg Oral PRN Bobbitt, Shalon E, NP       magnesium hydroxide (MILK OF MAGNESIA) suspension 30 mL  30 mL Oral Daily PRN Bobbitt, Shalon E, NP       multivitamin with minerals tablet 1 tablet  1 tablet Oral Daily Bobbitt, Shalon E, NP   1 tablet at 06/16/21 0912   ondansetron (ZOFRAN-ODT) disintegrating tablet 4 mg  4 mg Oral Q6H PRN Bobbitt, Shalon E, NP       thiamine tablet 100 mg  100 mg Oral Daily Bobbitt, Shalon E, NP   100 mg at 06/16/21 0912   traZODone (DESYREL) tablet 50 mg  50 mg Oral QHS PRN Bobbitt, Shalon E, NP   50 mg at 06/15/21 2106   venlafaxine XR (EFFEXOR-XR) 24 hr capsule 75 mg  75 mg Oral Q breakfast White, Patrice L, NP   75 mg at 06/16/21 1610   Current Outpatient Medications  Medication Sig Dispense Refill   traZODone (DESYREL) 50 MG tablet Take 1 tablet (50 mg total) by mouth at bedtime as needed for sleep. 30 tablet 1    Labs  Lab Results:  Admission on 06/14/2021  Component Date Value Ref Range Status   Magnesium 06/14/2021 2.2  1.7 - 2.4 mg/dL Final   Performed at [facility],  [address], [place], [place] [address]   Cholesterol 06/14/2021 146  0 - 200 mg/dL Final   Triglycerides 40/98/1191 54  <150 mg/dL Final   HDL 47/82/9562 49  >40 mg/dL Final   Total CHOL/HDL Ratio 06/14/2021 3.0  RATIO Final   VLDL 06/14/2021 11  0 - 40 mg/dL Final   LDL Cholesterol 06/14/2021 86  0 - 99 mg/dL Final   Comment:        Total Cholesterol/HDL:CHD Risk Coronary Heart Disease Risk Table                     Men   Women  1/2 Average Risk   3.4   3.3  Average Risk       5.0   4.4  2 X Average Risk   9.6   7.1  3 X Average Risk  23.4   11.0        Use the calculated Patient Ratio above and the CHD Risk Table to determine the patient's CHD Risk.        ATP III CLASSIFICATION (LDL):  <100     mg/dL   Optimal  100-129  mg/dL   Near or Above                    Optimal  130-159  mg/dL   Borderline  160-189  mg/dL   High  >190     mg/dL   Very High Performed at [place] [address], [place], [place] [address]   Admission on 06/13/2021, Discharged on 06/14/2021  Component Date Value Ref Range Status   WBC 06/14/2021 6.9  4.0 - 10.5 K/uL Final   RBC 06/14/2021 4.68  4.22 - 5.81 MIL/uL Final   Hemoglobin 06/14/2021 13.7  13.0 - 17.0 g/dL Final   HCT 06/14/2021 41.5  39.0 - 52.0 % Final   MCV 06/14/2021 88.7  80.0 - 100.0 fL Final   MCH 06/14/2021 29.3  26.0 - 34.0 pg Final   MCHC 06/14/2021 33.0  30.0 - 36.0 g/dL Final   RDW 06/14/2021 12.9  11.5 - 15.5 % Final   Platelets 06/14/2021 221  150 - 400 K/uL Final   nRBC 06/14/2021 0.0  0.0 - 0.2 % Final   Neutrophils Relative % 06/14/2021 62  % Final   Neutro Abs 06/14/2021 4.2  1.7 - 7.7 K/uL Final   Lymphocytes Relative 06/14/2021 28  % Final   Lymphs Abs 06/14/2021 1.9  0.7 - 4.0 K/uL Final   Monocytes Relative 06/14/2021 9  % Final   Monocytes Absolute 06/14/2021 0.6  0.1 - 1.0 K/uL Final   Eosinophils Relative 06/14/2021 1  % Final   Eosinophils Absolute 06/14/2021 0.1  0.0 - 0.5 K/uL Final   Basophils  Relative 06/14/2021 0  % Final   Basophils Absolute 06/14/2021 0.0  0.0 - 0.1 K/uL Final   Immature Granulocytes 06/14/2021 0  % Final   Abs Immature Granulocytes 06/14/2021 0.01  0.00 - 0.07 K/uL Final   Performed at [facility], [place] [address], [place], [place] [address]   Sodium 06/14/2021 136  135 - 145 mmol/L Final   Potassium 06/14/2021 4.1  3.5 - 5.1 mmol/L Final   Chloride 06/14/2021 101  98 - 111 mmol/L Final   CO2 06/14/2021 28  22 - 32 mmol/L Final   Glucose, Bld 06/14/2021 96  70 - 99 mg/dL Final   Glucose reference range applies only to samples taken after fasting for at least 8 hours.   BUN 06/14/2021 6  6 - 20 mg/dL Final   Creatinine, Ser 06/14/2021 0.71  0.61 - 1.24 mg/dL Final   Calcium 06/14/2021 9.3  8.9 - 10.3 mg/dL Final   Total Protein 06/14/2021 6.9  6.5 -  8.1 g/dL Final   Albumin 06/14/2021 3.9  3.5 - 5.0 g/dL Final   AST 06/14/2021 53 (H)  15 - 41 U/L Final   ALT 06/14/2021 51 (H)  0 - 44 U/L Final   Alkaline Phosphatase 06/14/2021 73  38 - 126 U/L Final   Total Bilirubin 06/14/2021 0.3  0.3 - 1.2 mg/dL Final   GFR, Estimated 06/14/2021 >60  >60 mL/min Final   Comment: (NOTE) Calculated using the CKD-EPI Creatinine Equation (2021)    Anion gap 06/14/2021 7  5 - 15 Final   Performed at [facility], [place] [address] [place], [place] [address]   Alcohol, Ethyl (B) 06/14/2021 <10  <10 mg/dL Final   Comment: (NOTE) Lowest detectable limit for serum alcohol is 10 mg/dL.  For medical purposes only. Performed at [facility], [place] [address] [place], [place] [address]    Opiates 06/14/2021 NONE DETECTED  NONE DETECTED Final   Cocaine 06/14/2021 NONE DETECTED  NONE DETECTED Final   Benzodiazepines 06/14/2021 NONE DETECTED  NONE DETECTED Final   Amphetamines 06/14/2021 POSITIVE (A)  NONE DETECTED Final   Tetrahydrocannabinol 06/14/2021 NONE DETECTED  NONE DETECTED Final   Barbiturates 06/14/2021 NONE DETECTED  NONE DETECTED Final   Comment:  (NOTE) DRUG SCREEN FOR MEDICAL PURPOSES ONLY.  IF CONFIRMATION IS NEEDED FOR ANY PURPOSE, NOTIFY LAB WITHIN 5 DAYS.  LOWEST DETECTABLE LIMITS FOR URINE DRUG SCREEN Drug Class                     Cutoff (ng/mL) Amphetamine and metabolites    1000 Barbiturate and metabolites    200 Benzodiazepine                 [id number] Tricyclics and metabolites     300 Opiates and metabolites        300 Cocaine and metabolites        300 THC                            50 Performed at [facility], [place] [address], [place], [place] [address]    SARS Coronavirus 2 by RT PCR 06/14/2021 NEGATIVE  NEGATIVE Final   Comment: (NOTE) SARS-CoV-2 target nucleic acids are NOT DETECTED.  The SARS-CoV-2 RNA is generally detectable in upper respiratory specimens during the acute phase of infection. The lowest concentration of SARS-CoV-2 viral copies this assay can detect is 138 copies/mL. A negative result does not preclude SARS-Cov-2 infection and should not be used as the sole basis for treatment or other patient management decisions. A negative result may occur with  improper specimen collection/handling, submission of specimen other than nasopharyngeal swab, presence of viral mutation(s) within the areas targeted by this assay, and inadequate number of viral copies(<138 copies/mL). A negative result must be combined with clinical observations, patient history, and epidemiological information. The expected result is Negative.  Fact Sheet for Patients:  EntrepreneurPulse.com.au  Fact Sheet for Healthcare Providers:  IncredibleEmployment.be  This test is no                          t yet approved or cleared by the [place] FDA and  has been authorized for detection and/or diagnosis of SARS-CoV-2 by FDA under an Emergency Use Authorization (EUA). This EUA will remain  in effect (meaning this test can be used) for the duration of the COVID-19  declaration under  Section 564(b)(1) of the Act, 21 U.S.C.section 360bbb-3(b)(1), unless the authorization is terminated  or revoked sooner.       Influenza A by PCR 06/14/2021 NEGATIVE  NEGATIVE Final   Influenza B by PCR 06/14/2021 NEGATIVE  NEGATIVE Final   Comment: (NOTE) The Xpert Xpress SARS-CoV-2/FLU/RSV plus assay is intended as an aid in the diagnosis of influenza from Nasopharyngeal swab specimens and should not be used as a sole basis for treatment. Nasal washings and aspirates are unacceptable for Xpert Xpress SARS-CoV-2/FLU/RSV testing.  Fact Sheet for Patients: EntrepreneurPulse.com.au  Fact Sheet for Healthcare Providers: IncredibleEmployment.be  This test is not yet approved or cleared by the [place] FDA and has been authorized for detection and/or diagnosis of SARS-CoV-2 by FDA under an Emergency Use Authorization (EUA). This EUA will remain in effect (meaning this test can be used) for the duration of the COVID-19 declaration under Section 564(b)(1) of the Act, 21 U.S.C. section 360bbb-3(b)(1), unless the authorization is terminated or revoked.  Performed at [facility], [place] [address], [place], [place] [address]   Admission on 04/28/2021, Discharged on 05/05/2021  Component Date Value Ref Range Status   TSH 04/29/2021 0.392  0.350 - 4.500 uIU/mL Final   Comment: Performed by a 3rd Generation assay with a functional sensitivity of <=0.01 uIU/mL. Performed at [facility], [place] [address], [place], [place] [address]    Hgb A1c MFr Bld 04/29/2021 5.4  4.8 - 5.6 % Final   Comment: (NOTE) Pre diabetes:          5.7%-6.4%  Diabetes:              >6.4%  Glycemic control for   <7.0% adults with diabetes    Mean Plasma Glucose 04/29/2021 108.28  mg/dL Final   Performed at [facility], [place] [address] [place], [place] [address]   Cholesterol 04/29/2021 202 (H)  0 - 200 mg/dL Final   Triglycerides  04/29/2021 134  <150 mg/dL Final   HDL 04/29/2021 63  >40 mg/dL Final   Total CHOL/HDL Ratio 04/29/2021 3.2  RATIO Final   VLDL 04/29/2021 27  0 - 40 mg/dL Final   LDL Cholesterol 04/29/2021 112 (H)  0 - 99 mg/dL Final   Comment:        Total Cholesterol/HDL:CHD Risk Coronary Heart Disease Risk Table                     Men   Women  1/2 Average Risk   3.4   3.3  Average Risk       5.0   4.4  2 X Average Risk   9.6   7.1  3 X Average Risk  23.4   11.0        Use the calculated Patient Ratio above and the CHD Risk Table to determine the patient's CHD Risk.        ATP III CLASSIFICATION (LDL):  <100     mg/dL   Optimal  100-129  mg/dL   Near or Above                    Optimal  130-159  mg/dL   Borderline  160-189  mg/dL   High  >190     mg/dL   Very High Performed at [place] [address] [place], [place] [address]    Sodium 04/30/2021 134 (L)  135 - 145 mmol/L  Final   Potassium 04/30/2021 4.0  3.5 - 5.1 mmol/L Final   Chloride 04/30/2021 102  98 - 111 mmol/L Final   CO2 04/30/2021 23  22 - 32 mmol/L Final   Glucose, Bld 04/30/2021 112 (H)  70 - 99 mg/dL Final   Glucose reference range applies only to samples taken after fasting for at least 8 hours.   BUN 04/30/2021 16  6 - 20 mg/dL Final   Creatinine, Ser 04/30/2021 0.61  0.61 - 1.24 mg/dL Final   Calcium 04/30/2021 9.0  8.9 - 10.3 mg/dL Final   GFR, Estimated 04/30/2021 >60  >60 mL/min Final   Comment: (NOTE) Calculated using the CKD-EPI Creatinine Equation (2021)    Anion gap 04/30/2021 9  5 - 15 Final   Performed at [facility], [place] [address] [place], [place] [address]   Sodium 05/02/2021 137  135 - 145 mmol/L Final   Potassium 05/02/2021 4.3  3.5 - 5.1 mmol/L Final   Chloride 05/02/2021 104  98 - 111 mmol/L Final   CO2 05/02/2021 25  22 - 32 mmol/L Final   Glucose, Bld 05/02/2021 102 (H)  70 - 99 mg/dL Final   Glucose reference range applies only to samples taken  after fasting for at least 8 hours.   BUN 05/02/2021 19  6 - 20 mg/dL Final   Creatinine, Ser 05/02/2021 0.65  0.61 - 1.24 mg/dL Final   Calcium 05/02/2021 9.2  8.9 - 10.3 mg/dL Final   GFR, Estimated 05/02/2021 >60  >60 mL/min Final   Comment: (NOTE) Calculated using the CKD-EPI Creatinine Equation (2021)    Anion gap 05/02/2021 8  5 - 15 Final   Performed at [facility], [place] [address], [place], [place] [address]  Admission on 04/28/2021, Discharged on 04/28/2021  Component Date Value Ref Range Status   Sodium 04/28/2021 136  135 - 145 mmol/L Final   Potassium 04/28/2021 3.4 (L)  3.5 - 5.1 mmol/L Final   Chloride 04/28/2021 106  98 - 111 mmol/L Final   CO2 04/28/2021 22  22 - 32 mmol/L Final   Glucose, Bld 04/28/2021 108 (H)  70 - 99 mg/dL Final   Glucose reference range applies only to samples taken after fasting for at least 8 hours.   BUN 04/28/2021 13  6 - 20 mg/dL Final   Creatinine, Ser 04/28/2021 0.66  0.61 - 1.24 mg/dL Final   Calcium 04/28/2021 9.0  8.9 - 10.3 mg/dL Final   Total Protein 04/28/2021 7.4  6.5 - 8.1 g/dL Final   Albumin 04/28/2021 4.2  3.5 - 5.0 g/dL Final   AST 04/28/2021 32  15 - 41 U/L Final   ALT 04/28/2021 36  0 - 44 U/L Final   Alkaline Phosphatase 04/28/2021 81  38 - 126 U/L Final   Total Bilirubin 04/28/2021 0.3  0.3 - 1.2 mg/dL Final   GFR, Estimated 04/28/2021 >60  >60 mL/min Final   Comment: (NOTE) Calculated using the CKD-EPI Creatinine Equation (2021)    Anion gap 04/28/2021 8  5 - 15 Final   Performed at [facility], [place] [address] [place], [place] [address]   Alcohol, Ethyl (B) 04/28/2021 136 (H)  <10 mg/dL Final   Comment: (NOTE) Lowest detectable limit for serum alcohol is 10 mg/dL.  For medical purposes only. Performed at [facility], [place] [address] [place], [place] [id number]    Salicylate Lvl [id number] <7.0 (L)  7.0 - 30.0 mg/dL Final  Performed at [facility], [place] [address], [place], [place] [address]   Acetaminophen (Tylenol), Serum 04/28/2021 <10 (L)  10 - 30 ug/mL Final   Comment: (NOTE) Therapeutic concentrations vary significantly. A range of 10-30 ug/mL  may be an effective concentration for many patients. However, some  are best treated at concentrations outside of this range. Acetaminophen concentrations >150 ug/mL at 4 hours after ingestion  and >50 ug/mL at 12 hours after ingestion are often associated with  toxic reactions.  Performed at [facility], [place] [address] [place], [place] [address]    WBC 04/28/2021 8.2  4.0 - 10.5 K/uL Final   RBC 04/28/2021 4.35  4.22 - 5.81 MIL/uL Final   Hemoglobin 04/28/2021 12.6 (L)  13.0 - 17.0 g/dL Final   HCT 04/28/2021 38.3 (L)  39.0 - 52.0 % Final   MCV 04/28/2021 88.0  80.0 - 100.0 fL Final   MCH 04/28/2021 29.0  26.0 - 34.0 pg Final   MCHC 04/28/2021 32.9  30.0 - 36.0 g/dL Final   RDW 04/28/2021 13.8  11.5 - 15.5 % Final   Platelets 04/28/2021 329  150 - 400 K/uL Final   nRBC 04/28/2021 0.0  0.0 - 0.2 % Final   Performed at [facility], [place] [address], [place], [place] [address]   Opiates 04/28/2021 NONE DETECTED  NONE DETECTED Final   Cocaine 04/28/2021 NONE DETECTED  NONE DETECTED Final   Benzodiazepines 04/28/2021 NONE DETECTED  NONE DETECTED Final   Amphetamines 04/28/2021 POSITIVE (A)  NONE DETECTED Final   Tetrahydrocannabinol 04/28/2021 NONE DETECTED  NONE DETECTED Final   Barbiturates 04/28/2021 NONE DETECTED  NONE DETECTED Final   Comment: (NOTE) DRUG SCREEN FOR MEDICAL PURPOSES ONLY.  IF CONFIRMATION IS NEEDED FOR ANY PURPOSE, NOTIFY LAB WITHIN 5 DAYS.  LOWEST DETECTABLE LIMITS FOR URINE DRUG SCREEN Drug Class                     Cutoff (ng/mL) Amphetamine and metabolites    1000 Barbiturate and metabolites    200 Benzodiazepine                 [id number] Tricyclics and metabolites     300 Opiates and metabolites         300 Cocaine and metabolites        300 THC                            50 Performed at [facility], [place] [address], [place], [place] [address]    SARS Coronavirus 2 by RT PCR 04/28/2021 NEGATIVE  NEGATIVE Final   Comment: (NOTE) SARS-CoV-2 target nucleic acids are NOT DETECTED.  The SARS-CoV-2 RNA is generally detectable in upper respiratory specimens during the acute phase of infection. The lowest concentration of SARS-CoV-2 viral copies this assay can detect is 138 copies/mL. A negative result does not preclude SARS-Cov-2 infection and should not be used as the sole basis for treatment or other patient management decisions. A negative result may occur with  improper specimen collection/handling, submission of specimen other than nasopharyngeal swab, presence of viral mutation(s) within the areas targeted by this assay, and inadequate number of viral copies(<138 copies/mL). A negative result must be combined with clinical observations, patient history, and epidemiological information. The expected result is Negative.  Fact Sheet for Patients:  EntrepreneurPulse.com.au  Fact Sheet for Healthcare Providers:  IncredibleEmployment.be  This  test is no                          t yet approved or cleared by the [place] and  has been authorized for detection and/or diagnosis of SARS-CoV-2 by FDA under an Emergency Use Authorization (EUA). This EUA will remain  in effect (meaning this test can be used) for the duration of the COVID-19 declaration under Section 564(b)(1) of the Act, 21 U.S.C.section 360bbb-3(b)(1), unless the authorization is terminated  or revoked sooner.       Influenza A by PCR 04/28/2021 NEGATIVE  NEGATIVE Final   Influenza B by PCR 04/28/2021 NEGATIVE  NEGATIVE Final   Comment: (NOTE) The Xpert Xpress SARS-CoV-2/FLU/RSV plus assay is intended as an aid in the diagnosis of influenza from  Nasopharyngeal swab specimens and should not be used as a sole basis for treatment. Nasal washings and aspirates are unacceptable for Xpert Xpress SARS-CoV-2/FLU/RSV testing.  Fact Sheet for Patients: EntrepreneurPulse.com.au  Fact Sheet for Healthcare Providers: IncredibleEmployment.be  This test is not yet approved or cleared by the [place] FDA and has been authorized for detection and/or diagnosis of SARS-CoV-2 by FDA under an Emergency Use Authorization (EUA). This EUA will remain in effect (meaning this test can be used) for the duration of the COVID-19 declaration under Section 564(b)(1) of the Act, 21 U.S.C. section 360bbb-3(b)(1), unless the authorization is terminated or revoked.  Performed at [facility], [place] [address], [place], [place] [address]   Admission on 03/19/2021, Discharged on 03/20/2021  Component Date Value Ref Range Status   Sodium 03/19/2021 138  135 - 145 mmol/L Final   Potassium 03/19/2021 4.0  3.5 - 5.1 mmol/L Final   Chloride 03/19/2021 102  98 - 111 mmol/L Final   CO2 03/19/2021 29  22 - 32 mmol/L Final   Glucose, Bld 03/19/2021 141 (H)  70 - 99 mg/dL Final   Glucose reference range applies only to samples taken after fasting for at least 8 hours.   BUN 03/19/2021 16  6 - 20 mg/dL Final   Creatinine, Ser 03/19/2021 1.05  0.61 - 1.24 mg/dL Final   Calcium 03/19/2021 9.1  8.9 - 10.3 mg/dL Final   Total Protein 03/19/2021 6.4 (L)  6.5 - 8.1 g/dL Final   Albumin 03/19/2021 3.8  3.5 - 5.0 g/dL Final   AST 03/19/2021 30  15 - 41 U/L Final   ALT 03/19/2021 35  0 - 44 U/L Final   Alkaline Phosphatase 03/19/2021 75  38 - 126 U/L Final   Total Bilirubin 03/19/2021 0.8  0.3 - 1.2 mg/dL Final   GFR, Estimated 03/19/2021 >60  >60 mL/min Final   Comment: (NOTE) Calculated using the CKD-EPI Creatinine Equation (2021)    Anion gap 03/19/2021 7  5 - 15 Final   Performed at [place] [address], [place], [place] [address]   Alcohol, Ethyl (B) 03/19/2021 <10  <10 mg/dL Final   Comment: (NOTE) Lowest detectable limit for serum alcohol is 10 mg/dL.  For medical purposes only. Performed at [facility], [place] [address], [place], [place] [id number]    Salicylate Lvl [id number] <7.0 (L)  7.0 - 30.0 mg/dL Final   Performed at [place] [address], [place], [place] [address]   Acetaminophen (Tylenol), Serum 03/19/2021 <10 (L)  10 - 30 ug/mL Final   Comment: (NOTE) Therapeutic concentrations vary significantly. A range  of 10-30 ug/mL  may be an effective concentration for many patients. However, some  are best treated at concentrations outside of this range. Acetaminophen concentrations >150 ug/mL at 4 hours after ingestion  and >50 ug/mL at 12 hours after ingestion are often associated with  toxic reactions.  Performed at [facility], [address], [place], [place] [address]    WBC 03/19/2021 7.3  4.0 - 10.5 K/uL Final   RBC 03/19/2021 4.32  4.22 - 5.81 MIL/uL Final   Hemoglobin 03/19/2021 12.4 (L)  13.0 - 17.0 g/dL Final   HCT 38/18/2993 38.6 (L)  39.0 - 52.0 % Final   MCV 03/19/2021 89.4  80.0 - 100.0 fL Final   MCH 03/19/2021 28.7  26.0 - 34.0 pg Final   MCHC 03/19/2021 32.1  30.0 - 36.0 g/dL Final   RDW 71/69/6789 13.4  11.5 - 15.5 % Final   Platelets 03/19/2021 220  150 - 400 K/uL Final   nRBC 03/19/2021 0.0  0.0 - 0.2 % Final   Performed at [facility], [address], [place], [place] [address]   Glucose-Capillary 03/19/2021 95  70 - 99 mg/dL Final   Glucose reference range applies only to samples taken after fasting for at least 8 hours.  Admission on 03/14/2021, Discharged on 03/14/2021  Component Date Value Ref Range Status   SARS Coronavirus 2 by RT PCR 03/14/2021 NEGATIVE  NEGATIVE Final   Comment: (NOTE) SARS-CoV-2 target nucleic acids are NOT DETECTED.  The SARS-CoV-2 RNA is generally detectable in upper  respiratory specimens during the acute phase of infection. The lowest concentration of SARS-CoV-2 viral copies this assay can detect is 138 copies/mL. A negative result does not preclude SARS-Cov-2 infection and should not be used as the sole basis for treatment or other patient management decisions. A negative result may occur with  improper specimen collection/handling, submission of specimen other than nasopharyngeal swab, presence of viral mutation(s) within the areas targeted by this assay, and inadequate number of viral copies(<138 copies/mL). A negative result must be combined with clinical observations, patient history, and epidemiological information. The expected result is Negative.  Fact Sheet for Patients:  BloggerCourse.com  Fact Sheet for Healthcare Providers:  SeriousBroker.it  This test is no                          t yet approved or cleared by the [place] FDA and  has been authorized for detection and/or diagnosis of SARS-CoV-2 by FDA under an Emergency Use Authorization (EUA). This EUA will remain  in effect (meaning this test can be used) for the duration of the COVID-19 declaration under Section 564(b)(1) of the Act, 21 U.S.C.section 360bbb-3(b)(1), unless the authorization is terminated  or revoked sooner.       Influenza A by PCR 03/14/2021 NEGATIVE  NEGATIVE Final   Influenza B by PCR 03/14/2021 NEGATIVE  NEGATIVE Final   Comment: (NOTE) The Xpert Xpress SARS-CoV-2/FLU/RSV plus assay is intended as an aid in the diagnosis of influenza from Nasopharyngeal swab specimens and should not be used as a sole basis for treatment. Nasal washings and aspirates are unacceptable for Xpert Xpress SARS-CoV-2/FLU/RSV testing.  Fact Sheet for Patients: BloggerCourse.com  Fact Sheet for Healthcare Providers: SeriousBroker.it  This test is not yet approved or  cleared by the [place] FDA and has been authorized for detection and/or diagnosis of SARS-CoV-2 by FDA under an Emergency Use Authorization (EUA). This EUA will  remain in effect (meaning this test can be used) for the duration of the COVID-19 declaration under Section 564(b)(1) of the Act, 21 U.S.C. section 360bbb-3(b)(1), unless the authorization is terminated or revoked.  Performed at [facility], [place] [address] [place], [place] [address]    Sodium 03/14/2021 139  135 - 145 mmol/L Final   Potassium 03/14/2021 4.5  3.5 - 5.1 mmol/L Final   Chloride 03/14/2021 104  98 - 111 mmol/L Final   CO2 03/14/2021 24  22 - 32 mmol/L Final   Glucose, Bld 03/14/2021 77  70 - 99 mg/dL Final   Glucose reference range applies only to samples taken after fasting for at least 8 hours.   BUN 03/14/2021 13  6 - 20 mg/dL Final   Creatinine, Ser 03/14/2021 0.70  0.61 - 1.24 mg/dL Final   Calcium 03/14/2021 9.8  8.9 - 10.3 mg/dL Final   Total Protein 03/14/2021 7.6  6.5 - 8.1 g/dL Final   Albumin 03/14/2021 4.4  3.5 - 5.0 g/dL Final   AST 03/14/2021 54 (H)  15 - 41 U/L Final   ALT 03/14/2021 66 (H)  0 - 44 U/L Final   Alkaline Phosphatase 03/14/2021 78  38 - 126 U/L Final   Total Bilirubin 03/14/2021 0.4  0.3 - 1.2 mg/dL Final   GFR, Estimated 03/14/2021 >60  >60 mL/min Final   Comment: (NOTE) Calculated using the CKD-EPI Creatinine Equation (2021)    Anion gap 03/14/2021 11  5 - 15 Final   Performed at [facility], [place] [address], [place], [place] [address]   Alcohol, Ethyl (B) 03/14/2021 <10  <10 mg/dL Final   Comment: (NOTE) Lowest detectable limit for serum alcohol is 10 mg/dL.  For medical purposes only. Performed at [facility], [place] [address] [place], [place] [address]    Opiates 03/14/2021 NONE DETECTED  NONE DETECTED Final   Cocaine 03/14/2021 NONE DETECTED  NONE DETECTED Final   Benzodiazepines 03/14/2021 NONE DETECTED   NONE DETECTED Final   Amphetamines 03/14/2021 NONE DETECTED  NONE DETECTED Final   Tetrahydrocannabinol 03/14/2021 NONE DETECTED  NONE DETECTED Final   Barbiturates 03/14/2021 NONE DETECTED  NONE DETECTED Final   Comment: (NOTE) DRUG SCREEN FOR MEDICAL PURPOSES ONLY.  IF CONFIRMATION IS NEEDED FOR ANY PURPOSE, NOTIFY LAB WITHIN 5 DAYS.  LOWEST DETECTABLE LIMITS FOR URINE DRUG SCREEN Drug Class                     Cutoff (ng/mL) Amphetamine and metabolites    1000 Barbiturate and metabolites    200 Benzodiazepine                 [id number] Tricyclics and metabolites     300 Opiates and metabolites        300 Cocaine and metabolites        300 THC                            50 Performed at [facility], [employer] [address] [place], [place] [address]    WBC 03/14/2021 7.2  4.0 - 10.5 K/uL Final   RBC 03/14/2021 5.25  4.22 - 5.81 MIL/uL Final   Hemoglobin 03/14/2021 15.2  13.0 - 17.0 g/dL Final   HCT 03/14/2021 47.9  39.0 - 52.0 % Final   MCV 03/14/2021 91.2  80.0 - 100.0 fL Final   MCH 03/14/2021 29.0  26.0 -  34.0 pg Final   MCHC 03/14/2021 31.7  30.0 - 36.0 g/dL Final   RDW 03/14/2021 13.8  11.5 - 15.5 % Final   Platelets 03/14/2021 266  150 - 400 K/uL Final   nRBC 03/14/2021 0.0  0.0 - 0.2 % Final   Neutrophils Relative % 03/14/2021 67  % Final   Neutro Abs 03/14/2021 4.8  1.7 - 7.7 K/uL Final   Lymphocytes Relative 03/14/2021 21  % Final   Lymphs Abs 03/14/2021 1.5  0.7 - 4.0 K/uL Final   Monocytes Relative 03/14/2021 9  % Final   Monocytes Absolute 03/14/2021 0.6  0.1 - 1.0 K/uL Final   Eosinophils Relative 03/14/2021 2  % Final   Eosinophils Absolute 03/14/2021 0.1  0.0 - 0.5 K/uL Final   Basophils Relative 03/14/2021 0  % Final   Basophils Absolute 03/14/2021 0.0  0.0 - 0.1 K/uL Final   Immature Granulocytes 03/14/2021 1  % Final   Abs Immature Granulocytes 03/14/2021 0.08 (H)  0.00 - 0.07 K/uL Final   Performed at [facility], [place]  [address], [place], [place] [address]   Acetaminophen (Tylenol), Serum 03/14/2021 <10 (L)  10 - 30 ug/mL Final   Comment: (NOTE) Therapeutic concentrations vary significantly. A range of 10-30 ug/mL  may be an effective concentration for many patients. However, some  are best treated at concentrations outside of this range. Acetaminophen concentrations >150 ug/mL at 4 hours after ingestion  and >50 ug/mL at 12 hours after ingestion are often associated with  toxic reactions.  Performed at [facility], [place] [address] [place], [place] [id number]    Salicylate Lvl [id number] <7.0 (L)  7.0 - 30.0 mg/dL Final   Performed at [place] [address], [place], [place] [address]  Admission on 02/12/2021, Discharged on 02/18/2021  Component Date Value Ref Range Status   POC Amphetamine UR 02/12/2021 None Detected  NONE DETECTED (Cut Off Level 1000 ng/mL) Final   POC Secobarbital (BAR) 02/12/2021 None Detected  NONE DETECTED (Cut Off Level 300 ng/mL) Final   POC Buprenorphine (BUP) 02/12/2021 None Detected  NONE DETECTED (Cut Off Level 10 ng/mL) Final   POC Oxazepam (BZO) 02/12/2021 None Detected  NONE DETECTED (Cut Off Level 300 ng/mL) Final   POC Cocaine UR 02/12/2021 None Detected  NONE DETECTED (Cut Off Level 300 ng/mL) Final   POC Methamphetamine UR 02/12/2021 None Detected  NONE DETECTED (Cut Off Level 1000 ng/mL) Final   POC Morphine 02/12/2021 Positive (A)  NONE DETECTED (Cut Off Level 300 ng/mL) Final   POC Oxycodone UR 02/12/2021 None Detected  NONE DETECTED (Cut Off Level 100 ng/mL) Final   POC Methadone UR 02/12/2021 None Detected  NONE DETECTED (Cut Off Level 300 ng/mL) Final   POC Marijuana UR 02/12/2021 Positive (A)  NONE DETECTED (Cut Off Level 50 ng/mL) Final  Admission on 02/12/2021, Discharged on 02/12/2021  Component Date Value Ref Range Status   SARS Coronavirus 2 by RT PCR 02/12/2021 NEGATIVE  NEGATIVE Final   Comment:  (NOTE) SARS-CoV-2 target nucleic acids are NOT DETECTED.  The SARS-CoV-2 RNA is generally detectable in upper respiratory specimens during the acute phase of infection. The lowest concentration of SARS-CoV-2 viral copies this assay can detect is 138 copies/mL. A negative result does not preclude SARS-Cov-2 infection and should not be used as the sole basis for treatment or other patient management decisions. A negative result may occur with  improper specimen collection/handling, submission of specimen other than  nasopharyngeal swab, presence of viral mutation(s) within the areas targeted by this assay, and inadequate number of viral copies(<138 copies/mL). A negative result must be combined with clinical observations, patient history, and epidemiological information. The expected result is Negative.  Fact Sheet for Patients:  EntrepreneurPulse.com.au  Fact Sheet for Healthcare Providers:  IncredibleEmployment.be  This test is no                          t yet approved or cleared by the [place] FDA and  has been authorized for detection and/or diagnosis of SARS-CoV-2 by FDA under an Emergency Use Authorization (EUA). This EUA will remain  in effect (meaning this test can be used) for the duration of the COVID-19 declaration under Section 564(b)(1) of the Act, 21 U.S.C.section 360bbb-3(b)(1), unless the authorization is terminated  or revoked sooner.       Influenza A by PCR 02/12/2021 NEGATIVE  NEGATIVE Final   Influenza B by PCR 02/12/2021 NEGATIVE  NEGATIVE Final   Comment: (NOTE) The Xpert Xpress SARS-CoV-2/FLU/RSV plus assay is intended as an aid in the diagnosis of influenza from Nasopharyngeal swab specimens and should not be used as a sole basis for treatment. Nasal washings and aspirates are unacceptable for Xpert Xpress SARS-CoV-2/FLU/RSV testing.  Fact Sheet for Patients: EntrepreneurPulse.com.au  Fact  Sheet for Healthcare Providers: IncredibleEmployment.be  This test is not yet approved or cleared by the [place] FDA and has been authorized for detection and/or diagnosis of SARS-CoV-2 by FDA under an Emergency Use Authorization (EUA). This EUA will remain in effect (meaning this test can be used) for the duration of the COVID-19 declaration under Section 564(b)(1) of the Act, 21 U.S.C. section 360bbb-3(b)(1), unless the authorization is terminated or revoked.  Performed at [facility], [place] [address] [place], [place] [address]    Sodium 02/12/2021 135  135 - 145 mmol/L Final   Potassium 02/12/2021 4.1  3.5 - 5.1 mmol/L Final   Chloride 02/12/2021 102  98 - 111 mmol/L Final   CO2 02/12/2021 28  22 - 32 mmol/L Final   Glucose, Bld 02/12/2021 92  70 - 99 mg/dL Final   Glucose reference range applies only to samples taken after fasting for at least 8 hours.   BUN 02/12/2021 13  6 - 20 mg/dL Final   Creatinine, Ser 02/12/2021 0.56 (L)  0.61 - 1.24 mg/dL Final   Calcium 02/12/2021 8.9  8.9 - 10.3 mg/dL Final   Total Protein 02/12/2021 6.7  6.5 - 8.1 g/dL Final   Albumin 02/12/2021 3.9  3.5 - 5.0 g/dL Final   AST 02/12/2021 19  15 - 41 U/L Final   ALT 02/12/2021 15  0 - 44 U/L Final   Alkaline Phosphatase 02/12/2021 75  38 - 126 U/L Final   Total Bilirubin 02/12/2021 0.6  0.3 - 1.2 mg/dL Final   GFR, Estimated 02/12/2021 >60  >60 mL/min Final   Comment: (NOTE) Calculated using the CKD-EPI Creatinine Equation (2021)    Anion gap 02/12/2021 5  5 - 15 Final   Performed at [facility], [place] [address], [place], [place] [address]   Alcohol, Ethyl (B) 02/12/2021 <10  <10 mg/dL Final   Comment: (NOTE) Lowest detectable limit for serum alcohol is 10 mg/dL.  For medical purposes only. Performed at [facility], [place] [address], [place], [place] [address]    WBC 02/12/2021 7.1  4.0 - 10.5 K/uL Final  RBC  02/12/2021 4.68  4.22 - 5.81 MIL/uL Final   Hemoglobin 02/12/2021 13.4  13.0 - 17.0 g/dL Final   HCT 16/10/960408/04/2021 40.7  39.0 - 52.0 % Final   MCV 02/12/2021 87.0  80.0 - 100.0 fL Final   MCH 02/12/2021 28.6  26.0 - 34.0 pg Final   MCHC 02/12/2021 32.9  30.0 - 36.0 g/dL Final   RDW 54/09/811908/04/2021 13.0  11.5 - 15.5 % Final   Platelets 02/12/2021 263  150 - 400 K/uL Final   nRBC 02/12/2021 0.0  0.0 - 0.2 % Final   Neutrophils Relative % 02/12/2021 73  % Final   Neutro Abs 02/12/2021 5.2  1.7 - 7.7 K/uL Final   Lymphocytes Relative 02/12/2021 18  % Final   Lymphs Abs 02/12/2021 1.3  0.7 - 4.0 K/uL Final   Monocytes Relative 02/12/2021 5  % Final   Monocytes Absolute 02/12/2021 0.4  0.1 - 1.0 K/uL Final   Eosinophils Relative 02/12/2021 3  % Final   Eosinophils Absolute 02/12/2021 0.2  0.0 - 0.5 K/uL Final   Basophils Relative 02/12/2021 1  % Final   Basophils Absolute 02/12/2021 0.1  0.0 - 0.1 K/uL Final   Immature Granulocytes 02/12/2021 0  % Final   Abs Immature Granulocytes 02/12/2021 0.03  0.00 - 0.07 K/uL Final   Performed at [facility], [address] [place], [place] [address]   Salicylate Lvl 02/12/2021 <7.0 (L)  7.0 - 30.0 mg/dL Final   Performed at [facility], [address] [place], [place] [address]   Acetaminophen (Tylenol), Serum 02/12/2021 <10 (L)  10 - 30 ug/mL Final   Comment: (NOTE) Therapeutic concentrations vary significantly. A range of 10-30 ug/mL  may be an effective concentration for many patients. However, some  are best treated at concentrations outside of this range. Acetaminophen concentrations >150 ug/mL at 4 hours after ingestion  and >50 ug/mL at 12 hours after ingestion are often associated with  toxic reactions.  Performed at [facility], [address] [place], [place] [address]   Admission on 02/08/2021, Discharged on 02/10/2021  Component Date Value Ref Range Status   SARS Coronavirus 2 by  RT PCR 02/08/2021 NEGATIVE  NEGATIVE Final   Comment: (NOTE) SARS-CoV-2 target nucleic acids are NOT DETECTED.  The SARS-CoV-2 RNA is generally detectable in upper respiratory specimens during the acute phase of infection. The lowest concentration of SARS-CoV-2 viral copies this assay can detect is 138 copies/mL. A negative result does not preclude SARS-Cov-2 infection and should not be used as the sole basis for treatment or other patient management decisions. A negative result may occur with  improper specimen collection/handling, submission of specimen other than nasopharyngeal swab, presence of viral mutation(s) within the areas targeted by this assay, and inadequate number of viral copies(<138 copies/mL). A negative result must be combined with clinical observations, patient history, and epidemiological information. The expected result is Negative.  Fact Sheet for Patients:  BloggerCourse.comhttps://www.fda.gov/media/152166/download  Fact Sheet for Healthcare Providers:  SeriousBroker.ithttps://www.fda.gov/media/152162/download  This test is no                          t yet approved or cleared by the [place] FDA and  has been authorized for detection and/or diagnosis of SARS-CoV-2 by FDA under an Emergency Use Authorization (EUA). This EUA will remain  in effect (meaning this test can be used) for the duration of the COVID-19 declaration under  Section 564(b)(1) of the Act, 21 U.S.C.section 360bbb-3(b)(1), unless the authorization is terminated  or revoked sooner.       Influenza A by PCR 02/08/2021 NEGATIVE  NEGATIVE Final   Influenza B by PCR 02/08/2021 NEGATIVE  NEGATIVE Final   Comment: (NOTE) The Xpert Xpress SARS-CoV-2/FLU/RSV plus assay is intended as an aid in the diagnosis of influenza from Nasopharyngeal swab specimens and should not be used as a sole basis for treatment. Nasal washings and aspirates are unacceptable for Xpert Xpress SARS-CoV-2/FLU/RSV testing.  Fact Sheet for  Patients: EntrepreneurPulse.com.au  Fact Sheet for Healthcare Providers: IncredibleEmployment.be  This test is not yet approved or cleared by the [place] FDA and has been authorized for detection and/or diagnosis of SARS-CoV-2 by FDA under an Emergency Use Authorization (EUA). This EUA will remain in effect (meaning this test can be used) for the duration of the COVID-19 declaration under Section 564(b)(1) of the Act, 21 U.S.C. section 360bbb-3(b)(1), unless the authorization is terminated or revoked.  Performed at [facility], [place] [address] [place], [place] [address]    WBC 02/08/2021 6.5  4.0 - 10.5 K/uL Final   RBC 02/08/2021 5.15  4.22 - 5.81 MIL/uL Final   Hemoglobin 02/08/2021 14.9  13.0 - 17.0 g/dL Final   HCT 02/08/2021 44.9  39.0 - 52.0 % Final   MCV 02/08/2021 87.2  80.0 - 100.0 fL Final   MCH 02/08/2021 28.9  26.0 - 34.0 pg Final   MCHC 02/08/2021 33.2  30.0 - 36.0 g/dL Final   RDW 02/08/2021 13.0  11.5 - 15.5 % Final   Platelets 02/08/2021 330  150 - 400 K/uL Final   nRBC 02/08/2021 0.0  0.0 - 0.2 % Final   Neutrophils Relative % 02/08/2021 69  % Final   Neutro Abs 02/08/2021 4.5  1.7 - 7.7 K/uL Final   Lymphocytes Relative 02/08/2021 20  % Final   Lymphs Abs 02/08/2021 1.3  0.7 - 4.0 K/uL Final   Monocytes Relative 02/08/2021 7  % Final   Monocytes Absolute 02/08/2021 0.4  0.1 - 1.0 K/uL Final   Eosinophils Relative 02/08/2021 3  % Final   Eosinophils Absolute 02/08/2021 0.2  0.0 - 0.5 K/uL Final   Basophils Relative 02/08/2021 0  % Final   Basophils Absolute 02/08/2021 0.0  0.0 - 0.1 K/uL Final   Immature Granulocytes 02/08/2021 1  % Final   Abs Immature Granulocytes 02/08/2021 0.05  0.00 - 0.07 K/uL Final   Performed at [facility], [place] [address], [place], [place] [address]   Sodium 02/08/2021 137  135 - 145 mmol/L Final   Potassium 02/08/2021 4.4  3.5 - 5.1 mmol/L Final   Chloride 02/08/2021 101  98 -  111 mmol/L Final   CO2 02/08/2021 26  22 - 32 mmol/L Final   Glucose, Bld 02/08/2021 75  70 - 99 mg/dL Final   Glucose reference range applies only to samples taken after fasting for at least 8 hours.   BUN 02/08/2021 12  6 - 20 mg/dL Final   Creatinine, Ser 02/08/2021 0.83  0.61 - 1.24 mg/dL Final   Calcium 02/08/2021 9.9  8.9 - 10.3 mg/dL Final   Total Protein 02/08/2021 6.9  6.5 - 8.1 g/dL Final   Albumin 02/08/2021 4.0  3.5 - 5.0 g/dL Final   AST 02/08/2021 19  15 - 41 U/L Final   ALT 02/08/2021 19  0 - 44 U/L Final   Alkaline Phosphatase 02/08/2021 87  38 -  126 U/L Final   Total Bilirubin 02/08/2021 0.4  0.3 - 1.2 mg/dL Final   GFR, Estimated 02/08/2021 >60  >60 mL/min Final   Comment: (NOTE) Calculated using the CKD-EPI Creatinine Equation (2021)    Anion gap 02/08/2021 10  5 - 15 Final   Performed at [place] [address] [place], [place] [address]   Alcohol, Ethyl (B) 02/08/2021 <10  <10 mg/dL Final   Comment: (NOTE) Lowest detectable limit for serum alcohol is 10 mg/dL.  For medical purposes only. Performed at [facility], [place] [address] [place], [place] [address]    POC Amphetamine UR 02/08/2021 None Detected  NONE DETECTED (Cut Off Level 1000 ng/mL) Final   POC Secobarbital (BAR) 02/08/2021 None Detected  NONE DETECTED (Cut Off Level 300 ng/mL) Final   POC Buprenorphine (BUP) 02/08/2021 None Detected  NONE DETECTED (Cut Off Level 10 ng/mL) Final   POC Oxazepam (BZO) 02/08/2021 None Detected  NONE DETECTED (Cut Off Level 300 ng/mL) Final   POC Cocaine UR 02/08/2021 None Detected  NONE DETECTED (Cut Off Level 300 ng/mL) Final   POC Methamphetamine UR 02/08/2021 None Detected  NONE DETECTED (Cut Off Level 1000 ng/mL) Final   POC Morphine 02/08/2021 None Detected  NONE DETECTED (Cut Off Level 300 ng/mL) Final   POC Oxycodone UR 02/08/2021 None Detected  NONE DETECTED (Cut Off Level 100 ng/mL) Final   POC Methadone UR 02/08/2021 None Detected  NONE  DETECTED (Cut Off Level 300 ng/mL) Final   POC Marijuana UR 02/08/2021 None Detected  NONE DETECTED (Cut Off Level 50 ng/mL) Final   SARSCOV2ONAVIRUS 2 AG 02/08/2021 NEGATIVE  NEGATIVE Final   Comment: (NOTE) SARS-CoV-2 antigen NOT DETECTED.   Negative results are presumptive.  Negative results do not preclude SARS-CoV-2 infection and should not be used as the sole basis for treatment or other patient management decisions, including infection  control decisions, particularly in the presence of clinical signs and  symptoms consistent with COVID-19, or in those who have been in contact with the virus.  Negative results must be combined with clinical observations, patient history, and epidemiological information. The expected result is Negative.  Fact Sheet for Patients: HandmadeRecipes.com.cy  Fact Sheet for Healthcare Providers: FuneralLife.at  This test is not yet approved or cleared by the [place] FDA and  has been authorized for detection and/or diagnosis of SARS-CoV-2 by FDA under an Emergency Use Authorization (EUA).  This EUA will remain in effect (meaning this test can be used) for the duration of  the COV                          ID-19 declaration under Section 564(b)(1) of the Act, 21 U.S.C. section 360bbb-3(b)(1), unless the authorization is terminated or revoked sooner.     SARS Coronavirus 2 Ag 02/08/2021 Negative  Negative Final   SARSCOV2ONAVIRUS 2 AG 02/08/2021 NEGATIVE  NEGATIVE Final   Comment: (NOTE) SARS-CoV-2 antigen NOT DETECTED.   Negative results are presumptive.  Negative results do not preclude SARS-CoV-2 infection and should not be used as the sole basis for treatment or other patient management decisions, including infection  control decisions, particularly in the presence of clinical signs and  symptoms consistent with COVID-19, or in those who have been in contact with the virus.  Negative results  must be combined with clinical observations, patient history, and epidemiological information. The expected result is Negative.  Fact Sheet for Patients: HandmadeRecipes.com.cy  Fact Sheet for Healthcare Providers: FuneralLife.at  This test is not yet approved or cleared by the [place] FDA and  has been authorized for detection and/or diagnosis of SARS-CoV-2 by FDA under an Emergency Use Authorization (EUA).  This EUA will remain in effect (meaning this test can be used) for the duration of  the COV                          ID-19 declaration under Section 564(b)(1) of the Act, 21 U.S.C. section 360bbb-3(b)(1), unless the authorization is terminated or revoked sooner.      Blood Alcohol level:  Lab Results  Component Value Date   ETH <10 06/14/2021   ETH 136 (H) [id number]    Metabolic Disorder Labs: Lab Results  Component Value Date   HGBA1C 5.4 04/29/2021   MPG 108.28 04/29/2021   MPG 105 12/03/2020   No results found for: PROLACTIN Lab Results  Component Value Date   CHOL 146 06/14/2021   TRIG 54 06/14/2021   HDL 49 06/14/2021   CHOLHDL 3.0 06/14/2021   VLDL 11 06/14/2021   LDLCALC 86 06/14/2021   LDLCALC 112 (H) 04/29/2021    Therapeutic Lab Levels: No results found for: LITHIUM No results found for: VALPROATE No components found for:  CBMZ  Physical Findings   AIMS    Flowsheet Row Admission (Discharged) from 04/28/2021 in [place] 400B Admission (Discharged) from 11/30/2020 in [place] 300B Admission (Discharged) from 11/21/2019 in [place] 300B  AIMS Total Score 0 0 0      AUDIT    Flowsheet Row Admission (Discharged) from 04/28/2021 in [place] 400B Admission (Discharged) from 11/30/2020 in [facility] 300B Admission (Discharged) from 11/21/2019  in [place] 300B  Alcohol Use Disorder Identification Test Final Score (AUDIT) 9 7 8       GAD-7    Flowsheet Row Office Visit from 06/04/2021 in [place] Office Visit from 05/20/2021 in [place]  Total GAD-7 Score 1 0      PHQ2-9    [place] ED from 06/13/2021 in [place] Office Visit from 06/04/2021 in [place] Office Visit from 05/20/2021 in [place] [facility] from 04/28/2021 in [place] DEPT ED from 11/30/2020 in [place]  PHQ-2 Total Score 4 1 0 5 5  PHQ-9 Total Score 6 1 0 21 26      Flowsheet Row ED from 06/13/2021 in [place] Most recent reading at 06/14/2021 12:49 AM Admission (Discharged) from 04/28/2021 in [place] [facility] Most recent reading at 04/28/2021 10:20 PM ED from 04/28/2021 in [place] DEPT Most recent reading at 04/28/2021  6:41 AM  C-SSRS RISK CATEGORY High Risk High Risk High Risk        Musculoskeletal  Strength & Muscle Tone: within normal limits Gait & Station: normal Patient leans: N/A  Psychiatric Specialty Exam  Presentation  General Appearance: Appropriate for Environment; Casual  Eye Contact:Fair  Speech:Clear and Coherent; Normal Rate  Speech Volume:Normal  Handedness:Right   Mood and Affect  Mood:Euthymic  Affect:Appropriate; Congruent   Thought Process  Thought Processes:Coherent; Goal Directed; Linear  Descriptions of Associations:Intact  Orientation:Full (  Time, Place and Person)  Thought Content:WDL; Logical  Diagnosis of Schizophrenia or Schizoaffective disorder in past: No    Hallucinations:Hallucinations: None  Ideas of Reference:None  Suicidal  Thoughts:Suicidal Thoughts: No  Homicidal Thoughts:Homicidal Thoughts: No   Sensorium  Memory:Immediate Good; Recent Good; Remote Good  Judgment:Good  Insight:Fair   Executive Functions  Concentration:Fair  Attention Span:Good  [place] of Knowledge:Good  Language:Good   Psychomotor Activity  Psychomotor Activity:Psychomotor Activity: Normal   Assets  Assets:Communication Skills; Desire for Improvement; Leisure Time; Physical Health   Sleep  Sleep:Sleep: Fair Number of Hours of Sleep: 8   No data recorded  Physical Exam  Physical Exam Constitutional:      Appearance: Normal appearance. He is normal weight.  HENT:     Head: Normocephalic and atraumatic.  Eyes:     Extraocular Movements: Extraocular movements intact.     Conjunctiva/sclera: Conjunctivae normal.  Pulmonary:     Effort: Pulmonary effort is normal.  Neurological:     General: No focal deficit present.     Mental Status: He is alert and oriented to person, place, and time.  Psychiatric:        Attention and Perception: Attention and perception normal.        Speech: Speech normal.        Behavior: Behavior normal. Behavior is cooperative.        Thought Content: Thought content normal.   Review of Systems  Constitutional:  Negative for chills and fever.  HENT:  Negative for hearing loss.   Eyes:  Negative for discharge and redness.  Respiratory:  Negative for cough.   Cardiovascular:  Negative for chest pain.  Gastrointestinal:  Negative for abdominal pain.  Musculoskeletal:  Negative for myalgias.  Neurological:  Negative for headaches.  Blood pressure (!) 128/98, pulse 88, temperature 98.6 F (37 C), temperature source Oral, resp. rate 16, SpO2 98 %. There is no height or weight on file to calculate BMI.  Treatment Plan Summary: 30 year old male with past psychiatric history of MDD, opioid use disorder, polysubstance use who presented to [facility] for suicidal  ideation on 12/10.  Patient was transferred to [facility] for continued treatment on this date.  Patient was restarted on his home medication of Effexor 75 mg. UDS+amphetamine. Etoh neg.  Today patient denies SI/HI/AVH and requested interest in going to residential rehab.  Social work assisting with this.  Patient remains appropriate for continued treatment in the [facility] for ongoing mood stabilization  MDD -continue venlafaxine 75 mg  Anxiety -continue prn vistaril  Dispo: ongoing. SW assisting-pt with interest in residential rehab treatment.  Ival Bible, MD 06/16/2021 5:17 PM

## 2021-06-17 DIAGNOSIS — R45851 Suicidal ideations: Secondary | ICD-10-CM | POA: Diagnosis not present

## 2021-06-17 DIAGNOSIS — F1190 Opioid use, unspecified, uncomplicated: Secondary | ICD-10-CM | POA: Diagnosis not present

## 2021-06-17 DIAGNOSIS — F332 Major depressive disorder, recurrent severe without psychotic features: Secondary | ICD-10-CM | POA: Diagnosis not present

## 2021-06-17 MED ORDER — VENLAFAXINE HCL ER 75 MG PO CP24: 75.0000 mg | ORAL_CAPSULE | Freq: Every day | ORAL | 0 refills | Status: AC

## 2021-06-17 NOTE — ED Notes (Signed)
Snacks given 

## 2021-06-17 NOTE — Group Note (Signed)
Group Topic: Balance in Life  Group Date: 06/17/2021 Start Time: 1000 End Time: 1040 Facilitators: Levander Campion  Department: [facility]  Number of Participants: 4  Group Focus: concentration, feeling awareness/expression, and self-awareness Treatment Modality:  Individual Therapy Interventions utilized were patient education Purpose: enhance coping skills  Name: Jared Jordan Date of Birth: 1990/11/27  MR: [id number]    Level of Participation: active Quality of Participation: attentive and cooperative Interactions with others: gave feedback Mood/Affect: appropriate Triggers (if applicable): n/a Cognition: coherent/clear Progress: Moderate Response: n/a Plan: follow-up needed  Patients Problems:  Patient Active Problem List   Diagnosis Date Noted   Opioid use disorder 06/14/2021   Psychophysiological insomnia 06/05/2021   Alcohol abuse 06/05/2021   Gastroesophageal reflux disease without esophagitis 06/05/2021   Opiate abuse, continuous (HCC) 04/29/2021   Hepatitis C 04/29/2021   MDD (major depressive disorder), recurrent episode (HCC) 03/14/2021   MDD (major depressive disorder), recurrent severe, without psychosis (HCC) 02/08/2021   MDD (major depressive disorder), recurrent episode, severe (HCC) 11/30/2020   Major depressive disorder, recurrent severe without psychotic features (HCC)    Substance induced mood disorder (HCC) 11/22/2019

## 2021-06-17 NOTE — Discharge Instructions (Signed)
Take all medications as prescribed by his/her mental healthcare provider. °Report any adverse effects and or reactions from the medicines to your outpatient provider promptly. °Do not engage in alcohol and or illegal drug use while on prescription medicines. °In the event of worsening symptoms, call the crisis hotline, 911 and or go to the nearest ED for appropriate evaluation and treatment of symptoms. °follow-up with your primary care provider for your other medical issues, concerns and or health care needs. ° ° ° ° °Please come to [facility] (this facility) during walk in hours for appointment with psychiatrist for further medication management and for therapists for therapy.  ° ° Walk in hours are 8-11 AM Monday through Thursday for medication management. Therapy walk in hours are Monday-Wednesday 8 AM-1PM.   It is first come, first -serve; it is best to arrive by 7:00 AM.  ° °On Friday from 1 pm to 4 pm for therapy intake only. Please arrive by 12:00 pm as it is  first come, first -serve.   ° °When you arrive please go upstairs for your appointment. If you are unsure of where to go, inform the front desk that you are here for a walk in appointment and they will assist you with directions upstairs. ° °Address:  °[address], in [place], [address] °Ph: [phone]  ° °

## 2021-06-17 NOTE — ED Provider Notes (Signed)
Behavioral Health Progress Note  Date and Time: 06/17/2021 4:22 PM Name: Jared Jordan MRN:  [id number]  Subjective:   30 year old male with past psychiatric history of MDD, opioid use disorder, polysubstance use who presented to [facility], ED for suicidal ideation on 12/10.  Patient was transferred to [facility] for continued treatment on this date.  Patient was restarted on his home medication of Effexor 75 mg. UDS+amphetamine. Etoh neg.  Patient seen and chart reviewed.  Patient has been medication compliant and been appropriate with staff and peers on the unit.  Patient found laying in bed in no acute distress.  Patient reports his mood as "good".  Affect is bright.  He denies SI/HI/AVH.  Patient continues to express interest in going straight from the [facility] to residential rehab.  Discussed with patient that social work is continuing to seek placement in residential rehab; however, it may not be possible to obtain placement to go directly from the [facility] to rehab and inquired about what his plans would be if this were the case.  Patient indicates that he would go to a shelter and that "I end up or end up".  When asked if he has any friends or family that he is able to stay with, patient stated that he would not be able to stay with his family as they do not live in town.  Informed patient that social work will be by to speak with him regarding any updates and to discuss further discharge planning.  Patient verbalized understanding and expressed gratitude for help he has received all the [name].Patient denies all physical complaints today.  Diagnosis:  Final diagnoses:  Opioid use disorder  MDD (major depressive disorder), recurrent severe, without psychosis (HCC)  Suicidal ideation    Total Time spent with patient: 15 minutes  Past Psychiatric History: MDD, substance use Past Medical History:  Past Medical History:  Diagnosis Date   Anxiety    Depression    Drug use     Past Surgical History:   Procedure Laterality Date   SHOULDER SURGERY     SHOULDER SURGERY Left 2020   Family History: No family history on file. Family Psychiatric  History: denies Social History:  Social History   Substance and Sexual Activity  Alcohol Use Not Currently   Alcohol/week: 4.0 - 6.0 standard drinks   Types: 4 - 6 Cans of beer per week   Comment: normally, 4-6 beers weekly; last drank 2 bottles of champagne yesterday     Social History   Substance and Sexual Activity  Drug Use Not Currently   Types: IV, Heroin, Marijuana, Other-see comments   Comment: using 1gm everyday heroin - for past 3 weeks    Social History   Socioeconomic History   Marital status: Single    Spouse name: Not on file   Number of children: Not on file   Years of education: Not on file   Highest education level: Not on file  Occupational History   Not on file  Tobacco Use   Smoking status: Former    Packs/day: 0.25    Years: 1.00    Pack years: 0.25    Types: Cigarettes   Smokeless tobacco: Former    Quit date: 04/15/2021  Vaping Use   Vaping Use: Some days   Start date: 11/04/2018   Devices: Jewel Menthol  Substance and Sexual Activity   Alcohol use: Not Currently    Alcohol/week: 4.0 - 6.0 standard drinks    Types: 4 -  6 Cans of beer per week    Comment: normally, 4-6 beers weekly; last drank 2 bottles of champagne yesterday   Drug use: Not Currently    Types: IV, Heroin, Marijuana, Other-see comments    Comment: using 1gm everyday heroin - for past 3 weeks   Sexual activity: Not Currently  Other Topics Concern   Not on file  Social History Narrative   Pt is grieving the death of his father last year. 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 Lives alone and counts his mom as social support. Has siblings but doesn't speak to them.   Social Determinants of Health   Financial Resource Strain: Not on file  Food Insecurity: Not on file  Transportation Needs: Not on file  Physical  Activity: Not on file  Stress: Not on file  Social Connections: Not on file   SDOH:  SDOH Screenings   Alcohol Screen: Medium Risk   Last Alcohol Screening Score (AUDIT): 9  Depression (PHQ2-9): Medium Risk   PHQ-2 Score: 6  Financial Resource Strain: Not on file  Food Insecurity: Not on file  Housing: Not on file  Physical Activity: Not on file  Social Connections: Not on file  Stress: Not on file  Tobacco Use: Medium Risk   Smoking Tobacco Use: Former   Smokeless Tobacco Use: Former   Passive Exposure: Not on Chartered certified accountant Needs: Not on file   Additional Social History:                         Sleep: Good  Appetite:  Good  Current Medications:  Current Facility-Administered Medications  Medication Dose Route Frequency Provider Last Rate Last Admin   acetaminophen (TYLENOL) tablet 650 mg  650 mg Oral Q6H PRN Bobbitt, Shalon E, NP       alum & mag hydroxide-simeth (MAALOX/MYLANTA) 200-200-20 MG/5ML suspension 30 mL  30 mL Oral Q4H PRN Bobbitt, Shalon E, NP       magnesium hydroxide (MILK OF MAGNESIA) suspension 30 mL  30 mL Oral Daily PRN Bobbitt, Shalon E, NP       multivitamin with minerals tablet 1 tablet  1 tablet Oral Daily Bobbitt, Shalon E, NP   1 tablet at 06/17/21 0946   thiamine tablet 100 mg  100 mg Oral Daily Bobbitt, Shalon E, NP   100 mg at 06/17/21 0946   traZODone (DESYREL) tablet 50 mg  50 mg Oral QHS PRN Bobbitt, Shalon E, NP   50 mg at 06/15/21 2106   venlafaxine XR (EFFEXOR-XR) 24 hr capsule 75 mg  75 mg Oral Q breakfast White, Patrice L, NP   75 mg at 06/17/21 0865   Current Outpatient Medications  Medication Sig Dispense Refill   traZODone (DESYREL) 50 MG tablet Take 1 tablet (50 mg total) by mouth at bedtime as needed for sleep. 30 tablet 1    Labs  Lab Results:  Admission on 06/14/2021  Component Date Value Ref Range Status   Magnesium 06/14/2021 2.2  1.7 - 2.4 mg/dL Final   Performed at [facility], [address] [place], [place] [address]   Cholesterol 06/14/2021 146  0 - 200 mg/dL Final   Triglycerides 62/95/2841 54  <150 mg/dL Final   HDL 32/44/0102 49  >40 mg/dL Final   Total CHOL/HDL Ratio 06/14/2021 3.0  RATIO Final   VLDL 06/14/2021 11  0 - 40 mg/dL Final   LDL Cholesterol 06/14/2021 86  0 - 99 mg/dL Final   Comment:        Total Cholesterol/HDL:CHD Risk Coronary Heart Disease Risk Table                     Men   Women  1/2 Average Risk   3.4   3.3  Average Risk       5.0   4.4  2 X Average Risk   9.6   7.1  3 X Average Risk  23.4   11.0        Use the calculated Patient Ratio above and the CHD Risk Table to determine the patient's CHD Risk.        ATP III CLASSIFICATION (LDL):  <100     mg/dL   Optimal  161-096  mg/dL   Near or Above                    Optimal  130-159  mg/dL   Borderline  045-409  mg/dL   High  >811     mg/dL   Very High Performed at [facility], [address], [place], [place] [address]   Admission on 06/13/2021, Discharged on 06/14/2021  Component Date Value Ref Range Status   WBC 06/14/2021 6.9  4.0 - 10.5 K/uL Final   RBC 06/14/2021 4.68  4.22 - 5.81 MIL/uL Final   Hemoglobin 06/14/2021 13.7  13.0 - 17.0 g/dL Final   HCT 29/56/2130 41.5  39.0 - 52.0 % Final   MCV 06/14/2021 88.7  80.0 - 100.0 fL Final   MCH 06/14/2021 29.3  26.0 - 34.0 pg Final   MCHC 06/14/2021 33.0  30.0 - 36.0 g/dL Final   RDW 86/57/8469 12.9  11.5 - 15.5 % Final   Platelets 06/14/2021 221  150 - 400 K/uL Final   nRBC 06/14/2021 0.0  0.0 - 0.2 % Final   Neutrophils Relative % 06/14/2021 62  % Final   Neutro Abs 06/14/2021 4.2  1.7 - 7.7 K/uL Final   Lymphocytes Relative 06/14/2021 28  % Final   Lymphs Abs 06/14/2021 1.9  0.7 - 4.0 K/uL Final   Monocytes Relative 06/14/2021 9  % Final   Monocytes Absolute 06/14/2021 0.6  0.1 - 1.0 K/uL Final   Eosinophils Relative 06/14/2021 1  % Final   Eosinophils Absolute 06/14/2021 0.1  0.0 - 0.5 K/uL Final   Basophils Relative  06/14/2021 0  % Final   Basophils Absolute 06/14/2021 0.0  0.0 - 0.1 K/uL Final   Immature Granulocytes 06/14/2021 0  % Final   Abs Immature Granulocytes 06/14/2021 0.01  0.00 - 0.07 K/uL Final   Performed at [facility], [address] [place], [place] [address]   Sodium 06/14/2021 136  135 - 145 mmol/L Final   Potassium 06/14/2021 4.1  3.5 - 5.1 mmol/L Final   Chloride 06/14/2021 101  98 - 111 mmol/L Final   CO2 06/14/2021 28  22 - 32 mmol/L Final   Glucose, Bld 06/14/2021 96  70 - 99 mg/dL Final   Glucose reference range applies only to samples taken after fasting for at least 8 hours.   BUN 06/14/2021 6  6 - 20 mg/dL Final   Creatinine, Ser 06/14/2021 0.71  0.61 - 1.24 mg/dL Final   Calcium 84/13/2440 9.3  8.9 - 10.3 mg/dL Final   Total Protein 05/02/2535 6.9  6.5 - 8.1 g/dL Final   Albumin 64/40/3474 3.9  3.5 - 5.0  g/dL Final   AST 84/53/6468 53 (H)  15 - 41 U/L Final   ALT 06/14/2021 51 (H)  0 - 44 U/L Final   Alkaline Phosphatase 06/14/2021 73  38 - 126 U/L Final   Total Bilirubin 06/14/2021 0.3  0.3 - 1.2 mg/dL Final   GFR, Estimated 06/14/2021 >60  >60 mL/min Final   Comment: (NOTE) Calculated using the CKD-EPI Creatinine Equation (2021)    Anion gap 06/14/2021 7  5 - 15 Final   Performed at [facility], [address] [place], [place] [address]   Alcohol, Ethyl (B) 06/14/2021 <10  <10 mg/dL Final   Comment: (NOTE) Lowest detectable limit for serum alcohol is 10 mg/dL.  For medical purposes only. Performed at [facility], [address], [place], [place] [address]    Opiates 06/14/2021 NONE DETECTED  NONE DETECTED Final   Cocaine 06/14/2021 NONE DETECTED  NONE DETECTED Final   Benzodiazepines 06/14/2021 NONE DETECTED  NONE DETECTED Final   Amphetamines 06/14/2021 POSITIVE (A)  NONE DETECTED Final   Tetrahydrocannabinol 06/14/2021 NONE DETECTED  NONE DETECTED Final   Barbiturates 06/14/2021 NONE DETECTED  NONE DETECTED Final   Comment:  (NOTE) DRUG SCREEN FOR MEDICAL PURPOSES ONLY.  IF CONFIRMATION IS NEEDED FOR ANY PURPOSE, NOTIFY LAB WITHIN 5 DAYS.  LOWEST DETECTABLE LIMITS FOR URINE DRUG SCREEN Drug Class                     Cutoff (ng/mL) Amphetamine and metabolites    1000 Barbiturate and metabolites    200 Benzodiazepine                 200 Tricyclics and metabolites     300 Opiates and metabolites        300 Cocaine and metabolites        300 THC                            50 Performed at [facility], [address], [place], [place] [address]    SARS Coronavirus 2 by RT PCR 06/14/2021 NEGATIVE  NEGATIVE Final   Comment: (NOTE) SARS-CoV-2 target nucleic acids are NOT DETECTED.  The SARS-CoV-2 RNA is generally detectable in upper respiratory specimens during the acute phase of infection. The lowest concentration of SARS-CoV-2 viral copies this assay can detect is 138 copies/mL. A negative result does not preclude SARS-Cov-2 infection and should not be used as the sole basis for treatment or other patient management decisions. A negative result may occur with  improper specimen collection/handling, submission of specimen other than nasopharyngeal swab, presence of viral mutation(s) within the areas targeted by this assay, and inadequate number of viral copies(<138 copies/mL). A negative result must be combined with clinical observations, patient history, and epidemiological information. The expected result is Negative.  Fact Sheet for Patients:  BloggerCourse.com  Fact Sheet for Healthcare Providers:  SeriousBroker.it  This test is no                          t yet approved or cleared by the [place] FDA and  has been authorized for detection and/or diagnosis of SARS-CoV-2 by FDA under an Emergency Use Authorization (EUA). This EUA will remain  in effect (meaning this test can be used) for the duration of the COVID-19 declaration under  Section 564(b)(1) of the Act, 21 U.S.C.section 360bbb-3(b)(1), unless  the authorization is terminated  or revoked sooner.       Influenza A by PCR 06/14/2021 NEGATIVE  NEGATIVE Final   Influenza B by PCR 06/14/2021 NEGATIVE  NEGATIVE Final   Comment: (NOTE) The Xpert Xpress SARS-CoV-2/FLU/RSV plus assay is intended as an aid in the diagnosis of influenza from Nasopharyngeal swab specimens and should not be used as a sole basis for treatment. Nasal washings and aspirates are unacceptable for Xpert Xpress SARS-CoV-2/FLU/RSV testing.  Fact Sheet for Patients: BloggerCourse.com  Fact Sheet for Healthcare Providers: SeriousBroker.it  This test is not yet approved or cleared by the [place] FDA and has been authorized for detection and/or diagnosis of SARS-CoV-2 by FDA under an Emergency Use Authorization (EUA). This EUA will remain in effect (meaning this test can be used) for the duration of the COVID-19 declaration under Section 564(b)(1) of the Act, 21 U.S.C. section 360bbb-3(b)(1), unless the authorization is terminated or revoked.  Performed at [facility], [address], [place], [place] [address]   Admission on 04/28/2021, Discharged on 05/05/2021  Component Date Value Ref Range Status   TSH 04/29/2021 0.392  0.350 - 4.500 uIU/mL Final   Comment: Performed by a 3rd Generation assay with a functional sensitivity of <=0.01 uIU/mL. Performed at [facility], [address], [place], [place] [address]    Hgb A1c MFr Bld 04/29/2021 5.4  4.8 - 5.6 % Final   Comment: (NOTE) Pre diabetes:          5.7%-6.4%  Diabetes:              >6.4%  Glycemic control for   <7.0% adults with diabetes    Mean Plasma Glucose 04/29/2021 108.28  mg/dL Final   Performed at [facility], [address], [place], [place] [address]   Cholesterol 04/29/2021 202 (H)  0 - 200 mg/dL Final   Triglycerides  04/29/2021 134  <150 mg/dL Final   HDL 84/16/6063 63  >40 mg/dL Final   Total CHOL/HDL Ratio 04/29/2021 3.2  RATIO Final   VLDL 04/29/2021 27  0 - 40 mg/dL Final   LDL Cholesterol 04/29/2021 112 (H)  0 - 99 mg/dL Final   Comment:        Total Cholesterol/HDL:CHD Risk Coronary Heart Disease Risk Table                     Men   Women  1/2 Average Risk   3.4   3.3  Average Risk       5.0   4.4  2 X Average Risk   9.6   7.1  3 X Average Risk  23.4   11.0        Use the calculated Patient Ratio above and the CHD Risk Table to determine the patient's CHD Risk.        ATP III CLASSIFICATION (LDL):  <100     mg/dL   Optimal  016-010  mg/dL   Near or Above                    Optimal  130-159  mg/dL   Borderline  932-355  mg/dL   High  >732     mg/dL   Very High Performed at [facility], [address] [place], [place] [address]    Sodium 04/30/2021 134 (L)  135 - 145 mmol/L Final   Potassium 04/30/2021 4.0  3.5 - 5.1 mmol/L Final  Chloride 04/30/2021 102  98 - 111 mmol/L Final   CO2 04/30/2021 23  22 - 32 mmol/L Final   Glucose, Bld 04/30/2021 112 (H)  70 - 99 mg/dL Final   Glucose reference range applies only to samples taken after fasting for at least 8 hours.   BUN 04/30/2021 16  6 - 20 mg/dL Final   Creatinine, Ser 04/30/2021 0.61  0.61 - 1.24 mg/dL Final   Calcium 09/81/1914 9.0  8.9 - 10.3 mg/dL Final   GFR, Estimated 04/30/2021 >60  >60 mL/min Final   Comment: (NOTE) Calculated using the CKD-EPI Creatinine Equation (2021)    Anion gap 04/30/2021 9  5 - 15 Final   Performed at [facility], [address], [place], [place] [address]   Sodium 05/02/2021 137  135 - 145 mmol/L Final   Potassium 05/02/2021 4.3  3.5 - 5.1 mmol/L Final   Chloride 05/02/2021 104  98 - 111 mmol/L Final   CO2 05/02/2021 25  22 - 32 mmol/L Final   Glucose, Bld 05/02/2021 102 (H)  70 - 99 mg/dL Final   Glucose reference range applies only to samples taken  after fasting for at least 8 hours.   BUN 05/02/2021 19  6 - 20 mg/dL Final   Creatinine, Ser 05/02/2021 0.65  0.61 - 1.24 mg/dL Final   Calcium 62/13/0865 9.2  8.9 - 10.3 mg/dL Final   GFR, Estimated 05/02/2021 >60  >60 mL/min Final   Comment: (NOTE) Calculated using the CKD-EPI Creatinine Equation (2021)    Anion gap 05/02/2021 8  5 - 15 Final   Performed at [facility], [address], [place], [place] [address]  Admission on 04/28/2021, Discharged on 04/28/2021  Component Date Value Ref Range Status   Sodium 04/28/2021 136  135 - 145 mmol/L Final   Potassium 04/28/2021 3.4 (L)  3.5 - 5.1 mmol/L Final   Chloride 04/28/2021 106  98 - 111 mmol/L Final   CO2 04/28/2021 22  22 - 32 mmol/L Final   Glucose, Bld 04/28/2021 108 (H)  70 - 99 mg/dL Final   Glucose reference range applies only to samples taken after fasting for at least 8 hours.   BUN 04/28/2021 13  6 - 20 mg/dL Final   Creatinine, Ser 04/28/2021 0.66  0.61 - 1.24 mg/dL Final   Calcium 62/95/2841 9.0  8.9 - 10.3 mg/dL Final   Total Protein 32/44/0102 7.4  6.5 - 8.1 g/dL Final   Albumin 72/53/6644 4.2  3.5 - 5.0 g/dL Final   AST 03/47/4259 32  15 - 41 U/L Final   ALT 04/28/2021 36  0 - 44 U/L Final   Alkaline Phosphatase 04/28/2021 81  38 - 126 U/L Final   Total Bilirubin 04/28/2021 0.3  0.3 - 1.2 mg/dL Final   GFR, Estimated 04/28/2021 >60  >60 mL/min Final   Comment: (NOTE) Calculated using the CKD-EPI Creatinine Equation (2021)    Anion gap 04/28/2021 8  5 - 15 Final   Performed at [facility], [address], [place], [place] [address]   Alcohol, Ethyl (B) 04/28/2021 136 (H)  <10 mg/dL Final   Comment: (NOTE) Lowest detectable limit for serum alcohol is 10 mg/dL.  For medical purposes only. Performed at [facility], [address] [place], [place] [address]    Salicylate Lvl 04/28/2021 <7.0 (L)  7.0 - 30.0 mg/dL Final   Performed at [facility], [address] [place],  [place] [address]   Acetaminophen (Tylenol), Serum 04/28/2021 <10 (L)  10 - 30 ug/mL Final   Comment: (NOTE) Therapeutic concentrations vary significantly. A range of 10-30 ug/mL  may be an effective concentration for many patients. However, some  are best treated at concentrations outside of this range. Acetaminophen concentrations >150 ug/mL at 4 hours after ingestion  and >50 ug/mL at 12 hours after ingestion are often associated with  toxic reactions.  Performed at [facility], [address], [place], [place] [address]    WBC 04/28/2021 8.2  4.0 - 10.5 K/uL Final   RBC 04/28/2021 4.35  4.22 - 5.81 MIL/uL Final   Hemoglobin 04/28/2021 12.6 (L)  13.0 - 17.0 g/dL Final   HCT 09/81/1914 38.3 (L)  39.0 - 52.0 % Final   MCV 04/28/2021 88.0  80.0 - 100.0 fL Final   MCH 04/28/2021 29.0  26.0 - 34.0 pg Final   MCHC 04/28/2021 32.9  30.0 - 36.0 g/dL Final   RDW 78/29/5621 13.8  11.5 - 15.5 % Final   Platelets 04/28/2021 329  150 - 400 K/uL Final   nRBC 04/28/2021 0.0  0.0 - 0.2 % Final   Performed at [facility], [address] [place], [place] [address]   Opiates 04/28/2021 NONE DETECTED  NONE DETECTED Final   Cocaine 04/28/2021 NONE DETECTED  NONE DETECTED Final   Benzodiazepines 04/28/2021 NONE DETECTED  NONE DETECTED Final   Amphetamines 04/28/2021 POSITIVE (A)  NONE DETECTED Final   Tetrahydrocannabinol 04/28/2021 NONE DETECTED  NONE DETECTED Final   Barbiturates 04/28/2021 NONE DETECTED  NONE DETECTED Final   Comment: (NOTE) DRUG SCREEN FOR MEDICAL PURPOSES ONLY.  IF CONFIRMATION IS NEEDED FOR ANY PURPOSE, NOTIFY LAB WITHIN 5 DAYS.  LOWEST DETECTABLE LIMITS FOR URINE DRUG SCREEN Drug Class                     Cutoff (ng/mL) Amphetamine and metabolites    1000 Barbiturate and metabolites    200 Benzodiazepine                 200 Tricyclics and metabolites     300 Opiates and metabolites         300 Cocaine and metabolites        300 THC                            50 Performed at [facility], [address] [place], [place] [address]    SARS Coronavirus 2 by RT PCR 04/28/2021 NEGATIVE  NEGATIVE Final   Comment: (NOTE) SARS-CoV-2 target nucleic acids are NOT DETECTED.  The SARS-CoV-2 RNA is generally detectable in upper respiratory specimens during the acute phase of infection. The lowest concentration of SARS-CoV-2 viral copies this assay can detect is 138 copies/mL. A negative result does not preclude SARS-Cov-2 infection and should not be used as the sole basis for treatment or other patient management decisions. A negative result may occur with  improper specimen collection/handling, submission of specimen other than nasopharyngeal swab, presence of viral mutation(s) within the areas targeted by this assay, and inadequate number of viral copies(<138 copies/mL). A negative result must be combined with clinical observations, patient history, and epidemiological information. The expected result is Negative.  Fact Sheet for Patients:  BloggerCourse.com  Fact Sheet for Healthcare Providers:  SeriousBroker.it  This test is no  t yet approved or cleared by the [place] and  has been authorized for detection and/or diagnosis of SARS-CoV-2 by FDA under an Emergency Use Authorization (EUA). This EUA will remain  in effect (meaning this test can be used) for the duration of the COVID-19 declaration under Section 564(b)(1) of the Act, 21 U.S.C.section 360bbb-3(b)(1), unless the authorization is terminated  or revoked sooner.       Influenza A by PCR 04/28/2021 NEGATIVE  NEGATIVE Final   Influenza B by PCR 04/28/2021 NEGATIVE  NEGATIVE Final   Comment: (NOTE) The Xpert Xpress SARS-CoV-2/FLU/RSV plus assay is intended as an aid in the diagnosis of influenza from  Nasopharyngeal swab specimens and should not be used as a sole basis for treatment. Nasal washings and aspirates are unacceptable for Xpert Xpress SARS-CoV-2/FLU/RSV testing.  Fact Sheet for Patients: BloggerCourse.com  Fact Sheet for Healthcare Providers: SeriousBroker.it  This test is not yet approved or cleared by the [place] FDA and has been authorized for detection and/or diagnosis of SARS-CoV-2 by FDA under an Emergency Use Authorization (EUA). This EUA will remain in effect (meaning this test can be used) for the duration of the COVID-19 declaration under Section 564(b)(1) of the Act, 21 U.S.C. section 360bbb-3(b)(1), unless the authorization is terminated or revoked.  Performed at [facility], [address] [place], [place] [address]   Admission on 03/19/2021, Discharged on 03/20/2021  Component Date Value Ref Range Status   Sodium 03/19/2021 138  135 - 145 mmol/L Final   Potassium 03/19/2021 4.0  3.5 - 5.1 mmol/L Final   Chloride 03/19/2021 102  98 - 111 mmol/L Final   CO2 03/19/2021 29  22 - 32 mmol/L Final   Glucose, Bld 03/19/2021 141 (H)  70 - 99 mg/dL Final   Glucose reference range applies only to samples taken after fasting for at least 8 hours.   BUN 03/19/2021 16  6 - 20 mg/dL Final   Creatinine, Ser 03/19/2021 1.05  0.61 - 1.24 mg/dL Final   Calcium 60/45/4098 9.1  8.9 - 10.3 mg/dL Final   Total Protein 11/91/4782 6.4 (L)  6.5 - 8.1 g/dL Final   Albumin 95/62/1308 3.8  3.5 - 5.0 g/dL Final   AST 65/78/4696 30  15 - 41 U/L Final   ALT 03/19/2021 35  0 - 44 U/L Final   Alkaline Phosphatase 03/19/2021 75  38 - 126 U/L Final   Total Bilirubin 03/19/2021 0.8  0.3 - 1.2 mg/dL Final   GFR, Estimated 03/19/2021 >60  >60 mL/min Final   Comment: (NOTE) Calculated using the CKD-EPI Creatinine Equation (2021)    Anion gap 03/19/2021 7  5 - 15 Final   Performed at [facility],  [address], [place], [place] [address]   Alcohol, Ethyl (B) 03/19/2021 <10  <10 mg/dL Final   Comment: (NOTE) Lowest detectable limit for serum alcohol is 10 mg/dL.  For medical purposes only. Performed at [facility], [address] [place], [place] [address]    Salicylate Lvl 03/19/2021 <7.0 (L)  7.0 - 30.0 mg/dL Final   Performed at [facility], [address], [place], [place] [address]   Acetaminophen (Tylenol), Serum 03/19/2021 <10 (L)  10 - 30 ug/mL Final   Comment: (NOTE) Therapeutic concentrations vary significantly. A range of 10-30 ug/mL  may be an effective concentration for many patients. However, some  are best treated at concentrations outside of this range. Acetaminophen concentrations >150 ug/mL  at 4 hours after ingestion  and >50 ug/mL at 12 hours after ingestion are often associated with  toxic reactions.  Performed at [facility], [address] [place], [place] [address]    WBC 03/19/2021 7.3  4.0 - 10.5 K/uL Final   RBC 03/19/2021 4.32  4.22 - 5.81 MIL/uL Final   Hemoglobin 03/19/2021 12.4 (L)  13.0 - 17.0 g/dL Final   HCT 60/45/4098 38.6 (L)  39.0 - 52.0 % Final   MCV 03/19/2021 89.4  80.0 - 100.0 fL Final   MCH 03/19/2021 28.7  26.0 - 34.0 pg Final   MCHC 03/19/2021 32.1  30.0 - 36.0 g/dL Final   RDW 11/91/4782 13.4  11.5 - 15.5 % Final   Platelets 03/19/2021 220  150 - 400 K/uL Final   nRBC 03/19/2021 0.0  0.0 - 0.2 % Final   Performed at [facility], [address], [place], [place] [address]   Glucose-Capillary 03/19/2021 95  70 - 99 mg/dL Final   Glucose reference range applies only to samples taken after fasting for at least 8 hours.  Admission on 03/14/2021, Discharged on 03/14/2021  Component Date Value Ref Range Status   SARS Coronavirus 2 by RT PCR 03/14/2021 NEGATIVE  NEGATIVE Final   Comment: (NOTE) SARS-CoV-2 target nucleic acids are NOT DETECTED.  The SARS-CoV-2 RNA is generally detectable in upper  respiratory specimens during the acute phase of infection. The lowest concentration of SARS-CoV-2 viral copies this assay can detect is 138 copies/mL. A negative result does not preclude SARS-Cov-2 infection and should not be used as the sole basis for treatment or other patient management decisions. A negative result may occur with  improper specimen collection/handling, submission of specimen other than nasopharyngeal swab, presence of viral mutation(s) within the areas targeted by this assay, and inadequate number of viral copies(<138 copies/mL). A negative result must be combined with clinical observations, patient history, and epidemiological information. The expected result is Negative.  Fact Sheet for Patients:  BloggerCourse.com  Fact Sheet for Healthcare Providers:  SeriousBroker.it  This test is no                          t yet approved or cleared by the [place] FDA and  has been authorized for detection and/or diagnosis of SARS-CoV-2 by FDA under an Emergency Use Authorization (EUA). This EUA will remain  in effect (meaning this test can be used) for the duration of the COVID-19 declaration under Section 564(b)(1) of the Act, 21 U.S.C.section 360bbb-3(b)(1), unless the authorization is terminated  or revoked sooner.       Influenza A by PCR 03/14/2021 NEGATIVE  NEGATIVE Final   Influenza B by PCR 03/14/2021 NEGATIVE  NEGATIVE Final   Comment: (NOTE) The Xpert Xpress SARS-CoV-2/FLU/RSV plus assay is intended as an aid in the diagnosis of influenza from Nasopharyngeal swab specimens and should not be used as a sole basis for treatment. Nasal washings and aspirates are unacceptable for Xpert Xpress SARS-CoV-2/FLU/RSV testing.  Fact Sheet for Patients: BloggerCourse.com  Fact Sheet for Healthcare Providers: SeriousBroker.it  This test is not yet approved or  cleared by the [place] FDA and has been authorized for detection and/or diagnosis of SARS-CoV-2 by FDA under an Emergency Use Authorization (EUA). This EUA will remain in effect (meaning this test can be used) for the duration of the COVID-19 declaration under Section 564(b)(1) of the Act, 21 U.S.C. section 360bbb-3(b)(1), unless the  authorization is terminated or revoked.  Performed at [facility], [address], [place], [place] [address]    Sodium 03/14/2021 139  135 - 145 mmol/L Final   Potassium 03/14/2021 4.5  3.5 - 5.1 mmol/L Final   Chloride 03/14/2021 104  98 - 111 mmol/L Final   CO2 03/14/2021 24  22 - 32 mmol/L Final   Glucose, Bld 03/14/2021 77  70 - 99 mg/dL Final   Glucose reference range applies only to samples taken after fasting for at least 8 hours.   BUN 03/14/2021 13  6 - 20 mg/dL Final   Creatinine, Ser 03/14/2021 0.70  0.61 - 1.24 mg/dL Final   Calcium 60/45/4098 9.8  8.9 - 10.3 mg/dL Final   Total Protein 11/91/4782 7.6  6.5 - 8.1 g/dL Final   Albumin 95/62/1308 4.4  3.5 - 5.0 g/dL Final   AST 65/78/4696 54 (H)  15 - 41 U/L Final   ALT 03/14/2021 66 (H)  0 - 44 U/L Final   Alkaline Phosphatase 03/14/2021 78  38 - 126 U/L Final   Total Bilirubin 03/14/2021 0.4  0.3 - 1.2 mg/dL Final   GFR, Estimated 03/14/2021 >60  >60 mL/min Final   Comment: (NOTE) Calculated using the CKD-EPI Creatinine Equation (2021)    Anion gap 03/14/2021 11  5 - 15 Final   Performed at [facility], [address] [place], [place] [address]   Alcohol, Ethyl (B) 03/14/2021 <10  <10 mg/dL Final   Comment: (NOTE) Lowest detectable limit for serum alcohol is 10 mg/dL.  For medical purposes only. Performed at [facility], [address] [place], [place] [address]    Opiates 03/14/2021 NONE DETECTED  NONE DETECTED Final   Cocaine 03/14/2021 NONE DETECTED  NONE DETECTED Final   Benzodiazepines 03/14/2021 NONE DETECTED   NONE DETECTED Final   Amphetamines 03/14/2021 NONE DETECTED  NONE DETECTED Final   Tetrahydrocannabinol 03/14/2021 NONE DETECTED  NONE DETECTED Final   Barbiturates 03/14/2021 NONE DETECTED  NONE DETECTED Final   Comment: (NOTE) DRUG SCREEN FOR MEDICAL PURPOSES ONLY.  IF CONFIRMATION IS NEEDED FOR ANY PURPOSE, NOTIFY LAB WITHIN 5 DAYS.  LOWEST DETECTABLE LIMITS FOR URINE DRUG SCREEN Drug Class                     Cutoff (ng/mL) Amphetamine and metabolites    1000 Barbiturate and metabolites    200 Benzodiazepine                 200 Tricyclics and metabolites     300 Opiates and metabolites        300 Cocaine and metabolites        300 THC                            50 Performed at [facility], [address] [place], [place] [address]    WBC 03/14/2021 7.2  4.0 - 10.5 K/uL Final   RBC 03/14/2021 5.25  4.22 - 5.81 MIL/uL Final   Hemoglobin 03/14/2021 15.2  13.0 - 17.0 g/dL Final   HCT 72/53/6644 47.9  39.0 - 52.0 % Final   MCV 03/14/2021 91.2  80.0 - 100.0 fL Final   MCH 03/14/2021 29.0  26.0 - 34.0 pg Final   MCHC 03/14/2021 31.7  30.0 - 36.0 g/dL Final   RDW 03/47/4259 13.8  11.5 - 15.5 % Final  Platelets 03/14/2021 266  150 - 400 K/uL Final   nRBC 03/14/2021 0.0  0.0 - 0.2 % Final   Neutrophils Relative % 03/14/2021 67  % Final   Neutro Abs 03/14/2021 4.8  1.7 - 7.7 K/uL Final   Lymphocytes Relative 03/14/2021 21  % Final   Lymphs Abs 03/14/2021 1.5  0.7 - 4.0 K/uL Final   Monocytes Relative 03/14/2021 9  % Final   Monocytes Absolute 03/14/2021 0.6  0.1 - 1.0 K/uL Final   Eosinophils Relative 03/14/2021 2  % Final   Eosinophils Absolute 03/14/2021 0.1  0.0 - 0.5 K/uL Final   Basophils Relative 03/14/2021 0  % Final   Basophils Absolute 03/14/2021 0.0  0.0 - 0.1 K/uL Final   Immature Granulocytes 03/14/2021 1  % Final   Abs Immature Granulocytes 03/14/2021 0.08 (H)  0.00 - 0.07 K/uL Final   Performed at [facility], [address] [place], [place] [address]   Acetaminophen (Tylenol), Serum 03/14/2021 <10 (L)  10 - 30 ug/mL Final   Comment: (NOTE) Therapeutic concentrations vary significantly. A range of 10-30 ug/mL  may be an effective concentration for many patients. However, some  are best treated at concentrations outside of this range. Acetaminophen concentrations >150 ug/mL at 4 hours after ingestion  and >50 ug/mL at 12 hours after ingestion are often associated with  toxic reactions.  Performed at [facility], [address] [place], [place] [address]    Salicylate Lvl 03/14/2021 <7.0 (L)  7.0 - 30.0 mg/dL Final   Performed at [facility], [address] [place], [place] [address]  Admission on 02/12/2021, Discharged on 02/18/2021  Component Date Value Ref Range Status   POC Amphetamine UR 02/12/2021 None Detected  NONE DETECTED (Cut Off Level 1000 ng/mL) Final   POC Secobarbital (BAR) 02/12/2021 None Detected  NONE DETECTED (Cut Off Level 300 ng/mL) Final   POC Buprenorphine (BUP) 02/12/2021 None Detected  NONE DETECTED (Cut Off Level 10 ng/mL) Final   POC Oxazepam (BZO) 02/12/2021 None Detected  NONE DETECTED (Cut Off Level 300 ng/mL) Final   POC Cocaine UR 02/12/2021 None Detected  NONE DETECTED (Cut Off Level 300 ng/mL) Final   POC Methamphetamine UR 02/12/2021 None Detected  NONE DETECTED (Cut Off Level 1000 ng/mL) Final   POC Morphine 02/12/2021 Positive (A)  NONE DETECTED (Cut Off Level 300 ng/mL) Final   POC Oxycodone UR 02/12/2021 None Detected  NONE DETECTED (Cut Off Level 100 ng/mL) Final   POC Methadone UR 02/12/2021 None Detected  NONE DETECTED (Cut Off Level 300 ng/mL) Final   POC Marijuana UR 02/12/2021 Positive (A)  NONE DETECTED (Cut Off Level 50 ng/mL) Final  Admission on 02/12/2021, Discharged on 02/12/2021  Component Date Value Ref Range Status   SARS Coronavirus 2 by RT PCR 02/12/2021 NEGATIVE  NEGATIVE Final   Comment:  (NOTE) SARS-CoV-2 target nucleic acids are NOT DETECTED.  The SARS-CoV-2 RNA is generally detectable in upper respiratory specimens during the acute phase of infection. The lowest concentration of SARS-CoV-2 viral copies this assay can detect is 138 copies/mL. A negative result does not preclude SARS-Cov-2 infection and should not be used as the sole basis for treatment or other patient management decisions. A negative result may occur with  improper specimen collection/handling, submission of specimen other than nasopharyngeal swab, presence of viral mutation(s) within the areas targeted by this assay, and inadequate number of viral copies(<138 copies/mL). A negative result must be combined with  clinical observations, patient history, and epidemiological information. The expected result is Negative.  Fact Sheet for Patients:  BloggerCourse.com  Fact Sheet for Healthcare Providers:  SeriousBroker.it  This test is no                          t yet approved or cleared by the [place] FDA and  has been authorized for detection and/or diagnosis of SARS-CoV-2 by FDA under an Emergency Use Authorization (EUA). This EUA will remain  in effect (meaning this test can be used) for the duration of the COVID-19 declaration under Section 564(b)(1) of the Act, 21 U.S.C.section 360bbb-3(b)(1), unless the authorization is terminated  or revoked sooner.       Influenza A by PCR 02/12/2021 NEGATIVE  NEGATIVE Final   Influenza B by PCR 02/12/2021 NEGATIVE  NEGATIVE Final   Comment: (NOTE) The Xpert Xpress SARS-CoV-2/FLU/RSV plus assay is intended as an aid in the diagnosis of influenza from Nasopharyngeal swab specimens and should not be used as a sole basis for treatment. Nasal washings and aspirates are unacceptable for Xpert Xpress SARS-CoV-2/FLU/RSV testing.  Fact Sheet for Patients: BloggerCourse.com  Fact  Sheet for Healthcare Providers: SeriousBroker.it  This test is not yet approved or cleared by the [place] FDA and has been authorized for detection and/or diagnosis of SARS-CoV-2 by FDA under an Emergency Use Authorization (EUA). This EUA will remain in effect (meaning this test can be used) for the duration of the COVID-19 declaration under Section 564(b)(1) of the Act, 21 U.S.C. section 360bbb-3(b)(1), unless the authorization is terminated or revoked.  Performed at [facility], [address], [place], [place] [address]    Sodium 02/12/2021 135  135 - 145 mmol/L Final   Potassium 02/12/2021 4.1  3.5 - 5.1 mmol/L Final   Chloride 02/12/2021 102  98 - 111 mmol/L Final   CO2 02/12/2021 28  22 - 32 mmol/L Final   Glucose, Bld 02/12/2021 92  70 - 99 mg/dL Final   Glucose reference range applies only to samples taken after fasting for at least 8 hours.   BUN 02/12/2021 13  6 - 20 mg/dL Final   Creatinine, Ser 02/12/2021 0.56 (L)  0.61 - 1.24 mg/dL Final   Calcium 98/05/9146 8.9  8.9 - 10.3 mg/dL Final   Total Protein 82/95/6213 6.7  6.5 - 8.1 g/dL Final   Albumin 08/65/7846 3.9  3.5 - 5.0 g/dL Final   AST 96/29/5284 19  15 - 41 U/L Final   ALT 02/12/2021 15  0 - 44 U/L Final   Alkaline Phosphatase 02/12/2021 75  38 - 126 U/L Final   Total Bilirubin 02/12/2021 0.6  0.3 - 1.2 mg/dL Final   GFR, Estimated 02/12/2021 >60  >60 mL/min Final   Comment: (NOTE) Calculated using the CKD-EPI Creatinine Equation (2021)    Anion gap 02/12/2021 5  5 - 15 Final   Performed at [facility], [address], [place], [place] [address]   Alcohol, Ethyl (B) 02/12/2021 <10  <10 mg/dL Final   Comment: (NOTE) Lowest detectable limit for serum alcohol is 10 mg/dL.  For medical purposes only. Performed at [facility], [address] [place], [place] [address]    WBC 02/12/2021 7.1  4.0 - 10.5 K/uL Final   RBC  02/12/2021 4.68  4.22 - 5.81 MIL/uL Final   Hemoglobin 02/12/2021 13.4  13.0 - 17.0 g/dL Final   HCT 25/36/6440 40.7  39.0 - 52.0 % Final   MCV 02/12/2021 87.0  80.0 - 100.0 fL Final   MCH 02/12/2021 28.6  26.0 - 34.0 pg Final   MCHC 02/12/2021 32.9  30.0 - 36.0 g/dL Final   RDW 16/04/9603 13.0  11.5 - 15.5 % Final   Platelets 02/12/2021 263  150 - 400 K/uL Final   nRBC 02/12/2021 0.0  0.0 - 0.2 % Final   Neutrophils Relative % 02/12/2021 73  % Final   Neutro Abs 02/12/2021 5.2  1.7 - 7.7 K/uL Final   Lymphocytes Relative 02/12/2021 18  % Final   Lymphs Abs 02/12/2021 1.3  0.7 - 4.0 K/uL Final   Monocytes Relative 02/12/2021 5  % Final   Monocytes Absolute 02/12/2021 0.4  0.1 - 1.0 K/uL Final   Eosinophils Relative 02/12/2021 3  % Final   Eosinophils Absolute 02/12/2021 0.2  0.0 - 0.5 K/uL Final   Basophils Relative 02/12/2021 1  % Final   Basophils Absolute 02/12/2021 0.1  0.0 - 0.1 K/uL Final   Immature Granulocytes 02/12/2021 0  % Final   Abs Immature Granulocytes 02/12/2021 0.03  0.00 - 0.07 K/uL Final   Performed at [facility], [address] [place], [place] [address]   Salicylate Lvl 02/12/2021 <7.0 (L)  7.0 - 30.0 mg/dL Final   Performed at [facility], [address] [place], [place] [address]   Acetaminophen (Tylenol), Serum 02/12/2021 <10 (L)  10 - 30 ug/mL Final   Comment: (NOTE) Therapeutic concentrations vary significantly. A range of 10-30 ug/mL  may be an effective concentration for many patients. However, some  are best treated at concentrations outside of this range. Acetaminophen concentrations >150 ug/mL at 4 hours after ingestion  and >50 ug/mL at 12 hours after ingestion are often associated with  toxic reactions.  Performed at [facility], [address], [place], [place] [address]   Admission on 02/08/2021, Discharged on 02/10/2021  Component Date Value Ref Range Status   SARS Coronavirus 2 by  RT PCR 02/08/2021 NEGATIVE  NEGATIVE Final   Comment: (NOTE) SARS-CoV-2 target nucleic acids are NOT DETECTED.  The SARS-CoV-2 RNA is generally detectable in upper respiratory specimens during the acute phase of infection. The lowest concentration of SARS-CoV-2 viral copies this assay can detect is 138 copies/mL. A negative result does not preclude SARS-Cov-2 infection and should not be used as the sole basis for treatment or other patient management decisions. A negative result may occur with  improper specimen collection/handling, submission of specimen other than nasopharyngeal swab, presence of viral mutation(s) within the areas targeted by this assay, and inadequate number of viral copies(<138 copies/mL). A negative result must be combined with clinical observations, patient history, and epidemiological information. The expected result is Negative.  Fact Sheet for Patients:  BloggerCourse.com  Fact Sheet for Healthcare Providers:  SeriousBroker.it  This test is no                          t yet approved or cleared by the [place] FDA and  has been authorized for detection and/or diagnosis of SARS-CoV-2 by FDA under an Emergency Use Authorization (EUA). This EUA will remain  in effect (meaning this test can be used) for the duration of the COVID-19 declaration under Section 564(b)(1) of the Act, 21 U.S.C.section 360bbb-3(b)(1), unless the authorization is terminated  or revoked sooner.       Influenza A by PCR  02/08/2021 NEGATIVE  NEGATIVE Final   Influenza B by PCR 02/08/2021 NEGATIVE  NEGATIVE Final   Comment: (NOTE) The Xpert Xpress SARS-CoV-2/FLU/RSV plus assay is intended as an aid in the diagnosis of influenza from Nasopharyngeal swab specimens and should not be used as a sole basis for treatment. Nasal washings and aspirates are unacceptable for Xpert Xpress SARS-CoV-2/FLU/RSV testing.  Fact Sheet for  Patients: BloggerCourse.com  Fact Sheet for Healthcare Providers: SeriousBroker.it  This test is not yet approved or cleared by the [place] FDA and has been authorized for detection and/or diagnosis of SARS-CoV-2 by FDA under an Emergency Use Authorization (EUA). This EUA will remain in effect (meaning this test can be used) for the duration of the COVID-19 declaration under Section 564(b)(1) of the Act, 21 U.S.C. section 360bbb-3(b)(1), unless the authorization is terminated or revoked.  Performed at [facility], [address], [place], [place] [address]    WBC 02/08/2021 6.5  4.0 - 10.5 K/uL Final   RBC 02/08/2021 5.15  4.22 - 5.81 MIL/uL Final   Hemoglobin 02/08/2021 14.9  13.0 - 17.0 g/dL Final   HCT 60/45/4098 44.9  39.0 - 52.0 % Final   MCV 02/08/2021 87.2  80.0 - 100.0 fL Final   MCH 02/08/2021 28.9  26.0 - 34.0 pg Final   MCHC 02/08/2021 33.2  30.0 - 36.0 g/dL Final   RDW 11/91/4782 13.0  11.5 - 15.5 % Final   Platelets 02/08/2021 330  150 - 400 K/uL Final   nRBC 02/08/2021 0.0  0.0 - 0.2 % Final   Neutrophils Relative % 02/08/2021 69  % Final   Neutro Abs 02/08/2021 4.5  1.7 - 7.7 K/uL Final   Lymphocytes Relative 02/08/2021 20  % Final   Lymphs Abs 02/08/2021 1.3  0.7 - 4.0 K/uL Final   Monocytes Relative 02/08/2021 7  % Final   Monocytes Absolute 02/08/2021 0.4  0.1 - 1.0 K/uL Final   Eosinophils Relative 02/08/2021 3  % Final   Eosinophils Absolute 02/08/2021 0.2  0.0 - 0.5 K/uL Final   Basophils Relative 02/08/2021 0  % Final   Basophils Absolute 02/08/2021 0.0  0.0 - 0.1 K/uL Final   Immature Granulocytes 02/08/2021 1  % Final   Abs Immature Granulocytes 02/08/2021 0.05  0.00 - 0.07 K/uL Final   Performed at [facility], [address] [place], [place] [address]   Sodium 02/08/2021 137  135 - 145 mmol/L Final   Potassium 02/08/2021 4.4  3.5 - 5.1 mmol/L Final   Chloride 02/08/2021 101  98 -  111 mmol/L Final   CO2 02/08/2021 26  22 - 32 mmol/L Final   Glucose, Bld 02/08/2021 75  70 - 99 mg/dL Final   Glucose reference range applies only to samples taken after fasting for at least 8 hours.   BUN 02/08/2021 12  6 - 20 mg/dL Final   Creatinine, Ser 02/08/2021 0.83  0.61 - 1.24 mg/dL Final   Calcium 30/86/5784 9.9  8.9 - 10.3 mg/dL Final   Total Protein 69/62/9528 6.9  6.5 - 8.1 g/dL Final   Albumin 41/32/4401 4.0  3.5 - 5.0 g/dL Final   AST 02/72/5366 19  15 - 41 U/L Final   ALT 02/08/2021 19  0 - 44 U/L Final   Alkaline Phosphatase 02/08/2021 87  38 - 126 U/L Final   Total Bilirubin 02/08/2021 0.4  0.3 - 1.2 mg/dL Final   GFR, Estimated 02/08/2021 >60  >60 mL/min Final  Comment: (NOTE) Calculated using the CKD-EPI Creatinine Equation (2021)    Anion gap 02/08/2021 10  5 - 15 Final   Performed at [facility], [address] [place], [place] [address]   Alcohol, Ethyl (B) 02/08/2021 <10  <10 mg/dL Final   Comment: (NOTE) Lowest detectable limit for serum alcohol is 10 mg/dL.  For medical purposes only. Performed at [facility], [address], [place], [place] [address]    POC Amphetamine UR 02/08/2021 None Detected  NONE DETECTED (Cut Off Level 1000 ng/mL) Final   POC Secobarbital (BAR) 02/08/2021 None Detected  NONE DETECTED (Cut Off Level 300 ng/mL) Final   POC Buprenorphine (BUP) 02/08/2021 None Detected  NONE DETECTED (Cut Off Level 10 ng/mL) Final   POC Oxazepam (BZO) 02/08/2021 None Detected  NONE DETECTED (Cut Off Level 300 ng/mL) Final   POC Cocaine UR 02/08/2021 None Detected  NONE DETECTED (Cut Off Level 300 ng/mL) Final   POC Methamphetamine UR 02/08/2021 None Detected  NONE DETECTED (Cut Off Level 1000 ng/mL) Final   POC Morphine 02/08/2021 None Detected  NONE DETECTED (Cut Off Level 300 ng/mL) Final   POC Oxycodone UR 02/08/2021 None Detected  NONE DETECTED (Cut Off Level 100 ng/mL) Final   POC Methadone UR 02/08/2021 None Detected  NONE  DETECTED (Cut Off Level 300 ng/mL) Final   POC Marijuana UR 02/08/2021 None Detected  NONE DETECTED (Cut Off Level 50 ng/mL) Final   SARSCOV2ONAVIRUS 2 AG 02/08/2021 NEGATIVE  NEGATIVE Final   Comment: (NOTE) SARS-CoV-2 antigen NOT DETECTED.   Negative results are presumptive.  Negative results do not preclude SARS-CoV-2 infection and should not be used as the sole basis for treatment or other patient management decisions, including infection  control decisions, particularly in the presence of clinical signs and  symptoms consistent with COVID-19, or in those who have been in contact with the virus.  Negative results must be combined with clinical observations, patient history, and epidemiological information. The expected result is Negative.  Fact Sheet for Patients: https://www.jennings-kim.com/  Fact Sheet for Healthcare Providers: https://alexander-rogers.biz/  This test is not yet approved or cleared by the [place] FDA and  has been authorized for detection and/or diagnosis of SARS-CoV-2 by FDA under an Emergency Use Authorization (EUA).  This EUA will remain in effect (meaning this test can be used) for the duration of  the COV                          ID-19 declaration under Section 564(b)(1) of the Act, 21 U.S.C. section 360bbb-3(b)(1), unless the authorization is terminated or revoked sooner.     SARS Coronavirus 2 Ag 02/08/2021 Negative  Negative Final   SARSCOV2ONAVIRUS 2 AG 02/08/2021 NEGATIVE  NEGATIVE Final   Comment: (NOTE) SARS-CoV-2 antigen NOT DETECTED.   Negative results are presumptive.  Negative results do not preclude SARS-CoV-2 infection and should not be used as the sole basis for treatment or other patient management decisions, including infection  control decisions, particularly in the presence of clinical signs and  symptoms consistent with COVID-19, or in those who have been in contact with the virus.  Negative results  must be combined with clinical observations, patient history, and epidemiological information. The expected result is Negative.  Fact Sheet for Patients: https://www.jennings-kim.com/  Fact Sheet for Healthcare Providers: https://alexander-rogers.biz/  This test is not yet approved or cleared by the [place] FDA and  has been authorized for detection  and/or diagnosis of SARS-CoV-2 by FDA under an Emergency Use Authorization (EUA).  This EUA will remain in effect (meaning this test can be used) for the duration of  the COV                          ID-19 declaration under Section 564(b)(1) of the Act, 21 U.S.C. section 360bbb-3(b)(1), unless the authorization is terminated or revoked sooner.      Blood Alcohol level:  Lab Results  Component Value Date   ETH <10 06/14/2021   ETH 136 (H) 04/28/2021    Metabolic Disorder Labs: Lab Results  Component Value Date   HGBA1C 5.4 04/29/2021   MPG 108.28 04/29/2021   MPG 105 12/03/2020   No results found for: PROLACTIN Lab Results  Component Value Date   CHOL 146 06/14/2021   TRIG 54 06/14/2021   HDL 49 06/14/2021   CHOLHDL 3.0 06/14/2021   VLDL 11 06/14/2021   LDLCALC 86 06/14/2021   LDLCALC 112 (H) 04/29/2021    Therapeutic Lab Levels: No results found for: LITHIUM No results found for: VALPROATE No components found for:  CBMZ  Physical Findings   AIMS    Flowsheet Row [facility]) [facility] Ad[facility] (Discharged) [facility] Admission (Discharged[facility]  AIMS Total Score 0 0 0      AUDIT    Flowsheet Row Ad[facility]) [facility] Admission (Discharged) [facility] Admission (Discharged) [facility]21[facility]2019  [facility]  Alcohol Use Disorder Identification Test Final Score (AUDIT) GAD-7    Flowsheet Row Office Visit from 06/04/2021 in [facility] Office Visit from 05/20/2021 in [facility]  Total GAD-7 Score 1 0      PHQ2-9    Flowsheet Row ED from 06/13/2021 in [facility] EMERGENCY DEPARTMENT Office Visit from 06/04/2021 in [facility] Office Visit from 05/20/2021 in [facility] ED from 04/28/2021 in [facility] ED from 11/30/2020 in [facility] EMERGENCY DEPARTMENT  PHQ-2 Total Score 4 1 0 5 5  PHQ-9 Total Score 6 1 0 21 26      Flowsheet Row ED from 06/14/2021 in [facility] ED from 06/13/2021 in [facility] EMERGENCY DEPARTMENT Admission (Discharged) [facility]  C-SSRS RISK CATEGORY Error: Q3, 4, or 5 should not be populated when Q2 is No High Risk High Risk        Musculoskeletal  Strength & Muscle Tone: within normal limits Gait & Station: normal Patient leans: N/A  Psychiatric Specialty Exam  Presentation  General Appearance: Appropriate for Environment; Casual  Eye Contact:Fair  Speech:Clear and Coherent; Normal Rate  Speech Volume:Normal  Handedness:Right   Mood and Affect  Mood:-- ("good")  Affect:Appropriate; Congruent   Thought Process  Thought Processes:Coherent; Goal Directed; Linear  Descriptions of Associations:Intact  Orientation:Full (Time, Place and Person)  Thought Content:WDL; Logical  Diagnosis of Schizophrenia or Schizoaffective disorder in past: No    Hallucinations:Hallucinations: None  Ideas of Reference:None  Suicidal Thoughts:Suicidal Thoughts: No  Homicidal Thoughts:Homicidal Thoughts:  No   Sensorium  Memory:Immediate Good; Recent Good; Remote Good  Judgment:Good  Insight:Fair   Executive Functions  Concentration:Good  Attention Span:Good  Recall:Good  Fund of Knowledge:Good  Language:Good   Psychomotor Activity  Psychomotor Activity:Psychomotor Activity: Normal   Assets  Assets:Communication Skills; Desire for Improvement; Leisure Time; Resilience; Physical Health   Sleep  Sleep:Sleep: Good   No data recorded  Physical Exam  Physical Exam Constitutional:      Appearance: Normal appearance. He is normal weight.  HENT:     Head: Normocephalic and atraumatic.  Eyes:     Extraocular Movements: Extraocular movements intact.     Conjunctiva/sclera: Conjunctivae normal.  Cardiovascular:     Rate and Rhythm: Normal rate and regular rhythm.     Heart sounds: Normal heart sounds.  Pulmonary:     Effort: Pulmonary effort is normal.     Breath sounds: Normal breath sounds.  Abdominal:     General: Abdomen is flat. Bowel sounds are normal.     Palpations: Abdomen is soft.  Neurological:     General: No focal deficit present.     Mental Status: He is alert and oriented to person, place, and time.  Psychiatric:        Attention and Perception: Attention and perception normal.        Speech: Speech normal.        Behavior: Behavior normal. Behavior is cooperative.        Thought Content: Thought content normal.   Review of Systems  Constitutional:  Negative for chills and fever.  HENT:  Negative for hearing loss.   Eyes:  Negative for discharge and redness.  Respiratory:  Negative for cough.   Cardiovascular:  Negative for chest pain.  Gastrointestinal:  Negative for abdominal pain.  Musculoskeletal:  Negative for myalgias.  Neurological:  Negative for headaches.  Psychiatric/Behavioral:  Positive for substance abuse. Negative for depression, hallucinations and suicidal ideas. The patient is not nervous/anxious.   Blood pressure 131/85,  pulse 78, temperature (!) 97.5 F (36.4 C), temperature source Tympanic, resp. rate 16, SpO2 99 %. There is no height or weight on file to calculate BMI.  Treatment Plan Summary: 30 year old male with past psychiatric history of MDD, opioid use disorder, polysubstance use who presented to [facility], ED for suicidal ideation on 12/10.  Patient was transferred to [facility] for continued treatment on this date.  Patient was restarted on his home medication of Effexor 75 mg. UDS+amphetamine. Etoh neg.  Today patient denies SI/HI/AVH and requested interest in going to residential rehab.  Social work assisting with this; no placement has been found as of yet.  Patient remains appropriate for continued treatment in the [facility] for ongoing mood stabilization. Anticipate discharge tomorrow-patient contacting family to see if he can stay with them so that he is able to follow up with residential rehab referrals once discharged.  MDD -continue venlafaxine 75 mg  Anxiety -continue prn vistaril  Dispo: ongoing.  Anticipate discharge tomorrow.  SW assisting-pt with interest in residential rehab treatment.  Estella Husk, MD 06/17/2021 4:22 PM

## 2021-06-17 NOTE — ED Provider Notes (Signed)
[name]/OBS ASAP Discharge Summary  Date and Time: 06/17/2021 4:45 PM  Name: Jared Jordan  MRN:  [id number]   Discharge Diagnoses:  Final diagnoses:  Opioid use disorder  MDD (major depressive disorder), recurrent severe, without psychosis (HCC)  Suicidal ideation    Subjective:  30 year old male with past psychiatric history of MDD, opioid use disorder, polysubstance use who presented to [facility], ED for suicidal ideation on 12/10.  Patient was transferred to [facility] for continued treatment on this date.  Patient was restarted on his home medication of Effexor 75 mg. UDS+amphetamine. Etoh neg.   Patient seen and chart reviewed.  Patient has been medication compliant and been appropriate with staff and peers on the unit.  Patient found laying in bed in no acute distress.  Patient reports his mood as "good".  Affect is bright.  He denies SI/HI/AVH.  Patient continues to express interest in going straight from the [facility] to residential rehab.  Discussed with patient that social work is continuing to seek placement in residential rehab; however, it may not be possible to obtain placement to go directly from the [facility] to rehab and inquired about what his plans would be if this were the case.  Patient indicates that he would go to a shelter and that "I end up or end up".  When asked if he has any friends or family that he is able to stay with, patient stated that he would not be able to stay with his family as they do not live in town.  Informed patient that social work will be by to speak with him regarding any updates and to discuss further discharge planning.  Patient verbalized understanding and expressed gratitude for help he has received all the [name].Patient denies all physical complaints today.  Shortly after interview, patient stated that he contacted his mother who was agreeable to pay for a room for him to stay in while he contacted [facility] daily to inquire about availability for beds for substance use.  Patient stated that he does not feel like he needed to take antidepressants due to improvement in mood and "being in a better place". Recommended that patient continue medications and to follow up with outpatient provider regarding medications-patient verbalized understanding.   Stay Summary:  30 year old male with past psychiatric history of MDD, opioid use disorder, polysubstance use who presented to [facility], ED for suicidal ideation on 12/10 after leaving [name] prematurely and relapsing on substances.  Patient was transferred to [facility] for continued treatment on this date. UDS+amphetamine. Etoh neg. upon arrival to the [facility] on 12/10 patient was restarted on his home Effexor 75 mg daily.  Patient was medication compliant throughout his stay, was appropriate with staff and peers on the unit and attended groups.  On 12/13 patient requested discharge-he denied SI/HI/AVH since his arrival at the [facility].  He stated that his mother was going to pay for a place for him to stay while he called our daily to follow-up on bed availability for substance use treatment.     Emotional and mental status was monitored by daily by clinical staff daily throughout his stay. Upon completion of this admission the [name] was both mentally and medically stable for discharge denying suicidal/homicidal ideation, symptoms that would be consistent with psychosis (AVH, IOR, paranoia, etc).   On my interview today, day of discharge, , patient is in NAD, alert, oriented, calm, cooperative, and attentive, with normal affect, speech, and behavior. Objectively, there is no evidence of psychosis/ mania (  able to converse coherently, linear and goal directed thought, no RIS, no distractibility, not pre-occupied, no FOI, etc) nor depression to the point of suicidality (able to concentrate, affect full and reactive, speech normal r/v/t, no psychomotor retardation/agitation, etc).  Overall, patient appears to be at the point, in the  absence of inhibiting or disinhibiting symptoms, where he can successfully move to lesser restrictive setting for care.    Total Time spent with patient: 15 minutes  Past Psychiatric History: MDD, substance use Past Medical History:  Past Medical History:  Diagnosis Date   Anxiety    Depression    Drug use     Past Surgical History:  Procedure Laterality Date   SHOULDER SURGERY     SHOULDER SURGERY Left 2020   Family History: No family history on file. Family Psychiatric History: denies Social History:  Social History   Substance and Sexual Activity  Alcohol Use Not Currently   Alcohol/week: 4.0 - 6.0 standard drinks   Types: 4 - 6 Cans of beer per week   Comment: normally, 4-6 beers weekly; last drank 2 bottles of champagne yesterday     Social History   Substance and Sexual Activity  Drug Use Not Currently   Types: IV, Heroin, Marijuana, Other-see comments   Comment: using 1gm everyday heroin - for past 3 weeks    Social History   Socioeconomic History   Marital status: Single    Spouse name: Not on file   Number of children: Not on file   Years of education: Not on file   Highest education level: Not on file  Occupational History   Not on file  Tobacco Use   Smoking status: Former    Packs/day: 0.25    Years: 1.00    Pack years: 0.25    Types: Cigarettes   Smokeless tobacco: Former    Quit date: 04/15/2021  Vaping Use   Vaping Use: Some days   Start date: 11/04/2018   Devices: Jewel Menthol  Substance and Sexual Activity   Alcohol use: Not Currently    Alcohol/week: 4.0 - 6.0 standard drinks    Types: 4 - 6 Cans of beer per week    Comment: normally, 4-6 beers weekly; last drank 2 bottles of champagne yesterday   Drug use: Not Currently    Types: IV, Heroin, Marijuana, Other-see comments    Comment: using 1gm everyday heroin - for past 3 weeks   Sexual activity: Not Currently  Other Topics Concern   Not on file  Social History Narrative   Pt is  grieving the death of his father last year. 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 Lives alone and counts his mom as social support. Has siblings but doesn't speak to them.   Social Determinants of Health   Financial Resource Strain: Not on file  Food Insecurity: Not on file  Transportation Needs: Not on file  Physical Activity: Not on file  Stress: Not on file  Social Connections: Not on file   SDOH:  SDOH Screenings   Alcohol Screen: Medium Risk   Last Alcohol Screening Score (AUDIT): 9  Depression (PHQ2-9): Medium Risk   PHQ-2 Score: 6  Financial Resource Strain: Not on file  Food Insecurity: Not on file  Housing: Not on file  Physical Activity: Not on file  Social Connections: Not on file  Stress: Not on file  Tobacco Use: Medium Risk   Smoking Tobacco Use: Former   Smokeless Tobacco Use: Former  Passive Exposure: Not on file  Transportation Needs: Not on file    Tobacco Cessation:  N/A, patient does not currently use tobacco products  Current Medications:  Current Facility-Administered Medications  Medication Dose Route Frequency Provider Last Rate Last Admin   acetaminophen (TYLENOL) tablet 650 mg  650 mg Oral Q6H PRN Bobbitt, Shalon E, NP       alum & mag hydroxide-simeth (MAALOX/MYLANTA) 200-200-20 MG/5ML suspension 30 mL  30 mL Oral Q4H PRN Bobbitt, Shalon E, NP       magnesium hydroxide (MILK OF MAGNESIA) suspension 30 mL  30 mL Oral Daily PRN Bobbitt, Shalon E, NP       multivitamin with minerals tablet 1 tablet  1 tablet Oral Daily Bobbitt, Shalon E, NP   1 tablet at 06/17/21 0946   thiamine tablet 100 mg  100 mg Oral Daily Bobbitt, Shalon E, NP   100 mg at 06/17/21 0946   traZODone (DESYREL) tablet 50 mg  50 mg Oral QHS PRN Bobbitt, Shalon E, NP   50 mg at 06/15/21 2106   venlafaxine XR (EFFEXOR-XR) 24 hr capsule 75 mg  75 mg Oral Q breakfast White, Patrice L, NP   75 mg at 06/17/21 2197   Current Outpatient Medications  Medication Sig Dispense  Refill   traZODone (DESYREL) 50 MG tablet Take 1 tablet (50 mg total) by mouth at bedtime as needed for sleep. 30 tablet 1    PTA Medications: (Not in a hospital admission)   Musculoskeletal  Strength & Muscle Tone: within normal limits Gait & Station: normal Patient leans: N/A  Psychiatric Specialty Exam  Presentation  General Appearance: Appropriate for Environment; Casual  Eye Contact:Fair  Speech:Clear and Coherent; Normal Rate  Speech Volume:Normal  Handedness:Right   Mood and Affect  Mood:-- ("good")  Affect:Appropriate; Congruent   Thought Process  Thought Processes:Coherent; Goal Directed; Linear  Descriptions of Associations:Intact  Orientation:Full (Time, Place and Person)  Thought Content:WDL; Logical  Diagnosis of Schizophrenia or Schizoaffective disorder in past: No    Hallucinations:Hallucinations: None  Ideas of Reference:None  Suicidal Thoughts:Suicidal Thoughts: No  Homicidal Thoughts:Homicidal Thoughts: No   Sensorium  Memory:Immediate Good; Recent Good; Remote Good  Judgment:Good  Insight:Fair   Executive Functions  Concentration:Good  Attention Span:Good  Recall:Good  Fund of Knowledge:Good  Language:Good   Psychomotor Activity  Psychomotor Activity:Psychomotor Activity: Normal   Assets  Assets:Communication Skills; Desire for Improvement; Leisure Time; Resilience; Physical Health   Sleep  Sleep:Sleep: Good   No data recorded  Physical Exam   Physical Exam Constitutional:      Appearance: Normal appearance. He is normal weight.  HENT:     Head: Normocephalic and atraumatic.  Eyes:     Extraocular Movements: Extraocular movements intact.     Conjunctiva/sclera: Conjunctivae normal.  Cardiovascular:     Rate and Rhythm: Normal rate and regular rhythm.     Heart sounds: Normal heart sounds.  Pulmonary:     Effort: Pulmonary effort is normal.     Breath sounds: Normal breath sounds.  Abdominal:      General: Abdomen is flat. Bowel sounds are normal.     Palpations: Abdomen is soft.  Neurological:     General: No focal deficit present.     Mental Status: He is alert and oriented to person, place, and time.  Psychiatric:        Attention and Perception: Attention and perception normal.        Speech: Speech normal.  Behavior: Behavior normal. Behavior is cooperative.        Thought Content: Thought content normal.    Review of Systems  Constitutional:  Negative for chills and fever.  HENT:  Negative for hearing loss.   Eyes:  Negative for discharge and redness.  Respiratory:  Negative for cough.   Cardiovascular:  Negative for chest pain.  Gastrointestinal:  Negative for abdominal pain.  Musculoskeletal:  Negative for myalgias.  Neurological:  Negative for headaches.  Psychiatric/Behavioral:  Positive for substance abuse. Negative for depression, hallucinations and suicidal ideas. The patient is not nervous/anxious.     Blood pressure 131/85, pulse 78, temperature (!) 97.5 F (36.4 C), temperature source Tympanic, resp. rate 16, SpO2 99 %. There is no height or weight on file to calculate BMI.  Demographic Factors:  Male, Caucasian, Low socioeconomic status, and Unemployed  Loss Factors: Recent relapse after leaving [name]  Historical Factors: Prior suicide attempts, Impulsivity, and previous psychiatric dx and treatments, substance use prior to admission  Risk Reduction Factors:   Sense of responsibility to family, Positive social support, and Positive coping skills or problem solving skills  Continued Clinical Symptoms:  Depression:   Recent sense of peace/wellbeing Alcohol/Substance Abuse/Dependencies Previous Psychiatric Diagnoses and Treatments Cognitive Features That Contribute To Risk:  None    Suicide Risk:  Minimal: No identifiable suicidal ideation.  Patients presenting with no risk factors but with morbid ruminations; may be classified as minimal  risk based on the severity of the depressive symptoms  Plan Of Care/Follow-up recommendations:  Activity:  as tolerated Diet:  regular Other:    Take all medications as prescribed by his/her mental healthcare provider. Report any adverse effects and or reactions from the medicines to your outpatient provider promptly. Do not engage in alcohol and or illegal drug use while on prescription medicines. In the event of worsening symptoms, call the crisis hotline, 911 and or go to the nearest ED for appropriate evaluation and treatment of symptoms. follow-up with your primary care provider for your other medical issues, concerns and or health care needs.  Allergies as of 06/17/2021   No Known Allergies      Medication List     STOP taking these medications    traZODone 50 MG tablet Commonly known as: DESYREL       TAKE these medications    venlafaxine XR 75 MG 24 hr capsule Commonly known as: EFFEXOR-XR Take 1 capsule (75 mg total) by mouth daily with breakfast. Start taking on: June 18, 2021        Patient provided a printed 30-day prescription for venlafaxine at time of discharge.     Disposition: self care  Estella Husk, MD 06/17/2021, 4:45 PM

## 2021-06-17 NOTE — ED Notes (Signed)
Pt is currently sleeping, no distress noted, environmental check complete, will continue to monitor patient for safety. ? ?

## 2021-06-17 NOTE — ED Notes (Signed)
Patient discharged to home, denies SI, HI and AVH. Patient given After Visit Summary with follow up information and paper prescription. Patient left in an UBER.

## 2021-06-17 NOTE — Progress Notes (Signed)
Patient resting in bed with eyes closed and respirations are unlabored and even. No objective signs of acute distress. Nursing staff will continue to monitor.

## 2021-06-17 NOTE — Progress Notes (Signed)
Patient is alert and oriented X 3, denies SI, HI and AVH.  Pain 0/10. Patient received scheduled morning medication and ate breakfast. Patient also attended morning 10 am group. Patient is pleasant, interacts appropriately on the unit with peers and staff. Patient can be seen laughing and smiling on the unit. No objective signs of patient responding to internal stimulus, no self injurious behaviors at this time. Nursing staff will continue to monitor.

## 2021-06-17 NOTE — ED Notes (Signed)
Pt is in the room sleeping. Respirations are even and unlabored. No acute distress noted will continue to monitor for safety.

## 2021-06-19 ENCOUNTER — Other Ambulatory Visit: Payer: Self-pay

## 2021-06-19 ENCOUNTER — Encounter (HOSPITAL_COMMUNITY): Payer: Self-pay | Admitting: Emergency Medicine

## 2021-06-19 ENCOUNTER — Emergency Department (HOSPITAL_COMMUNITY): Payer: Self-pay

## 2021-06-19 ENCOUNTER — Emergency Department (HOSPITAL_COMMUNITY)
Admission: EM | Admit: 2021-06-19 | Discharge: 2021-06-19 | Disposition: A | Discharge status: Home / Self Care | Payer: Self-pay | Attending: Emergency Medicine | Admitting: Emergency Medicine

## 2021-06-19 DIAGNOSIS — W108XXA Fall (on) (from) other stairs and steps, initial encounter: Secondary | ICD-10-CM | POA: Insufficient documentation

## 2021-06-19 DIAGNOSIS — S0083XA Contusion of other part of head, initial encounter: Secondary | ICD-10-CM | POA: Insufficient documentation

## 2021-06-19 DIAGNOSIS — Z87891 Personal history of nicotine dependence: Secondary | ICD-10-CM | POA: Insufficient documentation

## 2021-06-19 NOTE — ED Triage Notes (Signed)
Reports falling last night, tried to catch himself with his hands, face hit the wall. Bandages applied by pt to nose and L cheek bone area. Denies current pain. Dates he had some LOC, was evaluated by paramedics. Denies drug and alcohol use.

## 2021-06-19 NOTE — Discharge Instructions (Signed)
Return for any problem.  ?

## 2021-06-19 NOTE — ED Provider Notes (Signed)
[facility] DEPT Provider Note   CSN: [id number] Arrival date & time: 06/19/21  [id number]     History No chief complaint on file.   [facility] is a 30 y.o. male.  30 year old male with prior medical history as detailed below presents for evaluation.  Patient reports that yesterday evening he slipped while climbing some stairs.  He struck his left cheek against a concrete wall.  He may have had a very brief LOC.  He denies neck pain.  He complains of mild tenderness around the left cheek.  He does have an abrasion to the site.  He denies other injury.  He denies other complaint.   The history is provided by the patient.  Facial Injury Pain details:    Quality:  Aching   Severity:  Mild   Duration:  1 day   Timing:  Unable to specify   Progression:  Partially resolved Relieved by:  Nothing Worsened by:  Nothing     Past Medical History:  Diagnosis Date   Anxiety    Depression    Drug use     Patient Active Problem List   Diagnosis Date Noted   Opioid use disorder 06/14/2021   Psychophysiological insomnia 06/05/2021   Alcohol abuse 06/05/2021   Gastroesophageal reflux disease without esophagitis 06/05/2021   Opiate abuse, continuous ([place]) 04/29/2021   Hepatitis C 04/29/2021   MDD (major depressive disorder), recurrent episode ([place]) 03/14/2021   MDD (major depressive disorder), recurrent severe, 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  Social History   Tobacco Use   Smoking status: Former    Packs/day: 0.25    Years: 1.00    Pack years: 0.25    Types: Cigarettes   Smokeless  tobacco: Former    Quit date: 04/15/2021  Vaping Use   Vaping Use: Some days   Start date: 11/04/2018   Devices: Jewel Menthol  Substance Use Topics   Alcohol use: Not Currently    Alcohol/week: 4.0 - 6.0 standard drinks    Types: 4 - 6 Cans of beer per week    Comment: normally, 4-6 beers weekly; last drank 2 bottles of champagne yesterday   Drug use: Not Currently    Types: IV, Heroin, Marijuana, Other-see comments    Comment: using 1gm everyday heroin - for past 3 weeks    Home Medications Prior to Admission medications   Medication Sig Start Date End Date Taking? Authorizing Provider  venlafaxine XR (EFFEXOR-XR) 75 MG 24 hr capsule Take 1 capsule (75 mg total) by mouth daily with breakfast. 06/18/21   Ival Bible, MD    Allergies    Patient has no known allergies.  Review of Systems   Review of Systems  All other systems reviewed and are negative.  Physical Exam Updated Vital Signs BP (!) 144/89 (BP Location: Left Arm)    Pulse 91    Temp 98 F (36.7 C) (Oral)    Resp 16    Ht 5\' 10"  (1.778 m)    Wt 82 kg    SpO2 98%    BMI 25.94 kg/m   Physical Exam Vitals  and nursing note reviewed.  Constitutional:      General: He is not in acute distress.    Appearance: Normal appearance. He is well-developed.  HENT:     Head: Normocephalic.     Comments: Small abrasion to the left cheek with surrounding mild ecchymosis.    Nose: Nose normal. No congestion or rhinorrhea.     Comments: No epistaxis.  No hematoma.  No septal hematoma.    Mouth/Throat:     Mouth: Mucous membranes are moist.     Comments: Normal dentition.  No evidence of traumatic injury.  Normal jaw alignment. Eyes:     Conjunctiva/sclera: Conjunctivae normal.     Pupils: Pupils are equal, round, and reactive to light.  Cardiovascular:     Rate and Rhythm: Normal rate and regular rhythm.     Heart sounds: Normal heart sounds.  Pulmonary:     Effort: Pulmonary effort is normal. No respiratory  distress.     Breath sounds: Normal breath sounds.  Abdominal:     General: There is no distension.     Palpations: Abdomen is soft.     Tenderness: There is no abdominal tenderness.  Musculoskeletal:        General: No deformity. Normal range of motion.     Cervical back: Normal range of motion and neck supple.  Skin:    General: Skin is warm and dry.  Neurological:     General: No focal deficit present.     Mental Status: He is alert and oriented to person, place, and time.    ED Results / Procedures / Treatments   Labs (all labs ordered are listed, but only abnormal results are displayed) Labs Reviewed - No data to display  EKG None  Radiology CT Head Wo Contrast  Result Date: 06/19/2021 CLINICAL DATA:  Fall last night, struck face on wall, laceration to nose and left cheek EXAM: CT HEAD WITHOUT CONTRAST CT MAXILLOFACIAL WITHOUT CONTRAST CT CERVICAL SPINE WITHOUT CONTRAST TECHNIQUE: Multidetector CT imaging of the head, cervical spine, and maxillofacial structures were performed using the standard protocol without intravenous contrast. Multiplanar CT image reconstructions of the cervical spine and maxillofacial structures were also generated. COMPARISON:  03/19/2021 FINDINGS: CT HEAD FINDINGS Brain: No evidence of acute infarction, hemorrhage, hydrocephalus, extra-axial collection or mass lesion/mass effect. Vascular: No hyperdense vessel or unexpected calcification. CT FACIAL BONES FINDINGS Skull: Normal. Negative for fracture or focal lesion. Facial bones: No displaced fractures or dislocations. Sinuses/Orbits: No acute finding. Other: None. CT CERVICAL SPINE FINDINGS Alignment: Normal. Skull base and vertebrae: No acute fracture. No primary bone lesion or focal pathologic process. Soft tissues and spinal canal: No prevertebral fluid or swelling. No visible canal hematoma. Disc levels:  Intact. Upper chest: Negative. Other: None. IMPRESSION: 1. No acute intracranial pathology. 2. No  displaced fracture or dislocation of the facial bones. 3. No fracture or static subluxation of the cervical spine. Electronically Signed   By: Jearld Lesch M.D.   On: 06/19/2021 10:35   CT Cervical Spine Wo Contrast  Result Date: 06/19/2021 CLINICAL DATA:  Fall last night, struck face on wall, laceration to nose and left cheek EXAM: CT HEAD WITHOUT CONTRAST CT MAXILLOFACIAL WITHOUT CONTRAST CT CERVICAL SPINE WITHOUT CONTRAST TECHNIQUE: Multidetector CT imaging of the head, cervical spine, and maxillofacial structures were performed using the standard protocol without intravenous contrast. Multiplanar CT image reconstructions of the cervical spine and maxillofacial structures were also generated. COMPARISON:  03/19/2021 FINDINGS: CT HEAD FINDINGS Brain: No evidence  of acute infarction, hemorrhage, hydrocephalus, extra-axial collection or mass lesion/mass effect. Vascular: No hyperdense vessel or unexpected calcification. CT FACIAL BONES FINDINGS Skull: Normal. Negative for fracture or focal lesion. Facial bones: No displaced fractures or dislocations. Sinuses/Orbits: No acute finding. Other: None. CT CERVICAL SPINE FINDINGS Alignment: Normal. Skull base and vertebrae: No acute fracture. No primary bone lesion or focal pathologic process. Soft tissues and spinal canal: No prevertebral fluid or swelling. No visible canal hematoma. Disc levels:  Intact. Upper chest: Negative. Other: None. IMPRESSION: 1. No acute intracranial pathology. 2. No displaced fracture or dislocation of the facial bones. 3. No fracture or static subluxation of the cervical spine. Electronically Signed   By: Jearld LeschAlex D Bibbey M.D.   On: 06/19/2021 10:35   CT Maxillofacial Wo Contrast  Result Date: 06/19/2021 CLINICAL DATA:  Fall last night, struck face on wall, laceration to nose and left cheek EXAM: CT HEAD WITHOUT CONTRAST CT MAXILLOFACIAL WITHOUT CONTRAST CT CERVICAL SPINE WITHOUT CONTRAST TECHNIQUE: Multidetector CT imaging of the  head, cervical spine, and maxillofacial structures were performed using the standard protocol without intravenous contrast. Multiplanar CT image reconstructions of the cervical spine and maxillofacial structures were also generated. COMPARISON:  03/19/2021 FINDINGS: CT HEAD FINDINGS Brain: No evidence of acute infarction, hemorrhage, hydrocephalus, extra-axial collection or mass lesion/mass effect. Vascular: No hyperdense vessel or unexpected calcification. CT FACIAL BONES FINDINGS Skull: Normal. Negative for fracture or focal lesion. Facial bones: No displaced fractures or dislocations. Sinuses/Orbits: No acute finding. Other: None. CT CERVICAL SPINE FINDINGS Alignment: Normal. Skull base and vertebrae: No acute fracture. No primary bone lesion or focal pathologic process. Soft tissues and spinal canal: No prevertebral fluid or swelling. No visible canal hematoma. Disc levels:  Intact. Upper chest: Negative. Other: None. IMPRESSION: 1. No acute intracranial pathology. 2. No displaced fracture or dislocation of the facial bones. 3. No fracture or static subluxation of the cervical spine. Electronically Signed   By: Jearld LeschAlex D Bibbey M.D.   On: 06/19/2021 10:35    Procedures Procedures   Medications Ordered in ED Medications - No data to display  ED Course  I have reviewed the triage vital signs and the nursing notes.  Pertinent labs & imaging results that were available during my care of the patient were reviewed by me and considered in my medical decision making (see chart for details).    MDM Rules/Calculators/A&P                           MDM  MSE complete  [facility] was evaluated in Emergency Department on 06/19/2021 for the symptoms described in the history of present illness. He was evaluated in the context of the global COVID-19 pandemic, which necessitated consideration that the patient might be at risk for infection with the SARS-CoV-2 virus that causes COVID-19. Institutional  protocols and algorithms that pertain to the evaluation of patients at risk for COVID-19 are in a state of rapid change based on information released by regulatory bodies including the CDC and federal and state organizations. These policies and algorithms were followed during the patient's care in the ED.  Patient is presenting after accidental fall with injury to the left cheek.  CT imaging obtained is without evidence of significant traumatic injury.  Patient is reassured after ED evaluation.  Appropriate management of his injuries in the outpatient setting is discussed extensively.  Importance of close follow-up was stressed.  Strict return precautions given and understood. Final Clinical Impression(s) /  ED Diagnoses Final diagnoses:  Contusion of face, initial encounter    Rx / DC Orders ED Discharge Orders     None        Valarie Merino, MD 06/19/21 1133

## 2021-06-19 NOTE — ED Provider Notes (Signed)
Emergency Medicine Provider Triage Evaluation Note  [facility] , a 30 y.o. male  was evaluated in triage.  Pt complains of facial pain and swelling. The patient reported he slipped on stairs yesterday and landed his face on the side wall and reported LOC for a minute, but the patient is not sure. Denies any blurry vision.   Review of Systems  Positive: Facial pain Negative: Blurry vision, nosebleeds   Physical Exam  BP (!) 144/89 (BP Location: Left Arm)    Pulse 91    Temp 98 F (36.7 C) (Oral)    Resp 16    Ht 5\' 10"  (1.778 m)    Wt 82 kg    SpO2 98%    BMI 25.94 kg/m  Gen:   Awake, no distress   Resp:  Normal effort  MSK:   Moves extremities without difficulty  Other:  Abrasions to the left face with swelling. No obvious deformities or step offs.   Medical Decision Making  Medically screening exam initiated at 9:52 AM.  Appropriate orders placed.  Joanne Brander was informed that the remainder of the evaluation will be completed by another provider, this initial triage assessment does not replace that evaluation, and the importance of remaining in the ED until their evaluation is complete.  CT ordered.    Addison Lank, PA-C 06/19/21 06/21/21    [id number], MD 06/19/21 [phone]

## 2021-06-20 ENCOUNTER — Encounter (HOSPITAL_COMMUNITY): Payer: Self-pay

## 2021-06-20 ENCOUNTER — Emergency Department (HOSPITAL_COMMUNITY)
Admission: EM | Admit: 2021-06-20 | Discharge: 2021-06-21 | Disposition: A | Discharge status: Home / Self Care | Payer: Medicaid Other | Attending: Emergency Medicine | Admitting: Emergency Medicine

## 2021-06-20 DIAGNOSIS — Z87891 Personal history of nicotine dependence: Secondary | ICD-10-CM | POA: Insufficient documentation

## 2021-06-20 DIAGNOSIS — Z20822 Contact with and (suspected) exposure to covid-19: Secondary | ICD-10-CM | POA: Insufficient documentation

## 2021-06-20 DIAGNOSIS — F332 Major depressive disorder, recurrent severe without psychotic features: Secondary | ICD-10-CM | POA: Diagnosis present

## 2021-06-20 DIAGNOSIS — R45851 Suicidal ideations: Secondary | ICD-10-CM | POA: Insufficient documentation

## 2021-06-20 DIAGNOSIS — F1994 Other psychoactive substance use, unspecified with psychoactive substance-induced mood disorder: Secondary | ICD-10-CM | POA: Diagnosis present

## 2021-06-20 DIAGNOSIS — F1010 Alcohol abuse, uncomplicated: Secondary | ICD-10-CM | POA: Diagnosis present

## 2021-06-20 DIAGNOSIS — F32A Depression, unspecified: Secondary | ICD-10-CM | POA: Insufficient documentation

## 2021-06-20 LAB — CBC WITH DIFFERENTIAL/PLATELET
Abs Immature Granulocytes: 0.01 10*3/uL (ref 0.00–0.07)
Basophils Absolute: 0 10*3/uL (ref 0.0–0.1)
Basophils Relative: 0 %
Eosinophils Absolute: 0.1 10*3/uL (ref 0.0–0.5)
Eosinophils Relative: 1 %
HCT: 40.2 % (ref 39.0–52.0)
Hemoglobin: 13.6 g/dL (ref 13.0–17.0)
Immature Granulocytes: 0 %
Lymphocytes Relative: 21 %
Lymphs Abs: 0.9 10*3/uL (ref 0.7–4.0)
MCH: 29.2 pg (ref 26.0–34.0)
MCHC: 33.8 g/dL (ref 30.0–36.0)
MCV: 86.5 fL (ref 80.0–100.0)
Monocytes Absolute: 0.6 10*3/uL (ref 0.1–1.0)
Monocytes Relative: 14 %
Neutro Abs: 2.6 10*3/uL (ref 1.7–7.7)
Neutrophils Relative %: 64 %
Platelets: 239 10*3/uL (ref 150–400)
RBC: 4.65 MIL/uL (ref 4.22–5.81)
RDW: 13.2 % (ref 11.5–15.5)
WBC: 4.2 10*3/uL (ref 4.0–10.5)
nRBC: 0 % (ref 0.0–0.2)

## 2021-06-20 LAB — ETHANOL: Alcohol, Ethyl (B): <10 mg/dL (ref <10)

## 2021-06-20 LAB — COMPREHENSIVE METABOLIC PANEL
ALT: 262 U/L — ABNORMAL HIGH (ref 0–44)
AST: 463 U/L — ABNORMAL HIGH (ref 15–41)
Albumin: 3.6 g/dL (ref 3.5–5.0)
Alkaline Phosphatase: 76 U/L (ref 38–126)
Anion gap: 9 (ref 5–15)
BUN: 7 mg/dL (ref 6–20)
CO2: 25 mmol/L (ref 22–32)
Calcium: 9.2 mg/dL (ref 8.9–10.3)
Chloride: 101 mmol/L (ref 98–111)
Creatinine, Ser: 0.84 mg/dL (ref 0.61–1.24)
GFR, Estimated: >60 mL/min (ref >60)
Glucose, Bld: 114 mg/dL — ABNORMAL HIGH (ref 70–99)
Potassium: 3.7 mmol/L (ref 3.5–5.1)
Sodium: 135 mmol/L (ref 135–145)
Total Bilirubin: 0.5 mg/dL (ref 0.3–1.2)
Total Protein: 6.2 g/dL — ABNORMAL LOW (ref 6.5–8.1)

## 2021-06-20 LAB — RAPID URINE DRUG SCREEN, HOSP PERFORMED
Amphetamines: NOT DETECTED
Barbiturates: NOT DETECTED
Benzodiazepines: NOT DETECTED
Cocaine: NOT DETECTED
Opiates: NOT DETECTED
Tetrahydrocannabinol: NOT DETECTED

## 2021-06-20 LAB — RESP PANEL BY RT-PCR (FLU A&B, COVID) ARPGX2
Influenza A by PCR: NEGATIVE
Influenza B by PCR: NEGATIVE
SARS Coronavirus 2 by RT PCR: NEGATIVE
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
## 2021-06-20 NOTE — ED Provider Notes (Signed)
[facility] EMERGENCY DEPARTMENT Provider Note   CSN: [id number] Arrival date & time: 06/20/21  1817     History Chief Complaint  Patient presents with   Suicidal    Jared Jordan is a 30 y.o. male.  HPI  Patient with medical history notable for substance use disorder (heroin), depression, alcohol use disorder presents due to suicidal ideations.  He has felt this way off and on for the last year.  He does have.  Hands involving slitting his wrists.  His current plan would be to jump off a bridge.  Denies any HI, no hallucinations or delusions.  No drinks in the last week, also denies any heroin use over the last week and no withdrawal symptoms.  Promises mother he would get help, he feels hopeless.  Not currently on any medication because it does not help his symptoms.    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 No pertinent family history.  Social History   Tobacco Use   Smoking status: Former    Packs/day: 0.25    Years: 1.00    Pack years: 0.25    Types: Cigarettes   Smokeless tobacco: Former    Quit date: 04/15/2021  Vaping  Use   Vaping Use: Some days   Start date: 11/04/2018   Devices: Jewel Menthol  Substance Use Topics   Alcohol use: Not Currently    Alcohol/week: 4.0 - 6.0 standard drinks    Types: 4 - 6 Cans of beer per week    Comment: normally, 4-6 beers weekly; last drank 2 bottles of champagne yesterday   Drug use: Not Currently    Types: IV, Heroin, Marijuana, Other-see comments    Comment: using 1gm everyday heroin - for past 3 weeks    Home Medications Prior to Admission medications   Medication Sig Start Date End Date Taking? Authorizing Provider  venlafaxine XR (EFFEXOR-XR) 75 MG 24 hr capsule Take 1 capsule (75 mg total) by mouth daily with breakfast. 06/18/21   Ival Bible, MD    Allergies    Patient has no known allergies.  Review of Systems   Review of Systems  Constitutional:  Negative for chills and fever.  HENT:  Negative for ear pain and sore throat.   Eyes:  Negative for pain and visual disturbance.  Respiratory:  Negative for cough and shortness of breath.   Cardiovascular:  Negative for chest pain and palpitations.  Gastrointestinal:  Negative for abdominal pain and vomiting.  Genitourinary:  Negative for dysuria and hematuria.  Musculoskeletal:  Negative for arthralgias and back pain.  Skin:  Negative for color change and rash.  Neurological:  Negative  for seizures and syncope.  Psychiatric/Behavioral:  Positive for self-injury and suicidal ideas. Negative for behavioral problems, confusion and hallucinations.   All other systems reviewed and are negative.  Physical Exam Updated Vital Signs BP 124/82 (BP Location: Left Arm)    Pulse 95    Temp 97.9 F (36.6 C) (Oral)    Resp 18    Ht 5\' 10"  (1.778 m)    Wt 82 kg    SpO2 97%    BMI 25.94 kg/m   Physical Exam Vitals and nursing note reviewed. Exam conducted with a chaperone present.  Constitutional:      Appearance: Normal appearance.  HENT:     Head: Normocephalic and atraumatic.  Eyes:     General: No  scleral icterus.       Right eye: No discharge.        Left eye: No discharge.     Extraocular Movements: Extraocular movements intact.     Pupils: Pupils are equal, round, and reactive to light.  Cardiovascular:     Rate and Rhythm: Normal rate and regular rhythm.     Pulses: Normal pulses.     Heart sounds: Normal heart sounds. No murmur heard.   No friction rub. No gallop.  Pulmonary:     Effort: Pulmonary effort is normal. No respiratory distress.     Breath sounds: Normal breath sounds.  Abdominal:     General: Abdomen is flat. Bowel sounds are normal. There is no distension.     Palpations: Abdomen is soft.     Tenderness: There is no abdominal tenderness.  Skin:    General: Skin is warm and dry.     Coloration: Skin is not jaundiced.  Neurological:     Mental Status: He is alert. Mental status is at baseline.     Coordination: Coordination normal.  Psychiatric:        Attention and Perception: Attention and perception normal.        Mood and Affect: Mood is depressed. Affect is flat.        Speech: Speech normal.        Behavior: Behavior normal. Behavior is cooperative.        Thought Content: Thought content includes suicidal ideation. Thought content does not include homicidal ideation. Thought content includes suicidal plan.        Judgment: Judgment normal.   ED Results / Procedures / Treatments   Labs (all labs ordered are listed, but only abnormal results are displayed) Labs Reviewed - No data to display  EKG None  Radiology CT Head Wo Contrast  Result Date: 06/19/2021 CLINICAL DATA:  Fall last night, struck face on wall, laceration to nose and left cheek EXAM: CT HEAD WITHOUT CONTRAST CT MAXILLOFACIAL WITHOUT CONTRAST CT CERVICAL SPINE WITHOUT CONTRAST TECHNIQUE: Multidetector CT imaging of the head, cervical spine, and maxillofacial structures were performed using the standard protocol without intravenous contrast. Multiplanar CT image reconstructions of the  cervical spine and maxillofacial structures were also generated. COMPARISON:  03/19/2021 FINDINGS: CT HEAD FINDINGS Brain: No evidence of acute infarction, hemorrhage, hydrocephalus, extra-axial collection or mass lesion/mass effect. Vascular: No hyperdense vessel or unexpected calcification. CT FACIAL BONES FINDINGS Skull: Normal. Negative for fracture or focal lesion. Facial bones: No displaced fractures or dislocations. Sinuses/Orbits: No acute finding. Other: None. CT CERVICAL SPINE FINDINGS Alignment: Normal. Skull base and vertebrae: No acute fracture. No primary bone lesion or focal pathologic process. Soft tissues and spinal canal: No prevertebral fluid or swelling. No  visible canal hematoma. Disc levels:  Intact. Upper chest: Negative. Other: None. IMPRESSION: 1. No acute intracranial pathology. 2. No displaced fracture or dislocation of the facial bones. 3. No fracture or static subluxation of the cervical spine. Electronically Signed   By: Delanna Ahmadi M.D.   On: 06/19/2021 10:35   CT Cervical Spine Wo Contrast  Result Date: 06/19/2021 CLINICAL DATA:  Fall last night, struck face on wall, laceration to nose and left cheek EXAM: CT HEAD WITHOUT CONTRAST CT MAXILLOFACIAL WITHOUT CONTRAST CT CERVICAL SPINE WITHOUT CONTRAST TECHNIQUE: Multidetector CT imaging of the head, cervical spine, and maxillofacial structures were performed using the standard protocol without intravenous contrast. Multiplanar CT image reconstructions of the cervical spine and maxillofacial structures were also generated. COMPARISON:  03/19/2021 FINDINGS: CT HEAD FINDINGS Brain: No evidence of acute infarction, hemorrhage, hydrocephalus, extra-axial collection or mass lesion/mass effect. Vascular: No hyperdense vessel or unexpected calcification. CT FACIAL BONES FINDINGS Skull: Normal. Negative for fracture or focal lesion. Facial bones: No displaced fractures or dislocations. Sinuses/Orbits: No acute finding. Other: None. CT  CERVICAL SPINE FINDINGS Alignment: Normal. Skull base and vertebrae: No acute fracture. No primary bone lesion or focal pathologic process. Soft tissues and spinal canal: No prevertebral fluid or swelling. No visible canal hematoma. Disc levels:  Intact. Upper chest: Negative. Other: None. IMPRESSION: 1. No acute intracranial pathology. 2. No displaced fracture or dislocation of the facial bones. 3. No fracture or static subluxation of the cervical spine. Electronically Signed   By: Delanna Ahmadi M.D.   On: 06/19/2021 10:35   CT Maxillofacial Wo Contrast  Result Date: 06/19/2021 CLINICAL DATA:  Fall last night, struck face on wall, laceration to nose and left cheek EXAM: CT HEAD WITHOUT CONTRAST CT MAXILLOFACIAL WITHOUT CONTRAST CT CERVICAL SPINE WITHOUT CONTRAST TECHNIQUE: Multidetector CT imaging of the head, cervical spine, and maxillofacial structures were performed using the standard protocol without intravenous contrast. Multiplanar CT image reconstructions of the cervical spine and maxillofacial structures were also generated. COMPARISON:  03/19/2021 FINDINGS: CT HEAD FINDINGS Brain: No evidence of acute infarction, hemorrhage, hydrocephalus, extra-axial collection or mass lesion/mass effect. Vascular: No hyperdense vessel or unexpected calcification. CT FACIAL BONES FINDINGS Skull: Normal. Negative for fracture or focal lesion. Facial bones: No displaced fractures or dislocations. Sinuses/Orbits: No acute finding. Other: None. CT CERVICAL SPINE FINDINGS Alignment: Normal. Skull base and vertebrae: No acute fracture. No primary bone lesion or focal pathologic process. Soft tissues and spinal canal: No prevertebral fluid or swelling. No visible canal hematoma. Disc levels:  Intact. Upper chest: Negative. Other: None. IMPRESSION: 1. No acute intracranial pathology. 2. No displaced fracture or dislocation of the facial bones. 3. No fracture or static subluxation of the cervical spine. Electronically Signed    By: Delanna Ahmadi M.D.   On: 06/19/2021 10:35    Procedures Procedures   Medications Ordered in ED Medications - No data to display  ED Course  I have reviewed the triage vital signs and the nursing notes.  Pertinent labs & imaging results that were available during my care of the patient were reviewed by me and considered in my medical decision making (see chart for details).    MDM Rules/Calculators/A&P                         Patient is stable vitals, nontoxic-appearing.  Physical exam unremarkable.  Was seen yesterday for injury to the head, CT imaging reviewed and negative.  Patient mentating well.  Vital aspect  with suicidal ideations and plan, previous attempts in the past.  Patient is here voluntarily, do not think he needs to be IVC at this time.  We will medically clear and then plan for TTS evaluation.    Patient without any signs of withdrawal.  At this point I feel he is medically cleared and appropriate for TTS evaluation.     Final Clinical Impression(s) / ED Diagnoses Final diagnoses:  None    Rx / DC Orders ED Discharge Orders     None        Sherrill Raring, Hershal Coria 06/20/21 Dorthea Cove, MD 06/20/21 [phone]

## 2021-06-20 NOTE — ED Notes (Addendum)
Patient given a sandwich bag and sprite

## 2021-06-20 NOTE — ED Triage Notes (Signed)
Pt arrives POV for eval of suicidal thoughts. Pt reports plan to walk off bridge. States his mother advised him he needed to get help before he "did something stupid"

## 2021-06-20 NOTE — ED Notes (Signed)
TTS in process 

## 2021-06-20 NOTE — ED Notes (Signed)
Belongings placed in LOCKER #1 

## 2021-06-21 DIAGNOSIS — F332 Major depressive disorder, recurrent severe without psychotic features: Secondary | ICD-10-CM

## 2021-06-21 MED ORDER — ACETAMINOPHEN 650 MG RE SUPP: 650.0000 mg | Freq: Four times a day (QID) | RECTAL | Status: DC | PRN

## 2021-06-21 MED ORDER — GABAPENTIN 100 MG PO CAPS: 200.0000 mg | ORAL_CAPSULE | Freq: Three times a day (TID) | ORAL | Status: DC

## 2021-06-21 MED ORDER — VENLAFAXINE HCL ER 75 MG PO CP24
75.0000 mg | ORAL_CAPSULE | Freq: Every day | ORAL | Status: DC
  Filled 2021-06-21: qty 1

## 2021-06-21 MED ORDER — VENLAFAXINE HCL ER 75 MG PO CP24
75.0000 mg | ORAL_CAPSULE | Freq: Every day | ORAL | Status: DC
  Administered 2021-06-21: 75 mg via ORAL

## 2021-06-21 MED ORDER — TRAZODONE HCL 50 MG PO TABS: 50.0000 mg | ORAL_TABLET | Freq: Every evening | ORAL | Status: DC | PRN

## 2021-06-21 MED ORDER — POLYETHYLENE GLYCOL 3350 17 G PO PACK: 17.0000 g | PACK | Freq: Every day | ORAL | Status: DC | PRN

## 2021-06-21 MED ORDER — ACETAMINOPHEN 325 MG PO TABS: 650.0000 mg | ORAL_TABLET | Freq: Four times a day (QID) | ORAL | Status: DC | PRN

## 2021-06-21 NOTE — ED Provider Notes (Addendum)
Emergency Medicine Observation Re-evaluation Note  Jared Jordan is a 30 y.o. male, seen on rounds today.  Pt initially presented to the ED for complaints of Suicidal Currently, the patient is sleeping in bed.   Physical Exam  BP 119/88 (BP Location: Right Arm)    Pulse 82    Temp 97.7 F (36.5 C) (Oral)    Resp 20    Ht 5\' 10"  (1.778 m)    Wt 82 kg    SpO2 100%    BMI 25.94 kg/m  Physical Exam CONSTITUTIONAL:  well-appearing, NAD NEURO:   EYES:   ENT/NECK:  trachea midline, no JVD CARDIO:   PULM:  None labored breathing GI/GU:  Abdomen non-distended MSK/SPINE:  No gross deformities SKIN:  no rash obvious, atraumatic, no ecchymosis  PSYCH:  asleep   ED Course / MDM  EKG:   I have reviewed the labs performed to date as well as medications administered while in observation.  Recent changes in the last 24 hours include none.  Plan  Current plan is for observation w psych to see 12/17 (today).   Jared Jordan is not under involuntary commitment.     Addison Lank, [facility] 06/21/21 0720   TTS has evaluated patient and is recommending patient be seen at [facility] Will DC patient for transport to [facility] by safe-transport   [facility], [place] 06/21/21 1454    06/23/21, MD 06/21/21 1511

## 2021-06-21 NOTE — Discharge Instructions (Signed)
Substance Abuse Resources ? ? ?[name] ?- Admissions are currently completed Monday through Friday at 8am; both appointments and walk-ins are accepted.  Any individual that is a [place] resident may present for a substance abuse screening and assessment for admission.  A person may be referred by numerous sources or self-refer.   Potential clients will be screened for medical necessity and appropriateness for the program.  Clients must meet criteria for high-intensity residential treatment services.  If clinically appropriate, a client will continue with the comprehensive clinical assessment and intake process, as well as enrollment in the [facility]. ? ?Address: [address] ?[place], [place] [address] ?Admin Hours: Mon-Fri 8AM to 5PM ?Center Hours: 24/7 ?Phone: [phone] ?Fax: [phone] ? ?[name] (Det[facility]) Facility Based Crisis:  ?These are 3 locations for services: Please call before arrival:   ? ?[name] ([name])  ?Address: [address] [place], [place] [address] ?Phone: [phone] ? ?[name] ([name]) ?Address: [address], [place], [place] [address] ?Phone#: [phone] ? ?[name] ([name]) ?Address: [address], [place], [place] [address] ?Phone#: [phone] ? ? ?[facility] ([facility] (offers outpatient therapy and intensive outpatient substance abuse therapy).  ?[address], [place], [place] [address] ?Phone: [phone] ? ?[facility] ?Men's Division ?Address: [address] [place], [place] [address] ?Phone: [phone] ? ?-The [facility] provides food, shelter and other programs and services to the homeless men of [place] through our men's program. ? ?By offering safe shelter, three meals a day, clean clothing, Biblical counseling, financial planning, vocational training, GED/education and  employment assistance, we've helped mend the shattered lives of many homeless men since opening in 1974. ? ?We have approximately 267 beds available, with a max of 312 beds including mats for emergency situations and currently house an average of 270 men a night. ? ?Prospective Client Check-In Information ?Photo ID Required (State/ Out of State/ DOC) - if photo ID is not available, clients are required to have a printout of a police/sheriff's criminal history report. ?Help out with chores around the Mission. ?No sex offender of any type (pending, charged, registered and/or any other sex related offenses) will be permitted to check in. ?Must be willing to abide by all rules, regulations, and policies established by the [facility]. ?The following will be provided - shelter, food, clothing, and biblical counseling. ?If you or someone you know is in need of assistance at our men's shelter in [place], [place], please call [phone] ext. [phone]. ? ?[facility] for [facility] intake hours are Monday-Friday only.  ? ?[facility]: ? ? Phone: [phone] ? ?[facility] ?SA Intensive Outpatient Program (SAIOP) means structured individual and group addiction activities and services that are provided at an outpatient program designed to assist adult and adolescent consumers to begin recovery and learn skills for recovery maintenance. The [facility] SAIOP program is offered at least 3 hours a day, 3 days a week. SAIOP services shall include a structured program consisting of, but not limited to, the following services: ?Individual counseling and support; Group counseling and support; Family counseling, training or support; Biochemical assays to identify recent drug use (e.g., urine drug screens); Strategies for relapse prevention to include community and social support systems in treatment; Life skills; Crisis contingency planning; Disease Management; and Treatment  support activities that have been adapted or   specifically designed for persons with physical disabilities, or persons with co-occurring disorders of mental illness and substance abuse/dependence or mental retardation/developmental disability and substance abuse/dependence.  Phone: [phone]   [facility])  Address: [address] [place], [place], [place] [address] Phone: [phone]   [facility] Address: [address] [place], [place] [address] Phone: [phone]  - a combination of group and individual sessions to meet the participants needs. This allows participants to engage in treatment and remain involved in their home and work life. - Transitional housing places program participants in a supportive living environment while they complete a treatment program and work to secure independent housing. - The Substance Abuse Intensive Outpatient Treatment Program at [employer] consists of structured group sessions and individual sessions that are designed to teach participants early recovery and relapse prevention skills. -[facility] works with the [employer] to provide a housing and treatment program for homeless veterans.   [facility] [employer].   Address: [address] [place], [place] [address] Phone#: [phone]   : Referrals to RTSA facilities can be made by [employer] and [facility].  Referrals are also accepted from physicians, private providers, hospital emergency rooms, family members, or any person who has knowledge of someone in the need of our services.  [facility] will also offer the following outpatient services: (Monday through Friday 8am-5pm)   Partial Hospitalization Program (PHP) Substance Abuse Intensive Outpatient Program (SA-IOP) Group Therapy Medication Management Peer Living Room We also provide (24/7):  Assessments: Our mental health  clinician and providers will conduct a focused mental health evaluation, assessing for immediate safety concerns and further mental health needs. Referral: Our team will provide resources and help connect to community based mental health treatment, when indicated, including psychotherapy, psychiatry, and other specialized behavioral health or substance use disorder services (for those not already in treatment). Transitional Care: Our team providers in person bridging and/or telephonic follow-up during the patient's transition to outpatient services.   [facility] 24-Hour Call Center: [phone] Behavioral Health Crisis Line: [phone]

## 2021-06-21 NOTE — Consult Note (Signed)
Telepsych Consultation   Reason for Consult:  Psychiatric Reassessment for Suicidal Ideations Referring Physician:  Theron Arista, PA-C Location of Patient:    [facility] Location of Provider: Other: virtual home office  Patient Identification: Jared Jordan MRN:  [id number] Principal Diagnosis: Major depressive disorder, recurrent severe without psychotic features (HCC) Diagnosis:  Principal Problem:   Major depressive disorder, recurrent severe without psychotic features (HCC) Active Problems:   Substance induced mood disorder (HCC)   Alcohol abuse   Total Time spent with patient: 30 minutes  Subjective:   Jared Jordan is a 30 y.o. male patient admitted with suicidal ideations and verbalized intent. Today he states, "I don't want to make decisions for myself because I screw it up."   Patient seen via telepsych by this provider; chart reviewed and consulted with Dr. Lucianne Muss on 06/21/21.  On evaluation Jared Jordan reports he is homeless and currently sleeping in a sleeping bag under an abandoned semi truck. States his circumstances make him depressed as he is tired of waking up "under a truck." He relates no one is trying to help him get better. He has limited protective factors, states most of his family is deceased with the exception of his mother who lives in [place], [place].  He has limited contact with her-cites consequences of his chronic drug use.   Patient was recently inpatient at [facility] on 12/10 for same concerns after he prematurely left [name] and subsequently relapsed.  [name] discharged him with a plan to start venlafaxine and follow-up for outpatient services.  He tells me today he did not do this, but instead  used heroine on 12/12.  Patient is clear and coherent today, does not appear acutely intoxicated and admission UDS was negative.  Motivational interviewing used to determine patient's fitness for change and explored road map to get there.   Ultimately reiterated the importance of taking ownership and making better choices with resources given.  Empathetic listening and positive regard offered throughout.   Patient cooperative and agrees with above.  States his commitment to getting better if resources are provided today.     AST and ALT levels are elevated, suspect this is secondary to alcohol use.  Can restart venlafaxine but low dose recommended. Bun and creatinine WNL; no leucocytosis; EKG Qt-QTC 396/406, Normal sinus rhythm with possible left atrial enlargement but no changes when compared to previous EKGs completed within the past month. He is already medically cleared.     HPI:  Per Provider Admission Assessment 06/20/2021: Chief Complaint  Patient presents with   Suicidal      Jared Jordan is a 30 y.o. male.   HPI   Patient with medical history notable for substance use disorder (heroin), depression, alcohol use disorder presents due to suicidal ideations.  He has felt this way off and on for the last year.  He does have.  Hands involving slitting his wrists.  His current plan would be to jump off a bridge.  Denies any HI, no hallucinations or delusions.  No drinks in the last week, also denies any heroin use over the last week and no withdrawal symptoms.  Promises mother he would get help, he feels hopeless.  Not currently on any medication because it does not help his symptoms.    Past Psychiatric History: substance induced mood disorder, alcohol use disorder, major depressive disorder  Risk to Self:  no Risk to Others:  no Prior Inpatient Therapy: yes  Prior Outpatient Therapy:  yes  Past  Medical History:  Past Medical History:  Diagnosis Date   Anxiety    Depression    Drug use     Past Surgical History:  Procedure Laterality Date   SHOULDER SURGERY     SHOULDER SURGERY Left 2020   Family History: History reviewed. No pertinent family history. Family Psychiatric  History: unknown Social History:   Social History   Substance and Sexual Activity  Alcohol Use Not Currently   Alcohol/week: 4.0 - 6.0 standard drinks   Types: 4 - 6 Cans of beer per week   Comment: normally, 4-6 beers weekly; last drank 2 bottles of champagne yesterday     Social History   Substance and Sexual Activity  Drug Use Not Currently   Types: IV, Heroin, Marijuana, Other-see comments   Comment: using 1gm everyday heroin - for past 3 weeks    Social History   Socioeconomic History   Marital status: Single    Spouse name: Not on file   Number of children: Not on file   Years of education: Not on file   Highest education level: Not on file  Occupational History   Not on file  Tobacco Use   Smoking status: Former    Packs/day: 0.25    Years: 1.00    Pack years: 0.25    Types: Cigarettes   Smokeless tobacco: Former    Quit date: 04/15/2021  Vaping Use   Vaping Use: Some days   Start date: 11/04/2018   Devices: Jewel Menthol  Substance and Sexual Activity   Alcohol use: Not Currently    Alcohol/week: 4.0 - 6.0 standard drinks    Types: 4 - 6 Cans of beer per week    Comment: normally, 4-6 beers weekly; last drank 2 bottles of champagne yesterday   Drug use: Not Currently    Types: IV, Heroin, Marijuana, Other-see comments    Comment: using 1gm everyday heroin - for past 3 weeks   Sexual activity: Not Currently  Other Topics Concern   Not on file  Social History Narrative   Pt is grieving the death of his father last year. 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 Lives alone and counts his mom as social support. Has siblings but doesn't speak to them.   Social Determinants of Health   Financial Resource Strain: Not on file  Food Insecurity: Not on file  Transportation Needs: Not on file  Physical Activity: Not on file  Stress: Not on file  Social Connections: Not on file   Additional Social History:    Allergies:  No Known Allergies  Labs:  Results for orders placed or  performed during the hospital encounter of 06/20/21 (from the past 48 hour(s))  Resp Panel by RT-PCR (Flu A&B, Covid) Nasopharyngeal Swab     Status: None   Collection Time: 06/20/21  6:36 PM   Specimen: Nasopharyngeal Swab; Nasopharyngeal(Jared Jordan) swabs in vial transport medium  Result Value Ref Range   SARS Coronavirus 2 by RT PCR NEGATIVE NEGATIVE    Comment: (NOTE) SARS-CoV-2 target nucleic acids are NOT DETECTED.  The SARS-CoV-2 RNA is generally detectable in upper respiratory specimens during the acute phase of infection. The lowest concentration of SARS-CoV-2 viral copies this assay can detect is 138 copies/mL. A negative result does not preclude SARS-Cov-2 infection and should not be used as the sole basis for treatment or other patient management decisions. A negative result may occur with  improper specimen collection/handling, submission of specimen other than  nasopharyngeal swab, presence of viral mutation(s) within the areas targeted by this assay, and inadequate number of viral copies(<138 copies/mL). A negative result must be combined with clinical observations, patient history, and epidemiological information. The expected result is Negative.  Fact Sheet for Patients:  BloggerCourse.com  Fact Sheet for Healthcare Providers:  SeriousBroker.it  This test is no t yet approved or cleared by the [place] FDA and  has been authorized for detection and/or diagnosis of SARS-CoV-2 by FDA under an Emergency Use Authorization (EUA). This EUA will remain  in effect (meaning this test can be used) for the duration of the COVID-19 declaration under Section 564(b)(1) of the Act, 21 U.S.C.section 360bbb-3(b)(1), unless the authorization is terminated  or revoked sooner.       Influenza A by PCR NEGATIVE NEGATIVE   Influenza B by PCR NEGATIVE NEGATIVE    Comment: (NOTE) The Xpert Xpress SARS-CoV-2/FLU/RSV plus assay is intended  as an aid in the diagnosis of influenza from Nasopharyngeal swab specimens and should not be used as a sole basis for treatment. Nasal washings and aspirates are unacceptable for Xpert Xpress SARS-CoV-2/FLU/RSV testing.  Fact Sheet for Patients: BloggerCourse.com  Fact Sheet for Healthcare Providers: SeriousBroker.it  This test is not yet approved or cleared by the [place] FDA and has been authorized for detection and/or diagnosis of SARS-CoV-2 by FDA under an Emergency Use Authorization (EUA). This EUA will remain in effect (meaning this test can be used) for the duration of the COVID-19 declaration under Section 564(b)(1) of the Act, 21 U.S.C. section 360bbb-3(b)(1), unless the authorization is terminated or revoked.  Performed at [facility], [address] [place] , [place] [address]   Comprehensive metabolic panel     Status: Abnormal   Collection Time: 06/20/21  6:36 PM  Result Value Ref Range   Sodium 135 135 - 145 mmol/L   Potassium 3.7 3.5 - 5.1 mmol/L   Chloride 101 98 - 111 mmol/L   CO2 25 22 - 32 mmol/L   Glucose, Bld 114 (H) 70 - 99 mg/dL    Comment: Glucose reference range applies only to samples taken after fasting for at least 8 hours.   BUN 7 6 - 20 mg/dL   Creatinine, Ser 5.57 0.61 - 1.24 mg/dL   Calcium 9.2 8.9 - 32.2 mg/dL   Total Protein 6.2 (L) 6.5 - 8.1 g/dL   Albumin 3.6 3.5 - 5.0 g/dL   AST 025 (H) 15 - 41 U/L   ALT 262 (H) 0 - 44 U/L   Alkaline Phosphatase 76 38 - 126 U/L   Total Bilirubin 0.5 0.3 - 1.2 mg/dL   GFR, Estimated >42 >70 mL/min    Comment: (NOTE) Calculated using the CKD-EPI Creatinine Equation (2021)    Anion gap 9 5 - 15    Comment: Performed at [facility], [address], [place], [place] [address]  Ethanol     Status: None   Collection Time: 06/20/21  6:36 PM  Result Value Ref Range   Alcohol, Ethyl (B) <10 <10 mg/dL    Comment: (NOTE) Lowest detectable  limit for serum alcohol is 10 mg/dL.  For medical purposes only. Performed at [facility], [address] [place], [place] [address]   Urine rapid drug screen (hosp performed)     Status: None   Collection Time: 06/20/21  6:36 PM  Result Value Ref Range   Opiates NONE DETECTED NONE DETECTED   Cocaine NONE DETECTED NONE  DETECTED   Benzodiazepines NONE DETECTED NONE DETECTED   Amphetamines NONE DETECTED NONE DETECTED   Tetrahydrocannabinol NONE DETECTED NONE DETECTED   Barbiturates NONE DETECTED NONE DETECTED    Comment: (NOTE) DRUG SCREEN FOR MEDICAL PURPOSES ONLY.  IF CONFIRMATION IS NEEDED FOR ANY PURPOSE, NOTIFY LAB WITHIN 5 DAYS.  LOWEST DETECTABLE LIMITS FOR URINE DRUG SCREEN Drug Class                     Cutoff (ng/mL) Amphetamine and metabolites    1000 Barbiturate and metabolites    200 Benzodiazepine                 200 Tricyclics and metabolites     300 Opiates and metabolites        300 Cocaine and metabolites        300 THC                            50 Performed at [facility], [address] [place], [place] [address]   CBC with Diff     Status: None   Collection Time: 06/20/21  6:36 PM  Result Value Ref Range   WBC 4.2 4.0 - 10.5 K/uL   RBC 4.65 4.22 - 5.81 MIL/uL   Hemoglobin 13.6 13.0 - 17.0 g/dL   HCT 52.8 41.3 - 24.4 %   MCV 86.5 80.0 - 100.0 fL   MCH 29.2 26.0 - 34.0 pg   MCHC 33.8 30.0 - 36.0 g/dL   RDW 01.0 27.2 - 53.6 %   Platelets 239 150 - 400 K/uL   nRBC 0.0 0.0 - 0.2 %   Neutrophils Relative % 64 %   Neutro Abs 2.6 1.7 - 7.7 K/uL   Lymphocytes Relative 21 %   Lymphs Abs 0.9 0.7 - 4.0 K/uL   Monocytes Relative 14 %   Monocytes Absolute 0.6 0.1 - 1.0 K/uL   Eosinophils Relative 1 %   Eosinophils Absolute 0.1 0.0 - 0.5 K/uL   Basophils Relative 0 %   Basophils Absolute 0.0 0.0 - 0.1 K/uL   Immature Granulocytes 0 %   Abs Immature Granulocytes 0.01 0.00 - 0.07 K/uL    Comment: Performed at [facility], [address] [place], [place] [address]    Medications:  Current Facility-Administered Medications  Medication Dose Route Frequency Provider Last Rate Last Admin   acetaminophen (TYLENOL) tablet 650 mg  650 mg Oral Q6H PRN Solon Augusta S, PA       Or   acetaminophen (TYLENOL) suppository 650 mg  650 mg Rectal Q6H PRN Fondaw, Wylder S, PA       polyethylene glycol (MIRALAX / GLYCOLAX) packet 17 g  17 g Oral Daily PRN Solon Augusta S, PA       traZODone (DESYREL) tablet 50 mg  50 mg Oral QHS PRN Gailen Shelter, PA       venlafaxine XR (EFFEXOR-XR) 24 hr capsule 75 mg  75 mg Oral Q breakfast Ophelia Shoulder E, Jared Jordan   75 mg at 06/21/21 1142   Current Outpatient Medications  Medication Sig Dispense Refill   venlafaxine XR (EFFEXOR-XR) 75 MG 24 hr capsule Take 1 capsule (75 mg total) by mouth daily with breakfast. 30 capsule 0    Musculoskeletal: Strength & Muscle Tone: within normal limits Gait & Station: normal Patient leans: N/A    Psychiatric Specialty Exam:  Presentation  General  Appearance: Appropriate for Environment; Fairly Groomed  Eye Contact:Fair  Speech:Clear and Coherent; Normal Rate  Speech Volume:Normal  Handedness:Right   Mood and Affect  Mood:Depressed  Affect:Appropriate; Congruent; Constricted   Thought Process  Thought Processes:Coherent; Goal Directed  Descriptions of Associations:Intact  Orientation:Full (Time, Place and Person)  Thought Content:Logical (has improved since admission)  History of Schizophrenia/Schizoaffective disorder:No  Duration of Psychotic Symptoms:No data recorded Hallucinations:Hallucinations: None  Ideas of Reference:None  Suicidal Thoughts:Suicidal Thoughts: Yes, Passive (contingent on homelessness)  Homicidal Thoughts:Homicidal Thoughts: No   Sensorium  Memory:Immediate Good; Recent Good; Remote Good  Judgment:Fair (at baseline as pt chronically abuses illicit drugs and is non-compliant with mental health  meds)  Insight:Fair   Executive Functions  Concentration:Good  Attention Span:Fair  Recall:Good  Fund of Knowledge:Good  Language:Good   Psychomotor Activity  Psychomotor Activity:Psychomotor Activity: Normal   Assets  Assets:Communication Skills; Desire for Improvement   Sleep  Sleep:Sleep: Good Number of Hours of Sleep: 8    Physical Exam: Physical Exam Constitutional:      Appearance: Normal appearance.  Cardiovascular:     Rate and Rhythm: Normal rate.     Pulses: Normal pulses.  Musculoskeletal:        General: Normal range of motion.     Cervical back: Normal range of motion.  Neurological:     General: No focal deficit present.     Mental Status: He is alert and oriented to person, place, and time.  Psychiatric:        Attention and Perception: Attention and perception normal.        Mood and Affect: Mood is anxious and depressed.        Speech: Speech normal.        Behavior: Behavior normal. Behavior is cooperative.        Thought Content: Thought content includes suicidal (passive, now denies plan to intent) ideation.   Review of Systems  Constitutional: Negative.   HENT: Negative.    Eyes: Negative.   Respiratory: Negative.    Cardiovascular: Negative.   Gastrointestinal: Negative.   Genitourinary: Negative.   Skin:        Nickel sized ulceration noted to left cheek area  Neurological: Negative.  Negative for dizziness, tingling, tremors and headaches.  Endo/Heme/Allergies: Negative.   Psychiatric/Behavioral:  Positive for depression and substance abuse. Negative for hallucinations and suicidal ideas. The patient is nervous/anxious.   Blood pressure 119/88, pulse 82, temperature 97.7 F (36.5 C), temperature source Oral, resp. rate 20, height 5\' 10"  (1.778 m), weight 82 kg, SpO2 100 %. Body mass index is 25.94 kg/m.  Treatment Plan Summary: Plan- As per above assessment, there are no current grounds for involuntary commitment at this  time.  Patient contracts for safety and reports he no longer has a desire for self harm.    Patient is not currently interested in inpatient hospitalization services but expresses agreement to continue residential treatment.  Patient potentially accepted to [facility] for SUD residential treatment.  I have asked [facility], RN to assist in Jared Jordan for transportation.  I have reviewed importance of substance abuse abstinence, potential negative impact substance abuse can have on his relationships and level of functioning, and importance of medication compliance.   He should continue venlafaxine 75mg  po daily for mood and Gabapentin 200mg  po TID mood/ potential withdrawal sx  Above discussed with patient concordance.    Disposition: No evidence of imminent risk to self or others at present.   Patient does  not meet criteria for psychiatric inpatient admission. Supportive therapy provided about ongoing stressors. Discussed crisis plan, support from social network, calling 911, coming to the Emergency Department, and calling Suicide Hotline.  This service was provided via telemedicine using a 2-way, interactive audio and video technology.  Names of all persons participating in this telemedicine service and their role in this encounter. Name: Jared Jordan Role: Patient  Name: Ophelia Shoulder Role: PMHNP     Jared Abrahams, Jared Jordan 06/21/2021 11:53 AM

## 2021-06-21 NOTE — ED Notes (Signed)
Safe transport called 

## 2021-06-21 NOTE — ED Notes (Signed)
Patient given his belongings; paperwork signed.

## 2021-06-21 NOTE — ED Notes (Signed)
Patient leaving with safe transport to [facility] in [place].

## 2021-06-21 NOTE — ED Notes (Signed)
TTS in progress 

## 2021-06-21 NOTE — Progress Notes (Signed)
CSW contacted [facility] in [place], spoke with Asher Muir, who advised that there are currently 6 beds available for treatment. CSW notified the provider to discuss with the patient treatment options available.   Crissie Reese, MSW, LCSW-A, LCAS-A Phone: [phone] Disposition/TOC

## 2021-06-21 NOTE — Progress Notes (Signed)
CSW provided the following resources for the patient to utilize to seek treatment:   Substance Abuse Resources   [name] - Admissions are currently completed Monday through Friday at 8am; both appointments and walk-ins are accepted.  Any individual that is a [facility] resident may present for a substance abuse screening and assessment for admission.  A person may be referred by numerous sources or self-refer.   Potential clients will be screened for medical necessity and appropriateness for the program.  Clients must meet criteria for high-intensity residential treatment services.  If clinically appropriate, a client will continue with the comprehensive clinical assessment and intake process, as well as enrollment in the [facility].  Address: [address] [place], [place] [address] Admin Hours: Mon-Fri 8AM to [facility] Center Hours: 24/7 Phone: [phone] Fax: [phone]  [name]) Facility Based Crisis:  These are 3 locations for services: Please call before arrival:    [facility] Recovery Facility Based Crisis [facility])  Address: [address] [place], [place] [address] Phone: [phone]  [facility] Recovery Facility Based Crisis [facility]) Address: [address] [place], [place] [address] Phone#: [phone]  [facility] Recovery Facility Based Crisis [facility]) Address: [address] [place], [place] [address] Phone#: [phone]   [facility]): (offers outpatient therapy and intensive outpatient substance abuse therapy).  [address] [place], [place] [address] Phone: [phone]  [facility] Address: [address] [place], [place] [address] Phone: [phone]  -[facility] provides food, shelter and other programs and services to the homeless men of [place] through our [employer] program.  By offering safe shelter, three meals a day, clean  clothing, Biblical counseling, financial planning, vocational training, GED/education and employment assistance, we've helped mend the shattered lives of many homeless men since opening in [place].  We have approximately 267 beds available, with a max of 312 beds including mats for emergency situations and currently house an average of 270 men a night.  Prospective Client Check-In Information Photo ID Required (State/ Out of State/ [facility]) - if photo ID is not available, clients are required to have a printout of a police/sheriff's criminal history report. Help out with chores around the [place]. No sex offender of any type (pending, charged, registered and/or any other sex related offenses) will be permitted to check in. Must be willing to abide by all rules, regulations, and policies established by the [employer]. The following will be provided - shelter, food, clothing, and biblical counseling. If you or someone you know is in need of assistance at our [facility] shelter in [place], [place], please call [phone] ext. [phone].  [facility] for [employer] hours are Monday-Friday only.   [facility]:   Phone: [phone]  The [facility] Intensive Outpatient Program [facility]) means structured individual and group addiction activities and services that are provided at an outpatient program designed to assist adult and adolescent consumers to begin recovery and learn skills for recovery maintenance. The [employer] SAIOP program is offered at least 3 hours a day, 3 days a week. SAIOP services shall include a structured program consisting of, but not limited to, the following services: Individual counseling and support; Group counseling and support; Family counseling, training or support; Biochemical assays to identify recent drug use (e.g., urine drug screens); Strategies for relapse prevention to include community and social support systems  in treatment; Life  skills; Crisis contingency planning; Disease Management; and Treatment support activities that have been adapted or specifically designed for persons with physical disabilities, or persons with co-occurring disorders of mental illness and substance abuse/dependence or mental retardation/developmental disability and substance abuse/dependence.  Phone: [phone]   [facility])  Address: [address] [place], [place], [place] [address] Phone: [phone]   [facility] Address: [address] [place], [place] [address] Phone: [phone]  - a combination of group and individual sessions to meet the participants needs. This allows participants to engage in treatment and remain involved in their home and work life. - Transitional housing places program participants in a supportive living environment while they complete a treatment program and work to secure independent housing. - The Substance Abuse Intensive Outpatient Treatment Program at [employer] consists of structured group sessions and individual sessions that are designed to teach participants early recovery and relapse prevention skills. -[facility] works with the [employer] to provide a housing and treatment program for homeless veterans.   [facility] [employer].   Address: [address] [place], [place] [address] Phone#: [phone]   : Referrals to RTSA facilities can be made by [employer] and [facility].  Referrals are also accepted from physicians, private providers, hospital emergency rooms, family members, or any person who has knowledge of someone in the need of our services.  [facility] will also offer the following outpatient services: (Monday through Friday 8am-5pm)   Partial Hospitalization Program (PHP) Substance Abuse Intensive Outpatient Program (SA-IOP) Group Therapy Medication  Management Peer Living Room We also provide (24/7):  Assessments: Our mental health clinician and providers will conduct a focused mental health evaluation, assessing for immediate safety concerns and further mental health needs. Referral: Our team will provide resources and help connect to community based mental health treatment, when indicated, including psychotherapy, psychiatry, and other specialized behavioral health or substance use disorder services (for those not already in treatment). Transitional Care: Our team providers in person bridging and/or telephonic follow-up during the patient's transition to outpatient services.   [facility] 24-Hour Call Center: [phone] Behavioral Health Crisis Line: [phone]  Crissie Reese, MSW, LCSW-A, [place] Phone: [phone] Disposition/TOC

## 2021-06-21 NOTE — Consult Note (Signed)
Sent message to Pollie Friar requesting to see this patient for psychiatry reassessment.  Awaiting response.

## 2021-06-22 ENCOUNTER — Telehealth (HOSPITAL_COMMUNITY): Payer: Self-pay

## 2021-06-22 NOTE — BH Assessment (Signed)
Care Management - Follow Up [facility] Discharges   Patient has been placed in an inpatient facility [facility] in [place]) on 06-21-2021.

## 2021-06-25 ENCOUNTER — Encounter: Payer: Medicaid Other | Admitting: Infectious Diseases

## 2023-09-21 IMAGING — CT CT MAXILLOFACIAL W/O CM
3 series · 15 of 47 positions shown, 18 images · non-contrast
Comparison: 03/19/2021

CLINICAL DATA: Fall last night, struck face on wall, laceration to
nose and left cheek

EXAM:
CT HEAD WITHOUT CONTRAST
CT MAXILLOFACIAL WITHOUT CONTRAST
CT CERVICAL SPINE WITHOUT CONTRAST
TECHNIQUE: Multidetector CT imaging of the head, cervical spine, and
maxillofacial structures were performed using the standard protocol
without intravenous contrast. Multiplanar CT image reconstructions
of the cervical spine and maxillofacial structures were also
generated.

[Series 3: max soft · axial · 0.37mm/px · z∈[+1444,+1614]mm · 9 of 99 slices shown, 12 images]
[im 7/99  brain]
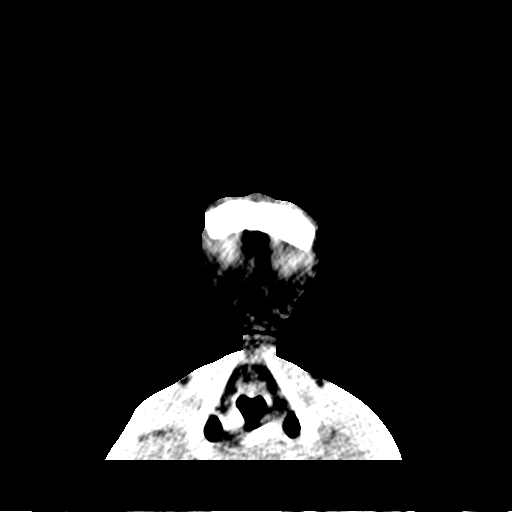
[im 7/99  bone]
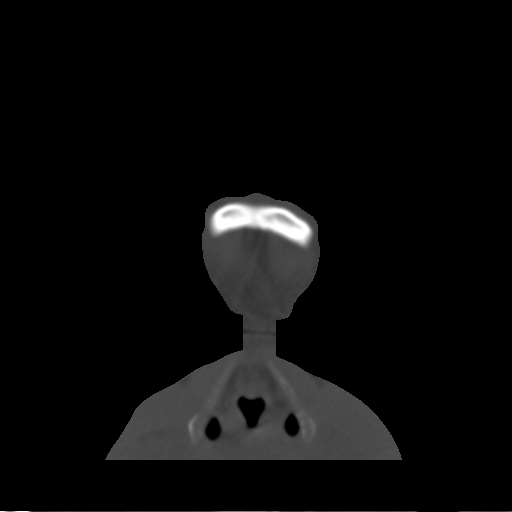
[im 17/99  bone]
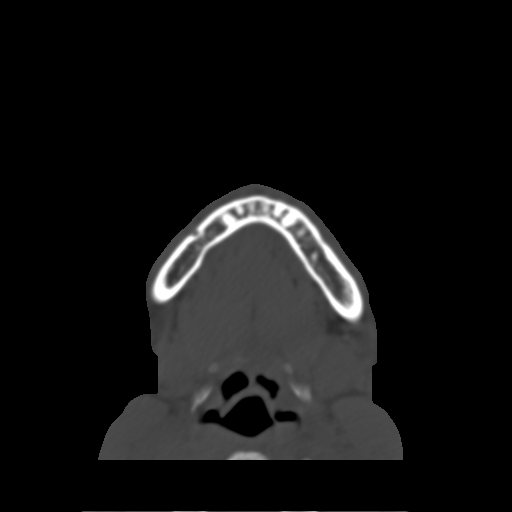
[im 28/99  bone]
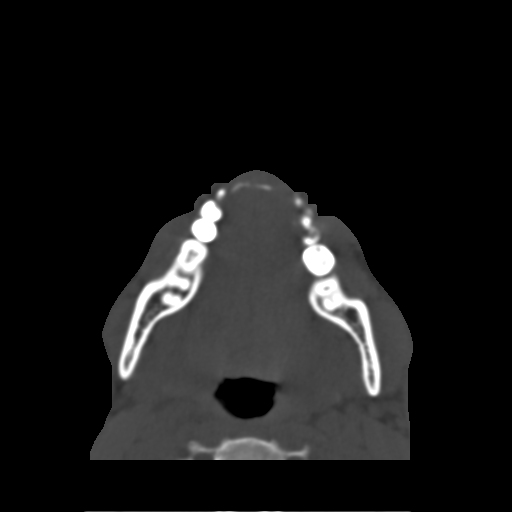
[im 38/99  bone]
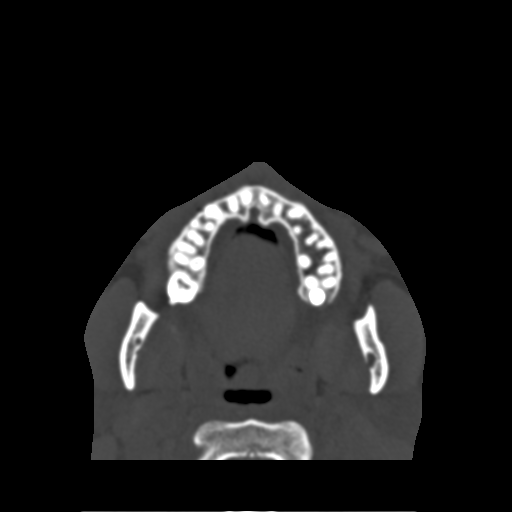
[im 51/99  brain]
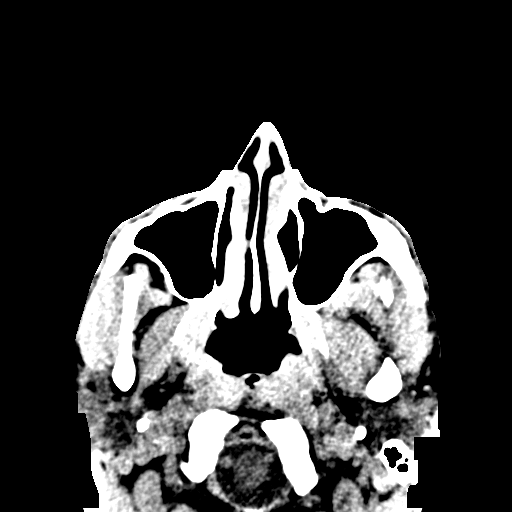
[im 51/99  bone]
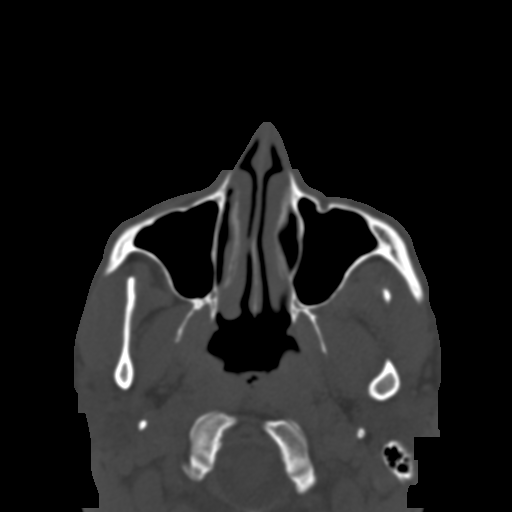
[im 61/99  bone]
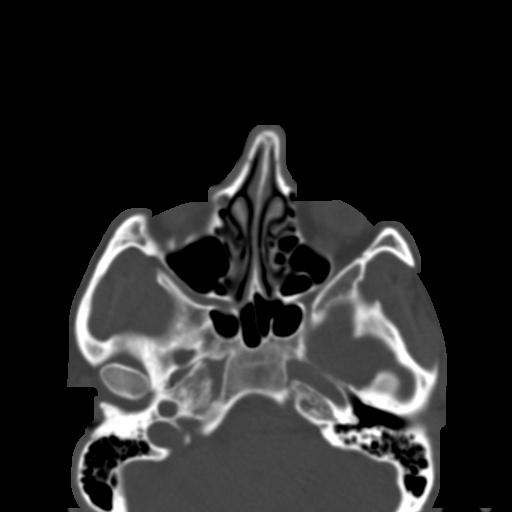
[im 71/99  bone]
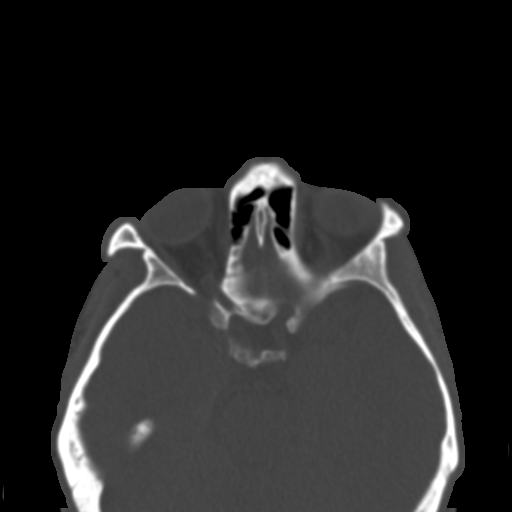
[im 82/99  bone]
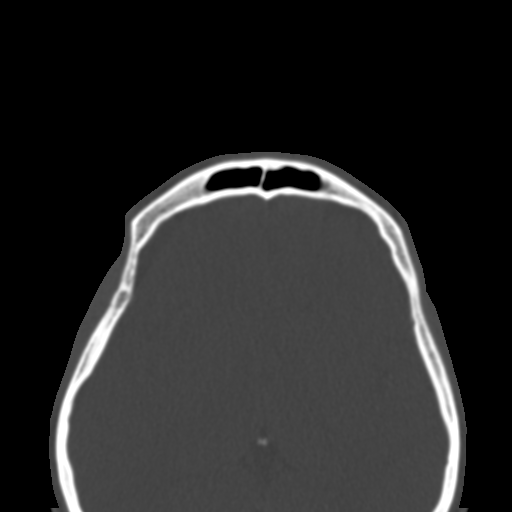
[im 92/99  brain]
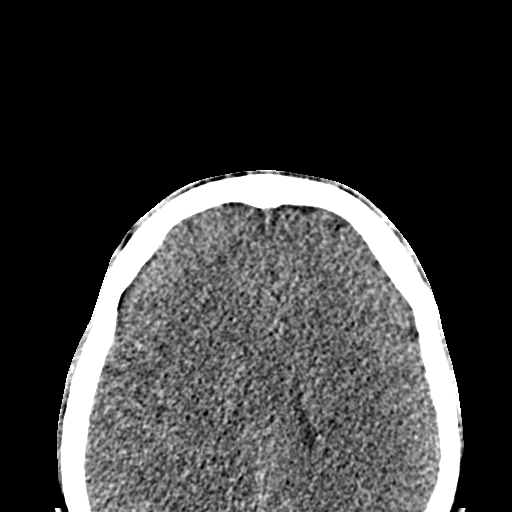
[im 92/99  bone]
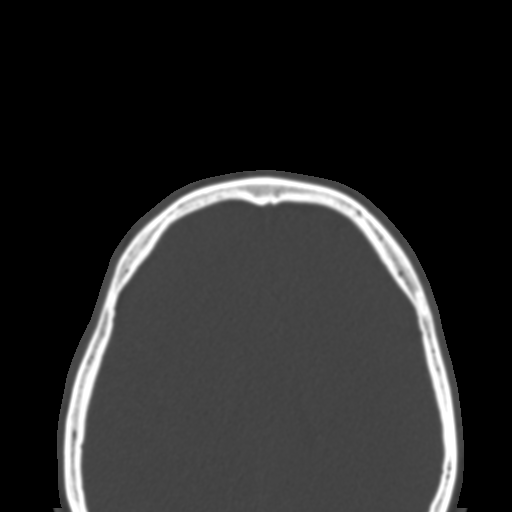

[Series 7: coronal soft · coronal · 0.39mm/px · 3 of 79 slices shown]
[im 27/79  bone]
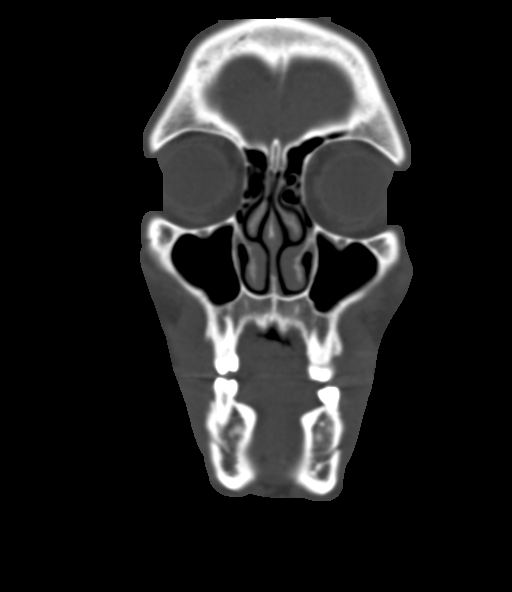
[im 35/79  bone]
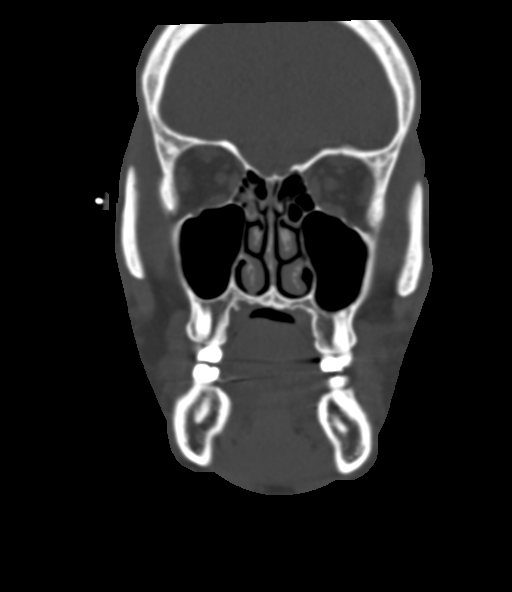
[im 44/79  bone]
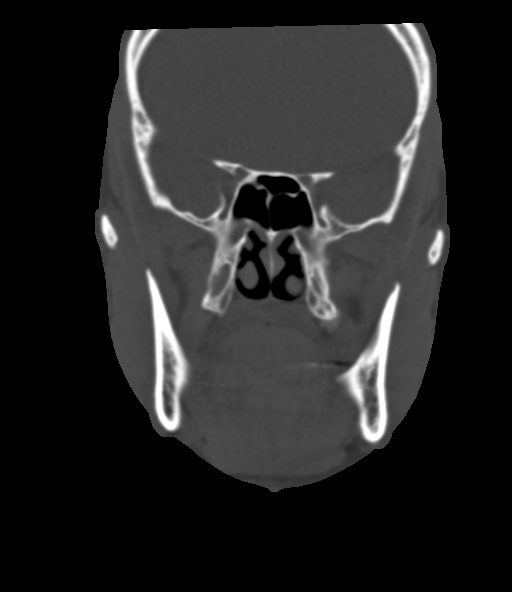

[Series 8: sagittal soft · sagittal · 0.33mm/px · 3 of 102 slices shown]
[im 34/102  bone]
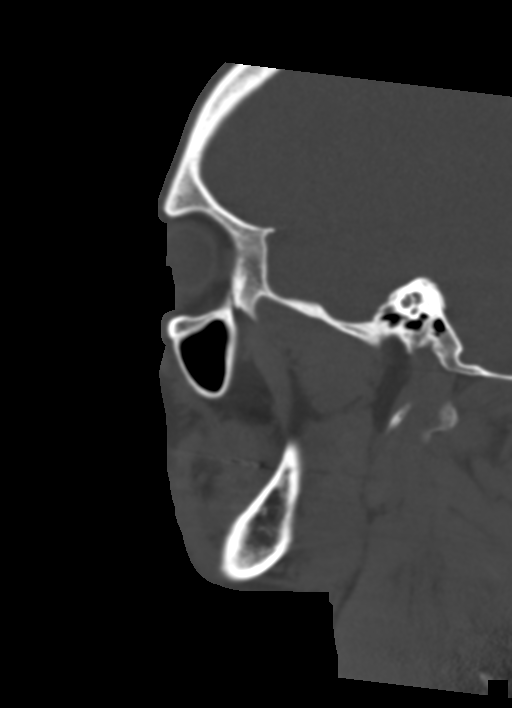
[im 51/102  bone]
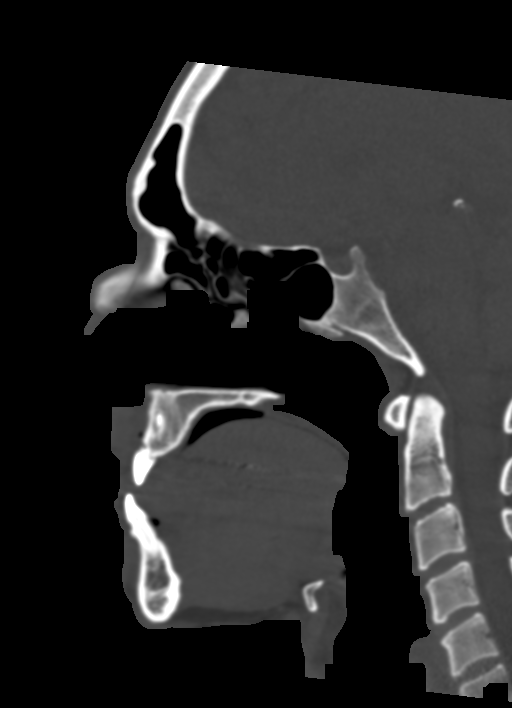
[im 68/102  bone]
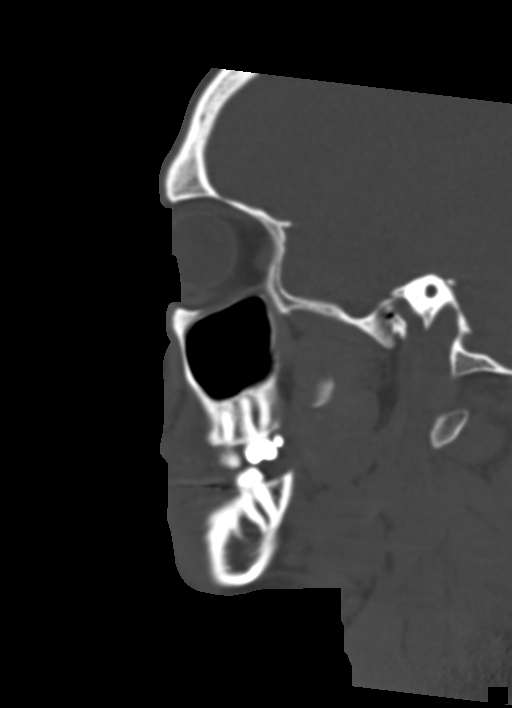

[15 of 47 positions shown; findings below may reference images not displayed]

FINDINGS: CT HEAD FINDINGS

Brain: No evidence of acute infarction, hemorrhage, hydrocephalus,
extra-axial collection or mass lesion/mass effect.

Vascular: No hyperdense vessel or unexpected calcification.

CT FACIAL BONES FINDINGS

Skull: Normal. Negative for fracture or focal lesion.

Facial bones: No displaced fractures or dislocations.

Sinuses/Orbits: No acute finding.

Other: None.

CT CERVICAL SPINE FINDINGS

Alignment: Normal.

Skull base and vertebrae: No acute fracture. No primary bone lesion
or focal pathologic process.

Soft tissues and spinal canal: No prevertebral fluid or swelling. No
visible canal hematoma.

Disc levels:  Intact.

Upper chest: Negative.

Other: None.
IMPRESSION: 1. No acute intracranial pathology.
2. No displaced fracture or dislocation of the facial bones.
3. No fracture or static subluxation of the cervical spine.
